# Patient Record
Sex: Male | Born: 1950 | Race: White | Hispanic: No | Marital: Single | Smoking: Former smoker
Health system: Southern US, Community
[De-identification: ages and names within clinical notes are randomized; demographics above are authoritative.]

## PROBLEM LIST (undated history)

## (undated) DIAGNOSIS — K219 Gastro-esophageal reflux disease without esophagitis: Secondary | ICD-10-CM

## (undated) DIAGNOSIS — K3189 Other diseases of stomach and duodenum: Secondary | ICD-10-CM

## (undated) DIAGNOSIS — Z720 Tobacco use: Secondary | ICD-10-CM

## (undated) DIAGNOSIS — K635 Polyp of colon: Secondary | ICD-10-CM

## (undated) DIAGNOSIS — D696 Thrombocytopenia, unspecified: Secondary | ICD-10-CM

## (undated) DIAGNOSIS — D649 Anemia, unspecified: Secondary | ICD-10-CM

## (undated) DIAGNOSIS — R519 Headache, unspecified: Secondary | ICD-10-CM

## (undated) DIAGNOSIS — K7460 Unspecified cirrhosis of liver: Secondary | ICD-10-CM

## (undated) DIAGNOSIS — I471 Supraventricular tachycardia, unspecified: Secondary | ICD-10-CM

## (undated) DIAGNOSIS — F1010 Alcohol abuse, uncomplicated: Secondary | ICD-10-CM

## (undated) DIAGNOSIS — E876 Hypokalemia: Secondary | ICD-10-CM

## (undated) DIAGNOSIS — Z9289 Personal history of other medical treatment: Secondary | ICD-10-CM

## (undated) DIAGNOSIS — R51 Headache: Secondary | ICD-10-CM

## (undated) DIAGNOSIS — K5520 Angiodysplasia of colon without hemorrhage: Secondary | ICD-10-CM

## (undated) DIAGNOSIS — K922 Gastrointestinal hemorrhage, unspecified: Secondary | ICD-10-CM

## (undated) DIAGNOSIS — I10 Essential (primary) hypertension: Secondary | ICD-10-CM

## (undated) DIAGNOSIS — M1990 Unspecified osteoarthritis, unspecified site: Secondary | ICD-10-CM

## (undated) DIAGNOSIS — Z8719 Personal history of other diseases of the digestive system: Secondary | ICD-10-CM

## (undated) DIAGNOSIS — J449 Chronic obstructive pulmonary disease, unspecified: Secondary | ICD-10-CM

## (undated) DIAGNOSIS — K5730 Diverticulosis of large intestine without perforation or abscess without bleeding: Secondary | ICD-10-CM

## (undated) DIAGNOSIS — I5030 Unspecified diastolic (congestive) heart failure: Secondary | ICD-10-CM

## (undated) DIAGNOSIS — K648 Other hemorrhoids: Secondary | ICD-10-CM

## (undated) DIAGNOSIS — K766 Portal hypertension: Secondary | ICD-10-CM

## (undated) HISTORY — PX: HERNIA REPAIR: SHX51

## (undated) HISTORY — PX: CATARACT EXTRACTION: SUR2

---

## 2008-12-09 ENCOUNTER — Encounter: Admission: RE | Admit: 2008-12-09 | Discharge: 2008-12-09 | Payer: Self-pay | Admitting: Family Medicine

## 2008-12-17 ENCOUNTER — Encounter: Admission: RE | Admit: 2008-12-17 | Discharge: 2008-12-17 | Payer: Self-pay | Admitting: Orthopedic Surgery

## 2010-08-29 ENCOUNTER — Ambulatory Visit (HOSPITAL_COMMUNITY): Admission: RE | Admit: 2010-08-29 | Discharge: 2010-08-29 | Payer: Self-pay | Admitting: General Surgery

## 2010-12-24 ENCOUNTER — Encounter: Payer: Self-pay | Admitting: Family Medicine

## 2011-02-15 LAB — CBC
HCT: 49.5 % (ref 39.0–52.0)
Hemoglobin: 17.2 g/dL — ABNORMAL HIGH (ref 13.0–17.0)
MCH: 32.8 pg (ref 26.0–34.0)
MCHC: 34.7 g/dL (ref 30.0–36.0)
RDW: 12.8 % (ref 11.5–15.5)

## 2011-02-15 LAB — BASIC METABOLIC PANEL
BUN: 12 mg/dL (ref 6–23)
CO2: 28 mEq/L (ref 19–32)
Calcium: 9.6 mg/dL (ref 8.4–10.5)
Glucose, Bld: 97 mg/dL (ref 70–99)
Potassium: 4.3 mEq/L (ref 3.5–5.1)
Sodium: 138 mEq/L (ref 135–145)

## 2011-02-15 LAB — SURGICAL PCR SCREEN: Staphylococcus aureus: NEGATIVE

## 2015-07-11 ENCOUNTER — Encounter (HOSPITAL_COMMUNITY): Payer: Self-pay | Admitting: Emergency Medicine

## 2015-07-11 ENCOUNTER — Other Ambulatory Visit: Payer: Self-pay | Admitting: Cardiology

## 2015-07-11 ENCOUNTER — Emergency Department (HOSPITAL_COMMUNITY): Payer: Medicare Other

## 2015-07-11 ENCOUNTER — Inpatient Hospital Stay (HOSPITAL_COMMUNITY)
Admission: EM | Admit: 2015-07-11 | Discharge: 2015-07-17 | DRG: 292 | Disposition: A | Discharge status: Home / Self Care | Payer: Medicare Other | Attending: Cardiology | Admitting: Cardiology

## 2015-07-11 DIAGNOSIS — I471 Supraventricular tachycardia, unspecified: Secondary | ICD-10-CM | POA: Diagnosis present

## 2015-07-11 DIAGNOSIS — IMO0002 Reserved for concepts with insufficient information to code with codable children: Secondary | ICD-10-CM | POA: Diagnosis present

## 2015-07-11 DIAGNOSIS — K648 Other hemorrhoids: Secondary | ICD-10-CM | POA: Diagnosis present

## 2015-07-11 DIAGNOSIS — Z87891 Personal history of nicotine dependence: Secondary | ICD-10-CM | POA: Diagnosis not present

## 2015-07-11 DIAGNOSIS — D124 Benign neoplasm of descending colon: Secondary | ICD-10-CM | POA: Diagnosis present

## 2015-07-11 DIAGNOSIS — K5520 Angiodysplasia of colon without hemorrhage: Secondary | ICD-10-CM

## 2015-07-11 DIAGNOSIS — M79672 Pain in left foot: Secondary | ICD-10-CM

## 2015-07-11 DIAGNOSIS — R609 Edema, unspecified: Secondary | ICD-10-CM | POA: Diagnosis not present

## 2015-07-11 DIAGNOSIS — K7460 Unspecified cirrhosis of liver: Secondary | ICD-10-CM

## 2015-07-11 DIAGNOSIS — K766 Portal hypertension: Secondary | ICD-10-CM | POA: Diagnosis present

## 2015-07-11 DIAGNOSIS — K219 Gastro-esophageal reflux disease without esophagitis: Secondary | ICD-10-CM | POA: Diagnosis present

## 2015-07-11 DIAGNOSIS — K5730 Diverticulosis of large intestine without perforation or abscess without bleeding: Secondary | ICD-10-CM | POA: Diagnosis present

## 2015-07-11 DIAGNOSIS — I509 Heart failure, unspecified: Secondary | ICD-10-CM

## 2015-07-11 DIAGNOSIS — D500 Iron deficiency anemia secondary to blood loss (chronic): Secondary | ICD-10-CM | POA: Diagnosis present

## 2015-07-11 DIAGNOSIS — E876 Hypokalemia: Secondary | ICD-10-CM | POA: Diagnosis present

## 2015-07-11 DIAGNOSIS — R0600 Dyspnea, unspecified: Secondary | ICD-10-CM | POA: Diagnosis not present

## 2015-07-11 DIAGNOSIS — D125 Benign neoplasm of sigmoid colon: Secondary | ICD-10-CM | POA: Diagnosis present

## 2015-07-11 DIAGNOSIS — I10 Essential (primary) hypertension: Secondary | ICD-10-CM | POA: Diagnosis present

## 2015-07-11 DIAGNOSIS — K7031 Alcoholic cirrhosis of liver with ascites: Secondary | ICD-10-CM | POA: Diagnosis present

## 2015-07-11 DIAGNOSIS — K3189 Other diseases of stomach and duodenum: Secondary | ICD-10-CM | POA: Diagnosis present

## 2015-07-11 DIAGNOSIS — I5033 Acute on chronic diastolic (congestive) heart failure: Principal | ICD-10-CM | POA: Diagnosis present

## 2015-07-11 DIAGNOSIS — F17200 Nicotine dependence, unspecified, uncomplicated: Secondary | ICD-10-CM | POA: Diagnosis present

## 2015-07-11 DIAGNOSIS — J449 Chronic obstructive pulmonary disease, unspecified: Secondary | ICD-10-CM | POA: Diagnosis present

## 2015-07-11 DIAGNOSIS — D696 Thrombocytopenia, unspecified: Secondary | ICD-10-CM | POA: Diagnosis present

## 2015-07-11 DIAGNOSIS — M7989 Other specified soft tissue disorders: Secondary | ICD-10-CM | POA: Diagnosis not present

## 2015-07-11 DIAGNOSIS — Q2733 Arteriovenous malformation of digestive system vessel: Secondary | ICD-10-CM

## 2015-07-11 DIAGNOSIS — R0602 Shortness of breath: Secondary | ICD-10-CM | POA: Diagnosis present

## 2015-07-11 DIAGNOSIS — F1090 Alcohol use, unspecified, uncomplicated: Secondary | ICD-10-CM | POA: Diagnosis present

## 2015-07-11 HISTORY — DX: Angiodysplasia of colon without hemorrhage: K55.20

## 2015-07-11 HISTORY — DX: Supraventricular tachycardia: I47.1

## 2015-07-11 HISTORY — DX: Other hemorrhoids: K64.8

## 2015-07-11 HISTORY — DX: Diverticulosis of large intestine without perforation or abscess without bleeding: K57.30

## 2015-07-11 HISTORY — DX: Polyp of colon: K63.5

## 2015-07-11 HISTORY — DX: Tobacco use: Z72.0

## 2015-07-11 HISTORY — DX: Essential (primary) hypertension: I10

## 2015-07-11 HISTORY — DX: Portal hypertension: K76.6

## 2015-07-11 HISTORY — DX: Chronic obstructive pulmonary disease, unspecified: J44.9

## 2015-07-11 HISTORY — DX: Alcohol abuse, uncomplicated: F10.10

## 2015-07-11 HISTORY — DX: Anemia, unspecified: D64.9

## 2015-07-11 HISTORY — DX: Unspecified diastolic (congestive) heart failure: I50.30

## 2015-07-11 HISTORY — DX: Other diseases of stomach and duodenum: K31.89

## 2015-07-11 HISTORY — DX: Supraventricular tachycardia, unspecified: I47.10

## 2015-07-11 HISTORY — DX: Hypokalemia: E87.6

## 2015-07-11 HISTORY — DX: Unspecified cirrhosis of liver: K74.60

## 2015-07-11 HISTORY — DX: Gastro-esophageal reflux disease without esophagitis: K21.9

## 2015-07-11 HISTORY — DX: Thrombocytopenia, unspecified: D69.6

## 2015-07-11 LAB — CBC WITH DIFFERENTIAL/PLATELET
BASOS ABS: 0.1 10*3/uL (ref 0.0–0.1)
BASOS PCT: 1 % (ref 0–1)
BASOS PCT: 1 % (ref 0–1)
Basophils Absolute: 0.1 10*3/uL (ref 0.0–0.1)
EOS ABS: 0.2 10*3/uL (ref 0.0–0.7)
Eosinophils Absolute: 0.2 10*3/uL (ref 0.0–0.7)
Eosinophils Relative: 2 % (ref 0–5)
Eosinophils Relative: 2 % (ref 0–5)
HCT: 26.7 % — ABNORMAL LOW (ref 39.0–52.0)
HCT: 24.4 % — ABNORMAL LOW (ref 39.0–52.0)
HEMOGLOBIN: 7.3 g/dL — AB (ref 13.0–17.0)
Hemoglobin: 7.9 g/dL — ABNORMAL LOW (ref 13.0–17.0)
LYMPHS ABS: 1 10*3/uL (ref 0.7–4.0)
LYMPHS PCT: 12 % (ref 12–46)
Lymphocytes Relative: 16 % (ref 12–46)
Lymphs Abs: 1.5 10*3/uL (ref 0.7–4.0)
MCH: 20.9 pg — ABNORMAL LOW (ref 26.0–34.0)
MCH: 20.7 pg — AB (ref 26.0–34.0)
MCHC: 29.6 g/dL — AB (ref 30.0–36.0)
MCHC: 29.9 g/dL — ABNORMAL LOW (ref 30.0–36.0)
MCV: 70.6 fL — ABNORMAL LOW (ref 78.0–100.0)
MCV: 69.3 fL — ABNORMAL LOW (ref 78.0–100.0)
MONO ABS: 1.4 10*3/uL — AB (ref 0.1–1.0)
Monocytes Absolute: 1.1 10*3/uL — ABNORMAL HIGH (ref 0.1–1.0)
Monocytes Relative: 15 % — ABNORMAL HIGH (ref 3–12)
Monocytes Relative: 13 % — ABNORMAL HIGH (ref 3–12)
NEUTROS ABS: 6.1 10*3/uL (ref 1.7–7.7)
NEUTROS PCT: 72 % (ref 43–77)
Neutro Abs: 5.8 10*3/uL (ref 1.7–7.7)
Neutrophils Relative %: 66 % (ref 43–77)
PLATELETS: 153 10*3/uL (ref 150–400)
PLATELETS: 142 10*3/uL — AB (ref 150–400)
RBC: 3.78 MIL/uL — AB (ref 4.22–5.81)
RBC: 3.52 MIL/uL — ABNORMAL LOW (ref 4.22–5.81)
RDW: 18.2 % — ABNORMAL HIGH (ref 11.5–15.5)
RDW: 18.2 % — AB (ref 11.5–15.5)
WBC: 9.3 10*3/uL (ref 4.0–10.5)
WBC: 8.2 10*3/uL (ref 4.0–10.5)

## 2015-07-11 LAB — I-STAT TROPONIN, ED: TROPONIN I, POC: 0.2 ng/mL — AB (ref 0.00–0.08)

## 2015-07-11 LAB — RETICULOCYTES
RBC.: 3.77 MIL/uL — ABNORMAL LOW (ref 4.22–5.81)
RETIC COUNT ABSOLUTE: 79.2 10*3/uL (ref 19.0–186.0)
Retic Ct Pct: 2.1 % (ref 0.4–3.1)

## 2015-07-11 LAB — IRON AND TIBC
Iron: 13 ug/dL — ABNORMAL LOW (ref 45–182)
SATURATION RATIOS: 3 % — AB (ref 17.9–39.5)
TIBC: 386 ug/dL (ref 250–450)
UIBC: 373 ug/dL

## 2015-07-11 LAB — COMPREHENSIVE METABOLIC PANEL
ALT: 27 U/L (ref 17–63)
ANION GAP: 11 (ref 5–15)
AST: 54 U/L — ABNORMAL HIGH (ref 15–41)
Albumin: 2.7 g/dL — ABNORMAL LOW (ref 3.5–5.0)
Alkaline Phosphatase: 104 U/L (ref 38–126)
BILIRUBIN TOTAL: 2.1 mg/dL — AB (ref 0.3–1.2)
BUN: 7 mg/dL (ref 6–20)
CHLORIDE: 95 mmol/L — AB (ref 101–111)
CO2: 28 mmol/L (ref 22–32)
CREATININE: 1.19 mg/dL (ref 0.61–1.24)
Calcium: 8.2 mg/dL — ABNORMAL LOW (ref 8.9–10.3)
GFR calc Af Amer: >60 mL/min (ref >60)
GFR calc non Af Amer: >60 mL/min (ref >60)
Glucose, Bld: 210 mg/dL — ABNORMAL HIGH (ref 65–99)
POTASSIUM: 2.5 mmol/L — AB (ref 3.5–5.1)
SODIUM: 134 mmol/L — AB (ref 135–145)
Total Protein: 5.6 g/dL — ABNORMAL LOW (ref 6.5–8.1)

## 2015-07-11 LAB — CREATININE, SERUM
Creatinine, Ser: 1.04 mg/dL (ref 0.61–1.24)
GFR calc non Af Amer: >60 mL/min (ref >60)

## 2015-07-11 LAB — FERRITIN: Ferritin: 20 ng/mL — ABNORMAL LOW (ref 24–336)

## 2015-07-11 LAB — BASIC METABOLIC PANEL
Anion gap: 15 (ref 5–15)
BUN: 8 mg/dL (ref 6–20)
CHLORIDE: 96 mmol/L — AB (ref 101–111)
CO2: 25 mmol/L (ref 22–32)
CREATININE: 1.05 mg/dL (ref 0.61–1.24)
Calcium: 8.5 mg/dL — ABNORMAL LOW (ref 8.9–10.3)
GFR calc Af Amer: >60 mL/min (ref >60)
GFR calc non Af Amer: >60 mL/min (ref >60)
Glucose, Bld: 122 mg/dL — ABNORMAL HIGH (ref 65–99)
Potassium: 3.3 mmol/L — ABNORMAL LOW (ref 3.5–5.1)
Sodium: 136 mmol/L (ref 135–145)

## 2015-07-11 LAB — BRAIN NATRIURETIC PEPTIDE
B Natriuretic Peptide: 327 pg/mL — ABNORMAL HIGH (ref 0.0–100.0)
B Natriuretic Peptide: 331.3 pg/mL — ABNORMAL HIGH (ref 0.0–100.0)

## 2015-07-11 LAB — FOLATE: FOLATE: 11.1 ng/mL (ref >5.9)

## 2015-07-11 LAB — VITAMIN B12: Vitamin B-12: 672 pg/mL (ref 180–914)

## 2015-07-11 LAB — TSH: TSH: 1.984 u[IU]/mL (ref 0.350–4.500)

## 2015-07-11 MED ORDER — SODIUM CHLORIDE 0.9 % IJ SOLN: 3.0000 mL | INTRAMUSCULAR | Status: DC | PRN

## 2015-07-11 MED ORDER — POTASSIUM CHLORIDE ER 10 MEQ PO TBCR
40.0000 meq | EXTENDED_RELEASE_TABLET | ORAL | Status: AC
  Administered 2015-07-11 – 2015-07-12 (×2): 40 meq via ORAL
  Filled 2015-07-11 (×2): qty 4

## 2015-07-11 MED ORDER — PNEUMOCOCCAL VAC POLYVALENT 25 MCG/0.5ML IJ INJ
0.5000 mL | INJECTION | INTRAMUSCULAR | Status: AC
Start: 2015-07-12 — End: 2015-07-12
  Administered 2015-07-12: 0.5 mL via INTRAMUSCULAR
  Filled 2015-07-11: qty 0.5

## 2015-07-11 MED ORDER — ALUM & MAG HYDROXIDE-SIMETH 200-200-20 MG/5ML PO SUSP: 15.0000 mL | Freq: Four times a day (QID) | ORAL | Status: DC | PRN

## 2015-07-11 MED ORDER — SODIUM CHLORIDE 0.9 % IJ SOLN
3.0000 mL | Freq: Two times a day (BID) | INTRAMUSCULAR | Status: DC
  Administered 2015-07-11 – 2015-07-16 (×11): 3 mL via INTRAVENOUS

## 2015-07-11 MED ORDER — ENOXAPARIN SODIUM 40 MG/0.4ML ~~LOC~~ SOLN
40.0000 mg | SUBCUTANEOUS | Status: DC
  Administered 2015-07-11 – 2015-07-13 (×3): 40 mg via SUBCUTANEOUS
  Filled 2015-07-11 (×4): qty 0.4

## 2015-07-11 MED ORDER — FUROSEMIDE 10 MG/ML IJ SOLN
40.0000 mg | Freq: Two times a day (BID) | INTRAMUSCULAR | Status: DC
  Administered 2015-07-11 – 2015-07-13 (×5): 40 mg via INTRAVENOUS
  Filled 2015-07-11 (×6): qty 4

## 2015-07-11 MED ORDER — POTASSIUM CHLORIDE 10 MEQ/100ML IV SOLN
10.0000 meq | INTRAVENOUS | Status: AC
  Administered 2015-07-11 – 2015-07-12 (×2): 10 meq via INTRAVENOUS
  Filled 2015-07-11 (×2): qty 100

## 2015-07-11 MED ORDER — ACETAMINOPHEN 325 MG PO TABS: 650.0000 mg | ORAL_TABLET | ORAL | Status: DC | PRN

## 2015-07-11 MED ORDER — SODIUM CHLORIDE 0.9 % IV SOLN: 250.0000 mL | INTRAVENOUS | Status: DC | PRN

## 2015-07-11 MED ORDER — ONDANSETRON HCL 4 MG/2ML IJ SOLN: 4.0000 mg | Freq: Four times a day (QID) | INTRAMUSCULAR | Status: DC | PRN

## 2015-07-11 NOTE — Progress Notes (Signed)
CRITICAL VALUE ALERT  Critical value received:  Potassium=2.5  Date of notification:  07/11/15  Time of notification:  22:25  Critical value read back: yes  Nurse who received alert: G. Cecil Vandyke  MD notified (1st page):  Dr. Alejandro Mulling  Time of first page: 22:35  MD notified (2nd page):  Time of second page:  Responding MD:  Dr. Claiborne Billings  Time MD responded:  22:50

## 2015-07-11 NOTE — Progress Notes (Signed)
X-Cover  Called with potassium of 2.5 in setting of quick diuresis (1+ L output from ER). With creatinine 1.1. Will give 40 meq PO x2 (80 total meq) and 20 meq IV over 2 hours for 100 meq total. Then repeat BMP. Anticipate will need further potassium. He's having some runs of SVT/tachycardia.   Jules Husbands, MD

## 2015-07-11 NOTE — ED Notes (Signed)
Dr Wynonia Lawman called and sts saw pt in office and is aware of troponin; EDP aware

## 2015-07-11 NOTE — H&P (Signed)
John Warren  Date of visit:  07/11/2015 DOB:  08-28-1951    Age:  64 yrs. Medical record number:  [id number]     Account number:  [id number] Primary Care Provider: Claris Gower ____________________________ CURRENT DIAGNOSES  1. Dyspnea  2. Chest pain  3. Dyspnea, unspecified ____________________________ ALLERGIES  Penicillins, Intolerance-unknown ____________________________ MEDICATIONS  1. furosemide 80 mg tablet, 1 p.o. daily  2. Nitrostat 0.4 mg sublingual tablet, PRN  3. Klor-Con M20 mEq tablet,extended release, 1 p.o. daily  4. omeprazole 40 mg capsule,delayed release, 1 p.o. daily ____________________________ CHIEF COMPLAINTS  Dyspnea  ankle edema ____________________________ HISTORY OF PRESENT ILLNESS  This 64 year old male is seen for evaluation of dyspnea and ankle edema. The patient has a several week history of progressive dyspnea with exertion. He previously was able to cut his grass but he has been unable to work or exercise and is dyspneic with less than a level of activity. He began to have edema as well as PND and saw his primary doctor last week. He was found to have a hemoglobin of 7.2 and was placed on furosemide. The patient has also noted some melena and some intermittent blood in his stools. He has lost about 30 pounds of weight according to him. He has also had some chest discomfort sometimes with exertion sometimes not. He was seen in the office today and was noted to be severely dyspneic and tachycardic with significant edema despite being on outpatient diuretics and is brought into the hospital for further evaluation of the above complaints. ____________________________ PAST HISTORY  Past Medical Illnesses:  hypertension, GERD;  Cardiovascular Illnesses:  no previous history of cardiac disease.;  Surgical Procedures:  cataract extraction OU, inguinal herniorrhaphy-rt;  Cardiology Procedures-Invasive:  no history of prior cardiac procedures;  Cardiology  Procedures-Noninvasive:  treadmill;  LVEF not documented,   ____________________________ CARDIO-PULMONARY TEST DATES EKG Date:  07/11/2015;   ____________________________ FAMILY HISTORY Father -- Father dead, Pulmonary embolism Mother -- Mother dead, Malignant neoplasm of liver Sister -- Sister alive with problem ____________________________ SOCIAL HISTORY Alcohol Use:  1pt whiskey qd 3 beer q week;  Smoking:  used to smoke but quit 2009;  Diet:  regular diet;  Lifestyle:  divorced;  Exercise:  no regular exercise;  Occupation:  retired Games developer;  Residence:  lives with girlfriend;   ____________________________ REVIEW OF SYSTEMS General:  obesity, weight loss of approximately 30 lbs  Integumentary:easy bruisability Eyes: cataract extraction bilaterally, denies diplopia, glaucoma or visual field defects. Ears, Nose, Throat, Mouth:  denies any hearing loss, epistaxis, hoarseness or difficulty speaking. Respiratory: dyspnea with exertion Cardiovascular:  please review HPI Abdominal: melena Genitourinary-Male: nocturia  Musculoskeletal:  denies arthritis, venous insufficiency, or muscle weakness Neurological:  numnbess and tingling of hand Psychiatric:  denies depression or anxiety  ____________________________ PHYSICAL EXAMINATION VITAL SIGNS  Blood Pressure:  104/70 Sitting, Right arm, regular cuff  , 114/70 Standing, Right arm and regular cuff   Pulse:  114/min. Weight:  213.00 lbs. Height:  74"BMI: 27  Constitutional:  pleasant white male in no acute distress, moderately obese Skin:  warm and dry to touch, no apparent skin lesions, or masses noted. Head:  normocephalic, balding male hair pattern Eyes:  EOMS Intact, PERRLA, C and S clear, Funduscopic exam not done. ENT:  ears, nose and throat reveal no gross abnormalities.  Dentition good. Neck:  supple, without massess. No JVD, thyromegaly or carotid bruits. Carotid upstroke normal. Chest:  normal symmetry, clear to  auscultation. Cardiac:  regular  rhythm, frequent premature beats, normal S1 and S2, no S3 or S4, grade 2/6 systolic murmur Abdomen:  abdomen soft,non-tender, no masses, no hepatospenomegaly, or aneurysm noted, moderately obese Peripheral Pulses:  pedal pulses difficult to feel  due to edema Extremities & Back:  3+ edema, bilateral venous insufficiency changes present Neurological:  no gross motor or sensory deficits noted, affect appropriate, oriented x3. ____________________________ IMPRESSIONS/PLAN 1. Dyspnea and edema with clinical congestive heart failure on examination and volume overload 2. Severe anemia possible iron deficiency 3. Hypertension 4. Chest pain possible angina 5. History of reflux  Recommendations:  The patient is brought into the hospital for evaluation and treatment.  Suspect significant CHF unclear of contribution of anemia.  He will have intravenous Lasix, a chest x-ray, lab work, and we'll start with an echocardiogram. Likely will need GI consultation because of the anemia. ____________________________ TODAYS ORDERS  1. 12 Lead EKG: Today  2. Admit to hospital  see orders.                       ____________________________ Cardiology Physician:  Kerry Hough MD [facility]

## 2015-07-11 NOTE — ED Notes (Signed)
This RN advised pt of high falls risk and asked pt to not get up to urinate without staff member at bedside.  This RN came in room to fins pt OOB using urinal.  This RN asked again that pt call for atand by assist when OOB.  Pt acknowledged.

## 2015-07-11 NOTE — ED Notes (Signed)
NOTIFIED J.BRANCH ,RN @ TRIAGE OF PATIENTS LAB RESULTS OF I-STAT TROPONIN @17 :26 PM.

## 2015-07-11 NOTE — Progress Notes (Signed)
Admitted pt to rm 3E23 from ED, pt alert and oriented, denied pain at this time, oriented to room, call bell placed within reach, admission orders carried out. Will continue to monitor.

## 2015-07-11 NOTE — Progress Notes (Signed)
Pt's potassium is 2.5, pt having runs of SVT, pt resting in bed asymptomatic. Dr. Claiborne Billings notified, orders for PO and IV potassium ordered.

## 2015-07-11 NOTE — ED Provider Notes (Addendum)
Medical screening exam:  Patient sent to the emergency department while awaiting a bed for direct admission. Patient sent by cardiology for admission for evaluation of elevated troponin and swelling. Patient has admission orders written, awaiting bed availability upstairs.  Patient was evaluated. He is comfortable without complaints. Examination is unremarkable other than significant swelling, does not appear significantly dyspneic or hypoxic.  Face to face Exam: HEENT - PERRLA Lungs - CTAB Heart - RRR, no M/R/G Abd - S/NT/ND Neuro - alert, oriented x3 Musculoskeletal - significant lower extremity edema bilaterally   Plan: Patient admitted by cardiology   Orpah Greek, MD 07/11/15 [place], MD 08/29/15 [phone]

## 2015-07-11 NOTE — ED Notes (Addendum)
Pt sent here by PCP for admission but no beds available; pt with fluid retention started on lasix last week with some results; pt sts swelling in feet; pt sts SOB; pt sts anemic and having some dark stools intermittently

## 2015-07-12 ENCOUNTER — Ambulatory Visit (HOSPITAL_COMMUNITY): Payer: Medicare Other

## 2015-07-12 ENCOUNTER — Inpatient Hospital Stay (HOSPITAL_COMMUNITY): Payer: Medicare Other

## 2015-07-12 ENCOUNTER — Encounter (HOSPITAL_COMMUNITY): Payer: Self-pay | Admitting: Cardiology

## 2015-07-12 DIAGNOSIS — I5033 Acute on chronic diastolic (congestive) heart failure: Secondary | ICD-10-CM | POA: Diagnosis not present

## 2015-07-12 DIAGNOSIS — R0600 Dyspnea, unspecified: Secondary | ICD-10-CM

## 2015-07-12 LAB — BASIC METABOLIC PANEL
ANION GAP: 8 (ref 5–15)
BUN: 10 mg/dL (ref 6–20)
CHLORIDE: 99 mmol/L — AB (ref 101–111)
CO2: 28 mmol/L (ref 22–32)
CREATININE: 1.18 mg/dL (ref 0.61–1.24)
Calcium: 8.4 mg/dL — ABNORMAL LOW (ref 8.9–10.3)
GFR calc Af Amer: >60 mL/min (ref >60)
Glucose, Bld: 137 mg/dL — ABNORMAL HIGH (ref 65–99)
POTASSIUM: 3.2 mmol/L — AB (ref 3.5–5.1)
SODIUM: 135 mmol/L (ref 135–145)

## 2015-07-12 LAB — URINALYSIS, ROUTINE W REFLEX MICROSCOPIC
Bilirubin Urine: NEGATIVE
GLUCOSE, UA: NEGATIVE mg/dL
Hgb urine dipstick: NEGATIVE
KETONES UR: NEGATIVE mg/dL
Leukocytes, UA: NEGATIVE
NITRITE: NEGATIVE
PH: 7 (ref 5.0–8.0)
Protein, ur: NEGATIVE mg/dL
Specific Gravity, Urine: 1.012 (ref 1.005–1.030)
Urobilinogen, UA: 1 mg/dL (ref 0.0–1.0)

## 2015-07-12 LAB — PREPARE RBC (CROSSMATCH)

## 2015-07-12 LAB — ABO/RH: ABO/RH(D): O POS

## 2015-07-12 MED ORDER — SODIUM CHLORIDE 0.9 % IV SOLN
Freq: Once | INTRAVENOUS | Status: AC
  Administered 2015-07-12: 09:00:00 via INTRAVENOUS

## 2015-07-12 MED ORDER — POTASSIUM CHLORIDE ER 10 MEQ PO TBCR
40.0000 meq | EXTENDED_RELEASE_TABLET | ORAL | Status: AC
  Administered 2015-07-12 (×2): 40 meq via ORAL
  Filled 2015-07-12 (×2): qty 4

## 2015-07-12 MED ORDER — FERROUS SULFATE 325 (65 FE) MG PO TABS
325.0000 mg | ORAL_TABLET | Freq: Three times a day (TID) | ORAL | Status: DC
  Administered 2015-07-12 – 2015-07-13 (×5): 325 mg via ORAL
  Filled 2015-07-12 (×6): qty 1

## 2015-07-12 MED ORDER — SPIRONOLACTONE 25 MG PO TABS
25.0000 mg | ORAL_TABLET | Freq: Every day | ORAL | Status: DC
  Administered 2015-07-12 – 2015-07-17 (×6): 25 mg via ORAL
  Filled 2015-07-12 (×6): qty 1

## 2015-07-12 NOTE — Progress Notes (Addendum)
   07/12/15 0232  Vitals  Temp 98.6 F (37 C)  Temp Source Oral  BP 115/60 mmHg  MAP (mmHg) 73  BP Location Right Arm  BP Method Automatic  Patient Position (if appropriate) Sitting  Pulse Rate (!) 121  Pulse Rate Source Dinamap  Oxygen Therapy  SpO2 97 %  O2 Device Room Air  Pt's HR in the 120's-140's non sustained, pt resting in bed. EKG done showing sinus tachycardia with premature supraventricular complexes. Dr. Claiborne Billings notified, awaiting orders at this time. Will continue to monitor.  Esdras Delair, Blanch Media, RN

## 2015-07-12 NOTE — Progress Notes (Signed)
  Echocardiogram 2D Echocardiogram has been performed.  Darlina Sicilian M 07/12/2015, 9:34 AM

## 2015-07-12 NOTE — Progress Notes (Signed)
Subjective:  He is still dyspneic with most any level of exertion.  No chest pain.  He had diarrhea a good bit of the night that he relates to potassium.  Urinating a lot.  Reported 7 pound weight loss overnight and has had good urine output thus far.  Further history reveals that he was drinking quite heavily about a pint a day an d also smoked quite heavily in the past  Objective:  Vital Signs in the last 24 hours: BP 103/54 mmHg  Pulse 119  Temp(Src) 98.2 F (36.8 C) (Oral)  Resp 22  Ht 6\' 3"  (1.905 m)  Wt 93.668 kg (206 lb 8 oz)  BMI 25.81 kg/m2  SpO2 94%  Physical Exam: Pleasant male in no acute distress Lungs:  Increased AP diameter, reduced breath sounds Cardiac:  Rapid regular  rhythm, normal S1 and S2, no S3, 2/6 murmur Abdomen:  Distended and Soft, nontender, no masses Extremities:  3+ edema present  Intake/Output from previous day: 08/08 0701 - 08/09 0700 In: 680 [P.O.:480; IV Piggyback:200] Out: 1330 [Urine:1330] Weight Filed Weights   07/11/15 1642 07/11/15 2103  Weight: 96.616 kg (213 lb) 93.668 kg (206 lb 8 oz)    Lab Results: Basic Metabolic Panel:  Recent Labs  07/11/15 2146 07/12/15 0305  NA 134* 135  K 2.5* 3.2*  CL 95* 99*  CO2 28 28  GLUCOSE 210* 137*  BUN 7 10  CREATININE 1.19 1.18   CBC:  Recent Labs  07/11/15 1700 07/11/15 2146  WBC 9.3 8.2  NEUTROABS 6.1 5.8  HGB 7.9* 7.3*  HCT 26.7* 24.4*  MCV 70.6* 69.3*  PLT 153 142*    BNP    Component Value Date/Time   BNP 331.3* 07/11/2015 2146   Telemetry:   Assessment/Plan:  1.  Severe symptomatic anemia 2.  Congestive heart failure unclear whether systolic or diastolic-heart size is normal on chest x-ray and echo is pending 3.  Significant alcohol intake wonder about cirrhosis as an underlying cause 4.  Hypokalemia continue replacement 5.  Iron deficiency anemia 6.  COPD  Recommendations:  Echo is pending at this time.  His BNP level was really not that high but is  elevated some.  We'll obtain abdominal ultrasound.  Have asked for GI consultation as well as repeat abdominal ultrasound.  Continue diuresis.  Add spironolactone to help with hypokalemia and replace potassium.  Transfuse one unit of packed red blood cells because he is symptomatic.      Kerry Hough  MD [facility]  07/12/2015, 8:24 AM

## 2015-07-12 NOTE — Progress Notes (Signed)
Pt's potassium level is 3.2 after KCL replacement, Dr. Claiborne Billings notified and additional order for potassium replacement obtained.

## 2015-07-12 NOTE — Consult Note (Signed)
Referring John Warren: Dr. Wynonia Lawman Primary Care Physician:  No primary care John Warren on file. Primary Gastroenterologist:  John Warren  Reason for Consultation:  Anemia  HPI: John Warren is a 64 y.o. male being seen for a consult due to anemia (Hgb 7.9) who was admitted for shortness of breath and LE edema. Being diuresed for CHF. Has been having melena recently and denies hematochezia. Denies hematemesis, nausea, vomiting, or abdominal pain. Reports occasional reflux until it worsened during the past several weeks. Denies dysphagia. Has been taking Pepcid and Alka-Seltzer daily for the worsened reflux. Was taking Ibuprofen 600 mg QD for months for left foot pain until stopping 2 weeks ago. Reports a normal colonoscopy 8-10 years ago and says an EGD was done as well but does not know the results. Reportedly has lost 30 pounds of weight. Denies iron pills or Pepto-Bismol.    Past Medical History  Diagnosis Date  . GERD (gastroesophageal reflux disease)   . Hypertension   . Anemia   . COPD (chronic obstructive pulmonary disease)     Past Surgical History  Procedure Laterality Date  . Cataract extraction    . Hernia repair      Prior to Admission medications   Medication Sig Start Date End Date Taking? Authorizing John Warren  furosemide (LASIX) 80 MG tablet Take 80 mg by mouth daily.   Yes Historical John Wayson, MD  nitroGLYCERIN (NITROSTAT) 0.4 MG SL tablet Place 0.4 mg under the tongue every 5 (five) minutes as needed for chest pain.   Yes Historical John Bord, MD  omeprazole (PRILOSEC) 40 MG capsule Take 40 mg by mouth every evening.   Yes Historical John Seckel, MD  potassium chloride SA (K-DUR,KLOR-CON) 20 MEQ tablet Take 20 mEq by mouth daily.   Yes Historical John Whittle, MD    Scheduled Meds: . sodium chloride   Intravenous Once  . enoxaparin (LOVENOX) injection  40 mg Subcutaneous Q24H  . ferrous sulfate  325 mg Oral TID WC  . furosemide  40 mg Intravenous Q12H  . pneumococcal 23  valent vaccine  0.5 mL Intramuscular Tomorrow-1000  . sodium chloride  3 mL Intravenous Q12H  . spironolactone  25 mg Oral Daily   Continuous Infusions:  PRN Meds:.sodium chloride, acetaminophen, alum & mag hydroxide-simeth, ondansetron (ZOFRAN) IV, sodium chloride  Allergies as of 07/11/2015 - Review Complete 07/11/2015  Allergen Reaction Noted  . Penicillins  07/11/2015    History reviewed. No pertinent family history.  History   Social History  . Marital Status: Single    Spouse Name: N/A  . Number of Children: N/A  . Years of Education: N/A   Occupational History  . Not on file.   Social History Main Topics  . Smoking status: Former Smoker -- 2.00 packs/day for 40 years    Types: Cigarettes  . Smokeless tobacco: Never Used  . Alcohol Use: 0.0 oz/week    0 Standard drinks or equivalent per week     Comment: 1 pint whiskey daily several beer weekly   . Drug Use: No  . Sexual Activity: Not on file   Other Topics Concern  . Not on file   Social History Narrative    Review of Systems: All negative except as stated above in HPI.  Physical Exam: Vital signs: Filed Vitals:   07/12/15 0608  BP: 103/54  Pulse: 119  Temp: 98.2 F (36.8 C)  Resp: 22   Last BM Date: 07/11/15 General:   Alert,  Well-developed, well-nourished, pleasant and cooperative in NAD Head:  atraumatic Eyes: anicteric ENT: oropharynx clear Neck: supple, nontender Lungs:  Coarse breath sounds Heart:  Regular rate and rhythm; no murmurs, clicks, rubs,  or gallops. Abdomen: soft, nontender, nondistended, +BS  Rectal:  Deferred Ext: 2+ pitting edema, left foot tenderness  GI:  Lab Results:  Recent Labs  07/11/15 1700 07/11/15 2146  WBC 9.3 8.2  HGB 7.9* 7.3*  HCT 26.7* 24.4*  PLT 153 142*   BMET  Recent Labs  07/11/15 1700 07/11/15 1822 07/11/15 2146 07/12/15 0305  NA 136  --  134* 135  K 3.3*  --  2.5* 3.2*  CL 96*  --  95* 99*  CO2 25  --  28 28  GLUCOSE 122*  --  210*  137*  BUN 8  --  7 10  CREATININE 1.05 1.04 1.19 1.18  CALCIUM 8.5*  --  8.2* 8.4*   LFT  Recent Labs  07/11/15 2146  PROT 5.6*  ALBUMIN 2.7*  AST 54*  ALT 27  ALKPHOS 104  BILITOT 2.1*   PT/INR No results for input(s): LABPROT, INR in the last 72 hours.   Studies/Results: Dg Chest 2 View  07/12/2015   CLINICAL DATA:  CHF, mild shortness of breath and productive cough, long history of tobacco use.  EXAM: CHEST  2 VIEW  COMPARISON:  Chest x-ray of July 11, 2015  FINDINGS: The lungs remain hyperinflated. The interstitial markings remain increased bilaterally. Small bilateral pleural effusions greater on the right than on the left persists. The heart is top-normal in size. The pulmonary vascularity is mildly prominent centrally. The bony thorax exhibits no acute abnormality.  IMPRESSION: COPD with superimposed CHF and interstitial edema and bilateral pleural effusions. There has not been significant interval change since yesterday's study.   Electronically Signed   By: David  Martinique M.D.   On: 07/12/2015 07:30   Dg Chest 2 View  07/11/2015   CLINICAL DATA:  Increased shortness of breath for 1 month  EXAM: CHEST  2 VIEW  COMPARISON:  08/25/2010  FINDINGS: There are bilateral small pleural effusions. There is diffuse bilateral mild interstitial thickening. There is no pneumothorax. There is no focal consolidation. There is right basilar atelectasis. The heart size is normal. There is thoracic aortic atherosclerosis. There is no acute osseous abnormality.  IMPRESSION: Bilateral interstitial thickening and bilateral small pleural effusions concerning for mild pulmonary edema.   Electronically Signed   By: Kathreen Devoid   On: 07/11/2015 17:25    Impression/Plan: 64 yo with volume overload from CHF being seen for a consult due to iron deficiency anemia and report of melena. Will need a GI evaluation when his CHF has stabilized. EGD/colonoscopy prior to discharge when volume overload has resolved.  Will be on stand-by to decide on timing of his GI procedures pending improvement in his volume overload. Please let us know when he is close to discharge so these procedures and prep can be arranged. Thank you for this consultation.   LOS: 1 day   [place] C.  07/12/2015, 9:01 AM  Pager [phone]  If no answer or after 5 PM call [phone]

## 2015-07-13 ENCOUNTER — Ambulatory Visit (HOSPITAL_COMMUNITY): Payer: Medicare Other

## 2015-07-13 ENCOUNTER — Inpatient Hospital Stay (HOSPITAL_COMMUNITY): Payer: Medicare Other

## 2015-07-13 DIAGNOSIS — J449 Chronic obstructive pulmonary disease, unspecified: Secondary | ICD-10-CM | POA: Diagnosis present

## 2015-07-13 LAB — C DIFFICILE QUICK SCREEN W PCR REFLEX
C Diff antigen: NEGATIVE
C Diff interpretation: NEGATIVE
C Diff toxin: NEGATIVE

## 2015-07-13 LAB — CBC
HCT: 26.9 % — ABNORMAL LOW (ref 39.0–52.0)
HEMOGLOBIN: 8.2 g/dL — AB (ref 13.0–17.0)
MCH: 21.4 pg — ABNORMAL LOW (ref 26.0–34.0)
MCHC: 30.5 g/dL (ref 30.0–36.0)
MCV: 70.1 fL — AB (ref 78.0–100.0)
PLATELETS: 131 10*3/uL — AB (ref 150–400)
RBC: 3.84 MIL/uL — AB (ref 4.22–5.81)
RDW: 18 % — AB (ref 11.5–15.5)
WBC: 8.2 10*3/uL (ref 4.0–10.5)

## 2015-07-13 LAB — BASIC METABOLIC PANEL
Anion gap: 10 (ref 5–15)
BUN: 10 mg/dL (ref 6–20)
CALCIUM: 8.3 mg/dL — AB (ref 8.9–10.3)
CO2: 28 mmol/L (ref 22–32)
CREATININE: 1.01 mg/dL (ref 0.61–1.24)
Chloride: 98 mmol/L — ABNORMAL LOW (ref 101–111)
GFR calc Af Amer: >60 mL/min (ref >60)
GLUCOSE: 106 mg/dL — AB (ref 65–99)
Potassium: 3.4 mmol/L — ABNORMAL LOW (ref 3.5–5.1)
SODIUM: 136 mmol/L (ref 135–145)

## 2015-07-13 LAB — TYPE AND SCREEN
ABO/RH(D): O POS
Antibody Screen: NEGATIVE
UNIT DIVISION: 0

## 2015-07-13 MED ORDER — FUROSEMIDE 10 MG/ML IJ SOLN
40.0000 mg | Freq: Two times a day (BID) | INTRAMUSCULAR | Status: DC
  Administered 2015-07-14 – 2015-07-17 (×7): 40 mg via INTRAVENOUS
  Filled 2015-07-13 (×10): qty 4

## 2015-07-13 MED ORDER — SODIUM CHLORIDE 0.9 % IV SOLN: INTRAVENOUS | Status: DC

## 2015-07-13 MED ORDER — POTASSIUM CHLORIDE 20 MEQ/15ML (10%) PO SOLN
40.0000 meq | Freq: Once | ORAL | Status: AC
  Administered 2015-07-13: 40 meq via ORAL
  Filled 2015-07-13: qty 30

## 2015-07-13 MED ORDER — FERROUS SULFATE 325 (65 FE) MG PO TABS
325.0000 mg | ORAL_TABLET | Freq: Three times a day (TID) | ORAL | Status: DC
  Administered 2015-07-14 – 2015-07-17 (×8): 325 mg via ORAL
  Filled 2015-07-13 (×13): qty 1

## 2015-07-13 MED ORDER — PEG 3350-KCL-NA BICARB-NACL 420 G PO SOLR
4000.0000 mL | Freq: Once | ORAL | Status: AC
  Administered 2015-07-14: 4000 mL via ORAL
  Filled 2015-07-13: qty 4000

## 2015-07-13 NOTE — Progress Notes (Signed)
Subjective:  Tolerated transfusion well yesterday.  Breathing is better today.  He continues to have some intermittent diarrhea.  Notes intermittent black stools.  Echocardiogram showed preserved EF of 55-60% so this is not systolic heart failure.  The edema may not even be due to his heart.  It may be due to hypoalbuminemia or anemia with high output failure.  He is currently diuresing.  No chest pain.  Objective:  Vital Signs in the last 24 hours: BP 109/58 mmHg  Pulse 102  Temp(Src) 98 F (36.7 C) (Oral)  Resp 18  Ht 6\' 3"  (1.905 m)  Wt 91.128 kg (200 lb 14.4 oz)  BMI 25.11 kg/m2  SpO2 94%  Physical Exam: Pleasant male in no acute distress Lungs:  Increased AP diameter, reduced breath sounds Cardiac:  Rapid regular  rhythm, normal S1 and S2, no S3, 2/6 murmur Abdomen:  Distended and Soft, nontender, no masses Extremities:  3+ edema present of the left leg, 2+ of the right leg  Intake/Output from previous day: 08/09 0701 - 08/10 0700 In: 1417.5 [P.O.:1050; I.V.:30; Blood:337.5] Out: 1682 [Urine:1675; Stool:7] Weight Filed Weights   07/11/15 1642 07/11/15 2103 07/13/15 0515  Weight: 96.616 kg (213 lb) 93.668 kg (206 lb 8 oz) 91.128 kg (200 lb 14.4 oz)    Lab Results: Basic Metabolic Panel:  Recent Labs  07/12/15 0305 07/13/15 0331  NA 135 136  K 3.2* 3.4*  CL 99* 98*  CO2 28 28  GLUCOSE 137* 106*  BUN 10 10  CREATININE 1.18 1.01   CBC:  Recent Labs  07/11/15 1700 07/11/15 2146  WBC 9.3 8.2  NEUTROABS 6.1 5.8  HGB 7.9* 7.3*  HCT 26.7* 24.4*  MCV 70.6* 69.3*  PLT 153 142*    BNP    Component Value Date/Time   BNP 331.3* 07/11/2015 2146   Telemetry:   Assessment/Plan:  1.  Severe symptomatic anemia 2.  Congestive heart failure could be high output due to anemia or diastolic.  It is typically not systolic.   3.  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  Hypokalemia continue replacement 5.  Iron deficiency anemia 6.   COPD  Recommendations:  He has diuresed 13 pounds since admission.  I'm not sure this soft heart failure but he does need diuresis.  Spironolactone was added yesterday.  I think from a cardiac viewpoint he can proceed with a GI evaluation.  Would appreciate the GI comment on the diarrhea.  Check repeat CBC following transfusion as well as lower extremity Dopplers to look for DVT.  His echocardiogram showed an EF of 55-60%.  Normal right ventricular function.      Kerry Hough  MD [facility]  07/13/2015, 8:52 AM

## 2015-07-13 NOTE — Progress Notes (Signed)
Patient ID: John Warren, male   DOB: 09/18/1951, 64 y.o.   MRN: [id number] [facility] Progress Note  John Warren 64 y.o. 1951/07/02   Subjective: Feels a lot better in terms of his shortness of breath. Having loose nonbloody stools that he was not having prior to admit.  Objective: Vital signs in last 24 hours: Filed Vitals:   07/13/15 0515  BP: 109/58  Pulse: 102  Temp: 98 F (36.7 C)  Resp: 18    Physical Exam: Gen: alert, no acute distress CV: RRR Chest: CTA B Abd: soft, nontender, nondistended, +BS Ext: 2+ LE edema  Lab Results:  Recent Labs  07/12/15 0305 07/13/15 0331  NA 135 136  K 3.2* 3.4*  CL 99* 98*  CO2 28 28  GLUCOSE 137* 106*  BUN 10 10  CREATININE 1.18 1.01  CALCIUM 8.4* 8.3*    Recent Labs  07/11/15 2146  AST 54*  ALT 27  ALKPHOS 104  BILITOT 2.1*  PROT 5.6*  ALBUMIN 2.7*    Recent Labs  07/11/15 1700 07/11/15 2146 07/13/15 0928  WBC 9.3 8.2 8.2  NEUTROABS 6.1 5.8  --   HGB 7.9* 7.3* 8.2*  HCT 26.7* 24.4* 26.9*  MCV 70.6* 69.3* 70.1*  PLT 153 142* 131*   No results for input(s): LABPROT, INR in the last 72 hours.    Assessment/Plan: Anemia with melena prior to admit and U/S showing cirrhosis in need of GI evaluation. Diarrhea since admit so will check C. Diff. Doubt ischemic colitis. EGD/colonoscopy Friday.   [place] C. 07/13/2015, 10:44 AM  Pager [phone]  If no answer or after 5 PM call [phone]

## 2015-07-14 ENCOUNTER — Ambulatory Visit (HOSPITAL_COMMUNITY): Payer: Medicare Other

## 2015-07-14 DIAGNOSIS — M7989 Other specified soft tissue disorders: Secondary | ICD-10-CM

## 2015-07-14 LAB — BASIC METABOLIC PANEL
ANION GAP: 8 (ref 5–15)
BUN: 9 mg/dL (ref 6–20)
CALCIUM: 8.2 mg/dL — AB (ref 8.9–10.3)
CO2: 30 mmol/L (ref 22–32)
Chloride: 96 mmol/L — ABNORMAL LOW (ref 101–111)
Creatinine, Ser: 0.91 mg/dL (ref 0.61–1.24)
GLUCOSE: 103 mg/dL — AB (ref 65–99)
Potassium: 2.8 mmol/L — ABNORMAL LOW (ref 3.5–5.1)
SODIUM: 134 mmol/L — AB (ref 135–145)

## 2015-07-14 LAB — LIPID PANEL
CHOLESTEROL: 134 mg/dL (ref 0–200)
HDL: 16 mg/dL — AB (ref >40)
LDL Cholesterol: 99 mg/dL (ref 0–99)
TRIGLYCERIDES: 93 mg/dL (ref <150)
Total CHOL/HDL Ratio: 8.4 RATIO
VLDL: 19 mg/dL (ref 0–40)

## 2015-07-14 LAB — CBC
HEMATOCRIT: 25.6 % — AB (ref 39.0–52.0)
Hemoglobin: 7.8 g/dL — ABNORMAL LOW (ref 13.0–17.0)
MCH: 21.5 pg — AB (ref 26.0–34.0)
MCHC: 30.5 g/dL (ref 30.0–36.0)
MCV: 70.5 fL — ABNORMAL LOW (ref 78.0–100.0)
Platelets: 121 10*3/uL — ABNORMAL LOW (ref 150–400)
RBC: 3.63 MIL/uL — ABNORMAL LOW (ref 4.22–5.81)
RDW: 18.4 % — AB (ref 11.5–15.5)
WBC: 6.8 10*3/uL (ref 4.0–10.5)

## 2015-07-14 MED ORDER — POTASSIUM CHLORIDE 20 MEQ/15ML (10%) PO SOLN
40.0000 meq | Freq: Four times a day (QID) | ORAL | Status: AC
  Administered 2015-07-14 (×3): 40 meq via ORAL
  Filled 2015-07-14 (×3): qty 30

## 2015-07-14 NOTE — Progress Notes (Signed)
Subjective:  Diarrhea is better. Dyspnea improved.  Some tachycardia when up in room.  No chest discomfort.  Plans are for evaluation of anemia tomorrow.  Weight loss of 14 pounds since admission with diuresis.  Objective:  Vital Signs in the last 24 hours: BP 101/53 mmHg  Pulse 99  Temp(Src) 98 F (36.7 C) (Oral)  Resp 18  Ht 6\' 3"  (1.905 m)  Wt 90.493 kg (199 lb 8 oz)  BMI 24.94 kg/m2  SpO2 97%  Physical Exam: Pleasant male in no acute distress Lungs:  Increased AP diameter, reduced breath sounds Cardiac:  Regular  rhythm, normal S1 and S2, no S3, 2/6 murmur Abdomen:  Distended and Soft, nontender, no masses Extremities:  3+ edema present of the left leg, 2+ of the right leg  Intake/Output from previous day: 08/10 0701 - 08/11 0700 In: 480 [P.O.:480] Out: 1550 [Urine:1550] Weight Filed Weights   07/11/15 2103 07/13/15 0515 07/14/15 0520  Weight: 93.668 kg (206 lb 8 oz) 91.128 kg (200 lb 14.4 oz) 90.493 kg (199 lb 8 oz)    Lab Results: Basic Metabolic Panel:  Recent Labs  07/13/15 0331 07/14/15 0516  NA 136 134*  K 3.4* 2.8*  CL 98* 96*  CO2 28 30  GLUCOSE 106* 103*  BUN 10 9  CREATININE 1.01 0.91   CBC:  Recent Labs  07/11/15 1700 07/11/15 2146 07/13/15 0928 07/14/15 0516  WBC 9.3 8.2 8.2 6.8  NEUTROABS 6.1 5.8  --   --   HGB 7.9* 7.3* 8.2* 7.8*  HCT 26.7* 24.4* 26.9* 25.6*  MCV 70.6* 69.3* 70.1* 70.5*  PLT 153 142* 131* 121*    BNP    Component Value Date/Time   BNP 331.3* 07/11/2015 2146   Telemetry:   Assessment/Plan:  1.  Severe symptomatic anemia 2.  Congestive heart failure could be high output due to anemia or diastolic.  It is not systolic  3.  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  Hypokalemia worse today continue replacement 5.  Iron deficiency anemia 6.  COPD  Recommendations:  Continue diuresis.  Prep for colonoscopy and upper endoscopy.  Still significantly anemic despite transfusion of one packed  cells but is improved clinically.  Replace potassium.  Check lipid panel.  Await lower extremity venous Dopplers.      Kerry Hough  MD [facility]  07/14/2015, 8:42 AM

## 2015-07-14 NOTE — Care Management Important Message (Signed)
Important Message  Patient Details  Name: John Warren MRN: [id number] Date of Birth: [birth year]   Medicare Important Message Given:  Yes-second notification given    Pricilla Handler 07/14/2015, 2:00 PM

## 2015-07-14 NOTE — Progress Notes (Signed)
VASCULAR LAB PRELIMINARY  PRELIMINARY  PRELIMINARY  PRELIMINARY  Bilateral lower extremity venous duplex completed.    Preliminary report:  Bilateral:  No evidence of DVT, superficial thrombosis, or Baker's Cyst.   Daymen Hassebrock, RVS 07/14/2015, 2:30 PM

## 2015-07-15 ENCOUNTER — Encounter (HOSPITAL_COMMUNITY): Payer: Self-pay | Admitting: *Deleted

## 2015-07-15 ENCOUNTER — Inpatient Hospital Stay (HOSPITAL_COMMUNITY): Payer: Medicare Other | Admitting: Certified Registered Nurse Anesthetist

## 2015-07-15 ENCOUNTER — Inpatient Hospital Stay (HOSPITAL_COMMUNITY): Payer: Medicare Other

## 2015-07-15 ENCOUNTER — Encounter (HOSPITAL_COMMUNITY)
Admission: EM | Disposition: A | Discharge status: Home / Self Care | Payer: Self-pay | Source: Home / Self Care | Attending: Cardiology

## 2015-07-15 HISTORY — PX: COLONOSCOPY: SHX5424

## 2015-07-15 HISTORY — PX: ESOPHAGOGASTRODUODENOSCOPY: SHX5428

## 2015-07-15 LAB — CBC
HCT: 27.4 % — ABNORMAL LOW (ref 39.0–52.0)
Hemoglobin: 8.1 g/dL — ABNORMAL LOW (ref 13.0–17.0)
MCH: 20.8 pg — ABNORMAL LOW (ref 26.0–34.0)
MCHC: 29.6 g/dL — AB (ref 30.0–36.0)
MCV: 70.4 fL — ABNORMAL LOW (ref 78.0–100.0)
PLATELETS: 136 10*3/uL — AB (ref 150–400)
RBC: 3.89 MIL/uL — ABNORMAL LOW (ref 4.22–5.81)
RDW: 18.6 % — AB (ref 11.5–15.5)
WBC: 5.5 10*3/uL (ref 4.0–10.5)

## 2015-07-15 LAB — BASIC METABOLIC PANEL
Anion gap: 9 (ref 5–15)
BUN: 7 mg/dL (ref 6–20)
CALCIUM: 8.3 mg/dL — AB (ref 8.9–10.3)
CO2: 27 mmol/L (ref 22–32)
CREATININE: 0.91 mg/dL (ref 0.61–1.24)
Chloride: 97 mmol/L — ABNORMAL LOW (ref 101–111)
GFR calc Af Amer: >60 mL/min (ref >60)
GFR calc non Af Amer: >60 mL/min (ref >60)
GLUCOSE: 90 mg/dL (ref 65–99)
POTASSIUM: 3.3 mmol/L — AB (ref 3.5–5.1)
Sodium: 133 mmol/L — ABNORMAL LOW (ref 135–145)

## 2015-07-15 SURGERY — EGD (ESOPHAGOGASTRODUODENOSCOPY)
Anesthesia: Monitor Anesthesia Care

## 2015-07-15 MED ORDER — PHENYLEPHRINE HCL 10 MG/ML IJ SOLN
INTRAMUSCULAR | Status: DC | PRN
  Administered 2015-07-15: 80 ug via INTRAVENOUS
  Administered 2015-07-15: 40 ug via INTRAVENOUS
  Administered 2015-07-15: 80 ug via INTRAVENOUS

## 2015-07-15 MED ORDER — PROPOFOL 10 MG/ML IV BOLUS
INTRAVENOUS | Status: DC | PRN
  Administered 2015-07-15: 20 mg via INTRAVENOUS

## 2015-07-15 MED ORDER — POTASSIUM CHLORIDE 20 MEQ/15ML (10%) PO SOLN
40.0000 meq | ORAL | Status: AC
  Administered 2015-07-15: 40 meq via ORAL
  Filled 2015-07-15 (×2): qty 30

## 2015-07-15 MED ORDER — PROPOFOL INFUSION 10 MG/ML OPTIME
INTRAVENOUS | Status: DC | PRN
  Administered 2015-07-15: 150 ug/kg/min via INTRAVENOUS

## 2015-07-15 MED ORDER — MIDAZOLAM HCL 5 MG/5ML IJ SOLN
INTRAMUSCULAR | Status: DC | PRN
  Administered 2015-07-15: 2 mg via INTRAVENOUS

## 2015-07-15 MED ORDER — POTASSIUM CHLORIDE 20 MEQ/15ML (10%) PO SOLN
40.0000 meq | Freq: Once | ORAL | Status: AC
  Administered 2015-07-15: 40 meq via ORAL
  Filled 2015-07-15: qty 30

## 2015-07-15 MED ORDER — LACTATED RINGERS IV SOLN
INTRAVENOUS | Status: DC
  Administered 2015-07-15: 12:00:00 via INTRAVENOUS
  Administered 2015-07-15: 1000 mL via INTRAVENOUS

## 2015-07-15 MED ORDER — MEPERIDINE HCL 25 MG/ML IJ SOLN: 6.2500 mg | INTRAMUSCULAR | Status: DC | PRN

## 2015-07-15 MED ORDER — PROMETHAZINE HCL 25 MG/ML IJ SOLN: 6.2500 mg | INTRAMUSCULAR | Status: DC | PRN

## 2015-07-15 MED ORDER — LACTATED RINGERS IV SOLN
INTRAVENOUS | Status: DC
  Administered 2015-07-15: 14:00:00 via INTRAVENOUS

## 2015-07-15 NOTE — Anesthesia Preprocedure Evaluation (Addendum)
Anesthesia Evaluation  Patient identified by MRN, date of birth, ID band Patient awake    Reviewed: Allergy & Precautions, NPO status , Patient's Chart, lab work & pertinent test results, reviewed documented beta blocker date and time   Airway Mallampati: III  TM Distance: >3 FB Neck ROM: Full    Dental  (+) Teeth Intact   Pulmonary COPDformer smoker,    + decreased breath sounds      Cardiovascular Exercise Tolerance: Poor hypertension, Pt. on medications +CHF Rhythm:Regular Rate:Normal     Neuro/Psych negative neurological ROS  negative psych ROS   GI/Hepatic Neg liver ROS, GERD-  Medicated,  Endo/Other  negative endocrine ROS  Renal/GU negative Renal ROS  negative genitourinary   Musculoskeletal negative musculoskeletal ROS (+)   Abdominal   Peds negative pediatric ROS (+)  Hematology negative hematology ROS (+)   Anesthesia Other Findings   Reproductive/Obstetrics negative OB ROS                            Lab Results  Component Value Date   WBC 5.5 07/15/2015   HGB 8.1* 07/15/2015   HCT 27.4* 07/15/2015   MCV 70.4* 07/15/2015   PLT 136* 07/15/2015   Lab Results  Component Value Date   CREATININE 0.91 07/15/2015   BUN 7 07/15/2015   NA 133* 07/15/2015   K 3.3* 07/15/2015   CL 97* 07/15/2015   CO2 27 07/15/2015   No results found for: INR, PROTIME  Anesthesia Physical Anesthesia Plan  ASA: II  Anesthesia Plan: MAC   Post-op Pain Management:    Induction: Intravenous  Airway Management Planned: Natural Airway  Additional Equipment:   Intra-op Plan:   Post-operative Plan:   Informed Consent: I have reviewed the patients History and Physical, chart, labs and discussed the procedure including the risks, benefits and alternatives for the proposed anesthesia with the patient or authorized representative who has indicated his/her understanding and acceptance.    Dental advisory given  Plan Discussed with: CRNA  Anesthesia Plan Comments:         Anesthesia Quick Evaluation

## 2015-07-15 NOTE — Op Note (Signed)
[facility] [place], [address]   COLONOSCOPY PROCEDURE REPORT     EXAM DATE: Jul 16, 2015  PATIENT NAME:      John, Warren           MR #:      [id number]  BIRTHDATE:       08/13/1951      VISIT #:     [phone]  ATTENDING:     Wilford Corner, MD     STATUS:     inpatient REFERRING MD:      hospital team ASA CLASS:        Class III  INDICATIONS:  The patient is a 64 yr old male here for a colonoscopy due to anemia, non-specific. PROCEDURE PERFORMED:     Colonoscopy with snare polypectomy MEDICATIONS:     Monitored anesthesia care and Per Anesthesia  ESTIMATED BLOOD LOSS:     None  CONSENT: The patient understands the risks and benefits of the procedure and understands that these risks include, but are not limited to: sedation, allergic reaction, infection, perforation and/or bleeding. Alternative means of evaluation and treatment include, among others: physical exam, x-rays, and/or surgical intervention. The patient elects to proceed with this endoscopic procedure.  DESCRIPTION OF PROCEDURE: During intra-op preparation period all mechanical & medical equipment was checked for proper function. Hand hygiene and appropriate measures for infection prevention was taken. After the risks, benefits and alternatives of the procedure were thoroughly explained, Informed consent was verified, confirmed and timeout was successfully executed by the treatment team. A digital exam revealed no abnormalities of the rectum.      The Pentax Ped Colon [id number] endoscope was introduced through the anus and advanced to the cecum, which was identified by both the appendix and ileocecal valve. The prep was fair.. The instrument was then slowly withdrawn as the colon was fully examined. Estimated blood loss is zero unless otherwise noted in this procedure report. A 7 mm sessile polyp removed with snare cautery from the descending colon. A1 cm  sessile polyp removed with snare cautery from the sigmoid colon. A 2.5 cm semi-pedunculated polyp removed from the distal sigmoid colon with snare cautery. Diffuse diverticulosis noted in the sigmoid colon. Several medium-sized erythematous spots noted in the ascending colon that appeared to be arteriovenous malformations (nonbleeding).     Retroflexed views revealed small internal hemorrhoids.  The scope was then completely withdrawn from the patient and the procedure terminated.     ADVERSE EVENTS:      There were no immediate complications.   IMPRESSIONS:     Colon polyps X 3 - s/p snare cautery (see above) Sigmoid Diverticulosis Internal hemorrhoids Proximal colon AVMs Suspect anemia multifactorial  RECOMMENDATIONS:     F/U on path; Low sodium diet; Avoid NSAIDs    Wilford Corner, MD eSigned:  Wilford Corner, MD July 16, 2015 1:10 PM   cc:  CPT CODES: ICD CODES:  The ICD and CPT codes recommended by this software are interpretations from the data that the clinical staff has captured with the software.  The verification of the translation of this report to the ICD and CPT codes and modifiers is the sole responsibility of the health care institution and practicing physician where this report was generated.  [facility]. will not be held responsible for the validity of the ICD and CPT codes included on this report.  AMA assumes no liability for data contained or not contained herein. CPT is a  registered trademark of the [employer].  PATIENT NAME:  John, Warren MR#: [id number]

## 2015-07-15 NOTE — Transfer of Care (Signed)
Immediate Anesthesia Transfer of Care Note  Patient: John Warren  Procedure(s) Performed: Procedure(s): ESOPHAGOGASTRODUODENOSCOPY (EGD) (N/A) COLONOSCOPY (N/A)  Patient Location: Endoscopy Unit  Anesthesia Type:MAC  Level of Consciousness: awake, alert  and oriented  Airway & Oxygen Therapy: Patient Spontanous Breathing  Post-op Assessment: Report given to RN and Post -op Vital signs reviewed and stable  Post vital signs: Reviewed and stable  Last Vitals:  Filed Vitals:   07/15/15 1258  BP: 88/34  Pulse: 91  Temp: 36.6 C  Resp: 20    Complications: No apparent anesthesia complications

## 2015-07-15 NOTE — Brief Op Note (Signed)
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 No varices seen. Three colon polyps removed with snare cautery. No active bleeding noted. See endopro note for complete details/recs.

## 2015-07-15 NOTE — Op Note (Signed)
[facility] [place], [address]   ENDOSCOPY PROCEDURE REPORT  PATIENT: John Warren, John Warren  MR#: [id number] BIRTHDATE: [birth year] , 46  yrs. old GENDER: male ENDOSCOPIST: Wilford Corner, MD REFERRED BY: PROCEDURE DATE:  22-Jul-2015 PROCEDURE:  EGD, diagnostic ASA CLASS:     Class III INDICATIONS:  anemia. MEDICATIONS: Monitored anesthesia care and Per Anesthesia TOPICAL ANESTHETIC: none  DESCRIPTION OF PROCEDURE: After the risks benefits and alternatives of the procedure were thoroughly explained, informed consent was obtained.  The Pentax Gastroscope [id number] endoscope was introduced through the mouth and advanced to the second portion of the duodenum , Without limitations.  The instrument was slowly withdrawn as the mucosa was fully examined. Estimated blood loss is zero unless otherwise noted in this procedure report.    Esophagus normal and no varices noted. GEJ 42 cm from the incisors. Mosaic pattern (nonbleeding) in stomach consistent with mild portal hypertensive gastropathy. Stomach otherwise normal in appearance. Duodenal bulb and 2nd portion of the duodenum normal in appearance.      Retroflexed views revealed no abnormalities. The scope was then withdrawn from the patient and the procedure completed.  COMPLICATIONS: There were no immediate complications.  ENDOSCOPIC IMPRESSION:     Mild Portal Hypertensive Gastropathy otherwise normal EGD  RECOMMENDATIONS:     Proceed with colonoscopy   eSigned:  Wilford Corner, MD July 22, 2015 12:59 PM    CC:  CPT CODES: ICD CODES:  The ICD and CPT codes recommended by this software are interpretations from the data that the clinical staff has captured with the software.  The verification of the translation of this report to the ICD and CPT codes and modifiers is the sole responsibility of the health care institution and practicing physician where this report was generated.   [facility]. will not be held responsible for the validity of the ICD and CPT codes included on this report.  AMA assumes no liability for data contained or not contained herein. CPT is a Designer, television/film set of the [employer].  PATIENT NAME:  Lemar, Bakos MR#: [id number]

## 2015-07-15 NOTE — Interval H&P Note (Signed)
History and Physical Interval Note:  07/15/2015 11:59 AM  John Warren  has presented today for surgery, with the diagnosis of anemia  The various methods of treatment have been discussed with the patient and family. After consideration of risks, benefits and other options for treatment, the patient has consented to  Procedure(s): ESOPHAGOGASTRODUODENOSCOPY (EGD) (N/A) COLONOSCOPY (N/A) as a surgical intervention .  The patient's history has been reviewed, patient examined, no change in status, stable for surgery.  I have reviewed the patient's chart and labs.  Questions were answered to the patient's satisfaction.     [place] C.

## 2015-07-15 NOTE — Progress Notes (Signed)
Subjective:  Had colonoscopy prep.  Noted some slight redness in his stool and some bowel movements.  Shortness of breath is better.  No chest pain.  Objective:  Vital Signs in the last 24 hours: BP 100/50 mmHg  Pulse 86  Temp(Src) 98 F (36.7 C) (Oral)  Resp 18  Ht 6\' 3"  (1.905 m)  Wt 89.858 kg (198 lb 1.6 oz)  BMI 24.76 kg/m2  SpO2 100%  Physical Exam: Pleasant male in no acute distress Lungs:  Increased AP diameter, reduced breath sounds Cardiac:  Regular  rhythm, normal S1 and S2, no S3, 2/6 murmur Abdomen:  Distended and Soft, nontender, no masses Extremities:  3+ edema present of the left leg, 2+ of the right leg  Intake/Output from previous day: 08/11 0701 - 08/12 0700 In: 960 [P.O.:960] Out: 475 [Urine:475] Weight Filed Weights   07/13/15 0515 07/14/15 0520 07/15/15 0649  Weight: 91.128 kg (200 lb 14.4 oz) 90.493 kg (199 lb 8 oz) 89.858 kg (198 lb 1.6 oz)    Lab Results: Basic Metabolic Panel:  Recent Labs  07/14/15 0516 07/15/15 0300  NA 134* 133*  K 2.8* 3.3*  CL 96* 97*  CO2 30 27  GLUCOSE 103* 90  BUN 9 7  CREATININE 0.91 0.91   CBC:  Recent Labs  07/14/15 0516 07/15/15 0300  WBC 6.8 5.5  HGB 7.8* 8.1*  HCT 25.6* 27.4*  MCV 70.5* 70.4*  PLT 121* 136*    BNP    Component Value Date/Time   BNP 331.3* 07/11/2015 2146   Telemetry:   Assessment/Plan:  1.  Severe symptomatic anemia 2.  Congestive heart failure could be high output due to anemia or diastolic.  It is not systolic  3.  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  Hypokalemia continue replacement 5.  Iron deficiency anemia 6.  COPD  Recommendations:  Lower extremity Dopplers did not show any DVT.  Weight is down from admission.  Unclear whether his weight was 213 or 207 from looking at the chart on admission.  He is to have colonoscopy and endoscopy today.  Hemoglobin has improved.  Continue diuresis.  Check x-ray of the leg.  Continue to replete  potassium.  Depending on results of colonoscopy and endoscopy may be able to be discharged over the weekend.      Kerry Hough  MD [facility] Cardiology  07/15/2015, 8:45 AM

## 2015-07-15 NOTE — H&P (View-Only) (Signed)
Patient ID: John Warren, male   DOB: Feb 10, 1951, 64 y.o.   MRN: [id number] [facility] Gastroenterology Progress Note  John Warren 64 y.o. [birth year]   Subjective: Feels a lot better in terms of his shortness of breath. Having loose nonbloody stools that he was not having prior to admit.  Objective: Vital signs in last 24 hours: Filed Vitals:   07/13/15 0515  BP: 109/58  Pulse: 102  Temp: 98 F (36.7 C)  Resp: 18    Physical Exam: Gen: alert, no acute distress CV: RRR Chest: CTA B Abd: soft, nontender, nondistended, +BS Ext: 2+ LE edema  Lab Results:  Recent Labs  07/12/15 0305 07/13/15 0331  NA 135 136  K 3.2* 3.4*  CL 99* 98*  CO2 28 28  GLUCOSE 137* 106*  BUN 10 10  CREATININE 1.18 1.01  CALCIUM 8.4* 8.3*    Recent Labs  07/11/15 2146  AST 54*  ALT 27  ALKPHOS 104  BILITOT 2.1*  PROT 5.6*  ALBUMIN 2.7*    Recent Labs  07/11/15 1700 07/11/15 2146 07/13/15 0928  WBC 9.3 8.2 8.2  NEUTROABS 6.1 5.8  --   HGB 7.9* 7.3* 8.2*  HCT 26.7* 24.4* 26.9*  MCV 70.6* 69.3* 70.1*  PLT 153 142* 131*   No results for input(s): LABPROT, INR in the last 72 hours.    Assessment/Plan: Anemia with melena prior to admit and U/S showing cirrhosis in need of GI evaluation. Diarrhea since admit so will check C. Diff. Doubt ischemic colitis. EGD/colonoscopy Friday.   [name] 07/13/2015, 10:44 AM  Pager [phone]  If no answer or after 5 PM call [phone]

## 2015-07-15 NOTE — Anesthesia Postprocedure Evaluation (Signed)
  Anesthesia Post-op Note  Patient: John Warren  Procedure(s) Performed: Procedure(s): ESOPHAGOGASTRODUODENOSCOPY (EGD) (N/A) COLONOSCOPY (N/A)  Patient Location: Endoscopy Unit  Anesthesia Type:MAC  Level of Consciousness: awake and alert   Airway and Oxygen Therapy: Patient Spontanous Breathing  Post-op Pain: none  Post-op Assessment: Post-op Vital signs reviewed and Patient's Cardiovascular Status Stable              Post-op Vital Signs: Reviewed and stable  Last Vitals:  Filed Vitals:   07/15/15 1320  BP: 104/50  Pulse: 90  Temp:   Resp: 17    Complications: No apparent anesthesia complications

## 2015-07-16 DIAGNOSIS — R609 Edema, unspecified: Secondary | ICD-10-CM

## 2015-07-16 DIAGNOSIS — K7031 Alcoholic cirrhosis of liver with ascites: Secondary | ICD-10-CM

## 2015-07-16 DIAGNOSIS — R0600 Dyspnea, unspecified: Secondary | ICD-10-CM

## 2015-07-16 DIAGNOSIS — D500 Iron deficiency anemia secondary to blood loss (chronic): Secondary | ICD-10-CM

## 2015-07-16 LAB — BASIC METABOLIC PANEL
ANION GAP: 6 (ref 5–15)
BUN: 8 mg/dL (ref 6–20)
CO2: 30 mmol/L (ref 22–32)
Calcium: 8.4 mg/dL — ABNORMAL LOW (ref 8.9–10.3)
Chloride: 98 mmol/L — ABNORMAL LOW (ref 101–111)
Creatinine, Ser: 0.99 mg/dL (ref 0.61–1.24)
GFR calc non Af Amer: >60 mL/min (ref >60)
Glucose, Bld: 111 mg/dL — ABNORMAL HIGH (ref 65–99)
Potassium: 3.7 mmol/L (ref 3.5–5.1)
Sodium: 134 mmol/L — ABNORMAL LOW (ref 135–145)

## 2015-07-16 LAB — CBC
HEMATOCRIT: 24.3 % — AB (ref 39.0–52.0)
Hemoglobin: 7.2 g/dL — ABNORMAL LOW (ref 13.0–17.0)
MCH: 21.2 pg — AB (ref 26.0–34.0)
MCHC: 29.6 g/dL — ABNORMAL LOW (ref 30.0–36.0)
MCV: 71.7 fL — AB (ref 78.0–100.0)
PLATELETS: 108 10*3/uL — AB (ref 150–400)
RBC: 3.39 MIL/uL — AB (ref 4.22–5.81)
RDW: 19.2 % — ABNORMAL HIGH (ref 11.5–15.5)
WBC: 6.2 10*3/uL (ref 4.0–10.5)

## 2015-07-16 LAB — PREPARE RBC (CROSSMATCH)

## 2015-07-16 MED ORDER — ACETAMINOPHEN 325 MG PO TABS
650.0000 mg | ORAL_TABLET | Freq: Once | ORAL | Status: AC
  Administered 2015-07-16: 650 mg via ORAL
  Filled 2015-07-16: qty 2

## 2015-07-16 MED ORDER — DIPHENHYDRAMINE HCL 25 MG PO CAPS
25.0000 mg | ORAL_CAPSULE | Freq: Once | ORAL | Status: AC
  Administered 2015-07-16: 25 mg via ORAL
  Filled 2015-07-16: qty 1

## 2015-07-16 MED ORDER — PROMETHAZINE HCL 25 MG/ML IJ SOLN: 6.2500 mg | INTRAMUSCULAR | Status: DC | PRN

## 2015-07-16 MED ORDER — SODIUM CHLORIDE 0.9 % IV SOLN: Freq: Once | INTRAVENOUS | Status: DC

## 2015-07-16 NOTE — Progress Notes (Signed)
    Subjective:  Mild SOB, feels ok  Objective:  Vital Signs in the last 24 hours: Temp:  [97.6 F (36.4 C)-99.3 F (37.4 C)] 97.6 F (36.4 C) (08/13 1352) Pulse Rate:  [94-113] 95 (08/13 1352) Resp:  [17-20] 20 (08/13 1352) BP: (93-109)/(48-59) 103/49 mmHg (08/13 1352) SpO2:  [97 %-100 %] 100 % (08/13 1352) Weight:  [199 lb 9.6 oz (90.538 kg)] 199 lb 9.6 oz (90.538 kg) (08/13 0520)  Intake/Output from previous day: 08/12 0701 - 08/13 0700 In: 1860 [P.O.:960; I.V.:900] Out: 3200 [Urine:3200]   Physical Exam: General: Well developed, well nourished, in no acute distress. Head:  Normocephalic and atraumatic. Lungs: Clear to auscultation and percussion. Heart: Normal S1 and S2.  No murmur, rubs or gallops.  Abdomen: soft, non-tender, positive bowel sounds. protuberant Extremities: No clubbing or cyanosis. chronic edema. Neurologic: Alert and oriented x 3.    Lab Results:  Recent Labs  07/15/15 0300 07/16/15 0307  WBC 5.5 6.2  HGB 8.1* 7.2*  PLT 136* 108*    Recent Labs  07/15/15 0300 07/16/15 0307  NA 133* 134*  K 3.3* 3.7  CL 97* 98*  CO2 27 30  GLUCOSE 90 111*  BUN 7 8  CREATININE 0.91 0.99   No results for input(s): TROPONINI in the last 72 hours.  Invalid input(s): CK, MB Hepatic Function Panel No results for input(s): PROT, ALBUMIN, AST, ALT, ALKPHOS, BILITOT, BILIDIR, IBILI in the last 72 hours.  Recent Labs  07/14/15 0516  CHOL 134   No results for input(s): PROTIME in the last 72 hours.  Imaging: Dg Foot 2 Views Left  07/15/2015   CLINICAL DATA:  Swelling all over lt foot no inj per pt  EXAM: LEFT FOOT - 2 VIEW  COMPARISON:  None.  FINDINGS: No fracture. Joints are normally aligned. There is midfoot dorsal degenerative spurring. No other significant arthropathic change.  There are plantar and dorsal calcaneal spurs.  Bones are demineralized. There is diffuse dorsal soft tissue swelling. No soft tissue air.  IMPRESSION: 1. No fracture or  dislocation. 2. Midfoot arthropathic change. 3. Heel spurs. 4. Significant dorsal soft tissue swelling mostly across the forefoot.   Electronically Signed   By: Lajean Manes M.D.   On: 07/15/2015 09:29   Personally viewed.   Telemetry: No adverse rhythms Personally viewed.   Assessment/Plan:  Principal Problem:   Dyspnea Active Problems:   Edema   Iron deficiency anemia due to chronic blood loss   Acute on chronic diastolic congestive heart failure   COPD (chronic obstructive pulmonary disease)   Cirrhosis, alcoholic  -Hemoglobin currently 7.2  - I will transfuse 1 unit of blood. He has had one previous transfusion he states.  - Pre-medications ordered.  Overall he feels fairly well.  Likely discharge tomorrow.   John Warren, [place] 07/16/2015, 2:31 PM

## 2015-07-16 NOTE — Progress Notes (Addendum)
Patient ID: John Warren, male   DOB: Aug 16, 1951, 64 y.o.   MRN: [id number] [facility] Gastroenterology Progress Note  John Warren 64 y.o. 01/07/1951   Subjective: No complaints. Tolerating solid food. Reports that he has been walking halls.  Objective: Vital signs in last 24 hours: Filed Vitals:   07/16/15 0520  BP: 93/48  Pulse: 100  Temp: 99.1 F (37.3 C)  Resp: 17    Physical Exam: Gen: alert, no acute distress CV: RRR Chest: CTA B Abd: soft, nontender, nondistended, +BS Ext: +LE edema  Lab Results:  Recent Labs  07/15/15 0300 07/16/15 0307  NA 133* 134*  K 3.3* 3.7  CL 97* 98*  CO2 27 30  GLUCOSE 90 111*  BUN 7 8  CREATININE 0.91 0.99  CALCIUM 8.3* 8.4*   No results for input(s): AST, ALT, ALKPHOS, BILITOT, PROT, ALBUMIN in the last 72 hours.  Recent Labs  07/15/15 0300 07/16/15 0307  WBC 5.5 6.2  HGB 8.1* 7.2*  HCT 27.4* 24.3*  MCV 70.4* 71.7*  PLT 136* 108*   No results for input(s): LABPROT, INR in the last 72 hours.    Assessment/Plan: S/P endo/colon yesterday for anemia and suspect anemia multifactorial from his cirrhosis and AVMs. F/U on path from polyps next week as outpt. Ok to go home today from GI standpoint and needs CBC next week. Will sign off. Call if questions.   [place] C. 07/16/2015, 9:24 AM  Pager [phone]  If no answer or after 5 PM call [phone]

## 2015-07-17 ENCOUNTER — Encounter (HOSPITAL_COMMUNITY): Payer: Self-pay | Admitting: Physician Assistant

## 2015-07-17 DIAGNOSIS — K5520 Angiodysplasia of colon without hemorrhage: Secondary | ICD-10-CM

## 2015-07-17 DIAGNOSIS — I471 Supraventricular tachycardia, unspecified: Secondary | ICD-10-CM | POA: Diagnosis present

## 2015-07-17 DIAGNOSIS — IMO0002 Reserved for concepts with insufficient information to code with codable children: Secondary | ICD-10-CM | POA: Diagnosis present

## 2015-07-17 DIAGNOSIS — K648 Other hemorrhoids: Secondary | ICD-10-CM | POA: Diagnosis present

## 2015-07-17 DIAGNOSIS — K766 Portal hypertension: Secondary | ICD-10-CM

## 2015-07-17 DIAGNOSIS — E876 Hypokalemia: Secondary | ICD-10-CM | POA: Diagnosis present

## 2015-07-17 DIAGNOSIS — K5730 Diverticulosis of large intestine without perforation or abscess without bleeding: Secondary | ICD-10-CM | POA: Diagnosis present

## 2015-07-17 DIAGNOSIS — F17200 Nicotine dependence, unspecified, uncomplicated: Secondary | ICD-10-CM | POA: Diagnosis present

## 2015-07-17 DIAGNOSIS — D696 Thrombocytopenia, unspecified: Secondary | ICD-10-CM | POA: Diagnosis present

## 2015-07-17 DIAGNOSIS — K3189 Other diseases of stomach and duodenum: Secondary | ICD-10-CM | POA: Diagnosis present

## 2015-07-17 LAB — TYPE AND SCREEN
ABO/RH(D): O POS
Antibody Screen: NEGATIVE
Unit division: 0

## 2015-07-17 LAB — CBC
HCT: 28.9 % — ABNORMAL LOW (ref 39.0–52.0)
Hemoglobin: 8.8 g/dL — ABNORMAL LOW (ref 13.0–17.0)
MCH: 22.1 pg — AB (ref 26.0–34.0)
MCHC: 30.4 g/dL (ref 30.0–36.0)
MCV: 72.6 fL — ABNORMAL LOW (ref 78.0–100.0)
Platelets: 101 10*3/uL — ABNORMAL LOW (ref 150–400)
RBC: 3.98 MIL/uL — ABNORMAL LOW (ref 4.22–5.81)
RDW: 19.4 % — ABNORMAL HIGH (ref 11.5–15.5)
WBC: 5.9 10*3/uL (ref 4.0–10.5)

## 2015-07-17 LAB — BASIC METABOLIC PANEL
Anion gap: 10 (ref 5–15)
BUN: 8 mg/dL (ref 6–20)
CO2: 27 mmol/L (ref 22–32)
Calcium: 8.4 mg/dL — ABNORMAL LOW (ref 8.9–10.3)
Chloride: 98 mmol/L — ABNORMAL LOW (ref 101–111)
Creatinine, Ser: 1.02 mg/dL (ref 0.61–1.24)
GLUCOSE: 99 mg/dL (ref 65–99)
POTASSIUM: 3.1 mmol/L — AB (ref 3.5–5.1)
Sodium: 135 mmol/L (ref 135–145)

## 2015-07-17 MED ORDER — POTASSIUM CHLORIDE CRYS ER 20 MEQ PO TBCR
40.0000 meq | EXTENDED_RELEASE_TABLET | Freq: Once | ORAL | Status: DC
  Filled 2015-07-17: qty 2

## 2015-07-17 MED ORDER — POTASSIUM CHLORIDE CRYS ER 20 MEQ PO TBCR: 40.0000 meq | EXTENDED_RELEASE_TABLET | Freq: Every day | ORAL | Status: DC

## 2015-07-17 MED ORDER — SPIRONOLACTONE 25 MG PO TABS: 25.0000 mg | ORAL_TABLET | Freq: Every day | ORAL | Status: DC

## 2015-07-17 MED ORDER — FERROUS SULFATE 325 (65 FE) MG PO TABS
325.0000 mg | ORAL_TABLET | Freq: Three times a day (TID) | ORAL | Status: DC
Start: 2015-07-17 — End: 2018-12-27

## 2015-07-17 MED ORDER — FUROSEMIDE 80 MG PO TABS: 80.0000 mg | ORAL_TABLET | Freq: Every day | ORAL | Status: DC

## 2015-07-17 NOTE — Discharge Summary (Signed)
Discharge Summary   Patient ID: John Warren MRN: [id number], DOB/AGE: 07/26/1951 64 y.o. Admit date: 07/11/2015 D/C date:     66/14/2016  Primary Care Provider: No primary care provider on file. Primary Cardiologist: Dr. Wynonia Lawman Primary Gastroenterologist: Dr. Michail Sermon  Primary Discharge Diagnoses:  1. Severe symptomatic anemia  felt multifactorial from cirrhosis and AVMs 2. Dyspnea/edema, question due to acute diastolic congestive heart failure versus anemia with high output failure vs hypoalbuminemia 3. Cirrhosis of the liver with hyperechoic lesion within the left lobe of the liver that will require follow-up per GI 4. Thrombocytopenia 5. H/o significant alcohol/tobacco abuse 6. Hypokalemia  7. Mild portal hypertensive gastropathy by EGD 8. Findings on colonoscopy: Colon polyps X 3 - s/p snare cautery, Sigmoid Diverticulosis, Internal hemorrhoids, Proximal colon AVMs 9. SVT in setting of severe hypokalemia 10. HTN  Secondary Discharge Diagnoses:  1. H/o COPD 2. H/o GERD   Hospital Course: John Warren is a 64 y/o M with history of HTN, GERD, alcohol abuse/tobacco abuse who was recently referred to Dr. Wynonia Lawman 07/11/15 for several week history of progressive dyspnea with exertion. He previously was able to cut his grass but he has been unable to work or exercise and is dyspneic with less than a level of activity. He began to have edema as well as PND and saw his primary doctor the week prior. He was found to have a hemoglobin of 7.2 and was placed on furosemide. The patient has also noted some melena and some intermittent blood in his stools. He reported losing about 30 pounds of weight according to him. He also reported some chest discomfort sometimes with exertion sometimes not. He was seen in the office today and was noted to be severely dyspneic and tachycardic with significant edema despite being on outpatient diuretics. He was brought into the hospital for further evaluation of the above  complaints, felt to CHF unclear of contribution of anemia. He was placed on IV Lasix. Admitting Hgb was 7.9 (microcytic), BNP 327.  The patient was noted to have runs of SVT in the setting of K 2.5 requiring repletion. He diuresed briskly with IV lasix. Further history revealed that he was drinking quite heavily about a pint a day and also smoked quite heavily in the past. He also reported taking ibuprofen 600mg  daily for months of left foot pain until stopping 2 weeks ago. 2D echo 07/12/15: EF 55-60%, mod focal basal hypertrophy, grade 2 DD, high ventricular filling pressure, aortic scerlosis with mild AI. Hgb further downtrended to 7.3 and he was given blood transfusion. Etiology of his edema/dyspnea was felt possibly due to acute diastolic congestive heart failure but may have been more attributed to anemia with high output failure vs hypoalbuminemia. Albumin 2.7. Abd US showed changes c/w cirrhosis with associated ascites, bilateral pleural effusions, gallbladder wall thickening without cholelithiasis, and hyperechoic lesion within the left lobe of the liver as described (radiology report: "Statistically this still likely represents a hemangioma although further evaluation is recommended. MRI would be most suited for this when the patient's condition improves.") LE duplex negative for DVT. He went for EGD on 07/15/15 which showed mild portal hypertensive gastropathy, otherwise normal EGD. He was started on spironolactone. He underwent colonscopy as well showing colon polyps X 3 - s/p snare cautery (see above), sigmoid diverticulosis, internal hemorrhoids, proximal colon AVMs. No active bleeding was noted. GI felt that anemia was multifactorial and planned to f/u on pathology. GI advised the patient to avoid NSAIDS and follow low  sodium diet. He was placed on iron repletion. The patient received another blood transfusion yesterday for decreased Hgb to 7.2. As of today, the patient has diuresed to a weight of 195lb.  (Admitting weight not clear - had both 206 and 213 recorded). -5L UOP. Discharge labs reveal Cr 1.02, Hgb 8.8, platelet count 101. The patient feels better today. Dr. Marlou Porch has recommended to continue spironolactone and resume home Lasix dose. He required additional KCl prior to discharge due to K 3.1. Dr. Marlou Porch has seen and examined the patient today and feels he is stable for discharge.  As it is a weekend, we have instructed the patient to please call Dr. Kathline Magic office to arrange GI follow-up. He will need followup of his chronic anemia/blood loss, cirrhosis, colon polyp pathology, as well as hyperechoic lesion within the left lobe of the liver as described above. The patient was made aware of this finding. With regard to follow-up with Dr. Wynonia Lawman, I have sent a message to him via Epic requesting a follow-up appointment and CBC next week as recommended. He would also likely benefit from a BMET given hypokalemia this admission. He was started on spironolactone but still consistently had a low potassium level requiring repletion and eventual increase of his maintenance dose.  Discharge Vitals: Blood pressure 110/59, pulse 91, temperature 97.4 F (36.3 C), temperature source Oral, resp. rate 18, height 6\' 3"  (1.905 m), weight 195 lb 9.6 oz (88.724 kg), SpO2 98 %.  Labs: Lab Results  Component Value Date   WBC 5.9 07/17/2015   HGB 8.8* 07/17/2015   HCT 28.9* 07/17/2015   MCV 72.6* 07/17/2015   PLT 101* 07/17/2015    Recent Labs Lab 07/11/15 2146  07/17/15 0334  NA 134*  < > 135  K 2.5*  < > 3.1*  CL 95*  < > 98*  CO2 28  < > 27  BUN 7  < > 8  CREATININE 1.19  < > 1.02  CALCIUM 8.2*  < > 8.4*  PROT 5.6*  --   --   BILITOT 2.1*  --   --   ALKPHOS 104  --   --   ALT 27  --   --   AST 54*  --   --   GLUCOSE 210*  < > 99  < > = values in this interval not displayed.  Lab Results  Component Value Date   CHOL 134 07/14/2015   HDL 16* 07/14/2015   LDLCALC 99 07/14/2015   TRIG 93  07/14/2015     Diagnostic Studies/Procedures   Dg Chest 2 View  07/12/2015   CLINICAL DATA:  CHF, mild shortness of breath and productive cough, long history of tobacco use.  EXAM: CHEST  2 VIEW  COMPARISON:  Chest x-ray of July 11, 2015  FINDINGS: The lungs remain hyperinflated. The interstitial markings remain increased bilaterally. Small bilateral pleural effusions greater on the right than on the left persists. The heart is top-normal in size. The pulmonary vascularity is mildly prominent centrally. The bony thorax exhibits no acute abnormality.  IMPRESSION: COPD with superimposed CHF and interstitial edema and bilateral pleural effusions. There has not been significant interval change since yesterday's study.   Electronically Signed   By: David  Martinique M.D.   On: 07/12/2015 07:30   Dg Chest 2 View  07/11/2015   CLINICAL DATA:  Increased shortness of breath for 1 month  EXAM: CHEST  2 VIEW  COMPARISON:  08/25/2010  FINDINGS: There are bilateral  small pleural effusions. There is diffuse bilateral mild interstitial thickening. There is no pneumothorax. There is no focal consolidation. There is right basilar atelectasis. The heart size is normal. There is thoracic aortic atherosclerosis. There is no acute osseous abnormality.  IMPRESSION: Bilateral interstitial thickening and bilateral small pleural effusions concerning for mild pulmonary edema.   Electronically Signed   By: Kathreen Devoid   On: 07/11/2015 17:25   US Abdomen Complete  07/13/2015   ADDENDUM REPORT: 07/13/2015 08:47  ADDENDUM: The amount of ascites is minimal with no sizable pocket to allow for paracentesis.   Electronically Signed   By: Inez Catalina M.D.   On: 07/13/2015 08:47   07/13/2015   CLINICAL DATA:  Cirrhosis, hypertension  EXAM: ULTRASOUND ABDOMEN COMPLETE  COMPARISON:  None.  FINDINGS: Gallbladder: Wall thickening is identified 8 years. No cholelithiasis is identified. A negative sonographic Percell Miller sign is noted. The wall  thickening is likely related to the underlying ascites.  Common bile duct: Diameter: 5.9 mm.  Liver: Diffuse heterogeneity with mild nodularity consistent with underlying cirrhosis. A focal echogenic areas noted within the left lobe of the liver which measures 2.7 x 2.7 cm. This likely represents a hemangioma although further evaluation is recommended given the patient's underlying cirrhotic change.  IVC: No abnormality visualized.  Pancreas: Visualized portion unremarkable.  Spleen: Mildly prominent although maintains its spleniform shape.  Right Kidney: Length: 12.7 cm. Echogenicity within normal limits. No mass or hydronephrosis visualized.  Left Kidney: Length: 11.8 cm. Echogenicity within normal limits. No mass or hydronephrosis visualized.  Abdominal aorta: No aneurysm visualized.  Other findings: Bilateral pleural effusions are noted.  IMPRESSION: Changes consistent with cirrhosis of the liver with associated ascites.  Bilateral pleural effusions.  Gallbladder wall thickening without cholelithiasis. This is likely related to the underlying ascites.  Hyperechoic lesion within the left lobe of the liver as described. Statistically this still likely represents a hemangioma although further evaluation is recommended. MRI would be most suited for this when the patient's condition improves.  Electronically Signed: By: Inez Catalina M.D. On: 07/13/2015 07:59   Dg Foot 2 Views Left  07/15/2015   CLINICAL DATA:  Swelling all over lt foot no inj per pt  EXAM: LEFT FOOT - 2 VIEW  COMPARISON:  None.  FINDINGS: No fracture. Joints are normally aligned. There is midfoot dorsal degenerative spurring. No other significant arthropathic change.  There are plantar and dorsal calcaneal spurs.  Bones are demineralized. There is diffuse dorsal soft tissue swelling. No soft tissue air.  IMPRESSION: 1. No fracture or dislocation. 2. Midfoot arthropathic change. 3. Heel spurs. 4. Significant dorsal soft tissue swelling mostly  across the forefoot.   Electronically Signed   By: Lajean Manes M.D.   On: 07/15/2015 09:29   EGD/Colonoscopy - see above, see reports  Discharge Medications   Current Discharge Medication List    START taking these medications   Details  ferrous sulfate 325 (65 FE) MG tablet Take 1 tablet (325 mg total) by mouth 3 (three) times daily with meals. Qty: 90 tablet, Refills: 1    spironolactone (ALDACTONE) 25 MG tablet Take 1 tablet (25 mg total) by mouth daily. Qty: 30 tablet, Refills: 1      CONTINUE these medications which have CHANGED   Details  potassium chloride SA (K-DUR,KLOR-CON) 20 MEQ tablet Take 2 tablets (40 mEq total) by mouth daily. Qty: 60 tablet, Refills: 1      CONTINUE these medications which have NOT CHANGED  Details  furosemide (LASIX) 80 MG tablet Take 80 mg by mouth daily.    nitroGLYCERIN (NITROSTAT) 0.4 MG SL tablet Place 0.4 mg under the tongue every 5 (five) minutes as needed for chest pain.    omeprazole (PRILOSEC) 40 MG capsule Take 40 mg by mouth every evening.        Disposition   The patient will be discharged in stable condition to home. Discharge Instructions    Diet - low sodium heart healthy    Complete by:  As directed      Increase activity slowly    Complete by:  As directed   Patients with cirrhosis and anemia should generally stay away from medicines like ibuprofen, Advil, Motrin, naproxen, and Aleve due to risk of stomach bleeding. Talk to primary doctor or gastroenterologist about alternatives.  NEW medicines: ferrous sulfate, spironolactone Medicines that have CHANGED: potassium          Follow-up Information    Follow up with W Tollie Eth, MD.   Specialty:  Cardiology   Why:  Office will call you for your followup appointment and repeat bloodwork. Call office if you have not heard back in 3 days.   Contact information:   [address] [place] [address] [phone]       Follow up with  Lear Ng., MD.   Specialty:  Gastroenterology   Why:  Please call the office for follow-up appointment. You will need to be followed for your cirrhosis as well as possible abnormality seen on your liver ultrasound. They will also be following up on the pathology seen on your colon biopsy.   Contact information:   [address] [place] [address] [phone]         Duration of Discharge Encounter: Greater than 30 minutes including physician and PA time.  Rudean Hitt, [place] PA-C 07/17/2015, 12:10 PM

## 2015-07-17 NOTE — Progress Notes (Signed)
    Subjective:  Received blood, no overt signs of bleeding.  Objective:  Vital Signs in the last 24 hours: Temp:  [97.4 F (36.3 C)-98.7 F (37.1 C)] 97.4 F (36.3 C) (08/14 0428) Pulse Rate:  [91-101] 91 (08/14 0428) Resp:  [16-20] 18 (08/14 0428) BP: (103-123)/(49-59) 110/59 mmHg (08/14 0428) SpO2:  [96 %-100 %] 98 % (08/14 0428) Weight:  [195 lb 9.6 oz (88.724 kg)] 195 lb 9.6 oz (88.724 kg) (08/14 0428)  Intake/Output from previous day: 08/13 0701 - 08/14 0700 In: 2742.1 [P.O.:2160; I.V.:250; Blood:332.1] Out: 4575 [Urine:4575]   Physical Exam: General: Well developed, well nourished, in no acute distress. Head:  Normocephalic and atraumatic. Lungs: Clear to auscultation and percussion. Heart: Normal S1 and S2.  No murmur, rubs or gallops.  Abdomen: soft, non-tender, positive bowel sounds. protuberant Extremities: No clubbing or cyanosis. chronic mild edema. Neurologic: Alert and oriented x 3.    Lab Results:  Recent Labs  07/16/15 0307 07/17/15 0334  WBC 6.2 5.9  HGB 7.2* 8.8*  PLT 108* 101*    Recent Labs  07/16/15 0307 07/17/15 0334  NA 134* 135  K 3.7 3.1*  CL 98* 98*  CO2 30 27  GLUCOSE 111* 99  BUN 8 8  CREATININE 0.99 1.02   Telemetry: No adverse rhythms Personally viewed.   Assessment/Plan:  Principal Problem:   Dyspnea Active Problems:   Edema   Iron deficiency anemia due to chronic blood loss   Acute on chronic diastolic congestive heart failure   COPD (chronic obstructive pulmonary disease)   Cirrhosis, alcoholic  -Hemoglobin currently 8.8 from 7.2  -Post transfusion 1 unit yesterday 07/16/15  Overall he feels fairly well.  GI note reviewed - S/P endo/colon yesterday for anemia and suspect anemia multifactorial from his cirrhosis and AVMs. F/U on path from polyps next week as outpt. Ok to go home today from GI standpoint and needs CBC next week. Will sign off. Call if questions.  Discharge home  Follow-up with Dr.  Glynn Octave, [place] 07/17/2015, 11:05 AM

## 2015-07-18 ENCOUNTER — Encounter (HOSPITAL_COMMUNITY): Payer: Self-pay | Admitting: Gastroenterology

## 2015-09-13 ENCOUNTER — Other Ambulatory Visit: Payer: Self-pay | Admitting: Physician Assistant

## 2015-09-15 ENCOUNTER — Other Ambulatory Visit: Payer: Self-pay | Admitting: Physician Assistant

## 2015-09-26 ENCOUNTER — Other Ambulatory Visit: Payer: Self-pay | Admitting: Physician Assistant

## 2015-09-28 ENCOUNTER — Other Ambulatory Visit: Payer: Self-pay | Admitting: Physician Assistant

## 2015-09-29 NOTE — Telephone Encounter (Signed)
Correct. We only saw the patient in the hospital while Dr. Wynonia Lawman was on vacation. All medication refills should come from Dr. Wynonia Lawman, not [place]. Please decline and let patient/pharmacy know. Thx, Dayna

## 2015-09-29 NOTE — Telephone Encounter (Signed)
Pharmacy has been sending rx requests to this office under your name. Isn't the patient going to follow up with Dr Wynonia Lawman? Please advise. Thanks, MI

## 2015-12-29 ENCOUNTER — Inpatient Hospital Stay (HOSPITAL_COMMUNITY)
Admission: EM | Admit: 2015-12-29 | Discharge: 2016-01-02 | DRG: 378 | Disposition: A | Discharge status: Home / Self Care | Payer: Medicare Other | Attending: Internal Medicine | Admitting: Internal Medicine

## 2015-12-29 ENCOUNTER — Encounter (HOSPITAL_COMMUNITY): Payer: Self-pay | Admitting: *Deleted

## 2015-12-29 ENCOUNTER — Emergency Department (HOSPITAL_COMMUNITY): Payer: Medicare Other

## 2015-12-29 DIAGNOSIS — I8500 Esophageal varices without bleeding: Secondary | ICD-10-CM | POA: Diagnosis present

## 2015-12-29 DIAGNOSIS — Z880 Allergy status to penicillin: Secondary | ICD-10-CM

## 2015-12-29 DIAGNOSIS — R609 Edema, unspecified: Secondary | ICD-10-CM | POA: Diagnosis not present

## 2015-12-29 DIAGNOSIS — E876 Hypokalemia: Secondary | ICD-10-CM | POA: Diagnosis not present

## 2015-12-29 DIAGNOSIS — K5520 Angiodysplasia of colon without hemorrhage: Secondary | ICD-10-CM

## 2015-12-29 DIAGNOSIS — I864 Gastric varices: Secondary | ICD-10-CM | POA: Diagnosis present

## 2015-12-29 DIAGNOSIS — R739 Hyperglycemia, unspecified: Secondary | ICD-10-CM | POA: Diagnosis present

## 2015-12-29 DIAGNOSIS — K5791 Diverticulosis of intestine, part unspecified, without perforation or abscess with bleeding: Principal | ICD-10-CM | POA: Diagnosis present

## 2015-12-29 DIAGNOSIS — Z87891 Personal history of nicotine dependence: Secondary | ICD-10-CM

## 2015-12-29 DIAGNOSIS — K7030 Alcoholic cirrhosis of liver without ascites: Secondary | ICD-10-CM | POA: Diagnosis present

## 2015-12-29 DIAGNOSIS — K922 Gastrointestinal hemorrhage, unspecified: Secondary | ICD-10-CM | POA: Diagnosis present

## 2015-12-29 DIAGNOSIS — I5032 Chronic diastolic (congestive) heart failure: Secondary | ICD-10-CM | POA: Diagnosis present

## 2015-12-29 DIAGNOSIS — Z86010 Personal history of colonic polyps: Secondary | ICD-10-CM

## 2015-12-29 DIAGNOSIS — D649 Anemia, unspecified: Secondary | ICD-10-CM | POA: Insufficient documentation

## 2015-12-29 DIAGNOSIS — K766 Portal hypertension: Secondary | ICD-10-CM | POA: Diagnosis present

## 2015-12-29 DIAGNOSIS — F1010 Alcohol abuse, uncomplicated: Secondary | ICD-10-CM

## 2015-12-29 DIAGNOSIS — Q2733 Arteriovenous malformation of digestive system vessel: Secondary | ICD-10-CM

## 2015-12-29 DIAGNOSIS — R0602 Shortness of breath: Secondary | ICD-10-CM

## 2015-12-29 DIAGNOSIS — W19XXXA Unspecified fall, initial encounter: Secondary | ICD-10-CM | POA: Diagnosis present

## 2015-12-29 DIAGNOSIS — K219 Gastro-esophageal reflux disease without esophagitis: Secondary | ICD-10-CM | POA: Diagnosis present

## 2015-12-29 DIAGNOSIS — I110 Hypertensive heart disease with heart failure: Secondary | ICD-10-CM | POA: Diagnosis present

## 2015-12-29 DIAGNOSIS — K3189 Other diseases of stomach and duodenum: Secondary | ICD-10-CM | POA: Diagnosis present

## 2015-12-29 DIAGNOSIS — F1020 Alcohol dependence, uncomplicated: Secondary | ICD-10-CM | POA: Diagnosis present

## 2015-12-29 DIAGNOSIS — J449 Chronic obstructive pulmonary disease, unspecified: Secondary | ICD-10-CM | POA: Diagnosis present

## 2015-12-29 DIAGNOSIS — I509 Heart failure, unspecified: Secondary | ICD-10-CM | POA: Diagnosis not present

## 2015-12-29 DIAGNOSIS — I8510 Secondary esophageal varices without bleeding: Secondary | ICD-10-CM | POA: Diagnosis present

## 2015-12-29 DIAGNOSIS — D62 Acute posthemorrhagic anemia: Secondary | ICD-10-CM | POA: Diagnosis not present

## 2015-12-29 DIAGNOSIS — IMO0002 Reserved for concepts with insufficient information to code with codable children: Secondary | ICD-10-CM | POA: Diagnosis present

## 2015-12-29 DIAGNOSIS — I248 Other forms of acute ischemic heart disease: Secondary | ICD-10-CM | POA: Diagnosis present

## 2015-12-29 LAB — BASIC METABOLIC PANEL
Anion gap: 14 (ref 5–15)
BUN: 40 mg/dL — AB (ref 6–20)
CALCIUM: 9.1 mg/dL (ref 8.9–10.3)
CO2: 16 mmol/L — AB (ref 22–32)
Chloride: 105 mmol/L (ref 101–111)
Creatinine, Ser: 1.27 mg/dL — ABNORMAL HIGH (ref 0.61–1.24)
GFR calc Af Amer: >60 mL/min (ref >60)
GFR, EST NON AFRICAN AMERICAN: 57 mL/min — AB (ref >60)
GLUCOSE: 222 mg/dL — AB (ref 65–99)
Potassium: 4.7 mmol/L (ref 3.5–5.1)
Sodium: 135 mmol/L (ref 135–145)

## 2015-12-29 LAB — CBC
HEMATOCRIT: 19.5 % — AB (ref 39.0–52.0)
Hemoglobin: 6.5 g/dL — CL (ref 13.0–17.0)
MCH: 31.9 pg (ref 26.0–34.0)
MCHC: 33.3 g/dL (ref 30.0–36.0)
MCV: 95.6 fL (ref 78.0–100.0)
PLATELETS: 226 10*3/uL (ref 150–400)
RBC: 2.04 MIL/uL — ABNORMAL LOW (ref 4.22–5.81)
RDW: 16.2 % — AB (ref 11.5–15.5)
WBC: 24.9 10*3/uL — ABNORMAL HIGH (ref 4.0–10.5)

## 2015-12-29 LAB — PROTIME-INR
INR: 1.49 (ref 0.00–1.49)
Prothrombin Time: 18.1 seconds — ABNORMAL HIGH (ref 11.6–15.2)

## 2015-12-29 LAB — I-STAT TROPONIN, ED: Troponin i, poc: 0.05 ng/mL (ref 0.00–0.08)

## 2015-12-29 LAB — APTT: APTT: 29 s (ref 24–37)

## 2015-12-29 LAB — POC OCCULT BLOOD, ED: Fecal Occult Bld: POSITIVE — AB

## 2015-12-29 LAB — PREPARE RBC (CROSSMATCH)

## 2015-12-29 MED ORDER — LORAZEPAM 1 MG PO TABS: 1.0000 mg | ORAL_TABLET | Freq: Four times a day (QID) | ORAL | Status: AC | PRN

## 2015-12-29 MED ORDER — LORAZEPAM 2 MG/ML IJ SOLN: 0.0000 mg | Freq: Two times a day (BID) | INTRAMUSCULAR | Status: DC

## 2015-12-29 MED ORDER — LORAZEPAM 2 MG/ML IJ SOLN
0.0000 mg | Freq: Four times a day (QID) | INTRAMUSCULAR | Status: AC
Start: 2015-12-30 — End: 2015-12-31

## 2015-12-29 MED ORDER — FOLIC ACID 1 MG PO TABS
1.0000 mg | ORAL_TABLET | Freq: Every day | ORAL | Status: DC
  Administered 2015-12-30 – 2016-01-02 (×4): 1 mg via ORAL
  Filled 2015-12-29 (×4): qty 1

## 2015-12-29 MED ORDER — OCTREOTIDE LOAD VIA INFUSION
50.0000 ug | Freq: Once | INTRAVENOUS | Status: AC
  Administered 2015-12-29: 50 ug via INTRAVENOUS
  Filled 2015-12-29: qty 25

## 2015-12-29 MED ORDER — ONDANSETRON HCL 4 MG PO TABS: 4.0000 mg | ORAL_TABLET | Freq: Four times a day (QID) | ORAL | Status: DC | PRN

## 2015-12-29 MED ORDER — ADULT MULTIVITAMIN W/MINERALS CH
1.0000 | ORAL_TABLET | Freq: Every day | ORAL | Status: DC
  Administered 2015-12-30 – 2016-01-02 (×4): 1 via ORAL
  Filled 2015-12-29 (×4): qty 1

## 2015-12-29 MED ORDER — VITAMIN B-1 100 MG PO TABS
100.0000 mg | ORAL_TABLET | Freq: Every day | ORAL | Status: DC
  Administered 2015-12-30: 100 mg via ORAL
  Filled 2015-12-29: qty 1

## 2015-12-29 MED ORDER — SODIUM CHLORIDE 0.9 % IV SOLN
80.0000 mg | Freq: Once | INTRAVENOUS | Status: AC
  Administered 2015-12-30: 80 mg via INTRAVENOUS
  Filled 2015-12-29: qty 80

## 2015-12-29 MED ORDER — PANTOPRAZOLE SODIUM 40 MG IV SOLR
40.0000 mg | Freq: Once | INTRAVENOUS | Status: AC
  Administered 2015-12-29: 40 mg via INTRAVENOUS
  Filled 2015-12-29: qty 40

## 2015-12-29 MED ORDER — ONDANSETRON HCL 4 MG/2ML IJ SOLN: 4.0000 mg | Freq: Three times a day (TID) | INTRAMUSCULAR | Status: DC | PRN

## 2015-12-29 MED ORDER — LEVOFLOXACIN IN D5W 750 MG/150ML IV SOLN
750.0000 mg | INTRAVENOUS | Status: DC
  Administered 2015-12-30: 750 mg via INTRAVENOUS
  Filled 2015-12-29: qty 150

## 2015-12-29 MED ORDER — ONDANSETRON HCL 4 MG/2ML IJ SOLN: 4.0000 mg | Freq: Four times a day (QID) | INTRAMUSCULAR | Status: DC | PRN

## 2015-12-29 MED ORDER — PANTOPRAZOLE SODIUM 40 MG IV SOLR: 40.0000 mg | Freq: Two times a day (BID) | INTRAVENOUS | Status: DC

## 2015-12-29 MED ORDER — SODIUM CHLORIDE 0.9% FLUSH
3.0000 mL | Freq: Two times a day (BID) | INTRAVENOUS | Status: DC
  Administered 2015-12-30 – 2015-12-31 (×2): 3 mL via INTRAVENOUS
  Administered 2015-12-31: 6 mL via INTRAVENOUS
  Administered 2016-01-01 – 2016-01-02 (×2): 3 mL via INTRAVENOUS

## 2015-12-29 MED ORDER — LORAZEPAM 2 MG/ML IJ SOLN: 1.0000 mg | Freq: Four times a day (QID) | INTRAMUSCULAR | Status: AC | PRN

## 2015-12-29 MED ORDER — SODIUM CHLORIDE 0.9 % IV SOLN
8.0000 mg/h | INTRAVENOUS | Status: DC
  Administered 2015-12-30: 8 mg/h via INTRAVENOUS
  Filled 2015-12-29 (×4): qty 80

## 2015-12-29 MED ORDER — OCTREOTIDE ACETATE 500 MCG/ML IJ SOLN
50.0000 ug/h | INTRAMUSCULAR | Status: DC
  Administered 2015-12-30 (×2): 50 ug/h via INTRAVENOUS
  Filled 2015-12-29 (×5): qty 1

## 2015-12-29 MED ORDER — SODIUM CHLORIDE 0.9 % IV SOLN
Freq: Once | INTRAVENOUS | Status: AC
  Administered 2015-12-29: 20:00:00 via INTRAVENOUS

## 2015-12-29 MED ORDER — THIAMINE HCL 100 MG/ML IJ SOLN
100.0000 mg | Freq: Every day | INTRAMUSCULAR | Status: DC
Start: 2015-12-30 — End: 2016-01-02
  Administered 2015-12-31 – 2016-01-01 (×2): 100 mg via INTRAVENOUS
  Filled 2015-12-29 (×2): qty 2

## 2015-12-29 NOTE — Progress Notes (Signed)
ANTIBIOTIC CONSULT NOTE - INITIAL  Pharmacy Consult for Levaquin Indication:  SBP coverage  Allergies  Allergen Reactions  . Penicillins     Rash     Patient Measurements: Height: 6\' 2"  (188 cm) Weight: 193 lb 4.8 oz (87.68 kg) IBW/kg (Calculated) : 82.2  Vital Signs: Temp: 98.3 F (36.8 C) (01/26 2231) Temp Source: Oral (01/26 2231) BP: 116/47 mmHg (01/26 2231) Pulse Rate: 122 (01/26 2231)  Labs:  Recent Labs  12/29/15 1831  WBC 24.9*  HGB 6.5*  PLT 226  CREATININE 1.27*   Estimated Creatinine Clearance: 66.5 mL/min (by C-G formula based on Cr of 1.27). No results for input(s): VANCOTROUGH, VANCOPEAK, VANCORANDOM, GENTTROUGH, GENTPEAK, GENTRANDOM, TOBRATROUGH, TOBRAPEAK, TOBRARND, AMIKACINPEAK, AMIKACINTROU, AMIKACIN in the last 72 hours.   Microbiology: No results found for this or any previous visit (from the past 720 hour(s)).  Medical History: Past Medical History  Diagnosis Date  . GERD (gastroesophageal reflux disease)   . Hypertension   . Anemia   . COPD (chronic obstructive pulmonary disease) ([place])   . Symptomatic anemia     a. 07/2015 - felt multifactorial from cirrhosis and AVMs.  . Diastolic CHF ([place])   . Cirrhosis of liver ([place])   . Thrombocytopenia ([place])   . Tobacco use   . Alcohol abuse   . Hypokalemia   . Portal hypertensive gastropathy     a. By EGD 07/2015.  . Colon polyps   . Sigmoid diverticulosis   . Internal hemorrhoids   . AVM (arteriovenous malformation) of colon     a. By colonoscopy 07/2015.  Marland Kitchen SVT (supraventricular tachycardia) ([place])     a. 07/2015 in setting of severe hypokalemia.    Assessment: 65 year old male to begin Levaquin for SBP coverage Renal function stable  Plan:  Levaquin 750 mg iv Q 24 hours Pharmacy to sign off and follow peripherally for changes in renal function  Thank you Anette Guarneri, PharmD [phone]  12/29/2015,11:06 PM

## 2015-12-29 NOTE — ED Notes (Addendum)
Pt reports SOB starting for a week with worsening yesterday. Pt states that he has had cough and congestion. Pt reports generalized chest pain as well. Pt also reports dark stools with intermitent bright red bleeding

## 2015-12-29 NOTE — ED Notes (Signed)
Pt. signed consent form  for blood transfusion .  

## 2015-12-29 NOTE — ED Provider Notes (Signed)
CSN: [id number]     Arrival date & time 12/29/15  1812 History   First MD Initiated Contact with Patient 12/29/15 1923     Chief Complaint  Patient presents with  . Shortness of Breath     (Consider location/radiation/quality/duration/timing/severity/associated sxs/prior Treatment) HPI   John Warren is a 65 y.o. male, with a history of anemia, previous blood transfusion, cirrhosis of the liver, CHF, COPD, presenting to the ED with shortness of breath that began about a week ago. Pt also complains of both melena and hematochezia for the last week. Patient states that he received a blood transfusion for symptomatic anemia last August. Patient denies chest pain, recent illness, hemoptysis or hematemesis, dizziness, LOC, trauma, or any other complaints.    Past Medical History  Diagnosis Date  . GERD (gastroesophageal reflux disease)   . Hypertension   . Anemia   . COPD (chronic obstructive pulmonary disease) ([place])   . Symptomatic anemia     a. 07/2015 - felt multifactorial from cirrhosis and AVMs.  . Diastolic CHF ([place])   . Cirrhosis of liver ([place])   . Thrombocytopenia ([place])   . Tobacco use   . Alcohol abuse   . Hypokalemia   . Portal hypertensive gastropathy     a. By EGD 07/2015.  . Colon polyps   . Sigmoid diverticulosis   . Internal hemorrhoids   . AVM (arteriovenous malformation) of colon     a. By colonoscopy 07/2015.  Marland Kitchen SVT (supraventricular tachycardia) ([place])     a. 07/2015 in setting of severe hypokalemia.   Past Surgical History  Procedure Laterality Date  . Cataract extraction    . Hernia repair    . Esophagogastroduodenoscopy N/A 07/15/2015    Procedure: ESOPHAGOGASTRODUODENOSCOPY (EGD);  Surgeon: Wilford Corner, MD;  Location: [facility] ENDOSCOPY;  Service: Endoscopy;  Laterality: N/A;  . Colonoscopy N/A 07/15/2015    Procedure: COLONOSCOPY;  Surgeon: Wilford Corner, MD;  Location: [facility] ENDOSCOPY;  Service: Endoscopy;  Laterality: N/A;   No family history on  file. Social History  Substance Use Topics  . Smoking status: Former Smoker -- 2.00 packs/day for 40 years    Types: Cigarettes    Quit date: 08/14/2008  . Smokeless tobacco: Never Used  . Alcohol Use: 0.0 oz/week    0 Standard drinks or equivalent per week     Comment: 1 pint whiskey daily several beer weekly     Review of Systems  Constitutional: Negative for fever and chills.  Respiratory: Positive for shortness of breath.   Cardiovascular: Negative for chest pain.  Gastrointestinal: Positive for blood in stool. Negative for nausea, vomiting and abdominal pain.  Neurological: Negative for dizziness, syncope and light-headedness.  All other systems reviewed and are negative.     Allergies  Penicillins  Home Medications   Prior to Admission medications   Medication Sig Start Date End Date Taking? Authorizing Provider  acetaminophen (TYLENOL) 500 MG tablet Take 1,000 mg by mouth every 6 (six) hours as needed for mild pain.   Yes Historical Provider, MD  Aspirin Effervescent (ALKA-SELTZER PO) Take 2 tablets by mouth daily as needed (heartburn).   Yes Historical Provider, MD  Famotidine (PEPCID PO) Take 1 tablet by mouth daily as needed (heartburn).   Yes Historical Provider, MD  ferrous sulfate 325 (65 FE) MG tablet Take 1 tablet (325 mg total) by mouth 3 (three) times daily with meals. 07/17/15  Yes Dayna N Dunn, PA-C  furosemide (LASIX) 80 MG tablet Take 80 mg  by mouth daily.   Yes Historical Provider, MD  nitroGLYCERIN (NITROSTAT) 0.4 MG SL tablet Place 0.4 mg under the tongue every 5 (five) minutes as needed for chest pain.   Yes Historical Provider, MD  omeprazole (PRILOSEC) 40 MG capsule Take 40 mg by mouth every evening.   Yes Historical Provider, MD  potassium chloride SA (K-DUR,KLOR-CON) 20 MEQ tablet Take 2 tablets (40 mEq total) by mouth daily. 07/17/15  Yes Dayna N Dunn, PA-C  spironolactone (ALDACTONE) 25 MG tablet Take 1 tablet (25 mg total) by mouth daily. 07/17/15   Yes Dayna N Dunn, PA-C   BP 135/55 mmHg  Pulse 120  Temp(Src) 98.5 F (36.9 C) (Oral)  Resp 14  SpO2 100% Physical Exam  Constitutional: He is oriented to person, place, and time. He appears well-developed and well-nourished. No distress.  HENT:  Head: Normocephalic and atraumatic.  Eyes: Conjunctivae are normal. Pupils are equal, round, and reactive to light.  Neck: Normal range of motion. Neck supple.  Cardiovascular: Regular rhythm, normal heart sounds and intact distal pulses.  Tachycardia present.   Pulmonary/Chest: Breath sounds normal. Tachypnea noted. No respiratory distress.  Increased work of breathing noted, but patient speaks in full sentences.  Abdominal: Soft. Bowel sounds are normal. There is no tenderness.  Genitourinary: Guaiac positive stool.  Rectal exam performed with Mortimer Fries, RN as chaperone. Scant dark stool found but no gross blood. Hemoccult was positive.  Musculoskeletal: He exhibits no edema or tenderness.  Lymphadenopathy:    He has no cervical adenopathy.  Neurological: He is alert and oriented to person, place, and time.  Skin: Skin is warm and dry. He is not diaphoretic.  Nursing note and vitals reviewed.   ED Course  Procedures (including critical care time) Labs Review Labs Reviewed  BASIC METABOLIC PANEL - Abnormal; Notable for the following:    CO2 16 (*)    Glucose, Bld 222 (*)    BUN 40 (*)    Creatinine, Ser 1.27 (*)    GFR calc non Af Amer 57 (*)    All other components within normal limits  CBC - Abnormal; Notable for the following:    WBC 24.9 (*)    RBC 2.04 (*)    Hemoglobin 6.5 (*)    HCT 19.5 (*)    RDW 16.2 (*)    All other components within normal limits  PROTIME-INR - Abnormal; Notable for the following:    Prothrombin Time 18.1 (*)    All other components within normal limits  POC OCCULT BLOOD, ED - Abnormal; Notable for the following:    Fecal Occult Bld POSITIVE (*)    All other components within normal limits  APTT   I-STAT TROPOININ, ED  TYPE AND SCREEN  PREPARE RBC (CROSSMATCH)   HEMOGLOBIN  Date Value Ref Range Status  12/29/2015 6.5* 13.0 - 17.0 g/dL Final    Comment:    REPEATED TO VERIFY CRITICAL RESULT CALLED TO, READ BACK BY AND VERIFIED WITHDelane Ginger SIMMIONS RN [phone] 1857 GREEN R   07/17/2015 8.8* 13.0 - 17.0 g/dL Final  07/16/2015 7.2* 13.0 - 17.0 g/dL Final  07/15/2015 8.1* 13.0 - 17.0 g/dL Final     Imaging Review Dg Chest 2 View  12/29/2015  CLINICAL DATA:  Chest pain, shortness of breath for 3 days EXAM: CHEST  2 VIEW COMPARISON:  07/12/2015 FINDINGS: Cardiomediastinal silhouette is stable. Again noted hyperinflation and chronic mild interstitial prominence. Streaky right infrahilar bronchitic changes or early infiltrate. No pulmonary edema. Stable  degenerative changes thoracic spine. IMPRESSION: Again noted hyperinflation and chronic mild interstitial prominence. No pulmonary edema. Streaky right infrahilar bronchitic changes or early infiltrate. Electronically Signed   By: Lahoma Crocker M.D.   On: 12/29/2015 19:04   I have personally reviewed and evaluated these images and lab results as part of my medical decision-making.   EKG Interpretation None      MDM   Final diagnoses:  Symptomatic anemia  Shortness of breath    Osie Bond presents with shortness of breath and rectal bleeding for the past week.  Findings and plan of care discussed with Leo Grosser, MD.  Patient's hemoglobin level was noted. Patient endorses evidence of a GI bleed. GI was consulted. Patient received information about the advantage of allowing a blood transfusion as well as the possible risks. Patient stated understanding of this information and all questions were answered. Patient agreed to the blood transfusion. Blood blank was also called for notification. Blood transfusion was ordered. EKG shows sinus tachycardia. 8:21 PM Spoke with Dr. Hal Hope, who agreed to admit the patient, requested  Protonix IV and admission to stepdown.   Filed Vitals:   12/29/15 1930 12/29/15 1945 12/29/15 2000 12/29/15 2015  BP:  136/59 116/63 135/55  Pulse: 116 120 115 120  Temp:      TempSrc:      Resp: 18 14 13 14   SpO2: 100% 97% 100% 100%     Lorayne Bender, PA-C 12/29/15 2054  Leo Grosser, MD 12/30/15 [phone]

## 2015-12-30 ENCOUNTER — Encounter (HOSPITAL_COMMUNITY)
Admission: EM | Disposition: A | Discharge status: Home / Self Care | Payer: Self-pay | Source: Home / Self Care | Attending: Internal Medicine

## 2015-12-30 ENCOUNTER — Ambulatory Visit (HOSPITAL_COMMUNITY): Payer: Medicare Other

## 2015-12-30 ENCOUNTER — Encounter (HOSPITAL_COMMUNITY): Payer: Self-pay

## 2015-12-30 DIAGNOSIS — K922 Gastrointestinal hemorrhage, unspecified: Secondary | ICD-10-CM

## 2015-12-30 DIAGNOSIS — R609 Edema, unspecified: Secondary | ICD-10-CM

## 2015-12-30 HISTORY — PX: ESOPHAGOGASTRODUODENOSCOPY: SHX5428

## 2015-12-30 LAB — TROPONIN I
TROPONIN I: 0.09 ng/mL — AB (ref <0.031)
TROPONIN I: 0.09 ng/mL — AB (ref <0.031)
TROPONIN I: 0.11 ng/mL — AB (ref <0.031)
TROPONIN I: 0.09 ng/mL — AB (ref <0.031)

## 2015-12-30 LAB — CBC WITH DIFFERENTIAL/PLATELET
BASOS ABS: 0 10*3/uL (ref 0.0–0.1)
Basophils Relative: 0 %
EOS PCT: 1 %
Eosinophils Absolute: 0.1 10*3/uL (ref 0.0–0.7)
HEMATOCRIT: 22.8 % — AB (ref 39.0–52.0)
Hemoglobin: 7.6 g/dL — ABNORMAL LOW (ref 13.0–17.0)
LYMPHS PCT: 19 %
Lymphs Abs: 2 10*3/uL (ref 0.7–4.0)
MCH: 30.6 pg (ref 26.0–34.0)
MCHC: 33.3 g/dL (ref 30.0–36.0)
MCV: 91.9 fL (ref 78.0–100.0)
MONO ABS: 1.3 10*3/uL — AB (ref 0.1–1.0)
MONOS PCT: 13 %
NEUTROS ABS: 6.9 10*3/uL (ref 1.7–7.7)
Neutrophils Relative %: 67 %
PLATELETS: 103 10*3/uL — AB (ref 150–400)
RBC: 2.48 MIL/uL — ABNORMAL LOW (ref 4.22–5.81)
RDW: 16.9 % — AB (ref 11.5–15.5)
WBC: 10.1 10*3/uL (ref 4.0–10.5)

## 2015-12-30 LAB — COMPREHENSIVE METABOLIC PANEL
ALBUMIN: 2.3 g/dL — AB (ref 3.5–5.0)
ALK PHOS: 68 U/L (ref 38–126)
ALT: 23 U/L (ref 17–63)
AST: 28 U/L (ref 15–41)
Anion gap: 5 (ref 5–15)
BILIRUBIN TOTAL: 1.2 mg/dL (ref 0.3–1.2)
BUN: 30 mg/dL — AB (ref 6–20)
CALCIUM: 7.9 mg/dL — AB (ref 8.9–10.3)
CO2: 19 mmol/L — AB (ref 22–32)
Chloride: 115 mmol/L — ABNORMAL HIGH (ref 101–111)
Creatinine, Ser: 1.01 mg/dL (ref 0.61–1.24)
GFR calc Af Amer: >60 mL/min (ref >60)
GFR calc non Af Amer: >60 mL/min (ref >60)
GLUCOSE: 126 mg/dL — AB (ref 65–99)
POTASSIUM: 4 mmol/L (ref 3.5–5.1)
SODIUM: 139 mmol/L (ref 135–145)
TOTAL PROTEIN: 4.6 g/dL — AB (ref 6.5–8.1)

## 2015-12-30 LAB — HEMOGLOBIN AND HEMATOCRIT, BLOOD
HCT: 22.9 % — ABNORMAL LOW (ref 39.0–52.0)
HCT: 23.3 % — ABNORMAL LOW (ref 39.0–52.0)
Hemoglobin: 7.4 g/dL — ABNORMAL LOW (ref 13.0–17.0)
Hemoglobin: 7.6 g/dL — ABNORMAL LOW (ref 13.0–17.0)

## 2015-12-30 LAB — GLUCOSE, CAPILLARY
GLUCOSE-CAPILLARY: 110 mg/dL — AB (ref 65–99)
GLUCOSE-CAPILLARY: 124 mg/dL — AB (ref 65–99)
GLUCOSE-CAPILLARY: 99 mg/dL (ref 65–99)
Glucose-Capillary: 136 mg/dL — ABNORMAL HIGH (ref 65–99)
Glucose-Capillary: 128 mg/dL — ABNORMAL HIGH (ref 65–99)

## 2015-12-30 LAB — MRSA PCR SCREENING: MRSA BY PCR: NEGATIVE

## 2015-12-30 SURGERY — EGD (ESOPHAGOGASTRODUODENOSCOPY)
Anesthesia: Moderate Sedation

## 2015-12-30 MED ORDER — DIPHENHYDRAMINE HCL 50 MG/ML IJ SOLN
INTRAMUSCULAR | Status: AC
  Filled 2015-12-30: qty 1

## 2015-12-30 MED ORDER — MIDAZOLAM HCL 5 MG/ML IJ SOLN
INTRAMUSCULAR | Status: AC
  Filled 2015-12-30: qty 2

## 2015-12-30 MED ORDER — MIDAZOLAM HCL 10 MG/2ML IJ SOLN
INTRAMUSCULAR | Status: DC | PRN
  Administered 2015-12-30: 2 mg via INTRAVENOUS

## 2015-12-30 MED ORDER — CEFTRIAXONE SODIUM 1 G IJ SOLR
1.0000 g | INTRAMUSCULAR | Status: DC
  Administered 2015-12-30 – 2016-01-02 (×4): 1 g via INTRAVENOUS
  Filled 2015-12-30 (×4): qty 10

## 2015-12-30 MED ORDER — DIPHENHYDRAMINE HCL 50 MG/ML IJ SOLN
INTRAMUSCULAR | Status: DC | PRN
  Administered 2015-12-30: 25 mg via INTRAVENOUS

## 2015-12-30 MED ORDER — PANTOPRAZOLE SODIUM 40 MG PO TBEC
40.0000 mg | DELAYED_RELEASE_TABLET | Freq: Every day | ORAL | Status: DC
Start: 2015-12-30 — End: 2016-01-02
  Administered 2015-12-30 – 2016-01-02 (×4): 40 mg via ORAL
  Filled 2015-12-30 (×4): qty 1

## 2015-12-30 MED ORDER — FENTANYL CITRATE (PF) 100 MCG/2ML IJ SOLN
INTRAMUSCULAR | Status: DC | PRN
  Administered 2015-12-30: 25 ug via INTRAVENOUS

## 2015-12-30 MED ORDER — BUTAMBEN-TETRACAINE-BENZOCAINE 2-2-14 % EX AERO
INHALATION_SPRAY | CUTANEOUS | Status: DC | PRN
  Administered 2015-12-30: 2 via TOPICAL

## 2015-12-30 MED ORDER — FENTANYL CITRATE (PF) 100 MCG/2ML IJ SOLN
INTRAMUSCULAR | Status: AC
  Filled 2015-12-30: qty 2

## 2015-12-30 NOTE — Op Note (Signed)
[facility] [place], [id number]   ENDOSCOPY PROCEDURE REPORT  PATIENT: John, Warren  MR#: [id number] BIRTHDATE: [birth year] , 49  yrs. old GENDER: male ENDOSCOPIST:Tonja Jezewski Pony, MD REFERRED BY: Dr Claris Gower PROCEDURE DATE:  01-20-2016 PROCEDURE:   upper endoscopy ASA CLASS:    III INDICATIONS: hematochezia in a patient with known extensive diverticulosis, but also, a history of early cirrhosis and portal gastropathy with ongoing alcohol use and recent aspirin (Alka-Seltzer) exposure MEDICATION: Benadryl 25 mg, fentanyl 25 g, and Versed 5 mg IV  TOPICAL ANESTHETIC:   Cetacaine spray  DESCRIPTION OF PROCEDURE:   The patient was brought from his hospital room to the [facility] cone endoscopy unit. After the risks and benefits of the procedure were explained, informed consent was obtained.  The [id number]  endoscope was introduced through the mouth  and advanced to the second portion of the duodenum . The instrument was slowly withdrawn as the mucosa was fully examined. Estimated blood loss is zero unless otherwise noted in this procedure report.    The larynx looked grossly normal.  The esophagus was entered under direct vision without difficulty. The distal esophagus had the question of a solitary small varix, which would essentially completely flatten out with insufflation.the esophagus was otherwise normal, without evidence of Mallory-Weiss tear, reflux esophagitis, hiatal hernia, inflammation, Barrett's changes, or neoplasia.  The stomach contained no blood or coffee-ground material. There was punctate erythema along the greater curve and in the fundus, suggestive of portal congestive gastropathy. No gastritis, erosions, ulcers, polyps, or masses were observed.  Retroflexion raised  the question of some ropey folds in the cardia, possibly raising the question of early gastric varices.  The pylorus, duodenal bulb,  and second duodenum looked normal.  The scope was then withdrawn from the patient and the procedure completed. No biopsies were obtained, and no interventions were performed.  COMPLICATIONS: There were no immediate complications.  ENDOSCOPIC IMPRESSION: 1. No active bleeding or blood in the stomach at the time this procedure. 2. Portal gastropathy as previously observed 3. 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 Based on the bright red reported appearance of the patient's stool, and the absence of elevation of his BUN, I feel that his bleeding was most likely a lower tract origin, even though he does have findings in the upper tract which could potentially lead to bleeding.  RECOMMENDATIONS: 1. Continue observation for now 2. Okay to stop octreotide and start diet 3. If the patient develops recurrent significant rebleeding, consider bleeding scan with possible arteriography if positive. He might be a candidate for embolization if a diverticular source of bleeding was identified.   _______________________________ eSignedRonald Lobo, MD Jan 20, 2016 1:53 PM     cc:  CPT CODES: ICD CODES:  The ICD and CPT codes recommended by this software are interpretations from the data that the clinical staff has captured with the software.  The verification of the translation of this report to the ICD and CPT codes and modifiers is the sole responsibility of the health care institution and practicing physician where this report was generated.  [employer]. will not be held responsible for the validity of the ICD and CPT codes included on this report.  AMA assumes no liability for data contained or not contained herein. CPT is a Designer, television/film set of the [employer].  PATIENT NAME:  John, Warren MR#: [id number]

## 2015-12-30 NOTE — Consult Note (Signed)
Referring Provider:  Dr. Lala Lund Primary Care Physician:  Dr. Claris Gower Primary Gastroenterologist:  Dr. Cannon Kettle  Reason for Consultation:  GI bleeding  HPI: John Warren is a 65 y.o. male admitted through the emergency room yesterday following near-syncope, shortness of breath, and chest pain with mild exertion which occurred following multiple episodes of dark stools which then became frankly bloody 2 days ago. The patient has a history of alcohol related liver disease with evidence of portal hypertensive gastropathy on endoscopy last August (no evidence of varices at that time). Ultrasound shows some nodularity of the liver and mild splenomegaly consistent with early cirrhosis. Platelet count has been normal, as has been INR. On presentation to the hospital yesterday, the patient's hemoglobin was 6.5, as compared to 8.8 at time of hospital discharge about 6 months ago. Interim hemoglobin levels are not available.  The patient has not had any bleeding or passage of stool, by his report, for approximately 36 hours.  The patient has received 2 units of blood with a nearly appropriate rise in hemoglobin.  He has not had frank abdominal pain. There is no history of exposure to aspirin or nonsteroidal anti-inflammatory drugs.  The patient has moderated his alcohol consumption to some degree, but still has a pint of liquor roughly every 3 days.  The patient had colonoscopy at the same time as his endoscopy last August, and it showed several potential sources of hematochezia, including possible vascular malformations in the proximal colon, as well as pancolonic extensive diverticulosis. Coincidentally, he had a large adenomatous polyp removed at the time of that exam.   Past Medical History  Diagnosis Date  . GERD (gastroesophageal reflux disease)   . Hypertension   . Anemia   . COPD (chronic obstructive pulmonary disease) ([place])   . Symptomatic anemia     a. 07/2015 - felt  multifactorial from cirrhosis and AVMs.  . Diastolic CHF ([place])   . Cirrhosis of liver ([place])   . Thrombocytopenia ([place])   . Tobacco use   . Alcohol abuse   . Hypokalemia   . Portal hypertensive gastropathy     a. By EGD 07/2015.  . Colon polyps   . Sigmoid diverticulosis   . Internal hemorrhoids   . AVM (arteriovenous malformation) of colon     a. By colonoscopy 07/2015.  Marland Kitchen SVT (supraventricular tachycardia) ([place])     a. 07/2015 in setting of severe hypokalemia.    Past Surgical History  Procedure Laterality Date  . Cataract extraction    . Hernia repair    . Esophagogastroduodenoscopy N/A 07/15/2015    Procedure: ESOPHAGOGASTRODUODENOSCOPY (EGD);  Surgeon: Wilford Corner, MD;  Location: [facility] ENDOSCOPY;  Service: Endoscopy;  Laterality: N/A;  . Colonoscopy N/A 07/15/2015    Procedure: COLONOSCOPY;  Surgeon: Wilford Corner, MD;  Location: [facility] ENDOSCOPY;  Service: Endoscopy;  Laterality: N/A;    Prior to Admission medications   Medication Sig Start Date End Date Taking? Authorizing Provider  acetaminophen (TYLENOL) 500 MG tablet Take 1,000 mg by mouth every 6 (six) hours as needed for mild pain.   Yes Historical Provider, MD  Aspirin Effervescent (ALKA-SELTZER PO) Take 2 tablets by mouth daily as needed (heartburn).   Yes Historical Provider, MD  Famotidine (PEPCID PO) Take 1 tablet by mouth daily as needed (heartburn).   Yes Historical Provider, MD  ferrous sulfate 325 (65 FE) MG tablet Take 1 tablet (325 mg total) by mouth 3 (three) times daily with meals. 07/17/15  Yes Dayna  N Dunn, PA-C  furosemide (LASIX) 80 MG tablet Take 80 mg by mouth daily.   Yes Historical Provider, MD  nitroGLYCERIN (NITROSTAT) 0.4 MG SL tablet Place 0.4 mg under the tongue every 5 (five) minutes as needed for chest pain.   Yes Historical Provider, MD  omeprazole (PRILOSEC) 40 MG capsule Take 40 mg by mouth every evening.   Yes Historical Provider, MD  potassium chloride SA (K-DUR,KLOR-CON) 20 MEQ tablet Take  2 tablets (40 mEq total) by mouth daily. 07/17/15  Yes Dayna N Dunn, PA-C  spironolactone (ALDACTONE) 25 MG tablet Take 1 tablet (25 mg total) by mouth daily. 07/17/15  Yes Dayna N Dunn, PA-C    Current Facility-Administered Medications  Medication Dose Route Frequency Provider Last Rate Last Dose  . folic acid (FOLVITE) tablet 1 mg  1 mg Oral Daily Rise Patience, MD      . levofloxacin (LEVAQUIN) IVPB 750 mg  750 mg Intravenous Q24H Rise Patience, MD   750 mg at 12/30/15 0019  . LORazepam (ATIVAN) injection 0-4 mg  0-4 mg Intravenous Q6H Rise Patience, MD   0 mg at 12/30/15 0000   Followed by  . [START ON 01/01/2016] LORazepam (ATIVAN) injection 0-4 mg  0-4 mg Intravenous Q12H Rise Patience, MD      . LORazepam (ATIVAN) tablet 1 mg  1 mg Oral Q6H PRN Rise Patience, MD       Or  . LORazepam (ATIVAN) injection 1 mg  1 mg Intravenous Q6H PRN Rise Patience, MD      . multivitamin with minerals tablet 1 tablet  1 tablet Oral Daily Rise Patience, MD      . octreotide (SANDOSTATIN) 500 mcg in sodium chloride 0.9 % 250 mL (2 mcg/mL) infusion  50 mcg/hr Intravenous Continuous Rise Patience, MD 25 mL/hr at 12/30/15 1002 50 mcg/hr at 12/30/15 1002  . ondansetron (ZOFRAN) tablet 4 mg  4 mg Oral Q6H PRN Rise Patience, MD       Or  . ondansetron [facility]) injection 4 mg  4 mg Intravenous Q6H PRN Rise Patience, MD      . pantoprazole (PROTONIX) 80 mg in sodium chloride 0.9 % 250 mL (0.32 mg/mL) infusion  8 mg/hr Intravenous Continuous Rise Patience, MD 25 mL/hr at 12/30/15 0020 8 mg/hr at 12/30/15 0020  . [START ON 01/02/2016] pantoprazole (PROTONIX) injection 40 mg  40 mg Intravenous Q12H Rise Patience, MD      . sodium chloride flush (NS) 0.9 % injection 3 mL  3 mL Intravenous Q12H Rise Patience, MD   3 mL at 12/30/15 0012  . thiamine (VITAMIN B-1) tablet 100 mg  100 mg Oral Daily Rise Patience, MD       Or  . thiamine (B-1)  injection 100 mg  100 mg Intravenous Daily Rise Patience, MD        Allergies as of 12/29/2015 - Review Complete 12/29/2015  Allergen Reaction Noted  . Penicillins  07/11/2015    Family History  Problem Relation Age of Onset  . Liver cancer Mother   . Diabetes Mellitus II Sister     Social History   Social History  . Marital Status: Single    Spouse Name: N/A  . Number of Children: N/A  . Years of Education: N/A   Occupational History  . Not on file.   Social History Main Topics  . Smoking status: Former Smoker -- 2.00  packs/day for 40 years    Types: Cigarettes    Quit date: 08/14/2008  . Smokeless tobacco: Never Used  . Alcohol Use: 0.0 oz/week    0 Standard drinks or equivalent per week     Comment: 1 pint whiskey daily several beer weekly   . Drug Use: No  . Sexual Activity: Not on file   Other Topics Concern  . Not on file   Social History Narrative    Review of Systems: Chest pain, shortness of breath (now resolved), some reflux symptomatology Physical Exam: Vital signs in last 24 hours: Temp:  [97.6 F (36.4 C)-99.2 F (37.3 C)] 97.9 F (36.6 C) (01/27 0845) Pulse Rate:  [90-125] 90 (01/27 0845) Resp:  [0-22] 16 (01/27 0845) BP: (85-136)/(35-64) 87/41 mmHg (01/27 0845) SpO2:  [97 %-100 %] 100 % (01/27 0845) Weight:  [87.68 kg (193 lb 4.8 oz)-87.9 kg (193 lb 12.6 oz)] 87.9 kg (193 lb 12.6 oz) (01/27 0433) Last BM Date: 12/29/15 General:   Alert,  Well-developed, well-nourished, pleasant and cooperative in NAD Head:  Normocephalic and atraumatic. Eyes:  Sclera clear, no icterus.   Conjunctiva pale. Mouth:   No ulcerations or lesions.  Oropharynx pink & moist. Neck:   No masses or thyromegaly. Lungs:  Clear throughout to auscultation.   No wheezes, crackles, or rhonchi. No evident respiratory distress. Heart:   Regular rate and rhythm; 2/6 apical SEM Abdomen:  Soft, nontender, nontympanitic, and nondistended. No masses, hepatosplenomegaly or  ventral hernias noted. No evident ascites. Rectal:  Not performed   Msk:   Symmetrical without gross deformities. Some tenderness to palpation of the left ankle region. Pulses:  Normal radial pulse is noted. Extremities:   Without clubbing, cyanosis, or edema. Neurologic:  Alert and coherent;  grossly normal neurologically. Skin:  Intact without significant lesions or rashes. Warm, well perfused. Cervical Nodes:  No significant cervical adenopathy. Psych:   Alert and cooperative. Normal mood and affect.  Intake/Output from previous day: 01/26 0701 - 01/27 0700 In: 1496.7 [I.V.:341.7; Blood:1005; IV Piggyback:150] Out: 700 [Urine:700] Intake/Output this shift: Total I/O In: 700 [I.V.:700] Out: -   Lab Results:  Recent Labs  12/29/15 1831 12/30/15 0742  WBC 24.9* 10.1  HGB 6.5* 7.6*  HCT 19.5* 22.8*  PLT 226 103*   BMET  Recent Labs  12/29/15 1831 12/30/15 0742  NA 135 139  K 4.7 4.0  CL 105 115*  CO2 16* 19*  GLUCOSE 222* 126*  BUN 40* 30*  CREATININE 1.27* 1.01  CALCIUM 9.1 7.9*   LFT  Recent Labs  12/30/15 0742  PROT 4.6*  ALBUMIN 2.3*  AST 28  ALT 23  ALKPHOS 68  BILITOT 1.2   PT/INR  Recent Labs  12/29/15 2026  LABPROT 18.1*  INR 1.49    Studies/Results: Dg Chest 2 View  12/29/2015  CLINICAL DATA:  Chest pain, shortness of breath for 3 days EXAM: CHEST  2 VIEW COMPARISON:  07/12/2015 FINDINGS: Cardiomediastinal silhouette is stable. Again noted hyperinflation and chronic mild interstitial prominence. Streaky right infrahilar bronchitic changes or early infiltrate. No pulmonary edema. Stable degenerative changes thoracic spine. IMPRESSION: Again noted hyperinflation and chronic mild interstitial prominence. No pulmonary edema. Streaky right infrahilar bronchitic changes or early infiltrate. Electronically Signed   By: Lahoma Crocker M.D.   On: 12/29/2015 19:04    Impression: Large volume hematochezia of what sounds like fresh blood. This sounds  clinically like a diverticular bleed which has now stopped.  Plan: Endoscopic evaluation today to  confirm the absence of any stigmata of hemorrhage in the upper tract. Risks reviewed, patient agreeable. Depending on those findings, it may be possible to stop the patient's octreotide and manage him expectantly, with observation and transfusion support as needed.   However, if he shows evidence of brisk rebleeding and the upper endoscopy is unrevealing for source of major hemorrhage, I would consider a bleeding scan, to be followed by arteriography and embolization, if there is evidence of an active diverticular bleeding site.   At this time, I am not inclined to repeat the patient's colonoscopy, since it has been only 6 months and he does not have symptoms to suggest ischemic colitis.   LOS: 1 day   Melayah Skorupski V  12/30/2015, 10:06 AM   Pager [phone] If no answer or after 5 PM call [phone]

## 2015-12-30 NOTE — Progress Notes (Signed)
Utilization Review Completed.John Warren T1/27/2017  

## 2015-12-30 NOTE — Progress Notes (Signed)
VASCULAR LAB PRELIMINARY  PRELIMINARY  PRELIMINARY  PRELIMINARY    Bilateral lower extremity venous Doppler has been completed.    Bilateral:  No evidence of DVT, superficial thrombosis, or Baker's Cyst.    Janifer Adie, RVT, RDMS 12/30/2015, 3:25 PM

## 2015-12-30 NOTE — H&P (Addendum)
[facility] History and Physical  John Warren [id number] DOB: [birth year] DOA: 12/29/2015  Referring physician: Mr.Joy. PCP: No primary care provider on file.  Specialists: Dr.Schooler. GI.  Chief Complaint: Shortness of breath and rectal bleeding.  HPI: John Warren is a 65 y.o. male with history of cirrhosis of liver, alcohol abuse, diastolic dysfunction presents to the ER because of shortness of breath and chest pain. Patient states over the last 1 week has been noticing black stools and yesterday had at least 4 episodes of frank rectal bleeding. Today patient was getting increasingly short of breath on walking with chest pain. Patient also felt dizzy and had fallen but denies hitting his head or losing consciousness. In the ER patient's hemoglobin was found to be on 6 with stool occult positive and patient tachycardic. Patient has been admitted for a GI bleed with history of cirrhosis of liver. Patient was admitted for GI bleed in August 2016 when at that time patient had EGD which showed portal gastropathy and colonoscopy showed polyps with sigmoid diverticulosis and AVMs. Patient denies taking any aspirin or blood thinners. Denies taking any NSAIDs. On exam patient also has lower extremity edema which patient states is chronic.   Review of Systems: As presented in the history of presenting illness, rest negative.  Past Medical History  Diagnosis Date  . GERD (gastroesophageal reflux disease)   . Hypertension   . Anemia   . COPD (chronic obstructive pulmonary disease) ([place])   . Symptomatic anemia     a. 07/2015 - felt multifactorial from cirrhosis and AVMs.  . Diastolic CHF ([place])   . Cirrhosis of liver ([place])   . Thrombocytopenia ([place])   . Tobacco use   . Alcohol abuse   . Hypokalemia   . Portal hypertensive gastropathy     a. By EGD 07/2015.  . Colon polyps   . Sigmoid diverticulosis   . Internal hemorrhoids   . AVM (arteriovenous malformation) of colon     a. By  colonoscopy 07/2015.  Marland Kitchen SVT (supraventricular tachycardia) ([place])     a. 07/2015 in setting of severe hypokalemia.   Past Surgical History  Procedure Laterality Date  . Cataract extraction    . Hernia repair    . Esophagogastroduodenoscopy N/A 07/15/2015    Procedure: ESOPHAGOGASTRODUODENOSCOPY (EGD);  Surgeon: Wilford Corner, MD;  Location: [facility] ENDOSCOPY;  Service: Endoscopy;  Laterality: N/A;  . Colonoscopy N/A 07/15/2015    Procedure: COLONOSCOPY;  Surgeon: Wilford Corner, MD;  Location: [facility] ENDOSCOPY;  Service: Endoscopy;  Laterality: N/A;   Social History:  reports that he quit smoking about 7 years ago. His smoking use included Cigarettes. He has a 80 pack-year smoking history. He has never used smokeless tobacco. He reports that he drinks alcohol. He reports that he does not use illicit drugs. Where does patient live home. Can patient participate in ADLs? Yes.  Allergies  Allergen Reactions  . Penicillins     Rash     Family History:  Family History  Problem Relation Age of Onset  . Liver cancer Mother   . Diabetes Mellitus II Sister       Prior to Admission medications   Medication Sig Start Date End Date Taking? Authorizing Provider  acetaminophen (TYLENOL) 500 MG tablet Take 1,000 mg by mouth every 6 (six) hours as needed for mild pain.   Yes Historical Provider, MD  Aspirin Effervescent (ALKA-SELTZER PO) Take 2 tablets by mouth daily as needed (heartburn).   Yes Historical Provider, MD  Famotidine (PEPCID PO) Take 1 tablet by mouth daily as needed (heartburn).   Yes Historical Provider, MD  ferrous sulfate 325 (65 FE) MG tablet Take 1 tablet (325 mg total) by mouth 3 (three) times daily with meals. 07/17/15  Yes Dayna N Dunn, PA-C  furosemide (LASIX) 80 MG tablet Take 80 mg by mouth daily.   Yes Historical Provider, MD  nitroGLYCERIN (NITROSTAT) 0.4 MG SL tablet Place 0.4 mg under the tongue every 5 (five) minutes as needed for chest pain.   Yes Historical Provider, MD   omeprazole (PRILOSEC) 40 MG capsule Take 40 mg by mouth every evening.   Yes Historical Provider, MD  potassium chloride SA (K-DUR,KLOR-CON) 20 MEQ tablet Take 2 tablets (40 mEq total) by mouth daily. 07/17/15  Yes Dayna N Dunn, PA-C  spironolactone (ALDACTONE) 25 MG tablet Take 1 tablet (25 mg total) by mouth daily. 07/17/15  Yes Charlie Pitter, PA-C    Physical Exam: Filed Vitals:   12/30/15 0200 12/30/15 0300 12/30/15 0432 12/30/15 0433  BP: 92/38 94/36 105/56   Pulse: 103 103 101   Temp:   99.2 F (37.3 C)   TempSrc:   Oral   Resp: 18 20 15    Height:      Weight:    87.9 kg (193 lb 12.6 oz)  SpO2: 100% 100% 100%      General:  Moderately built and nourished.  Eyes: Anicteric mild pallor.  ENT: No discharge from the ears eyes nose and mouth.  Neck: No mass felt.  Cardiovascular: S1-S2 tachycardia.  Respiratory: No rhonchi or crepitations.  Abdomen: Soft nontender bowel sounds present.  Skin: Chronic skin changes.  Musculoskeletal: Bilateral lower extremity edema more on the left side.  Psychiatric: Appears normal.  Neurologic: Alert awake oriented to time place and person. Moves all extremities.  Labs on Admission:  Basic Metabolic Panel:  Recent Labs Lab 12/29/15 1831  NA 135  K 4.7  CL 105  CO2 16*  GLUCOSE 222*  BUN 40*  CREATININE 1.27*  CALCIUM 9.1   Liver Function Tests: No results for input(s): AST, ALT, ALKPHOS, BILITOT, PROT, ALBUMIN in the last 168 hours. No results for input(s): LIPASE, AMYLASE in the last 168 hours. No results for input(s): AMMONIA in the last 168 hours. CBC:  Recent Labs Lab 12/29/15 1831  WBC 24.9*  HGB 6.5*  HCT 19.5*  MCV 95.6  PLT 226   Cardiac Enzymes: No results for input(s): CKTOTAL, CKMB, CKMBINDEX, TROPONINI in the last 168 hours.  BNP (last 3 results)  Recent Labs  07/11/15 1700 07/11/15 2146  BNP 327.0* 331.3*    ProBNP (last 3 results) No results for input(s): PROBNP in the last 8760  hours.  CBG:  Recent Labs Lab 12/30/15 0035  GLUCAP 136*    Radiological Exams on Admission: Dg Chest 2 View  12/29/2015  CLINICAL DATA:  Chest pain, shortness of breath for 3 days EXAM: CHEST  2 VIEW COMPARISON:  07/12/2015 FINDINGS: Cardiomediastinal silhouette is stable. Again noted hyperinflation and chronic mild interstitial prominence. Streaky right infrahilar bronchitic changes or early infiltrate. No pulmonary edema. Stable degenerative changes thoracic spine. IMPRESSION: Again noted hyperinflation and chronic mild interstitial prominence. No pulmonary edema. Streaky right infrahilar bronchitic changes or early infiltrate. Electronically Signed   By: Lahoma Crocker M.D.   On: 12/29/2015 19:04    EKG: Independently reviewed. Sinus tachycardia.  Assessment/Plan Principal Problem:   Acute GI bleeding Active Problems:   AVM (arteriovenous malformation) of colon  Portal hypertensive gastropathy   Alcohol abuse   Acute blood loss anemia   Liver cirrhosis ([place])   1. Acute GI bleed - patient has had both melena and frank rectal bleeding. Source not clear. I have place patient nothing by mouth and on Protonix and octreotide infusion. Patient is receiving 2 units of packed red blood cells transfusion. Patient will be kept nothing by mouth in anticipation of possible EGD. Patient has an EGD in August 2016 which showed portal gastropathy and colonoscopy showed sigmoid diverticulosis and AVMs.. Follow CBC after transfusion. Since patient has cirrhosis of the liver patient will be on empiric antibiotics. 2. Acute blood loss anemia - follow CBC after transfusion. 3. Cirrhosis of the liver secondary to alcoholism - patient states he takes spironolactone and Lasix. Since patient's blood pressures in the low normal holding off diuretics for now. 4. Leukocytosis with possible pneumonia -  chest x-ray shows possible infiltrate. Closely follow up CBC and respiratory status. Patient is on Levaquin.   5. History of diastolic CHF last EF measured was 55-60% with grade 2 diastolic dysfunction - patient at this time is receiving PRBC. Closely monitor respiratory status. Diuretics on hold secondary to blood pressure being in the low normals. 6. Hyperglycemia - check hemoglobin A1c. 7. Alcohol abuse - patient has been placed on CIWA protocol.  Lower extremity Doppler ordered since patient has significant lower extremity edema and more on the left lower extremity.   DVT Prophylaxis SCDs.  Code Status: Full code.  Family Communication: Discussed with patient.  Disposition Plan: Admit to inpatient.    KAKRAKANDY,ARSHAD N. [facility] Pager [phone].  If 7PM-7AM, please contact night-coverage [email or web address] Password [email or web address] 12/30/2015, 5:14 AM

## 2015-12-30 NOTE — Progress Notes (Signed)
Patient Demographics:    John Warren, is a 65 y.o. male, DOB - 14-Dec-1950, [id number]  Admit date - 12/29/2015   Admitting Physician Rise Patience, MD  Outpatient Primary MD for the patient is No primary care provider on file.  LOS - 1   Chief Complaint  Patient presents with  . Shortness of Breath        Subjective:    [name] today has, No headache, No chest pain, No abdominal pain - No Nausea, No new weakness tingling or numbness, No Cough - SOB.     Assessment  & Plan :     1. Acute most likely upper GI bleed. History of portal hypertension with esophageal varices & colonic AVM - currently in stepdown, status post 2 units of packed RBC transfusion on 12/29/2015 with stable H&H, continue IV octreotide and Protonix, continue Rocephin for SBP prophylaxis. GI physician Dr. Cristina Gong consulted likely to undergo EGD later on 12/30/2015. We'll monitor H&H and transfuse as needed if hemoglobin drops below 7.   2. Acute blood loss related anemia. As above.   3. History of alcoholic cirrhosis with ongoing alcohol abuse. Counseled to quit alcohol, on CIWA protocol, SBP prophylaxis with Rocephin, GI on board. Blood pressure too low for beta blocker.   4. History of chronic diastolic CHF EF [id number] on recent echogram. Does have trace ascites and edema but blood pressure too low. Once per pressure improves we'll add diuretics.   5. Mild elevation of troponin. Likely demand ischemia from anemia, trend troponin, chest pain-free, EKG nonacute, recent echo noted. No aspirin due to ongoing bleeding, no beta blocker due to low blood pressure. Supportive care at this time.    Code Status : Full  Family Communication  : None  Disposition Plan  : Stepdown  Consults  :  GI- Buccini  Procedures  :     TTE August 2016  Left ventricle: The cavity size was normal. There was moderatefocal basal hypertrophy. Systolic function was normal. Theestimated ejection fraction was in the range of 55% to 60%. Wallmotion was normal; there were no regional wall motionabnormalities. Features are consistent with a pseudonormal leftventricular filling pattern, with concomitant abnormal relaxationand increased filling pressure (grade 2 diastolic dysfunction). Doppler parameters are consistent with high ventricular fillingpressure. - Aortic valve: Moderate thickening and calcification, consistentwith sclerosis. There was mild regurgitation. Valve area (Vmax): 1.71 cm^2. - Mitral valve: Calcified annulus. Mild thickening andcalcification. There was trivial regurgitation.   EGD   DVT Prophylaxis  :   SCDs    Lab Results  Component Value Date   PLT 103* 12/30/2015    Inpatient Medications  Scheduled Meds: . cefTRIAXone (ROCEPHIN)  IV  1 g Intravenous Q24H  . folic acid  1 mg Oral Daily  . LORazepam  0-4 mg Intravenous Q6H   Followed by  . [START ON 01/01/2016] LORazepam  0-4 mg Intravenous Q12H  . multivitamin with minerals  1 tablet Oral Daily  . [START ON 01/02/2016] pantoprazole (PROTONIX) IV  40 mg Intravenous Q12H  . sodium chloride flush  3 mL Intravenous Q12H  . thiamine  100 mg Intravenous Daily   Continuous Infusions: . octreotide  (SANDOSTATIN)    IV infusion 50 mcg/hr (12/30/15 1002)  .  pantoprozole (PROTONIX) infusion 8 mg/hr (12/30/15 0020)   PRN Meds:.LORazepam **OR** LORazepam, [DISCONTINUED] ondansetron **OR** ondansetron (ZOFRAN) IV  Antibiotics  :     Anti-infectives    Start     Dose/Rate Route Frequency Ordered Stop   12/30/15 1030  cefTRIAXone (ROCEPHIN) 1 g in dextrose 5 % 50 mL IVPB     1 g 100 mL/hr over 30 Minutes Intravenous Every 24 hours 12/30/15 1021     12/30/15 0000  levofloxacin (LEVAQUIN) IVPB 750 mg  Status:  Discontinued     750 mg 100 mL/hr  over 90 Minutes Intravenous Every 24 hours 12/29/15 2306 12/30/15 1021        Objective:   Filed Vitals:   12/30/15 0432 12/30/15 0433 12/30/15 0500 12/30/15 0845  BP: 105/56  89/35 87/41  Pulse: 101  98 90  Temp: 99.2 F (37.3 C)   97.9 F (36.6 C)  TempSrc: Oral   Oral  Resp: 15  16 16   Height:      Weight:  87.9 kg (193 lb 12.6 oz)    SpO2: 100%  100% 100%    Wt Readings from Last 3 Encounters:  12/30/15 87.9 kg (193 lb 12.6 oz)  07/17/15 88.724 kg (195 lb 9.6 oz)     Intake/Output Summary (Last 24 hours) at 12/30/15 1023 Last data filed at 12/30/15 0900  Gross per 24 hour  Intake 2196.67 ml  Output    700 ml  Net 1496.67 ml     Physical Exam  Awake Alert, Oriented X 3, No new F.N deficits, Normal affect [place].AT,PERRAL Supple Neck,No JVD, No cervical lymphadenopathy appriciated.  Symmetrical Chest wall movement, Good air movement bilaterally, CTAB RRR,No Gallops,Rubs or new Murmurs, No Parasternal Heave +ve B.Sounds, Abd Soft, No tenderness, No organomegaly appriciated, No rebound - guarding or rigidity. No Cyanosis, Clubbing , 1+ edema, No new Rash or bruise       Data Review:   Micro Results Recent Results (from the past 240 hour(s))  MRSA PCR Screening     Status: None   Collection Time: 12/29/15  9:54 PM  Result Value Ref Range Status   MRSA by PCR NEGATIVE NEGATIVE Final    Comment:        The GeneXpert MRSA Assay (FDA approved for NASAL specimens only), is one component of a comprehensive MRSA colonization surveillance program. It is not intended to diagnose MRSA infection nor to guide or monitor treatment for MRSA infections.     Radiology Reports Dg Chest 2 View  12/29/2015  CLINICAL DATA:  Chest pain, shortness of breath for 3 days EXAM: CHEST  2 VIEW COMPARISON:  07/12/2015 FINDINGS: Cardiomediastinal silhouette is stable. Again noted hyperinflation and chronic mild interstitial prominence. Streaky right infrahilar bronchitic changes or  early infiltrate. No pulmonary edema. Stable degenerative changes thoracic spine. IMPRESSION: Again noted hyperinflation and chronic mild interstitial prominence. No pulmonary edema. Streaky right infrahilar bronchitic changes or early infiltrate. Electronically Signed   By: Lahoma Crocker M.D.   On: 12/29/2015 19:04     CBC  Recent Labs Lab 12/29/15 1831 12/30/15 0742  WBC 24.9* 10.1  HGB 6.5* 7.6*  HCT 19.5* 22.8*  PLT 226 103*  MCV 95.6 91.9  MCH 31.9 30.6  MCHC 33.3 33.3  RDW 16.2* 16.9*  LYMPHSABS  --  2.0  MONOABS  --  1.3*  EOSABS  --  0.1  BASOSABS  --  0.0    Chemistries   Recent Labs Lab 12/29/15 1831 12/30/15  0742  NA 135 139  K 4.7 4.0  CL 105 115*  CO2 16* 19*  GLUCOSE 222* 126*  BUN 40* 30*  CREATININE 1.27* 1.01  CALCIUM 9.1 7.9*  AST  --  28  ALT  --  23  ALKPHOS  --  68  BILITOT  --  1.2   ------------------------------------------------------------------------------------------------------------------ No results for input(s): CHOL, HDL, LDLCALC, TRIG, CHOLHDL, LDLDIRECT in the last 72 hours.  No results found for: HGBA1C ------------------------------------------------------------------------------------------------------------------ No results for input(s): TSH, T4TOTAL, T3FREE, THYROIDAB in the last 72 hours.  Invalid input(s): FREET3 ------------------------------------------------------------------------------------------------------------------ No results for input(s): VITAMINB12, FOLATE, FERRITIN, TIBC, IRON, RETICCTPCT in the last 72 hours.  Coagulation profile  Recent Labs Lab 12/29/15 2026  INR 1.49    No results for input(s): DDIMER in the last 72 hours.  Cardiac Enzymes  Recent Labs Lab 12/30/15 0742  TROPONINI 0.09*   ------------------------------------------------------------------------------------------------------------------    Component Value Date/Time   BNP 331.3* 07/11/2015 2146    Time Spent in minutes   35   Pricsilla Lindvall K M.D on 12/30/2015 at 10:23 AM  Between 7am to 7pm - Pager - [phone]  After 7pm go to [email or web address] - password [facility]  [facility] -  Office  [phone]

## 2015-12-30 NOTE — Progress Notes (Signed)
Patient's endoscopy was well tolerated and showed changes consistent with those seen 6 months ago in terms of portal gastropathy and possible small esophageal varix (questionable gastric varix).  Based on this finding, and the absence of any blood in the upper GI tract, and the normal BUN at time of presentation, and the character of the patient's blood (bright red) when he bled 2 days ago, I am fairly confident that this was a diverticular bleed.  I have stopped his octreotide, switched him to oral Protonix, and started a full liquid diet.  In the event of recurrent active bleeding, I think our next step would be a tagged red cell scan, followed by arteriography and possible embolization, if positive.  Cleotis Nipper, M.D. Pager [phone] If no answer or after 5 PM call [phone]

## 2015-12-31 LAB — BASIC METABOLIC PANEL
ANION GAP: 6 (ref 5–15)
BUN: 21 mg/dL — ABNORMAL HIGH (ref 6–20)
CHLORIDE: 111 mmol/L (ref 101–111)
CO2: 20 mmol/L — AB (ref 22–32)
Calcium: 7.8 mg/dL — ABNORMAL LOW (ref 8.9–10.3)
Creatinine, Ser: 0.92 mg/dL (ref 0.61–1.24)
GFR calc non Af Amer: >60 mL/min (ref >60)
GLUCOSE: 109 mg/dL — AB (ref 65–99)
Potassium: 4 mmol/L (ref 3.5–5.1)
Sodium: 137 mmol/L (ref 135–145)

## 2015-12-31 LAB — GLUCOSE, CAPILLARY
GLUCOSE-CAPILLARY: 107 mg/dL — AB (ref 65–99)
GLUCOSE-CAPILLARY: 119 mg/dL — AB (ref 65–99)

## 2015-12-31 LAB — CBC
HEMATOCRIT: 22.9 % — AB (ref 39.0–52.0)
HEMOGLOBIN: 7.5 g/dL — AB (ref 13.0–17.0)
MCH: 30.4 pg (ref 26.0–34.0)
MCHC: 32.8 g/dL (ref 30.0–36.0)
MCV: 92.7 fL (ref 78.0–100.0)
Platelets: 96 10*3/uL — ABNORMAL LOW (ref 150–400)
RBC: 2.47 MIL/uL — ABNORMAL LOW (ref 4.22–5.81)
RDW: 17.6 % — AB (ref 11.5–15.5)
WBC: 5.5 10*3/uL (ref 4.0–10.5)

## 2015-12-31 LAB — HEMOGLOBIN AND HEMATOCRIT, BLOOD
HEMATOCRIT: 29.1 % — AB (ref 39.0–52.0)
HEMOGLOBIN: 9.9 g/dL — AB (ref 13.0–17.0)

## 2015-12-31 LAB — HEMOGLOBIN A1C
Hgb A1c MFr Bld: 5.6 % (ref 4.8–5.6)
MEAN PLASMA GLUCOSE: 114 mg/dL

## 2015-12-31 LAB — PREPARE RBC (CROSSMATCH)

## 2015-12-31 MED ORDER — FUROSEMIDE 40 MG PO TABS
40.0000 mg | ORAL_TABLET | Freq: Two times a day (BID) | ORAL | Status: DC
  Administered 2015-12-31 – 2016-01-02 (×5): 40 mg via ORAL
  Filled 2015-12-31 (×5): qty 1

## 2015-12-31 MED ORDER — SODIUM CHLORIDE 0.9 % IV SOLN
Freq: Once | INTRAVENOUS | Status: AC
  Administered 2015-12-31: 09:00:00 via INTRAVENOUS

## 2015-12-31 NOTE — Progress Notes (Signed)
Report given to [facility] on [place] is to be transferred to 5W05 once pt's blood transfusion finishes up. Hoover Brunette, RN

## 2015-12-31 NOTE — Progress Notes (Signed)
Patient Demographics:    John Warren, is a 65 y.o. male, DOB - 12-01-1951, [id number]  Admit date - 12/29/2015   Admitting Physician Rise Patience, MD  Outpatient Primary MD for the patient is No primary care provider on file.  LOS - 2   Chief Complaint  Patient presents with  . Shortness of Breath        Subjective:    John Warren today has, No headache, No chest pain, No abdominal pain - No Nausea, No new weakness tingling or numbness, No Cough - SOB.     Assessment  & Plan :     1. Acute most likely upper GI bleed. History of portal hypertension with esophageal varices & colonic AVM - currently in stepdown, status post 2 units of packed RBC transfusion on 12/29/2015, although hemoglobin is 7.5 is still having some shortness of breath and chest pressure on exertion intermittently will transfuse another unit on 12/31/2015 and monitor H&H. He underwent EGD yesterday which did not show any active bleed but did confirm evidence of esophageal and possible gastric Varices, GI on board, now on by mouth PPI, monitor closely clear liquid diet.   2. Acute blood loss related anemia. As above.   3. History of alcoholic cirrhosis with ongoing alcohol abuse. Counseled to quit alcohol, on CIWA protocol, SBP prophylaxis with Rocephin, GI on board. Blood pressure too low for beta blocker. Baseline blood pressure is low for the patient in low 100s.   4. History of chronic diastolic CHF EF [id number] on recent echogram. Does have trace ascites and edema but blood pressure too low. Once per pressure improves we'll add diuretics. Resume low-dose Lasix.   5. Mild elevation of troponin. Likely demand ischemia from anemia, troponin rise in non-ACS pattern, chest pain-free at rest, EKG nonacute, recent echo noted. No  aspirin due to ongoing bleeding, no beta blocker due to low blood pressure. Supportive care at this time. Follow with his primary cardiologist Dr. Wynonia Lawman post discharge.    Code Status : Full  Family Communication  : None  Disposition Plan  : Stepdown  Consults  :  GI- Buccini  Procedures  :   TTE August 2016  Left ventricle: The cavity size was normal. There was moderatefocal basal hypertrophy. Systolic function was normal. Theestimated ejection fraction was in the range of 55% to 60%. Wallmotion was normal; there were no regional wall motionabnormalities. Features are consistent with a pseudonormal leftventricular filling pattern, with concomitant abnormal relaxationand increased filling pressure (grade 2 diastolic dysfunction). Doppler parameters are consistent with high ventricular fillingpressure. - Aortic valve: Moderate thickening and calcification, consistentwith sclerosis. There was mild regurgitation. Valve area (Vmax): 1.71 cm^2. - Mitral valve: Calcified annulus. Mild thickening andcalcification. There was trivial regurgitation.   EGD  ENDOSCOPIC IMPRESSION: 1. No active bleeding or blood in the stomach at the time thisprocedure. 2. Portal gastropathy as previously observed 3. 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 Based on the bright red reported appearance of the patient's stool, and the absence of elevation of his BUN, I feel that his bleeding was most likely a lower tract origin, even though he does have findings in the upper tract which could potentially lead to bleeding.  RECOMMENDATIONS:  1. Continue observation for now 2. Okay to stop octreotide and start diet 3. If the patient develops recurrent significant rebleeding, consider bleeding scan with possible arteriography if positive. He might be a candidate for embolization if a diverticular source of bleeding was identified.   DVT Prophylaxis  :   SCDs    Lab  Results  Component Value Date   PLT 96* 12/31/2015    Inpatient Medications  Scheduled Meds: . sodium chloride   Intravenous Once  . cefTRIAXone (ROCEPHIN)  IV  1 g Intravenous Q24H  . folic acid  1 mg Oral Daily  . furosemide  40 mg Oral BID  . LORazepam  0-4 mg Intravenous Q6H   Followed by  . [START ON 01/01/2016] LORazepam  0-4 mg Intravenous Q12H  . multivitamin with minerals  1 tablet Oral Daily  . pantoprazole  40 mg Oral Q0600  . sodium chloride flush  3 mL Intravenous Q12H  . thiamine  100 mg Intravenous Daily   Continuous Infusions:   PRN Meds:.LORazepam **OR** LORazepam, [DISCONTINUED] ondansetron **OR** ondansetron (ZOFRAN) IV  Antibiotics  :     Anti-infectives    Start     Dose/Rate Route Frequency Ordered Stop   12/30/15 1030  cefTRIAXone (ROCEPHIN) 1 g in dextrose 5 % 50 mL IVPB     1 g 100 mL/hr over 30 Minutes Intravenous Every 24 hours 12/30/15 1021     12/30/15 0000  levofloxacin (LEVAQUIN) IVPB 750 mg  Status:  Discontinued     750 mg 100 mL/hr over 90 Minutes Intravenous Every 24 hours 12/29/15 2306 12/30/15 1021        Objective:   Filed Vitals:   12/31/15 0000 12/31/15 0349 12/31/15 0500 12/31/15 0800  BP: 94/53 115/62  120/62  Pulse: 86 82  87  Temp: 97.9 F (36.6 C) 97.9 F (36.6 C)  97.8 F (36.6 C)  TempSrc: Oral Oral  Oral  Resp: 14 14  18   Height:      Weight:   88.905 kg (196 lb)   SpO2: 95% 97%  98%    Wt Readings from Last 3 Encounters:  12/31/15 88.905 kg (196 lb)  07/17/15 88.724 kg (195 lb 9.6 oz)     Intake/Output Summary (Last 24 hours) at 12/31/15 0840 Last data filed at 12/31/15 0500  Gross per 24 hour  Intake   3690 ml  Output   1305 ml  Net   2385 ml     Physical Exam  Awake Alert, Oriented X 3, No new F.N deficits, Normal affect [place].AT,PERRAL Supple Neck,No JVD, No cervical lymphadenopathy appriciated.  Symmetrical Chest wall movement, Good air movement bilaterally, CTAB RRR,No Gallops,Rubs or new  Murmurs, No Parasternal Heave +ve B.Sounds, Abd Soft, No tenderness, No organomegaly appriciated, No rebound - guarding or rigidity. No Cyanosis, Clubbing , 2+ edema, No new Rash or bruise       Data Review:   Micro Results Recent Results (from the past 240 hour(s))  MRSA PCR Screening     Status: None   Collection Time: 12/29/15  9:54 PM  Result Value Ref Range Status   MRSA by PCR NEGATIVE NEGATIVE Final    Comment:        The GeneXpert MRSA Assay (FDA approved for NASAL specimens only), is one component of a comprehensive MRSA colonization surveillance program. It is not intended to diagnose MRSA infection nor to guide or monitor treatment for MRSA infections.     Radiology Reports Dg Chest  2 View  12/29/2015  CLINICAL DATA:  Chest pain, shortness of breath for 3 days EXAM: CHEST  2 VIEW COMPARISON:  07/12/2015 FINDINGS: Cardiomediastinal silhouette is stable. Again noted hyperinflation and chronic mild interstitial prominence. Streaky right infrahilar bronchitic changes or early infiltrate. No pulmonary edema. Stable degenerative changes thoracic spine. IMPRESSION: Again noted hyperinflation and chronic mild interstitial prominence. No pulmonary edema. Streaky right infrahilar bronchitic changes or early infiltrate. Electronically Signed   By: Lahoma Crocker M.D.   On: 12/29/2015 19:04     CBC  Recent Labs Lab 12/29/15 1831 12/30/15 0742 12/30/15 1510 12/30/15 2017 12/31/15 0258  WBC 24.9* 10.1  --   --  5.5  HGB 6.5* 7.6* 7.4* 7.6* 7.5*  HCT 19.5* 22.8* 22.9* 23.3* 22.9*  PLT 226 103*  --   --  96*  MCV 95.6 91.9  --   --  92.7  MCH 31.9 30.6  --   --  30.4  MCHC 33.3 33.3  --   --  32.8  RDW 16.2* 16.9*  --   --  17.6*  LYMPHSABS  --  2.0  --   --   --   MONOABS  --  1.3*  --   --   --   EOSABS  --  0.1  --   --   --   BASOSABS  --  0.0  --   --   --     Chemistries   Recent Labs Lab 12/29/15 1831 12/30/15 0742 12/31/15 0258  NA 135 139 137  K 4.7 4.0  4.0  CL 105 115* 111  CO2 16* 19* 20*  GLUCOSE 222* 126* 109*  BUN 40* 30* 21*  CREATININE 1.27* 1.01 0.92  CALCIUM 9.1 7.9* 7.8*  AST  --  28  --   ALT  --  23  --   ALKPHOS  --  68  --   BILITOT  --  1.2  --    ------------------------------------------------------------------------------------------------------------------ No results for input(s): CHOL, HDL, LDLCALC, TRIG, CHOLHDL, LDLDIRECT in the last 72 hours.  No results found for: HGBA1C ------------------------------------------------------------------------------------------------------------------ No results for input(s): TSH, T4TOTAL, T3FREE, THYROIDAB in the last 72 hours.  Invalid input(s): FREET3 ------------------------------------------------------------------------------------------------------------------ No results for input(s): VITAMINB12, FOLATE, FERRITIN, TIBC, IRON, RETICCTPCT in the last 72 hours.  Coagulation profile  Recent Labs Lab 12/29/15 2026  INR 1.49    No results for input(s): DDIMER in the last 72 hours.  Cardiac Enzymes  Recent Labs Lab 12/30/15 1200 12/30/15 1510 12/30/15 2205  TROPONINI 0.09* 0.11* 0.09*   ------------------------------------------------------------------------------------------------------------------    Component Value Date/Time   BNP 331.3* 07/11/2015 2146    Time Spent in minutes  35   SINGH,PRASHANT K M.D on 12/31/2015 at 8:40 AM  Between 7am to 7pm - Pager - [phone]  After 7pm go to [email or web address] - password [facility]  [facility] -  Office  [phone]

## 2015-12-31 NOTE — Progress Notes (Signed)
GASTROENTEROLOGY PROGRESS NOTE  Problem:   GI bleeding, presumed diverticular (egd showed no blood or stigmata of hemorrhage, although chronic changes associated with portal hypertension were noted).  Subjective: No bowel movements. Some chest pain when moving around. Consuming full liquid diet.  Objective: Hemoglobin stable for the past 24 hours at 7.5  Assessment: As noted yesterday, I think this is probably a diverticular bleed, and that the "black" component to his stools when he had bleeding was due to his iron therapy.  Plan: 1. Will advance to regular diet. 2. If this was a diverticular bleed, there is a moderate chance, probably about 30%, of recurrent bleeding in the next couple of days. I would recommend observing the patient in-house for at least another 1-2 days. 3. If acute rebleeding occurs, would probably get a bleeding scan to clarify origin from upper versus lower GI tract, with consideration for IR embolization if it is from the lower tract. 4. Consider cardiology consultation in view of chest pain with anemia.  Cleotis Nipper, M.D. 12/31/2015 9:33 AM  Pager [phone] If no answer or after 5 PM call [phone]

## 2016-01-01 LAB — TYPE AND SCREEN
ABO/RH(D): O POS
ANTIBODY SCREEN: NEGATIVE
UNIT DIVISION: 0
UNIT DIVISION: 0
Unit division: 0

## 2016-01-01 LAB — GLUCOSE, CAPILLARY
GLUCOSE-CAPILLARY: 148 mg/dL — AB (ref 65–99)
Glucose-Capillary: 131 mg/dL — ABNORMAL HIGH (ref 65–99)
Glucose-Capillary: 115 mg/dL — ABNORMAL HIGH (ref 65–99)
Glucose-Capillary: 103 mg/dL — ABNORMAL HIGH (ref 65–99)

## 2016-01-01 LAB — BASIC METABOLIC PANEL
ANION GAP: 8 (ref 5–15)
BUN: 14 mg/dL (ref 6–20)
CHLORIDE: 103 mmol/L (ref 101–111)
CO2: 25 mmol/L (ref 22–32)
Calcium: 7.8 mg/dL — ABNORMAL LOW (ref 8.9–10.3)
Creatinine, Ser: 0.91 mg/dL (ref 0.61–1.24)
GFR calc Af Amer: >60 mL/min (ref >60)
Glucose, Bld: 127 mg/dL — ABNORMAL HIGH (ref 65–99)
POTASSIUM: 2.9 mmol/L — AB (ref 3.5–5.1)
SODIUM: 136 mmol/L (ref 135–145)

## 2016-01-01 LAB — CBC
HCT: 30.8 % — ABNORMAL LOW (ref 39.0–52.0)
HEMOGLOBIN: 9.9 g/dL — AB (ref 13.0–17.0)
MCH: 29.7 pg (ref 26.0–34.0)
MCHC: 32.1 g/dL (ref 30.0–36.0)
MCV: 92.5 fL (ref 78.0–100.0)
PLATELETS: 93 10*3/uL — AB (ref 150–400)
RBC: 3.33 MIL/uL — AB (ref 4.22–5.81)
RDW: 16 % — ABNORMAL HIGH (ref 11.5–15.5)
WBC: 5.7 10*3/uL (ref 4.0–10.5)

## 2016-01-01 LAB — MAGNESIUM: MAGNESIUM: 2.3 mg/dL (ref 1.7–2.4)

## 2016-01-01 MED ORDER — POTASSIUM CHLORIDE 10 MEQ/100ML IV SOLN
10.0000 meq | INTRAVENOUS | Status: AC
  Administered 2016-01-01 (×2): 10 meq via INTRAVENOUS
  Filled 2016-01-01 (×2): qty 100

## 2016-01-01 MED ORDER — POTASSIUM CHLORIDE CRYS ER 20 MEQ PO TBCR
40.0000 meq | EXTENDED_RELEASE_TABLET | ORAL | Status: AC
  Administered 2016-01-01 (×2): 40 meq via ORAL
  Filled 2016-01-01 (×3): qty 2

## 2016-01-01 NOTE — Progress Notes (Signed)
Patient Demographics:    John Warren, is a 65 y.o. male, DOB - [birth year], [id number]  Admit date - 12/29/2015   Admitting Physician Rise Patience, MD  Outpatient Primary MD for the patient is No primary care provider on file.  LOS - 3   Chief Complaint  Patient presents with  . Shortness of Breath        Subjective:    John Warren today has, No headache, No chest pain, No abdominal pain - No Nausea, No new weakness tingling or numbness, No Cough - SOB.     Assessment  & Plan :     1. Acute most likely upper GI bleed. History of portal hypertension with esophageal varices & colonic AVM - currently in stepdown, status post 2 units of packed RBC transfusion on 12/29/2015, although hemoglobin is 7.5 is still having some shortness of breath and chest pressure on exertion intermittently he received another unit on 12/31/2015, now completely symptom free with stable H&H.   He underwent EGD yesterday which did not show any active bleed but did confirm evidence of esophageal and possible gastric Varices, GI on board, now on by mouth PPI, now on regular diet. Discussed with GI Dr. Cristina Gong on 01/01/2016. If stable discharge in the morning.   2. Acute blood loss related anemia. As above.   3. History of alcoholic cirrhosis with ongoing alcohol abuse. Counseled to quit alcohol, on CIWA protocol, SBP prophylaxis with Rocephin, GI on board. Blood pressure too low for beta blocker. Baseline blood pressure is low for the patient in low 100s.   4. History of chronic diastolic CHF EF [id number] on recent echogram. Does have trace ascites and edema but blood pressure too low. Once per pressure improves we'll add diuretics. Resume low-dose Lasix.   5. Mild elevation of troponin. Likely demand ischemia from  anemia, troponin rise in non-ACS pattern, chest pain-free at rest, EKG nonacute, recent echo noted. No aspirin due to ongoing bleeding, no beta blocker due to low blood pressure. Supportive care at this time. Follow with his primary cardiologist Dr. Wynonia Lawman post discharge. Repeat echo to evaluate wall motion and EF is pending.  6. Hypokalemia. Replace and recheck.    Code Status : Full  Family Communication  : None  Disposition Plan  : Discharge in the morning  Consults  :  GI- Buccini  Procedures  :   TTE August 2016  Left ventricle: The cavity size was normal. There was moderatefocal basal hypertrophy. Systolic function was normal. Theestimated ejection fraction was in the range of 55% to 60%. Wallmotion was normal; there were no regional wall motionabnormalities. Features are consistent with a pseudonormal leftventricular filling pattern, with concomitant abnormal relaxationand increased filling pressure (grade 2 diastolic dysfunction). Doppler parameters are consistent with high ventricular fillingpressure. - Aortic valve: Moderate thickening and calcification, consistentwith sclerosis. There was mild regurgitation. Valve area (Vmax): 1.71 cm^2. - Mitral valve: Calcified annulus. Mild thickening andcalcification. There was trivial regurgitation.   EGD  ENDOSCOPIC IMPRESSION: 1. No active bleeding or blood in the stomach at the time thisprocedure. 2. Portal gastropathy as previously observed 3. 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 Based on the bright red reported appearance of the patient's stool, and  the absence of elevation of his BUN, I feel that his bleeding was most likely a lower tract origin, even though he does have findings in the upper tract which could potentially lead to bleeding.  RECOMMENDATIONS: 1. Continue observation for now 2. Okay to stop octreotide and start diet 3. If the patient develops recurrent  significant rebleeding, consider bleeding scan with possible arteriography if positive. He might be a candidate for embolization if a diverticular source of bleeding was identified.   DVT Prophylaxis  :   SCDs    Lab Results  Component Value Date   PLT 93* 01/01/2016    Inpatient Medications  Scheduled Meds: . cefTRIAXone (ROCEPHIN)  IV  1 g Intravenous Q24H  . folic acid  1 mg Oral Daily  . furosemide  40 mg Oral BID  . LORazepam  0-4 mg Intravenous Q12H  . multivitamin with minerals  1 tablet Oral Daily  . pantoprazole  40 mg Oral Q0600  . potassium chloride  10 mEq Intravenous Q1 Hr x 2  . potassium chloride  40 mEq Oral Q4H  . sodium chloride flush  3 mL Intravenous Q12H  . thiamine  100 mg Intravenous Daily   Continuous Infusions:   PRN Meds:.LORazepam **OR** LORazepam, [DISCONTINUED] ondansetron **OR** ondansetron (ZOFRAN) IV  Antibiotics  :     Anti-infectives    Start     Dose/Rate Route Frequency Ordered Stop   12/30/15 1030  cefTRIAXone (ROCEPHIN) 1 g in dextrose 5 % 50 mL IVPB     1 g 100 mL/hr over 30 Minutes Intravenous Every 24 hours 12/30/15 1021     12/30/15 0000  levofloxacin (LEVAQUIN) IVPB 750 mg  Status:  Discontinued     750 mg 100 mL/hr over 90 Minutes Intravenous Every 24 hours 12/29/15 2306 12/30/15 1021        Objective:   Filed Vitals:   12/31/15 1455 01/01/16 0016 01/01/16 0501 01/01/16 0603  BP: 123/57 108/52  96/46  Pulse: 94 95  82  Temp: 97.9 F (36.6 C) 98.3 F (36.8 C)  98 F (36.7 C)  TempSrc: Oral Oral  Oral  Resp: 27 18  16   Height:      Weight:   88.451 kg (195 lb)   SpO2: 99% 100%  98%    Wt Readings from Last 3 Encounters:  01/01/16 88.451 kg (195 lb)  07/17/15 88.724 kg (195 lb 9.6 oz)     Intake/Output Summary (Last 24 hours) at 01/01/16 0954 Last data filed at 01/01/16 0926  Gross per 24 hour  Intake   1390 ml  Output   3625 ml  Net  -2235 ml     Physical Exam  Awake Alert, Oriented X 3, No new F.N  deficits, Normal affect [place].AT,PERRAL Supple Neck,No JVD, No cervical lymphadenopathy appriciated.  Symmetrical Chest wall movement, Good air movement bilaterally, CTAB RRR,No Gallops,Rubs or new Murmurs, No Parasternal Heave +ve B.Sounds, Abd Soft, No tenderness, No organomegaly appriciated, No rebound - guarding or rigidity. No Cyanosis, Clubbing , 2+ edema, No new Rash or bruise       Data Review:   Micro Results Recent Results (from the past 240 hour(s))  MRSA PCR Screening     Status: None   Collection Time: 12/29/15  9:54 PM  Result Value Ref Range Status   MRSA by PCR NEGATIVE NEGATIVE Final    Comment:        The GeneXpert MRSA Assay (FDA approved for NASAL specimens only), is  one component of a comprehensive MRSA colonization surveillance program. It is not intended to diagnose MRSA infection nor to guide or monitor treatment for MRSA infections.     Radiology Reports Dg Chest 2 View  12/29/2015  CLINICAL DATA:  Chest pain, shortness of breath for 3 days EXAM: CHEST  2 VIEW COMPARISON:  07/12/2015 FINDINGS: Cardiomediastinal silhouette is stable. Again noted hyperinflation and chronic mild interstitial prominence. Streaky right infrahilar bronchitic changes or early infiltrate. No pulmonary edema. Stable degenerative changes thoracic spine. IMPRESSION: Again noted hyperinflation and chronic mild interstitial prominence. No pulmonary edema. Streaky right infrahilar bronchitic changes or early infiltrate. Electronically Signed   By: Lahoma Crocker M.D.   On: 12/29/2015 19:04     CBC  Recent Labs Lab 12/29/15 1831 12/30/15 0742 12/30/15 1510 12/30/15 2017 12/31/15 0258 12/31/15 1816 01/01/16 0624  WBC 24.9* 10.1  --   --  5.5  --  5.7  HGB 6.5* 7.6* 7.4* 7.6* 7.5* 9.9* 9.9*  HCT 19.5* 22.8* 22.9* 23.3* 22.9* 29.1* 30.8*  PLT 226 103*  --   --  96*  --  93*  MCV 95.6 91.9  --   --  92.7  --  92.5  MCH 31.9 30.6  --   --  30.4  --  29.7  MCHC 33.3 33.3  --   --   32.8  --  32.1  RDW 16.2* 16.9*  --   --  17.6*  --  16.0*  LYMPHSABS  --  2.0  --   --   --   --   --   MONOABS  --  1.3*  --   --   --   --   --   EOSABS  --  0.1  --   --   --   --   --   BASOSABS  --  0.0  --   --   --   --   --     Chemistries   Recent Labs Lab 12/29/15 1831 12/30/15 0742 12/31/15 0258 01/01/16 0624  NA 135 139 137 136  K 4.7 4.0 4.0 2.9*  CL 105 115* 111 103  CO2 16* 19* 20* 25  GLUCOSE 222* 126* 109* 127*  BUN 40* 30* 21* 14  CREATININE 1.27* 1.01 0.92 0.91  CALCIUM 9.1 7.9* 7.8* 7.8*  MG  --   --   --  2.3  AST  --  28  --   --   ALT  --  23  --   --   ALKPHOS  --  68  --   --   BILITOT  --  1.2  --   --    ------------------------------------------------------------------------------------------------------------------ No results for input(s): CHOL, HDL, LDLCALC, TRIG, CHOLHDL, LDLDIRECT in the last 72 hours.  Lab Results  Component Value Date   HGBA1C 5.6 12/30/2015   ------------------------------------------------------------------------------------------------------------------ No results for input(s): TSH, T4TOTAL, T3FREE, THYROIDAB in the last 72 hours.  Invalid input(s): FREET3 ------------------------------------------------------------------------------------------------------------------ No results for input(s): VITAMINB12, FOLATE, FERRITIN, TIBC, IRON, RETICCTPCT in the last 72 hours.  Coagulation profile  Recent Labs Lab 12/29/15 2026  INR 1.49    No results for input(s): DDIMER in the last 72 hours.  Cardiac Enzymes  Recent Labs Lab 12/30/15 1200 12/30/15 1510 12/30/15 2205  TROPONINI 0.09* 0.11* 0.09*   ------------------------------------------------------------------------------------------------------------------    Component Value Date/Time   BNP 331.3* 07/11/2015 2146    Time Spent in minutes  Hemet.D  on 01/01/2016 at 9:54 AM  Between 7am to 7pm - Pager - [phone]  After 7pm  go to [email or web address] - password [facility]  [facility] -  Office  [phone]

## 2016-01-01 NOTE — Evaluation (Signed)
Physical Therapy Evaluation Patient Details Name: John Warren MRN: [id number] DOB: [birth year] Today's Date: 01/01/2016   History of Present Illness  Pt adm with GI bleed. Received 3 units of blood. PMH - ETOH abuse, alcoholic cirrhosis, CHF  Clinical Impression  Pt doing well with mobility and no further PT needed.  Ready for dc from PT standpoint.      Follow Up Recommendations No PT follow up    Equipment Recommendations  None recommended by PT    Recommendations for Other Services       Precautions / Restrictions Precautions Precautions: None      Mobility  Bed Mobility Overal bed mobility: Independent                Transfers Overall transfer level: Independent Equipment used: None                Ambulation/Gait Ambulation/Gait assistance: Independent Ambulation Distance (Feet): 800 Feet Assistive device: None Gait Pattern/deviations: WFL(Within Functional Limits)   Gait velocity interpretation: at or above normal speed for age/gender General Gait Details: Steady gait  Stairs Stairs: Yes Stairs assistance: Modified independent (Device/Increase time) Stair Management: One rail Right;Alternating pattern;Forwards Number of Stairs: 4    Wheelchair Mobility    Modified Rankin (Stroke Patients Only)       Balance Overall balance assessment: No apparent balance deficits (not formally assessed)                                           Pertinent Vitals/Pain Pain Assessment: No/denies pain    Home Living Family/patient expects to be discharged to:: Private residence Living Arrangements: Spouse/significant other Available Help at Discharge: Family;Available PRN/intermittently Type of Home: Mobile home Home Access: Stairs to enter Entrance Stairs-Rails: Right Entrance Stairs-Number of Steps: 3-4 Home Layout: One level Home Equipment: None      Prior Function Level of Independence: Independent                Hand Dominance        Extremity/Trunk Assessment   Upper Extremity Assessment: Overall WFL for tasks assessed           Lower Extremity Assessment: Overall WFL for tasks assessed         Communication   Communication: No difficulties  Cognition Arousal/Alertness: Awake/alert Behavior During Therapy: WFL for tasks assessed/performed Overall Cognitive Status: Within Functional Limits for tasks assessed                      General Comments      Exercises        Assessment/Plan    PT Assessment Patent does not need any further PT services  PT Diagnosis Difficulty walking   PT Problem List    PT Treatment Interventions     PT Goals (Current goals can be found in the Care Plan section) Acute Rehab PT Goals PT Goal Formulation: All assessment and education complete, DC therapy    Frequency     Barriers to discharge        Co-evaluation               End of Session   Activity Tolerance: Patient tolerated treatment well Patient left: in bed;with call bell/phone within reach Nurse Communication: Mobility status         Time: 1353-1403 PT Time Calculation (min) (ACUTE ONLY):  10 min   Charges:   PT Evaluation $PT Eval Low Complexity: 1 Procedure     PT G Codes:        Chelsee Hosie 26-Jan-2016, 3:09 PM [facility] PT [phone]

## 2016-01-01 NOTE — Progress Notes (Signed)
Hemoglobin remains stable.  No further BM's   Importance of complete alcohol avoidance reviewed with the patient.  Will sign off; call us prn.  I will contact Dr. Michail Sermon and, if he wants to arrange office follow-up for the patient, he will have our office contact the patient.  Cleotis Nipper, M.D. Pager [phone] If no answer or after 5 PM call [phone]

## 2016-01-02 ENCOUNTER — Ambulatory Visit (HOSPITAL_COMMUNITY): Payer: Medicare Other

## 2016-01-02 ENCOUNTER — Encounter (HOSPITAL_COMMUNITY): Payer: Self-pay | Admitting: Gastroenterology

## 2016-01-02 DIAGNOSIS — I509 Heart failure, unspecified: Secondary | ICD-10-CM

## 2016-01-02 LAB — GLUCOSE, CAPILLARY
GLUCOSE-CAPILLARY: 113 mg/dL — AB (ref 65–99)
GLUCOSE-CAPILLARY: 96 mg/dL (ref 65–99)
Glucose-Capillary: 111 mg/dL — ABNORMAL HIGH (ref 65–99)

## 2016-01-02 LAB — CBC
HCT: 30.2 % — ABNORMAL LOW (ref 39.0–52.0)
Hemoglobin: 10 g/dL — ABNORMAL LOW (ref 13.0–17.0)
MCH: 30.6 pg (ref 26.0–34.0)
MCHC: 33.1 g/dL (ref 30.0–36.0)
MCV: 92.4 fL (ref 78.0–100.0)
PLATELETS: 81 10*3/uL — AB (ref 150–400)
RBC: 3.27 MIL/uL — ABNORMAL LOW (ref 4.22–5.81)
RDW: 15.1 % (ref 11.5–15.5)
WBC: 6.2 10*3/uL (ref 4.0–10.5)

## 2016-01-02 LAB — MAGNESIUM: MAGNESIUM: 2.1 mg/dL (ref 1.7–2.4)

## 2016-01-02 LAB — POTASSIUM: POTASSIUM: 4 mmol/L (ref 3.5–5.1)

## 2016-01-02 MED ORDER — FOLIC ACID 1 MG PO TABS: 1.0000 mg | ORAL_TABLET | Freq: Every day | ORAL | Status: AC

## 2016-01-02 MED ORDER — VITAMIN B-1 100 MG PO TABS
100.0000 mg | ORAL_TABLET | Freq: Every day | ORAL | Status: DC
  Administered 2016-01-02: 100 mg via ORAL
  Filled 2016-01-02: qty 1

## 2016-01-02 MED ORDER — OMEPRAZOLE 40 MG PO CPDR: 40.0000 mg | DELAYED_RELEASE_CAPSULE | Freq: Every evening | ORAL | Status: DC

## 2016-01-02 MED ORDER — SPIRONOLACTONE 50 MG PO TABS: 50.0000 mg | ORAL_TABLET | Freq: Every day | ORAL | Status: DC

## 2016-01-02 MED ORDER — THIAMINE HCL 100 MG PO TABS: 100.0000 mg | ORAL_TABLET | Freq: Every day | ORAL | Status: DC

## 2016-01-02 NOTE — Care Management Important Message (Signed)
Important Message  Patient Details  Name: John Warren MRN: [id number] Date of Birth: Oct 31, 1951   Medicare Important Message Given:  Yes    [name] May 01/02/2016, 2:04 PM

## 2016-01-02 NOTE — Discharge Summary (Signed)
John Warren, is a 65 y.o. male  DOB 09/26/1951  MRN [id number].  Admission date:  12/29/2015  Admitting Physician  Rise Patience, MD  Discharge Date:  01/02/2016   Primary MD  No primary care provider on file.  Recommendations for primary care physician for things to follow:   CBC, BMP in 3-4 days.  Monitor alcohol use/abuse  Outpatient GI & cardiology follow-up   Admission Diagnosis  Shortness of breath [R06.02] Symptomatic anemia [D64.9]   Discharge Diagnosis  Shortness of breath [R06.02] Symptomatic anemia [D64.9]    Principal Problem:   Acute GI bleeding Active Problems:   AVM (arteriovenous malformation) of colon   Portal hypertensive gastropathy   Alcohol abuse   Acute blood loss anemia   Liver cirrhosis ([place])      Past Medical History  Diagnosis Date  . GERD (gastroesophageal reflux disease)   . Hypertension   . Anemia   . COPD (chronic obstructive pulmonary disease) ([place])   . Symptomatic anemia     a. 07/2015 - felt multifactorial from cirrhosis and AVMs.  . Diastolic CHF ([place])   . Cirrhosis of liver ([place])   . Thrombocytopenia ([place])   . Tobacco use   . Alcohol abuse   . Hypokalemia   . Portal hypertensive gastropathy     a. By EGD 07/2015.  . Colon polyps   . Sigmoid diverticulosis   . Internal hemorrhoids   . AVM (arteriovenous malformation) of colon     a. By colonoscopy 07/2015.  Marland Kitchen SVT (supraventricular tachycardia) ([place])     a. 07/2015 in setting of severe hypokalemia.    Past Surgical History  Procedure Laterality Date  . Cataract extraction    . Hernia repair    . Esophagogastroduodenoscopy N/A 07/15/2015    Procedure: ESOPHAGOGASTRODUODENOSCOPY (EGD);  Surgeon: Wilford Corner, MD;  Location: [facility] ENDOSCOPY;  Service: Endoscopy;  Laterality: N/A;  . Colonoscopy N/A  07/15/2015    Procedure: COLONOSCOPY;  Surgeon: Wilford Corner, MD;  Location: [facility] ENDOSCOPY;  Service: Endoscopy;  Laterality: N/A;  . Esophagogastroduodenoscopy N/A 12/30/2015    Procedure: ESOPHAGOGASTRODUODENOSCOPY (EGD);  Surgeon: Ronald Lobo, MD;  Location: [facility] ENDOSCOPY;  Service: Endoscopy;  Laterality: N/A;       HPI  from the history and physical done on the day of admission:    John Warren is a 65 y.o. male with history of cirrhosis of liver, alcohol abuse, diastolic dysfunction presents to the ER because of shortness of breath and chest pain. Patient states over the last 1 week has been noticing black stools and yesterday had at least 4 episodes of frank rectal bleeding. Today patient was getting increasingly short of breath on walking with chest pain. Patient also felt dizzy and had fallen but denies hitting his head or losing consciousness. In the ER patient's hemoglobin was found to be on 6 with stool occult positive and patient tachycardic. Patient has been admitted for a GI bleed with history of cirrhosis of liver. Patient was admitted for GI bleed  in August 2016 when at that time patient had EGD which showed portal gastropathy and colonoscopy showed polyps with sigmoid diverticulosis and AVMs. Patient denies taking any aspirin or blood thinners. Denies taking any NSAIDs. On exam patient also has lower extremity edema which patient states is chronic.      Hospital Course:     1. Acute most likely upper GI bleed. History of portal hypertension with esophageal varices & colonic AVM - currently in stepdown, status post 2 units of packed RBC transfusion on 12/29/2015, although hemoglobin is 7.5 is still having some shortness of breath and chest pressure on exertion intermittently he received another unit on 12/31/2015, now completely symptom free with stable H&H.   He underwent EGD yesterday which did not show any active bleed but did confirm evidence of esophageal and possible  gastric Varices, GI on board, now on by mouth PPI, now on regular diet. Discussed with GI Dr. Cristina Gong on 01/01/2016. Now had a brown bowel movement and tolerating diet, cleared for discharge by GI, will follow with GI outpatient. Request PCP to check CBC and BMP in 3-4 days.   2. Acute blood loss related anemia. As above.   3. History of alcoholic cirrhosis with ongoing alcohol abuse. Counseled to quit alcohol, on CIWA protocol, SBP prophylaxis with Rocephin, GI on board. Blood pressure too low for beta blocker. Baseline blood pressure is low for the patient in low 100s.   4. History of chronic diastolic CHF EF [id number] on recent echogram. Did have edema but no shortness of breath, replaced on Lasix home dose and have increased his Aldactone dose upon discharge, blood pressure too low for beta blocker, follow with primary cardiologist Dr. Wynonia Lawman post discharge.   5. Mild elevation of troponin. Likely demand ischemia from anemia, troponin rise in non-ACS pattern, chest pain-free at rest, EKG nonacute, recent echo noted. No aspirin due to ongoing bleeding, no beta blocker due to low blood pressure. Supportive care at this time. Follow with his primary cardiologist Dr. Wynonia Lawman post discharge.    6. Hypokalemia. Replaced and stable.       Discharge Condition: Stable  Follow UP  Follow-up Information    Follow up with SCHOOLER,VINCENT C., MD. Schedule an appointment as soon as possible for a visit in 1 week.   Specialty:  Gastroenterology   Contact information:   [id number] [place] [address] [phone]       Follow up with PCP. Schedule an appointment as soon as possible for a visit in 3 days.   Why:  cbc, bmp check      Follow up with Ezzard Standing, MD. Schedule an appointment as soon as possible for a visit in 1 week.   Specialty:  Cardiology   Contact information:   [address] [place] [address] [phone]        Consults  obtained - GI  Diet and Activity recommendation: See Discharge Instructions below  Discharge Instructions           Discharge Instructions    Diet - low sodium heart healthy    Complete by:  As directed      Discharge instructions    Complete by:  As directed   Follow with Primary MD  in 7 days   Get CBC, CMP, 2 view Chest X ray checked  by Primary MD next visit.    Activity: As tolerated with Full fall precautions use walker/cane & assistance as  needed   Disposition Home     Diet:   Heart Healthy with feeding assistance and aspiration precautions.  For Heart failure patients - Check your Weight same time everyday, if you gain over 2 pounds, or you develop in leg swelling, experience more shortness of breath or chest pain, call your Primary MD immediately. Follow Cardiac Low Salt Diet and 1.5 lit/day fluid restriction.   On your next visit with your primary care physician please Get Medicines reviewed and adjusted.   Please request your Prim.MD to go over all Hospital Tests and Procedure/Radiological results at the follow up, please get all Hospital records sent to your Prim MD by signing hospital release before you go home.   If you experience worsening of your admission symptoms, develop shortness of breath, life threatening emergency, suicidal or homicidal thoughts you must seek medical attention immediately by calling 911 or calling your MD immediately  if symptoms less severe.  You Must read complete instructions/literature along with all the possible adverse reactions/side effects for all the Medicines you take and that have been prescribed to you. Take any new Medicines after you have completely understood and accpet all the possible adverse reactions/side effects.   Do not drive, operating heavy machinery, perform activities at heights, swimming or participation in water activities or provide baby sitting services if your were admitted for syncope or siezures until you  have seen by Primary MD or a Neurologist and advised to do so again.  Do not drive when taking Pain medications.    Do not take more than prescribed Pain, Sleep and Anxiety Medications  Special Instructions: If you have smoked or chewed Tobacco  in the last 2 yrs please stop smoking, stop any regular Alcohol  and or any Recreational drug use.  Wear Seat belts while driving.   Please note  You were cared for by a hospitalist during your hospital stay. If you have any questions about your discharge medications or the care you received while you were in the hospital after you are discharged, you can call the unit and asked to speak with the hospitalist on call if the hospitalist that took care of you is not available. Once you are discharged, your primary care physician will handle any further medical issues. Please note that NO REFILLS for any discharge medications will be authorized once you are discharged, as it is imperative that you return to your primary care physician (or establish a relationship with a primary care physician if you do not have one) for your aftercare needs so that they can reassess your need for medications and monitor your lab values.     Increase activity slowly    Complete by:  As directed              Discharge Medications       Medication List    STOP taking these medications        ALKA-SELTZER PO     PEPCID PO     potassium chloride SA 20 MEQ tablet  Commonly known as:  K-DUR,KLOR-CON      TAKE these medications        acetaminophen 500 MG tablet  Commonly known as:  TYLENOL  Take 1,000 mg by mouth every 6 (six) hours as needed for mild pain.     ferrous sulfate 325 (65 FE) MG tablet  Take 1 tablet (325 mg total) by mouth 3 (three) times daily with meals.     folic acid 1  MG tablet  Commonly known as:  FOLVITE  Take 1 tablet (1 mg total) by mouth daily.     furosemide 80 MG tablet  Commonly known as:  LASIX  Take 80 mg by mouth daily.      nitroGLYCERIN 0.4 MG SL tablet  Commonly known as:  NITROSTAT  Place 0.4 mg under the tongue every 5 (five) minutes as needed for chest pain.     omeprazole 40 MG capsule  Commonly known as:  PRILOSEC  Take 1 capsule (40 mg total) by mouth every evening.     spironolactone 50 MG tablet  Commonly known as:  ALDACTONE  Take 1 tablet (50 mg total) by mouth daily.     thiamine 100 MG tablet  Take 1 tablet (100 mg total) by mouth daily.        Major procedures and Radiology Reports - PLEASE review detailed and final reports for all details, in brief -   TTE August 2016  Left ventricle: The cavity size was normal. There was moderatefocal basal hypertrophy. Systolic function was normal. Theestimated ejection fraction was in the range of 55% to 60%. Wallmotion was normal; there were no regional wall motionabnormalities. Features are consistent with a pseudonormal leftventricular filling pattern, with concomitant abnormal relaxationand increased filling pressure (grade 2 diastolic dysfunction). Doppler parameters are consistent with high ventricular fillingpressure. - Aortic valve: Moderate thickening and calcification, consistentwith sclerosis. There was mild regurgitation. Valve area (Vmax): 1.71 cm^2. - Mitral valve: Calcified annulus. Mild thickening andcalcification. There was trivial regurgitation.   EGD  ENDOSCOPIC IMPRESSION: 1. No active bleeding or blood in the stomach at the time thisprocedure. 2. Portal gastropathy as previously observed 3. 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 Based on the bright red reported appearance of the patient's stool, and the absence of elevation of his BUN, I feel that his bleeding was most likely a lower tract origin, even though he does have findings in the upper tract which could potentially lead to bleeding.  RECOMMENDATIONS: 1. Continue observation for now 2. Okay to stop octreotide  and start diet 3. If the patient develops recurrent significant rebleeding, consider bleeding scan with possible arteriography if positive. He might be a candidate for embolization if a diverticular source of bleeding was identified.    Bilateral lower extremity venous Doppler has been completed. No evidence of DVT, superficial thrombosis, or Baker's Cyst.   Dg Chest 2 View  12/29/2015  CLINICAL DATA:  Chest pain, shortness of breath for 3 days EXAM: CHEST  2 VIEW COMPARISON:  07/12/2015 FINDINGS: Cardiomediastinal silhouette is stable. Again noted hyperinflation and chronic mild interstitial prominence. Streaky right infrahilar bronchitic changes or early infiltrate. No pulmonary edema. Stable degenerative changes thoracic spine. IMPRESSION: Again noted hyperinflation and chronic mild interstitial prominence. No pulmonary edema. Streaky right infrahilar bronchitic changes or early infiltrate. Electronically Signed   By: Lahoma Crocker M.D.   On: 12/29/2015 19:04    Micro Results      Recent Results (from the past 240 hour(s))  MRSA PCR Screening     Status: None   Collection Time: 12/29/15  9:54 PM  Result Value Ref Range Status   MRSA by PCR NEGATIVE NEGATIVE Final    Comment:        The GeneXpert MRSA Assay (FDA approved for NASAL specimens only), is one component of a comprehensive MRSA colonization surveillance program. It is not intended to diagnose MRSA infection nor to guide or monitor treatment for MRSA  infections.     Today   Subjective    [name] today has no headache,no chest abdominal pain,no new weakness tingling or numbness, feels much better wants to go home today.     Objective   Blood pressure 103/55, pulse 90, temperature 98.4 F (36.9 C), temperature source Oral, resp. rate 16, height 6\' 2"  (1.88 m), weight 88.905 kg (196 lb), SpO2 100 %.   Intake/Output Summary (Last 24 hours) at 01/02/16 1025 Last data filed at 01/02/16 0500  Gross per 24 hour    Intake    240 ml  Output   3150 ml  Net  -2910 ml    Exam Awake Alert, Oriented x 3, No new F.N deficits, Normal affect [place].AT,PERRAL Supple Neck,No JVD, No cervical lymphadenopathy appriciated.  Symmetrical Chest wall movement, Good air movement bilaterally, CTAB RRR,No Gallops,Rubs or new Murmurs, No Parasternal Heave +ve B.Sounds, Abd Soft, Non tender, No organomegaly appriciated, No rebound -guarding or rigidity. No Cyanosis, Clubbing or edema, No new Rash or bruise   Data Review   CBC w Diff:  Lab Results  Component Value Date   WBC 6.2 01/02/2016   HGB 10.0* 01/02/2016   HCT 30.2* 01/02/2016   PLT 81* 01/02/2016   LYMPHOPCT 19 12/30/2015   MONOPCT 13 12/30/2015   EOSPCT 1 12/30/2015   BASOPCT 0 12/30/2015    CMP:  Lab Results  Component Value Date   NA 136 01/01/2016   K 4.0 01/02/2016   CL 103 01/01/2016   CO2 25 01/01/2016   BUN 14 01/01/2016   CREATININE 0.91 01/01/2016   PROT 4.6* 12/30/2015   ALBUMIN 2.3* 12/30/2015   BILITOT 1.2 12/30/2015   ALKPHOS 68 12/30/2015   AST 28 12/30/2015   ALT 23 12/30/2015  .   Total Time in preparing paper work, data evaluation and todays exam - 35 minutes  Thurnell Lose M.D on 01/02/2016 at 10:25 AM  [facility]   Office  [phone]

## 2016-01-02 NOTE — Progress Notes (Signed)
John Warren to be D/C'd to home per MD order.  Discussed with the patient and all questions fully answered.  VSS, Skin clean, dry and intact without evidence of skin break down, no evidence of skin tears noted. IV catheter discontinued intact. Site without signs and symptoms of complications. Dressing and pressure applied.  An After Visit Summary was printed and given to the patient. Patient received prescriptions.  D/c education completed with patient/family including follow up instructions, medication list, d/c activities limitations if indicated, with other d/c instructions as indicated by MD - patient able to verbalize understanding, all questions fully answered.   Patient instructed to return to ED, call 911, or call MD for any changes in condition.   Patient escorted via [place], and D/C home via private auto.  Morley Kos Price 01/02/2016 1:57 PM

## 2016-01-02 NOTE — Progress Notes (Signed)
  Echocardiogram 2D Echocardiogram has been performed.  Jennette Dubin 01/02/2016, 10:53 AM

## 2016-01-02 NOTE — Discharge Instructions (Signed)
Follow with Primary MD  in 7 days   Get CBC, CMP, 2 view Chest X ray checked  by Primary MD next visit.    Activity: As tolerated with Full fall precautions use walker/cane & assistance as needed   Disposition Home     Diet:   Heart Healthy with feeding assistance and aspiration precautions.  For Heart failure patients - Check your Weight same time everyday, if you gain over 2 pounds, or you develop in leg swelling, experience more shortness of breath or chest pain, call your Primary MD immediately. Follow Cardiac Low Salt Diet and 1.5 lit/day fluid restriction.   On your next visit with your primary care physician please Get Medicines reviewed and adjusted.   Please request your Prim.MD to go over all Hospital Tests and Procedure/Radiological results at the follow up, please get all Hospital records sent to your Prim MD by signing hospital release before you go home.   If you experience worsening of your admission symptoms, develop shortness of breath, life threatening emergency, suicidal or homicidal thoughts you must seek medical attention immediately by calling 911 or calling your MD immediately  if symptoms less severe.  You Must read complete instructions/literature along with all the possible adverse reactions/side effects for all the Medicines you take and that have been prescribed to you. Take any new Medicines after you have completely understood and accpet all the possible adverse reactions/side effects.   Do not drive, operating heavy machinery, perform activities at heights, swimming or participation in water activities or provide baby sitting services if your were admitted for syncope or siezures until you have seen by Primary MD or a Neurologist and advised to do so again.  Do not drive when taking Pain medications.    Do not take more than prescribed Pain, Sleep and Anxiety Medications  Special Instructions: If you have smoked or chewed Tobacco  in the last 2 yrs  please stop smoking, stop any regular Alcohol  and or any Recreational drug use.  Wear Seat belts while driving.   Please note  You were cared for by a hospitalist during your hospital stay. If you have any questions about your discharge medications or the care you received while you were in the hospital after you are discharged, you can call the unit and asked to speak with the hospitalist on call if the hospitalist that took care of you is not available. Once you are discharged, your primary care physician will handle any further medical issues. Please note that NO REFILLS for any discharge medications will be authorized once you are discharged, as it is imperative that you return to your primary care physician (or establish a relationship with a primary care physician if you do not have one) for your aftercare needs so that they can reassess your need for medications and monitor your lab values.

## 2016-01-02 NOTE — Care Management Note (Signed)
Case Management Note  Patient Details  Name: John Warren MRN: [id number] Date of Birth: Nov 18, 1951  Subjective/Objective:                 Independent patient from home with wife admitted with GI Bleed. Transferred from [facility] on 1/28. Anticipate DC to home today. Patient sees Dr Arelia Sneddon at [facility]. Patient covered with [facility] Medicare, no follow up PT recommended from eval.   Action/Plan:  No CM needs identified at this time.  Expected Discharge Date:                  Expected Discharge Plan:  Home/Self Care  In-House Referral:     Discharge planning Services  CM Consult  Post Acute Care Choice:    Choice offered to:     DME Arranged:    DME Agency:     HH Arranged:    [place] Agency:     Status of Service:  Completed, signed off  Medicare Important Message Given:    Date Medicare IM Given:    Medicare IM give by:    Date Additional Medicare IM Given:    Additional Medicare Important Message give by:     If discussed at [place] of Stay Meetings, dates discussed:    Additional Comments:  Carles Collet, RN 01/02/2016, 11:12 AM

## 2016-01-04 DIAGNOSIS — K922 Gastrointestinal hemorrhage, unspecified: Secondary | ICD-10-CM

## 2016-01-04 HISTORY — DX: Gastrointestinal hemorrhage, unspecified: K92.2

## 2016-01-20 ENCOUNTER — Inpatient Hospital Stay (HOSPITAL_COMMUNITY)
Admission: EM | Admit: 2016-01-20 | Discharge: 2016-01-26 | DRG: 300 | Disposition: A | Discharge status: Home / Self Care | Payer: Medicare Other | Attending: Internal Medicine | Admitting: Internal Medicine

## 2016-01-20 ENCOUNTER — Encounter (HOSPITAL_COMMUNITY): Payer: Self-pay | Admitting: Family Medicine

## 2016-01-20 ENCOUNTER — Encounter (HOSPITAL_COMMUNITY)
Admission: EM | Disposition: A | Discharge status: Home / Self Care | Payer: Self-pay | Source: Home / Self Care | Attending: Internal Medicine

## 2016-01-20 DIAGNOSIS — D649 Anemia, unspecified: Secondary | ICD-10-CM | POA: Diagnosis not present

## 2016-01-20 DIAGNOSIS — K3189 Other diseases of stomach and duodenum: Secondary | ICD-10-CM | POA: Diagnosis present

## 2016-01-20 DIAGNOSIS — Z23 Encounter for immunization: Secondary | ICD-10-CM

## 2016-01-20 DIAGNOSIS — I5032 Chronic diastolic (congestive) heart failure: Secondary | ICD-10-CM | POA: Diagnosis present

## 2016-01-20 DIAGNOSIS — R000 Tachycardia, unspecified: Secondary | ICD-10-CM | POA: Diagnosis not present

## 2016-01-20 DIAGNOSIS — D500 Iron deficiency anemia secondary to blood loss (chronic): Secondary | ICD-10-CM | POA: Diagnosis not present

## 2016-01-20 DIAGNOSIS — Z880 Allergy status to penicillin: Secondary | ICD-10-CM

## 2016-01-20 DIAGNOSIS — D62 Acute posthemorrhagic anemia: Secondary | ICD-10-CM | POA: Diagnosis present

## 2016-01-20 DIAGNOSIS — J449 Chronic obstructive pulmonary disease, unspecified: Secondary | ICD-10-CM | POA: Diagnosis not present

## 2016-01-20 DIAGNOSIS — Z87891 Personal history of nicotine dependence: Secondary | ICD-10-CM

## 2016-01-20 DIAGNOSIS — Q2733 Arteriovenous malformation of digestive system vessel: Secondary | ICD-10-CM | POA: Diagnosis not present

## 2016-01-20 DIAGNOSIS — Z79899 Other long term (current) drug therapy: Secondary | ICD-10-CM

## 2016-01-20 DIAGNOSIS — K7031 Alcoholic cirrhosis of liver with ascites: Secondary | ICD-10-CM | POA: Diagnosis not present

## 2016-01-20 DIAGNOSIS — K219 Gastro-esophageal reflux disease without esophagitis: Secondary | ICD-10-CM | POA: Diagnosis present

## 2016-01-20 DIAGNOSIS — K766 Portal hypertension: Secondary | ICD-10-CM | POA: Diagnosis present

## 2016-01-20 DIAGNOSIS — K7030 Alcoholic cirrhosis of liver without ascites: Secondary | ICD-10-CM | POA: Diagnosis not present

## 2016-01-20 DIAGNOSIS — I10 Essential (primary) hypertension: Secondary | ICD-10-CM | POA: Diagnosis present

## 2016-01-20 DIAGNOSIS — K922 Gastrointestinal hemorrhage, unspecified: Secondary | ICD-10-CM | POA: Diagnosis present

## 2016-01-20 DIAGNOSIS — R739 Hyperglycemia, unspecified: Secondary | ICD-10-CM | POA: Diagnosis present

## 2016-01-20 DIAGNOSIS — F1020 Alcohol dependence, uncomplicated: Secondary | ICD-10-CM | POA: Diagnosis not present

## 2016-01-20 DIAGNOSIS — K5520 Angiodysplasia of colon without hemorrhage: Secondary | ICD-10-CM

## 2016-01-20 HISTORY — DX: Gastrointestinal hemorrhage, unspecified: K92.2

## 2016-01-20 LAB — CBG MONITORING, ED: Glucose-Capillary: 128 mg/dL — ABNORMAL HIGH (ref 65–99)

## 2016-01-20 LAB — COMPREHENSIVE METABOLIC PANEL
ALBUMIN: 2.8 g/dL — AB (ref 3.5–5.0)
ALT: 22 U/L (ref 17–63)
AST: 31 U/L (ref 15–41)
Alkaline Phosphatase: 81 U/L (ref 38–126)
Anion gap: 12 (ref 5–15)
BUN: 31 mg/dL — AB (ref 6–20)
CHLORIDE: 113 mmol/L — AB (ref 101–111)
CO2: 17 mmol/L — AB (ref 22–32)
CREATININE: 1.16 mg/dL (ref 0.61–1.24)
Calcium: 8.8 mg/dL — ABNORMAL LOW (ref 8.9–10.3)
GFR calc Af Amer: >60 mL/min (ref >60)
GFR calc non Af Amer: >60 mL/min (ref >60)
GLUCOSE: 189 mg/dL — AB (ref 65–99)
Potassium: 4.5 mmol/L (ref 3.5–5.1)
SODIUM: 142 mmol/L (ref 135–145)
Total Bilirubin: 0.8 mg/dL (ref 0.3–1.2)
Total Protein: 5.3 g/dL — ABNORMAL LOW (ref 6.5–8.1)

## 2016-01-20 LAB — PROTIME-INR
INR: 1.45 (ref 0.00–1.49)
PROTHROMBIN TIME: 17.7 s — AB (ref 11.6–15.2)

## 2016-01-20 LAB — CBC
HCT: 22.2 % — ABNORMAL LOW (ref 39.0–52.0)
HEMATOCRIT: 24.6 % — AB (ref 39.0–52.0)
HEMOGLOBIN: 7.9 g/dL — AB (ref 13.0–17.0)
Hemoglobin: 6.9 g/dL — CL (ref 13.0–17.0)
MCH: 29 pg (ref 26.0–34.0)
MCH: 29 pg (ref 26.0–34.0)
MCHC: 31.1 g/dL (ref 30.0–36.0)
MCHC: 32.1 g/dL (ref 30.0–36.0)
MCV: 93.3 fL (ref 78.0–100.0)
MCV: 90.4 fL (ref 78.0–100.0)
PLATELETS: 185 10*3/uL (ref 150–400)
Platelets: 125 10*3/uL — ABNORMAL LOW (ref 150–400)
RBC: 2.38 MIL/uL — ABNORMAL LOW (ref 4.22–5.81)
RBC: 2.72 MIL/uL — ABNORMAL LOW (ref 4.22–5.81)
RDW: 16.3 % — AB (ref 11.5–15.5)
RDW: 16.8 % — AB (ref 11.5–15.5)
WBC: 13.3 10*3/uL — ABNORMAL HIGH (ref 4.0–10.5)
WBC: 9.6 10*3/uL (ref 4.0–10.5)

## 2016-01-20 LAB — AMMONIA: Ammonia: 54 umol/L — ABNORMAL HIGH (ref 9–35)

## 2016-01-20 LAB — PREPARE RBC (CROSSMATCH)

## 2016-01-20 LAB — GLUCOSE, CAPILLARY: Glucose-Capillary: 167 mg/dL — ABNORMAL HIGH (ref 65–99)

## 2016-01-20 LAB — POC OCCULT BLOOD, ED: Fecal Occult Bld: POSITIVE — AB

## 2016-01-20 LAB — POC URINE PREG, ED: Preg Test, Ur: POSITIVE — AB

## 2016-01-20 SURGERY — EGD (ESOPHAGOGASTRODUODENOSCOPY)
Anesthesia: Moderate Sedation

## 2016-01-20 MED ORDER — INSULIN ASPART 100 UNIT/ML ~~LOC~~ SOLN
0.0000 [IU] | Freq: Three times a day (TID) | SUBCUTANEOUS | Status: DC
  Administered 2016-01-20 – 2016-01-21 (×2): 2 [IU] via SUBCUTANEOUS
  Administered 2016-01-22 (×2): 3 [IU] via SUBCUTANEOUS
  Administered 2016-01-22 – 2016-01-24 (×4): 2 [IU] via SUBCUTANEOUS
  Filled 2016-01-20: qty 1

## 2016-01-20 MED ORDER — SODIUM CHLORIDE 0.9 % IV SOLN
80.0000 mg | Freq: Once | INTRAVENOUS | Status: DC
  Filled 2016-01-20: qty 80

## 2016-01-20 MED ORDER — MORPHINE SULFATE (PF) 2 MG/ML IV SOLN: 1.0000 mg | INTRAVENOUS | Status: DC | PRN

## 2016-01-20 MED ORDER — SODIUM CHLORIDE 0.9 % IV SOLN
8.0000 mg/h | INTRAVENOUS | Status: AC
  Administered 2016-01-20 – 2016-01-22 (×4): 8 mg/h via INTRAVENOUS
  Filled 2016-01-20 (×12): qty 80

## 2016-01-20 MED ORDER — ONDANSETRON HCL 4 MG/2ML IJ SOLN: 4.0000 mg | Freq: Four times a day (QID) | INTRAMUSCULAR | Status: DC | PRN

## 2016-01-20 MED ORDER — SODIUM CHLORIDE 0.9% FLUSH
3.0000 mL | Freq: Two times a day (BID) | INTRAVENOUS | Status: DC
  Administered 2016-01-20 – 2016-01-26 (×7): 3 mL via INTRAVENOUS

## 2016-01-20 MED ORDER — ONDANSETRON HCL 4 MG PO TABS: 4.0000 mg | ORAL_TABLET | Freq: Four times a day (QID) | ORAL | Status: DC | PRN

## 2016-01-20 MED ORDER — ALPRAZOLAM 0.5 MG PO TABS
0.5000 mg | ORAL_TABLET | Freq: Once | ORAL | Status: AC
  Administered 2016-01-20: 0.5 mg via ORAL
  Filled 2016-01-20: qty 1

## 2016-01-20 MED ORDER — SODIUM CHLORIDE 0.9 % IV SOLN
10.0000 mL/h | Freq: Once | INTRAVENOUS | Status: AC
  Administered 2016-01-20: 10 mL/h via INTRAVENOUS

## 2016-01-20 MED ORDER — INSULIN ASPART 100 UNIT/ML ~~LOC~~ SOLN
0.0000 [IU] | Freq: Every day | SUBCUTANEOUS | Status: DC
  Administered 2016-01-23: 2 [IU] via SUBCUTANEOUS

## 2016-01-20 NOTE — Progress Notes (Signed)
Patient very restless and anxious. Patient states "i keep getting up,cant sit still" MD notified and orders received.  RN will continue to monitor patient.

## 2016-01-20 NOTE — Progress Notes (Signed)
Pt received from ED. Pt oriented to room and equipment. Pt denies pain at this time. Call light within reach. Will continue to monitor.  Fritz Pickerel, RN

## 2016-01-20 NOTE — H&P (Signed)
[facility] History and Physical  John Warren [id number] DOB: [birth year] DOA: 01/20/2016  Referring physician: Marland Kitchen PA in ED PCP: No primary care provider on file.   Chief Complaint: melena/BRBPR  HPI: John Warren is a 65 y.o. male with a past medical history that includes cirrhosis of the liver, EtOH abuse, diastolic dysfunction grade 2, AVM of the colon, portal hypertension gastropathy, thrombocytopenia, sigmoid diverticulosis presents to the emergency department with the chief complaint rectal bleeding. Initial evaluation in the emergency department reveals acute blood loss anemia, tachycardia.  Information is obtained from the patient who reports he was discharged from the hospital approximately 21 days ago after being hospitalized for GI bleed. He reports doing well until 4 days ago when he noticed his stool turning dark. Over the next 3 days he describes more frequent stools that are loose and getting "darker and darker". This morning he awakened expelled bright red blood per rectum. Associated symptoms include some mild shortness of breath and mild lower extremity edema. He also reports 2 episodes of nausea without vomiting. He denies any chest pain palpitation headache dizziness syncope or near-syncope. He denies any fever chills cough. He denies any EtOH consumption since his discharge. Denies NSAID use but does take 2 Tylenol per day for pain. Nizoral any other anticoagulation  In the emergency department he is afebrile hemodynamically stable with a heart rate around 117 he is not hypoxic. He was provided with IV fluids and is typed for 2 units of packed red blood cells.   Review of Systems:  10 point review of systems complete and all systems are negative except as indicated in the history of present illness  Past Medical History  Diagnosis Date  . GERD (gastroesophageal reflux disease)   . Hypertension   . Anemia   . COPD (chronic obstructive pulmonary disease)  ([place])   . Symptomatic anemia     a. 07/2015 - felt multifactorial from cirrhosis and AVMs.  . Diastolic CHF ([place])   . Cirrhosis of liver ([place])   . Thrombocytopenia ([place])   . Tobacco use   . Alcohol abuse   . Hypokalemia   . Portal hypertensive gastropathy     a. By EGD 07/2015.  . Colon polyps   . Sigmoid diverticulosis   . Internal hemorrhoids   . AVM (arteriovenous malformation) of colon     a. By colonoscopy 07/2015.  Marland Kitchen SVT (supraventricular tachycardia) ([place])     a. 07/2015 in setting of severe hypokalemia.   Past Surgical History  Procedure Laterality Date  . Cataract extraction    . Hernia repair    . Esophagogastroduodenoscopy N/A 07/15/2015    Procedure: ESOPHAGOGASTRODUODENOSCOPY (EGD);  Surgeon: Wilford Corner, MD;  Location: [facility] ENDOSCOPY;  Service: Endoscopy;  Laterality: N/A;  . Colonoscopy N/A 07/15/2015    Procedure: COLONOSCOPY;  Surgeon: Wilford Corner, MD;  Location[facility] ENDOSCOPY;  Service: Endoscopy;  Laterality: N/A;  . Esophagogastroduodenoscopy N/A 12/30/2015    Procedure: ESOPHAGOGASTRODUODENOSCOPY (EGD);  Surgeon: Ronald Lobo, MD;  Location: [facility] ENDOSCOPY;  Service: Endoscopy;  Laterality: N/A;   Social History:  reports that he quit smoking about 7 years ago. His smoking use included Cigarettes. He has a 80 pack-year smoking history. He has never used smokeless tobacco. He reports that he drinks alcohol. He reports that he does not use illicit drugs. He lives at home with his girlfriend of 30 years. He stopped smoking in 2011. He stopped drinking 3 weeks ago. He does not use a cane  or walker most recent fall about a month ago he's fairly independent with ADLs. He is a retired Dealer Allergies  Allergen Reactions  . Penicillins     Rash     Family History  Problem Relation Age of Onset  . Liver cancer Mother   . Diabetes Mellitus II Sister      Prior to Admission medications   Medication Sig Start Date End Date Taking? Authorizing Provider    acetaminophen (TYLENOL) 500 MG tablet Take 1,000 mg by mouth every 6 (six) hours as needed for mild pain.   Yes Historical Provider, MD  ferrous sulfate 325 (65 FE) MG tablet Take 1 tablet (325 mg total) by mouth 3 (three) times daily with meals. 07/17/15  Yes Dayna N Dunn, PA-C  folic acid (FOLVITE) 1 MG tablet Take 1 tablet (1 mg total) by mouth daily. 01/02/16  Yes Thurnell Lose, MD  furosemide (LASIX) 80 MG tablet Take 80 mg by mouth daily.   Yes Historical Provider, MD  nitroGLYCERIN (NITROSTAT) 0.4 MG SL tablet Place 0.4 mg under the tongue every 5 (five) minutes as needed for chest pain.   Yes Historical Provider, MD  omeprazole (PRILOSEC) 40 MG capsule Take 1 capsule (40 mg total) by mouth every evening. 01/02/16  Yes Thurnell Lose, MD  potassium chloride SA (K-DUR,KLOR-CON) 20 MEQ tablet Take 20 mEq by mouth 2 (two) times daily.   Yes Historical Provider, MD  spironolactone (ALDACTONE) 50 MG tablet Take 1 tablet (50 mg total) by mouth daily. 01/02/16  Yes Thurnell Lose, MD  thiamine 100 MG tablet Take 1 tablet (100 mg total) by mouth daily. 01/02/16  Yes Thurnell Lose, MD   Physical Exam: Filed Vitals:   01/20/16 1021  BP: 113/55  Pulse: 117  Temp: 97.7 F (36.5 C)  TempSrc: Oral  Resp: 20  SpO2: 100%    Wt Readings from Last 3 Encounters:  01/02/16 88.905 kg (196 lb)  07/17/15 88.724 kg (195 lb 9.6 oz)    General:  Appears calm and comfortable jaundiced Eyes: PERRL, normal lids, irises & conjunctiva +scleral icterus ENT: grossly normal hearing, lips & tongue his membranes of his mouth are slightly pale but moist . Poor dentition Neck: no LAD, masses or thyromegaly Cardiovascular: Tachycardic no m/r/g. 1+ lower extremity edema on left trace lower extremity edema on right  Respiratory: CTA bilaterally, no w/r/r. Normal respiratory effort breath sounds somewhat distant Abdomen: soft, ntnd positive bowel sounds Skin: Somewhat orange, no rash or  lesions Musculoskeletal: grossly normal tone BUE/BLE Psychiatric: grossly normal mood and affect, speech fluent and appropriate Neurologic: grossly non-focal. Speech slow and deliberate but clear follows commands responds appropriately           Labs on Admission:  Basic Metabolic Panel:  Recent Labs Lab 01/20/16 1030  NA 142  K 4.5  CL 113*  CO2 17*  GLUCOSE 189*  BUN 31*  CREATININE 1.16  CALCIUM 8.8*   Liver Function Tests:  Recent Labs Lab 01/20/16 1030  AST 31  ALT 22  ALKPHOS 81  BILITOT 0.8  PROT 5.3*  ALBUMIN 2.8*   No results for input(s): LIPASE, AMYLASE in the last 168 hours. No results for input(s): AMMONIA in the last 168 hours. CBC:  Recent Labs Lab 01/20/16 1030  WBC 13.3*  HGB 6.9*  HCT 22.2*  MCV 93.3  PLT 185   Cardiac Enzymes: No results for input(s): CKTOTAL, CKMB, CKMBINDEX, TROPONINI in the last 168 hours.  BNP (  last 3 results)  Recent Labs  07/11/15 1700 07/11/15 2146  BNP 327.0* 331.3*    ProBNP (last 3 results) No results for input(s): PROBNP in the last 8760 hours.  CBG: No results for input(s): GLUCAP in the last 168 hours.  Radiological Exams on Admission: No results found.  EKG:  Assessment/Plan Principal Problem:   Acute blood loss anemia Active Problems:   Iron deficiency anemia due to chronic blood loss   COPD (chronic obstructive pulmonary disease) (HCC)   Cirrhosis, alcoholic (HCC)   AVM (arteriovenous malformation) of colon   Portal hypertensive gastropathy   Symptomatic anemia   Acute GI bleeding   GI bleed  #1. Acute blood loss anemia. History of same in the setting of portal hypertension with esophageal varices and colonic AVM. Hemoglobin 6.9 on admission. Chart review indicates hemoglobin was 10.02 weeks ago. Right red blood with dark stool around rectum on exam. He underwent EGD last hospitalization which did not show any active bleed but did confirm evidence of esophageal and possible gastric  varices -Admit to telemetry -Transfuse 2 units of packed red blood cells -Serial CBCs -GI consult  #2. GI most likely upper. Patient does have a history of portal hypertension and esophageal varices and colonic AVM as noted above. Recent EGD as noted above. Note indicates GI opined diverticula bleed with moderate chance of recurrent bleeding. -Nothing by mouth until evaluated by GI -Protonix bolus followed by protonic strip -Defer resumption of diet to GI  #3. Tachycardia. Likely related to above. Denies chest pain palpitations. -Moderate IV fluids -Monitor on telemetry  #4. 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 Home medications include spironolactone and Lasix. Blood pressure on the soft side on admission -Hold home medications for now -GI consult  #5. History of diastolic heart failure. Last EF 55% with a grade 2 diastolic dysfunction. Appears compensated on admission. 2 units of packed red blood cells ordered. Holding home meds for now -Monitor daily weight -Measure intake and output -Lasix IV as indicated  #6. Hyperglycemia. Serum glucose 189 on admission. Denies history of diabetes -Obtain hemoglobin A1c -Use sliding scale insulin for control  #7. EtOH abuse. Denies alcohol for 20 days. -Social work   Code Status: full DVT Prophylaxis: Family Communication: wife at bedside Disposition Plan: home when ready  Time spent: 49 minutes  [facility]

## 2016-01-20 NOTE — ED Notes (Signed)
Pt here with dark stools since Monday and nausea. sts slight abd pain.

## 2016-01-20 NOTE — ED Provider Notes (Signed)
  Face-to-face evaluation   History: Patient complains of dark color stool for several days. He then noticed 2 episodes of bright red bleeding, today.  Physical exam: Alert and cooperative. No respiratory distress. Moves all extremities equally.  Medical screening examination/treatment/procedure(s) were conducted as a shared visit with non-physician practitioner(s) and myself.  I personally evaluated the patient during the encounter  Daleen Bo, MD 01/21/16 [phone]

## 2016-01-20 NOTE — ED Provider Notes (Signed)
CSN: [id number]     Arrival date & time 01/20/16  [id number] History   First MD Initiated Contact with Patient 01/20/16 1100     Chief Complaint  Patient presents with  . Rectal Bleeding     (Consider location/radiation/quality/duration/timing/severity/associated sxs/prior Treatment) HPI John Warren is a 65 y.o. male history of GI bleed, diastolic heart failure, cirrhosis, colonic AVM and recent admission on 1/30 4 GI bleed comes in for evaluation of rectal bleeding. Patient reports since Monday he has had progressively darkening stools, black today. He reports associated worsening shortness of breath since Monday also. Denies any alcohol consumption since his last medical admission. Takes 2 Tylenol per day for pain. Denies any aspirin, NSAID or other anticoagulation. Reports intermittent chills, but no fevers, overt abdominal pain, urinary symptoms, back pain, chest pain or other numbness or weakness. No other modifying factors.  Past Medical History  Diagnosis Date  . GERD (gastroesophageal reflux disease)   . Hypertension   . Anemia   . COPD (chronic obstructive pulmonary disease) ([place])   . Symptomatic anemia     a. 07/2015 - felt multifactorial from cirrhosis and AVMs.  . Diastolic CHF ([place])   . Cirrhosis of liver ([place])   . Thrombocytopenia ([place])   . Tobacco use   . Alcohol abuse   . Hypokalemia   . Portal hypertensive gastropathy     a. By EGD 07/2015.  . Colon polyps   . Sigmoid diverticulosis   . Internal hemorrhoids   . AVM (arteriovenous malformation) of colon     a. By colonoscopy 07/2015.  Marland Kitchen SVT (supraventricular tachycardia) ([place])     a. 07/2015 in setting of severe hypokalemia.   Past Surgical History  Procedure Laterality Date  . Cataract extraction    . Hernia repair    . Esophagogastroduodenoscopy N/A 07/15/2015    Procedure: ESOPHAGOGASTRODUODENOSCOPY (EGD);  Surgeon: Wilford Corner, MD;  Location: [facility] ENDOSCOPY;  Service: Endoscopy;  Laterality: N/A;  . Colonoscopy  N/A 07/15/2015    Procedure: COLONOSCOPY;  Surgeon: Wilford Corner, MD;  Location[facility] ENDOSCOPY;  Service: Endoscopy;  Laterality: N/A;  . Esophagogastroduodenoscopy N/A 12/30/2015    Procedure: ESOPHAGOGASTRODUODENOSCOPY (EGD);  Surgeon: Ronald Lobo, MD;  Location: [facility] ENDOSCOPY;  Service: Endoscopy;  Laterality: N/A;   Family History  Problem Relation Age of Onset  . Liver cancer Mother   . Diabetes Mellitus II Sister    Social History  Substance Use Topics  . Smoking status: Former Smoker -- 2.00 packs/day for 40 years    Types: Cigarettes    Quit date: 08/14/2008  . Smokeless tobacco: Never Used  . Alcohol Use: 0.0 oz/week    0 Standard drinks or equivalent per week     Comment: 1 pint whiskey daily several beer weekly     Review of Systems A 10 point review of systems was completed and was negative except for pertinent positives and negatives as mentioned in the history of present illness     Allergies  Penicillins  Home Medications   Prior to Admission medications   Medication Sig Start Date End Date Taking? Authorizing Provider  acetaminophen (TYLENOL) 500 MG tablet Take 1,000 mg by mouth every 6 (six) hours as needed for mild pain.   Yes Historical Provider, MD  ferrous sulfate 325 (65 FE) MG tablet Take 1 tablet (325 mg total) by mouth 3 (three) times daily with meals. 07/17/15  Yes Dayna N Dunn, PA-C  folic acid (FOLVITE) 1 MG tablet Take 1 tablet (  1 mg total) by mouth daily. 01/02/16  Yes Thurnell Lose, MD  furosemide (LASIX) 80 MG tablet Take 80 mg by mouth daily.   Yes Historical Provider, MD  nitroGLYCERIN (NITROSTAT) 0.4 MG SL tablet Place 0.4 mg under the tongue every 5 (five) minutes as needed for chest pain.   Yes Historical Provider, MD  omeprazole (PRILOSEC) 40 MG capsule Take 1 capsule (40 mg total) by mouth every evening. 01/02/16  Yes Thurnell Lose, MD  potassium chloride SA (K-DUR,KLOR-CON) 20 MEQ tablet Take 20 mEq by mouth 2 (two) times daily.    Yes Historical Provider, MD  spironolactone (ALDACTONE) 50 MG tablet Take 1 tablet (50 mg total) by mouth daily. 01/02/16  Yes Thurnell Lose, MD  thiamine 100 MG tablet Take 1 tablet (100 mg total) by mouth daily. 01/02/16  Yes Thurnell Lose, MD   BP 113/55 mmHg  Pulse 117  Temp(Src) 97.7 F (36.5 C) (Oral)  Resp 20  SpO2 100% Physical Exam  Constitutional: He is oriented to person, place, and time. He appears well-developed and well-nourished.  HENT:  Head: Normocephalic and atraumatic.  Mouth/Throat: Oropharynx is clear and moist.  Eyes: Conjunctivae are normal. Pupils are equal, round, and reactive to light. Right eye exhibits no discharge. Left eye exhibits no discharge. No scleral icterus.  Neck: Neck supple.  Cardiovascular: Regular rhythm and normal heart sounds.   Tachycardic to 110s  Pulmonary/Chest: Effort normal and breath sounds normal. No respiratory distress. He has no wheezes. He has no rales.  Abdominal: Soft. There is no tenderness.  Genitourinary:  Very dark stool with some dark dried blood around rectum.  Musculoskeletal: He exhibits no tenderness.  Neurological: He is alert and oriented to person, place, and time.  Cranial Nerves II-XII grossly intact  Skin: Skin is warm and dry. No rash noted.  Psychiatric: He has a normal mood and affect.  Nursing note and vitals reviewed.   ED Course  Procedures (including critical care time) Labs Review Labs Reviewed  COMPREHENSIVE METABOLIC PANEL - Abnormal; Notable for the following:    Chloride 113 (*)    CO2 17 (*)    Glucose, Bld 189 (*)    BUN 31 (*)    Calcium 8.8 (*)    Total Protein 5.3 (*)    Albumin 2.8 (*)    All other components within normal limits  CBC - Abnormal; Notable for the following:    WBC 13.3 (*)    RBC 2.38 (*)    Hemoglobin 6.9 (*)    HCT 22.2 (*)    RDW 16.3 (*)    All other components within normal limits  POC URINE PREG, ED - Abnormal; Notable for the following:    Preg  Test, Ur POSITIVE (*)    All other components within normal limits  PROTIME-INR  POC OCCULT BLOOD, ED  TYPE AND SCREEN  PREPARE RBC (CROSSMATCH)    Imaging Review No results found. I have personally reviewed and evaluated these images and lab results as part of my medical decision-making.   EKG Interpretation None     Meds given in ED:  Medications  0.9 %  sodium chloride infusion (not administered)    New Prescriptions   No medications on file   Filed Vitals:   01/20/16 1021  BP: 113/55  Pulse: 117  Temp: 97.7 F (36.5 C)  TempSrc: Oral  Resp: 20  SpO2: 100%    CRITICAL CARE Performed by: Verl Dicker  Total critical care time: 30 minutes  Critical care time was exclusive of separately billable procedures and treating other patients.  Critical care was necessary to treat or prevent imminent or life-threatening deterioration.  Critical care was time spent personally by me on the following activities: development of treatment plan with patient and/or surrogate as well as nursing, discussions with consultants, evaluation of patient's response to treatment, examination of patient, obtaining history from patient or surrogate, ordering and performing treatments and interventions, ordering and review of laboratory studies, ordering and review of radiographic studies, pulse oximetry and re-evaluation of patient's condition.  MDM  FAARIS COLLETT is a 65 y.o. male with a history of GI bleed, alcohol abuse, cirrhosis, heart failure and recent admission on 1/30 for GI bleed comes in for evaluation of rectal bleeding. Patient has been short of breath that has been worsening since Monday when he noticed his dark stools. He does have frank blood on rectal exam. He is tachycardic to 117 on arrival. Hemoglobin 6.9 Order 2 units of PRBC in the ED. Plan for medical admission for GI bleed and symptomatic anemia. Discussed with hospitalist, Dyanne Carrel NP. Patient admitted to  telemetry. Discussed with Dr. Amedeo Plenty, gastroenterology, will evaluate in the ED. Final diagnoses:  Gastrointestinal hemorrhage, unspecified gastritis, unspecified gastrointestinal hemorrhage type        Comer Locket, PA-C 01/20/16 1600  Daleen Bo, MD 01/21/16 [phone]

## 2016-01-20 NOTE — Consult Note (Signed)
[facility] Consult Note  Referring Provider: No ref. provider found Primary Care Physician:  No primary care provider on file. Primary Gastroenterologist:  Dr.  Laurel Dimmer Complaint: Black to bloody stools HPI: John Warren is an 65 y.o. white male  presents with dark stools for for 5 days becoming more black to maroon with weakness hemoglobin is found to be 6.9 down from 10 2 weeks ago. He was admitted for GI bleeding of obscure origin a month ago and had an EGD which showed minimal portal gastropathy no other bleeding source. In August 2016 he had an EGD which showed basically the same thing a colonoscopy which showed diverticulosis and 1 polyp was removed. The patient has a chronic history of drinking but states he has not had any alcohol since his last discharge and has not had any nonsteroidal anti-inflammatory drugs.  Past Medical History  Diagnosis Date  . GERD (gastroesophageal reflux disease)   . Hypertension   . Anemia   . COPD (chronic obstructive pulmonary disease) ([place])   . Symptomatic anemia     a. 07/2015 - felt multifactorial from cirrhosis and AVMs.  . Diastolic CHF ([place])   . Cirrhosis of liver ([place])   . Thrombocytopenia ([place])   . Tobacco use   . Alcohol abuse   . Hypokalemia   . Portal hypertensive gastropathy     a. By EGD 07/2015.  . Colon polyps   . Sigmoid diverticulosis   . Internal hemorrhoids   . AVM (arteriovenous malformation) of colon     a. By colonoscopy 07/2015.  Marland Kitchen SVT (supraventricular tachycardia) ([place])     a. 07/2015 in setting of severe hypokalemia.    Past Surgical History  Procedure Laterality Date  . Cataract extraction    . Hernia repair    . Esophagogastroduodenoscopy N/A 07/15/2015    Procedure: ESOPHAGOGASTRODUODENOSCOPY (EGD);  Surgeon: Wilford Corner, MD;  Location: [facility] ENDOSCOPY;  Service: Endoscopy;  Laterality: N/A;  . Colonoscopy N/A 07/15/2015    Procedure: COLONOSCOPY;  Surgeon: Wilford Corner, MD;  Location: [facility]  ENDOSCOPY;  Service: Endoscopy;  Laterality: N/A;  . Esophagogastroduodenoscopy N/A 12/30/2015    Procedure: ESOPHAGOGASTRODUODENOSCOPY (EGD);  Surgeon: Ronald Lobo, MD;  Location: [facility] ENDOSCOPY;  Service: Endoscopy;  Laterality: N/A;     (Not in a hospital admission)  Allergies:  Allergies  Allergen Reactions  . Penicillins     Rash     Family History  Problem Relation Age of Onset  . Liver cancer Mother   . Diabetes Mellitus II Sister     Social History:  reports that he quit smoking about 7 years ago. His smoking use included Cigarettes. He has a 80 pack-year smoking history. He has never used smokeless tobacco. He reports that he drinks alcohol. He reports that he does not use illicit drugs.  Review of Systems: negative except as above   Blood pressure 126/54, pulse 97, temperature 98 F (36.7 C), temperature source Oral, resp. rate 13, SpO2 100 %. Head: Normocephalic, without obvious abnormality, atraumatic Neck: no adenopathy, no carotid bruit, no JVD, supple, symmetrical, trachea midline and thyroid not enlarged, symmetric, no tenderness/mass/nodules Resp: clear to auscultation bilaterally Cardio: regular rate and rhythm, S1, S2 normal, no murmur, click, rub or gallop GI: Soft nondistended with normoactive bowel sounds. No hepatosplenomegaly mass or guarding Extremities: extremities normal, atraumatic, no cyanosis or edema  Results for orders placed or performed during the hospital encounter of 01/20/16 (from the past 48 hour(s))  Comprehensive metabolic panel  Status: Abnormal   Collection Time: 01/20/16 10:30 AM  Result Value Ref Range   Sodium 142 135 - 145 mmol/L   Potassium 4.5 3.5 - 5.1 mmol/L   Chloride 113 (H) 101 - 111 mmol/L   CO2 17 (L) 22 - 32 mmol/L   Glucose, Bld 189 (H) 65 - 99 mg/dL   BUN 31 (H) 6 - 20 mg/dL   Creatinine, Ser 1.16 0.61 - 1.24 mg/dL   Calcium 8.8 (L) 8.9 - 10.3 mg/dL   Total Protein 5.3 (L) 6.5 - 8.1 g/dL   Albumin 2.8 (L) 3.5  - 5.0 g/dL   AST 31 15 - 41 U/L   ALT 22 17 - 63 U/L   Alkaline Phosphatase 81 38 - 126 U/L   Total Bilirubin 0.8 0.3 - 1.2 mg/dL   GFR calc non Af Amer >60 >60 mL/min   GFR calc Af Amer >60 >60 mL/min    Comment: (NOTE) The eGFR has been calculated using the CKD EPI equation. This calculation has not been validated in all clinical situations. eGFR's persistently <60 mL/min signify possible Chronic Kidney Disease.    Anion gap 12 5 - 15  CBC     Status: Abnormal   Collection Time: 01/20/16 10:30 AM  Result Value Ref Range   WBC 13.3 (H) 4.0 - 10.5 K/uL   RBC 2.38 (L) 4.22 - 5.81 MIL/uL   Hemoglobin 6.9 (LL) 13.0 - 17.0 g/dL    Comment: REPEATED TO VERIFY CRITICAL RESULT CALLED TO, READ BACK BY AND VERIFIED WITH: C.ROWE,RN 1129 01/20/16 CLARK,S    HCT 22.2 (L) 39.0 - 52.0 %   MCV 93.3 78.0 - 100.0 fL   MCH 29.0 26.0 - 34.0 pg   MCHC 31.1 30.0 - 36.0 g/dL   RDW 16.3 (H) 11.5 - 15.5 %   Platelets 185 150 - 400 K/uL  Type and screen [place]     Status: None (Preliminary result)   Collection Time: 01/20/16 10:30 AM  Result Value Ref Range   ABO/RH(D) O POS    Antibody Screen NEG    Sample Expiration 01/23/2016    Unit Number [id number]    Blood Component Type RED CELLS,LR    Unit division 00    Status of Unit ISSUED    Transfusion Status OK TO TRANSFUSE    Crossmatch Result Compatible    Unit Number [id number]    Blood Component Type RBC LR PHER1    Unit division 00    Status of Unit ISSUED    Transfusion Status OK TO TRANSFUSE    Crossmatch Result Compatible   Protime-INR     Status: Abnormal   Collection Time: 01/20/16 10:30 AM  Result Value Ref Range   Prothrombin Time 17.7 (H) 11.6 - 15.2 seconds   INR 1.45 0.00 - 1.49  Prepare RBC     Status: None   Collection Time: 01/20/16 10:30 AM  Result Value Ref Range   Order Confirmation ORDER PROCESSED BY BLOOD BANK   POC urine preg, ED (not at [facility] [address])     Status: Abnormal   Collection Time:  01/20/16 11:45 AM  Result Value Ref Range   Preg Test, Ur POSITIVE (A) NEGATIVE    Comment:        THE SENSITIVITY OF THIS METHODOLOGY IS >24 mIU/mL   POC occult blood, ED     Status: Abnormal   Collection Time: 01/20/16 11:47 AM  Result Value Ref Range   Fecal Occult Bld  POSITIVE (A) NEGATIVE   No results found.  Assessment: GI bleed is description ranging from dark to bloody bowel movements with 3 g drop in hemoglobin in the last 2 or 3 weeks. Previous workup as outlined above Plan:  Does not appear to be bleeding actively at the moment so we'll hold off on pulled RBC scan although this would be reasonable if bleeding persists during hospitalization. Capsule endoscopy would also be reasonable. Will allow diet tonight and nothing by mouth after midnight. Dr. Penelope Coop to evaluate tomorrow. Arda Daggs C 01/20/2016, 3:40 PM  Pager [phone] If no answer or after 5 PM call [phone]

## 2016-01-21 LAB — GLUCOSE, CAPILLARY
GLUCOSE-CAPILLARY: 157 mg/dL — AB (ref 65–99)
GLUCOSE-CAPILLARY: 144 mg/dL — AB (ref 65–99)
Glucose-Capillary: 121 mg/dL — ABNORMAL HIGH (ref 65–99)
Glucose-Capillary: 118 mg/dL — ABNORMAL HIGH (ref 65–99)

## 2016-01-21 LAB — CBC
HCT: 22.5 % — ABNORMAL LOW (ref 39.0–52.0)
HEMATOCRIT: 23.4 % — AB (ref 39.0–52.0)
HEMOGLOBIN: 8.1 g/dL — AB (ref 13.0–17.0)
HEMOGLOBIN: 7.4 g/dL — AB (ref 13.0–17.0)
MCH: 31.2 pg (ref 26.0–34.0)
MCH: 29.4 pg (ref 26.0–34.0)
MCHC: 34.6 g/dL (ref 30.0–36.0)
MCHC: 32.9 g/dL (ref 30.0–36.0)
MCV: 89.3 fL (ref 78.0–100.0)
MCV: 90 fL (ref 78.0–100.0)
Platelets: 104 10*3/uL — ABNORMAL LOW (ref 150–400)
Platelets: 98 10*3/uL — ABNORMAL LOW (ref 150–400)
RBC: 2.52 MIL/uL — ABNORMAL LOW (ref 4.22–5.81)
RBC: 2.6 MIL/uL — AB (ref 4.22–5.81)
RDW: 17.2 % — ABNORMAL HIGH (ref 11.5–15.5)
RDW: 17.2 % — AB (ref 11.5–15.5)
WBC: 12 10*3/uL — AB (ref 4.0–10.5)
WBC: 10.3 10*3/uL (ref 4.0–10.5)

## 2016-01-21 LAB — BASIC METABOLIC PANEL
ANION GAP: 11 (ref 5–15)
BUN: 22 mg/dL — ABNORMAL HIGH (ref 6–20)
CALCIUM: 8.5 mg/dL — AB (ref 8.9–10.3)
CO2: 19 mmol/L — ABNORMAL LOW (ref 22–32)
Chloride: 112 mmol/L — ABNORMAL HIGH (ref 101–111)
Creatinine, Ser: 1.13 mg/dL (ref 0.61–1.24)
GLUCOSE: 146 mg/dL — AB (ref 65–99)
Potassium: 3.7 mmol/L (ref 3.5–5.1)
SODIUM: 142 mmol/L (ref 135–145)

## 2016-01-21 LAB — PREPARE RBC (CROSSMATCH)

## 2016-01-21 LAB — HEMOGLOBIN AND HEMATOCRIT, BLOOD
HEMATOCRIT: 26.9 % — AB (ref 39.0–52.0)
Hemoglobin: 8.5 g/dL — ABNORMAL LOW (ref 13.0–17.0)

## 2016-01-21 MED ORDER — ADULT MULTIVITAMIN W/MINERALS CH
1.0000 | ORAL_TABLET | Freq: Every day | ORAL | Status: DC
  Administered 2016-01-21 – 2016-01-26 (×6): 1 via ORAL
  Filled 2016-01-21 (×5): qty 1

## 2016-01-21 MED ORDER — SODIUM CHLORIDE 0.9 % IV SOLN
Freq: Once | INTRAVENOUS | Status: AC
  Administered 2016-01-21: 08:00:00 via INTRAVENOUS

## 2016-01-21 MED ORDER — THIAMINE HCL 100 MG/ML IJ SOLN: 100.0000 mg | Freq: Every day | INTRAMUSCULAR | Status: DC

## 2016-01-21 MED ORDER — LACTULOSE 10 GM/15ML PO SOLN
30.0000 g | Freq: Two times a day (BID) | ORAL | Status: DC
  Administered 2016-01-21 – 2016-01-22 (×2): 30 g via ORAL
  Filled 2016-01-21 (×4): qty 45

## 2016-01-21 MED ORDER — FOLIC ACID 1 MG PO TABS
1.0000 mg | ORAL_TABLET | Freq: Every day | ORAL | Status: DC
  Administered 2016-01-21 – 2016-01-26 (×6): 1 mg via ORAL
  Filled 2016-01-21 (×5): qty 1

## 2016-01-21 MED ORDER — VITAMIN B-1 100 MG PO TABS
100.0000 mg | ORAL_TABLET | Freq: Every day | ORAL | Status: DC
  Administered 2016-01-21 – 2016-01-26 (×6): 100 mg via ORAL
  Filled 2016-01-21 (×5): qty 1

## 2016-01-21 NOTE — Progress Notes (Signed)
[facility] PROGRESS NOTE  SHAROD ZIMMERLY [id number] DOB: 1951-01-06 DOA: 01/20/2016 PCP: No primary care provider on file.  Assessment/Plan: 1. Acute blood loss anemia., GI bleed.  - History of same in the setting of portal hypertension with esophageal varices and colonic AVM. Hemoglobin 6.9 on admission. Chart review indicates hemoglobin was 10.02 weeks ago. Right red blood with dark stool around rectum on exam. He underwent EGD last hospitalization which did not show any active bleed but did confirm evidence of esophageal and possible gastric varices. -Received  2 units of packed red blood cells 2-17.  -Hb still at 7, patient tachycardic. Will transfuse another unit today 2-18.  -Continue with  protonix  Gtt  -if patient develops active bleeding, plan to order Red Blood cell scan.  -repeat Hb tonight.  -INR normal at 1.4  3. Tachycardia. Likely related to above. Denies chest pain palpitations. -one more unit PRBC.   4. 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 Home medications include spironolactone and Lasix. Blood pressure on the soft side on admission -Hold home medications for now -ammonia at 54, will start lactulose.   5. History of diastolic heart failure. Last EF 55% with a grade 2 diastolic dysfunction. -Monitor daily weight -will resume lasix tomorrow.   6. Hyperglycemia. Serum glucose 189 on admission. Denies history of diabetes -hemoglobin A1c pending.  -Use sliding scale insulin for control  7. EtOH abuse. Denies alcohol for 20 days. 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   Code Status: Full Code.  Family Communication: care discussed with patient.  Disposition Plan: Remain inpatient to monitor Hb    Consultants:  GI  Procedures:  none  Antibiotics:  none  HPI/Subjective: Eating lunch, no complaints.  No further BM, no bleeding   Objective: Filed Vitals:   01/20/16 2006 01/21/16 0318  BP: 130/46 135/59  Pulse: 119 115  Temp: 98.2  F (36.8 C) 98.3 F (36.8 C)  Resp: 18 18    Intake/Output Summary (Last 24 hours) at 01/21/16 1136 Last data filed at 01/20/16 2006  Gross per 24 hour  Intake    857 ml  Output      0 ml  Net    857 ml   Filed Weights   01/20/16 1812  Weight: 86.637 kg (191 lb)    Exam:   General:  Alert sitting chair.   Cardiovascular: S 1, S 2 RRR  Respiratory: CTA  Abdomen: Bs present, soft, nt  Musculoskeletal: no edema  Data Reviewed: Basic Metabolic Panel:  Recent Labs Lab 01/20/16 1030 01/21/16 0450  NA 142 142  K 4.5 3.7  CL 113* 112*  CO2 17* 19*  GLUCOSE 189* 146*  BUN 31* 22*  CREATININE 1.16 1.13  CALCIUM 8.8* 8.5*   Liver Function Tests:  Recent Labs Lab 01/20/16 1030  AST 31  ALT 22  ALKPHOS 81  BILITOT 0.8  PROT 5.3*  ALBUMIN 2.8*   No results for input(s): LIPASE, AMYLASE in the last 168 hours.  Recent Labs Lab 01/20/16 1922  AMMONIA 54*   CBC:  Recent Labs Lab 01/20/16 1030 01/20/16 1922 01/21/16 0024 01/21/16 0450  WBC 13.3* 9.6 12.0* 10.3  HGB 6.9* 7.9* 8.1* 7.4*  HCT 22.2* 24.6* 23.4* 22.5*  MCV 93.3 90.4 90.0 89.3  PLT 185 125* 104* 98*   Cardiac Enzymes: No results for input(s): CKTOTAL, CKMB, CKMBINDEX, TROPONINI in the last 168 hours. BNP (last 3 results)  Recent Labs  07/11/15 1700 07/11/15 2146  BNP 327.0*  331.3*    ProBNP (last 3 results) No results for input(s): PROBNP in the last 8760 hours.  CBG:  Recent Labs Lab 01/20/16 1717 01/20/16 2124 01/21/16 0557 01/21/16 1116  GLUCAP 128* 167* 157* 144*    No results found for this or any previous visit (from the past 240 hour(s)).   Studies: No results found.  Scheduled Meds: . sodium chloride   Intravenous Once  . insulin aspart  0-15 Units Subcutaneous TID WC  . insulin aspart  0-5 Units Subcutaneous QHS  . pantoprazole (PROTONIX) IV  80 mg Intravenous Once  . sodium chloride flush  3 mL Intravenous Q12H   Continuous Infusions: .  pantoprozole (PROTONIX) infusion 8 mg/hr (01/20/16 2134)    Principal Problem:   Acute blood loss anemia Active Problems:   Iron deficiency anemia due to chronic blood loss   COPD (chronic obstructive pulmonary disease) (HCC)   Cirrhosis, alcoholic (HCC)   AVM (arteriovenous malformation) of colon   Portal hypertensive gastropathy   Symptomatic anemia   Acute GI bleeding   GI bleed    Time spent: 35 minutes.     Niel Hummer A  [facility] Pager [phone]. If 7PM-7AM, please contact night-coverage at [email or web address], password [facility] 01/21/2016, 11:36 AM  LOS: 1 day

## 2016-01-21 NOTE — Progress Notes (Signed)
[facility] Progress Note  Subjective: Denies any current problems with GI bleeding. He feels better.  Objective: Vital signs in last 24 hours: Temp:  [97.8 F (36.6 C)-98.3 F (36.8 C)] 98.3 F (36.8 C) (02/18 0318) Pulse Rate:  [94-119] 115 (02/18 0318) Resp:  [12-24] 18 (02/18 0318) BP: (105-135)/(43-62) 135/59 mmHg (02/18 0318) SpO2:  [100 %] 100 % (02/18 0318) Weight:  [86.637 kg (191 lb)] 86.637 kg (191 lb) (02/17 1812) Weight change:    PE:  No distress  Heart regular rhythm  Lungs clear  Abdomen is soft and nontender  Lab Results: Results for orders placed or performed during the hospital encounter of 01/20/16 (from the past 24 hour(s))  CBG monitoring, ED     Status: Abnormal   Collection Time: 01/20/16  5:17 PM  Result Value Ref Range   Glucose-Capillary 128 (H) 65 - 99 mg/dL  CBC     Status: Abnormal   Collection Time: 01/20/16  7:22 PM  Result Value Ref Range   WBC 9.6 4.0 - 10.5 K/uL   RBC 2.72 (L) 4.22 - 5.81 MIL/uL   Hemoglobin 7.9 (L) 13.0 - 17.0 g/dL   HCT 24.6 (L) 39.0 - 52.0 %   MCV 90.4 78.0 - 100.0 fL   MCH 29.0 26.0 - 34.0 pg   MCHC 32.1 30.0 - 36.0 g/dL   RDW 16.8 (H) 11.5 - 15.5 %   Platelets 125 (L) 150 - 400 K/uL  Ammonia     Status: Abnormal   Collection Time: 01/20/16  7:22 PM  Result Value Ref Range   Ammonia 54 (H) 9 - 35 umol/L  Glucose, capillary     Status: Abnormal   Collection Time: 01/20/16  9:24 PM  Result Value Ref Range   Glucose-Capillary 167 (H) 65 - 99 mg/dL  CBC     Status: Abnormal   Collection Time: 01/21/16 12:24 AM  Result Value Ref Range   WBC 12.0 (H) 4.0 - 10.5 K/uL   RBC 2.60 (L) 4.22 - 5.81 MIL/uL   Hemoglobin 8.1 (L) 13.0 - 17.0 g/dL   HCT 23.4 (L) 39.0 - 52.0 %   MCV 90.0 78.0 - 100.0 fL   MCH 31.2 26.0 - 34.0 pg   MCHC 34.6 30.0 - 36.0 g/dL   RDW 17.2 (H) 11.5 - 15.5 %   Platelets 104 (L) 150 - 400 K/uL  Basic metabolic panel     Status: Abnormal   Collection Time: 01/21/16  4:50 AM   Result Value Ref Range   Sodium 142 135 - 145 mmol/L   Potassium 3.7 3.5 - 5.1 mmol/L   Chloride 112 (H) 101 - 111 mmol/L   CO2 19 (L) 22 - 32 mmol/L   Glucose, Bld 146 (H) 65 - 99 mg/dL   BUN 22 (H) 6 - 20 mg/dL   Creatinine, Ser 1.13 0.61 - 1.24 mg/dL   Calcium 8.5 (L) 8.9 - 10.3 mg/dL   GFR calc non Af Amer >60 >60 mL/min   GFR calc Af Amer >60 >60 mL/min   Anion gap 11 5 - 15  CBC     Status: Abnormal   Collection Time: 01/21/16  4:50 AM  Result Value Ref Range   WBC 10.3 4.0 - 10.5 K/uL   RBC 2.52 (L) 4.22 - 5.81 MIL/uL   Hemoglobin 7.4 (L) 13.0 - 17.0 g/dL   HCT 22.5 (L) 39.0 - 52.0 %   MCV 89.3 78.0 - 100.0 fL   MCH 29.4 26.0 -  34.0 pg   MCHC 32.9 30.0 - 36.0 g/dL   RDW 17.2 (H) 11.5 - 15.5 %   Platelets 98 (L) 150 - 400 K/uL  Glucose, capillary     Status: Abnormal   Collection Time: 01/21/16  5:57 AM  Result Value Ref Range   Glucose-Capillary 157 (H) 65 - 99 mg/dL  Prepare RBC     Status: None   Collection Time: 01/21/16  7:37 AM  Result Value Ref Range   Order Confirmation ORDER PROCESSED BY BLOOD BANK   Glucose, capillary     Status: Abnormal   Collection Time: 01/21/16 11:16 AM  Result Value Ref Range   Glucose-Capillary 144 (H) 65 - 99 mg/dL    Studies/Results: No results found.    Assessment: Gastrointestinal bleeding most likely due to portal hypertensive gastropathy.  Plan:   Continue medical management. In view of recent endoscopy a couple of weeks ago I do not think we need to do another one at this time. Watch for signs of significant bleeding.    John Warren 01/21/2016, 12:34 PM  Pager: [phone] If no answer or after 5 PM call [phone]

## 2016-01-22 DIAGNOSIS — Q2733 Arteriovenous malformation of digestive system vessel: Principal | ICD-10-CM

## 2016-01-22 LAB — TYPE AND SCREEN
ABO/RH(D): O POS
ANTIBODY SCREEN: NEGATIVE
UNIT DIVISION: 0
Unit division: 0
Unit division: 0

## 2016-01-22 LAB — GLUCOSE, CAPILLARY
GLUCOSE-CAPILLARY: 153 mg/dL — AB (ref 65–99)
GLUCOSE-CAPILLARY: 100 mg/dL — AB (ref 65–99)
Glucose-Capillary: 168 mg/dL — ABNORMAL HIGH (ref 65–99)
Glucose-Capillary: 122 mg/dL — ABNORMAL HIGH (ref 65–99)

## 2016-01-22 LAB — CBC
HCT: 23.8 % — ABNORMAL LOW (ref 39.0–52.0)
HEMOGLOBIN: 7.6 g/dL — AB (ref 13.0–17.0)
MCH: 28.7 pg (ref 26.0–34.0)
MCHC: 31.9 g/dL (ref 30.0–36.0)
MCV: 89.8 fL (ref 78.0–100.0)
PLATELETS: 76 10*3/uL — AB (ref 150–400)
RBC: 2.65 MIL/uL — AB (ref 4.22–5.81)
RDW: 17.2 % — ABNORMAL HIGH (ref 11.5–15.5)
WBC: 4.7 10*3/uL (ref 4.0–10.5)

## 2016-01-22 LAB — BASIC METABOLIC PANEL
ANION GAP: 7 (ref 5–15)
BUN: 15 mg/dL (ref 6–20)
CALCIUM: 8.2 mg/dL — AB (ref 8.9–10.3)
CO2: 18 mmol/L — ABNORMAL LOW (ref 22–32)
Chloride: 115 mmol/L — ABNORMAL HIGH (ref 101–111)
Creatinine, Ser: 1 mg/dL (ref 0.61–1.24)
Glucose, Bld: 117 mg/dL — ABNORMAL HIGH (ref 65–99)
POTASSIUM: 3.6 mmol/L (ref 3.5–5.1)
Sodium: 140 mmol/L (ref 135–145)

## 2016-01-22 LAB — HEMOGLOBIN AND HEMATOCRIT, BLOOD
HCT: 30.2 % — ABNORMAL LOW (ref 39.0–52.0)
Hemoglobin: 9.5 g/dL — ABNORMAL LOW (ref 13.0–17.0)

## 2016-01-22 MED ORDER — FUROSEMIDE 80 MG PO TABS
80.0000 mg | ORAL_TABLET | Freq: Every day | ORAL | Status: DC
  Administered 2016-01-22 – 2016-01-25 (×4): 80 mg via ORAL
  Filled 2016-01-22 (×4): qty 1

## 2016-01-22 MED ORDER — SENNA 8.6 MG PO TABS
1.0000 | ORAL_TABLET | Freq: Every day | ORAL | Status: DC
  Administered 2016-01-22 – 2016-01-23 (×2): 8.6 mg via ORAL
  Filled 2016-01-22 (×2): qty 1

## 2016-01-22 MED ORDER — POTASSIUM CHLORIDE CRYS ER 20 MEQ PO TBCR
20.0000 meq | EXTENDED_RELEASE_TABLET | Freq: Every day | ORAL | Status: DC
  Administered 2016-01-22 – 2016-01-26 (×5): 20 meq via ORAL
  Filled 2016-01-22 (×6): qty 1

## 2016-01-22 NOTE — Progress Notes (Signed)
[facility] Progress Note  Subjective: The patient feels fine and has no complaints. He denies abdominal pain or vomiting. He is eating. There has been a slight drop in his hemoglobin.  Objective: Vital signs in last 24 hours: Temp:  [97.8 F (36.6 C)-98.6 F (37 C)] 97.9 F (36.6 C) (02/19 0505) Pulse Rate:  [65-99] 97 (02/19 0505) Resp:  [17-20] 20 (02/19 0505) BP: (107-115)/(40-49) 112/47 mmHg (02/19 0505) SpO2:  [98 %-100 %] 100 % (02/19 0505) Weight:  [88.5 kg (195 lb 1.7 oz)] 88.5 kg (195 lb 1.7 oz) (02/19 0505) Weight change: 1.863 kg (4 lb 1.7 oz)   PE:  No distress  Heart regular rhythm  Lungs clear  Abdomen soft and nontender  Lab Results: Results for orders placed or performed during the hospital encounter of 01/20/16 (from the past 24 hour(s))  Glucose, capillary     Status: Abnormal   Collection Time: 01/21/16 11:16 AM  Result Value Ref Range   Glucose-Capillary 144 (H) 65 - 99 mg/dL  Glucose, capillary     Status: Abnormal   Collection Time: 01/21/16  4:50 PM  Result Value Ref Range   Glucose-Capillary 121 (H) 65 - 99 mg/dL  Hemoglobin and hematocrit, blood     Status: Abnormal   Collection Time: 01/21/16  7:10 PM  Result Value Ref Range   Hemoglobin 8.5 (L) 13.0 - 17.0 g/dL   HCT 26.9 (L) 39.0 - 52.0 %  Glucose, capillary     Status: Abnormal   Collection Time: 01/21/16  9:53 PM  Result Value Ref Range   Glucose-Capillary 118 (H) 65 - 99 mg/dL   Comment 1 Notify RN    Comment 2 Document in Chart   CBC     Status: Abnormal   Collection Time: 01/22/16  2:58 AM  Result Value Ref Range   WBC 4.7 4.0 - 10.5 K/uL   RBC 2.65 (L) 4.22 - 5.81 MIL/uL   Hemoglobin 7.6 (L) 13.0 - 17.0 g/dL   HCT 23.8 (L) 39.0 - 52.0 %   MCV 89.8 78.0 - 100.0 fL   MCH 28.7 26.0 - 34.0 pg   MCHC 31.9 30.0 - 36.0 g/dL   RDW 17.2 (H) 11.5 - 15.5 %   Platelets 76 (L) 150 - 400 K/uL  Basic metabolic panel     Status: Abnormal   Collection Time: 01/22/16  2:58 AM   Result Value Ref Range   Sodium 140 135 - 145 mmol/L   Potassium 3.6 3.5 - 5.1 mmol/L   Chloride 115 (H) 101 - 111 mmol/L   CO2 18 (L) 22 - 32 mmol/L   Glucose, Bld 117 (H) 65 - 99 mg/dL   BUN 15 6 - 20 mg/dL   Creatinine, Ser 1.00 0.61 - 1.24 mg/dL   Calcium 8.2 (L) 8.9 - 10.3 mg/dL   GFR calc non Af Amer >60 >60 mL/min   GFR calc Af Amer >60 >60 mL/min   Anion gap 7 5 - 15  Glucose, capillary     Status: Abnormal   Collection Time: 01/22/16  5:54 AM  Result Value Ref Range   Glucose-Capillary 153 (H) 65 - 99 mg/dL   Comment 1 Notify RN    Comment 2 Document in Chart     Studies/Results: No results found.    Assessment: GI bleed  History of cirrhosis of the liver secondary to alcoholism.  Portal hypertensive gastropathy    Plan:   Continue observation. Follow clinically. Watch for significant bleeding.  In view of recent endoscopy a couple of weeks ago we are holding off on further endoscopic evaluation at this time.    Cassell Clement 01/22/2016, 9:52 AM  Pager: [phone] If no answer or after 5 PM call [phone] Lab Results  Component Value Date   HGB 7.6* 01/22/2016   HGB 8.5* 01/21/2016   HGB 7.4* 01/21/2016   HCT 23.8* 01/22/2016   HCT 26.9* 01/21/2016   HCT 22.5* 01/21/2016   ALKPHOS 81 01/20/2016   ALKPHOS 68 12/30/2015   ALKPHOS 104 07/11/2015   AST 31 01/20/2016   AST 28 12/30/2015   AST 54* 07/11/2015   ALT 22 01/20/2016   ALT 23 12/30/2015   ALT 27 07/11/2015

## 2016-01-22 NOTE — Progress Notes (Signed)
Utilization Review Completed.John Warren T2/19/2017  

## 2016-01-22 NOTE — Progress Notes (Signed)
[facility] PROGRESS NOTE  John Warren [id number] DOB: [birth year] DOA: 01/20/2016 PCP: No primary care provider on file.  Assessment/Plan: 1. Acute blood loss anemia., GI bleed.  - History of same in the setting of portal hypertension with esophageal varices and colonic AVM. Hemoglobin 6.9 on admission. Chart review indicates hemoglobin was 10.02 weeks ago. Right red blood with dark stool around rectum on exam. He underwent EGD last hospitalization which did not show any active bleed but did confirm evidence of esophageal and possible gastric varices. -Received  2 units of packed red blood cells 2-17.  -One PRBC 2-18.  -Continue with  protonix  Gtt  -INR normal at 1.4 -Hb still at 7.6, no BM, no bloody stool. Give laxative, repeat CBC this afternoon if drop will transfuse another unit PRBC.  3. Tachycardia. Likely related to above. Denies chest pain palpitations. -one more unit PRBC.  Resolved.   4. 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 Home medications include spironolactone and Lasix. Blood pressure on the soft side on admission -resume lasix today, resume spironolactone tomorrow.  -ammonia at 54,  started lactulose.   5. History of diastolic heart failure. Last EF 55% with a grade 2 diastolic dysfunction. -Monitor daily weight -will resume lasix  6. Hyperglycemia. Serum glucose 189 on admission. Denies history of diabetes -hemoglobin A1c pending.  -Use sliding scale insulin for control  7. EtOH abuse. Denies alcohol for 20 days. 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   Code Status: Full Code.  Family Communication: care discussed with patient.  Disposition Plan: Remain inpatient to monitor Hb    Consultants:  GI  Procedures:  none  Antibiotics:  none  HPI/Subjective: Eating lunch, no complaints.  No further BM, no bleeding   Objective: Filed Vitals:   01/21/16 2155 01/22/16 0505  BP: 109/45 112/47  Pulse: 99 97  Temp: 98.6 F (37 C)  97.9 F (36.6 C)  Resp: 18 20    Intake/Output Summary (Last 24 hours) at 01/22/16 0901 Last data filed at 01/22/16 0428  Gross per 24 hour  Intake    870 ml  Output   1075 ml  Net   -205 ml   Filed Weights   01/20/16 1812 01/22/16 0505  Weight: 86.637 kg (191 lb) 88.5 kg (195 lb 1.7 oz)    Exam:   General:  Alert sitting chair.   Cardiovascular: S 1, S 2 RRR  Respiratory: CTA  Abdomen: Bs present, soft, nt  Musculoskeletal: no edema  Data Reviewed: Basic Metabolic Panel:  Recent Labs Lab 01/20/16 1030 01/21/16 0450 01/22/16 0258  NA 142 142 140  K 4.5 3.7 3.6  CL 113* 112* 115*  CO2 17* 19* 18*  GLUCOSE 189* 146* 117*  BUN 31* 22* 15  CREATININE 1.16 1.13 1.00  CALCIUM 8.8* 8.5* 8.2*   Liver Function Tests:  Recent Labs Lab 01/20/16 1030  AST 31  ALT 22  ALKPHOS 81  BILITOT 0.8  PROT 5.3*  ALBUMIN 2.8*   No results for input(s): LIPASE, AMYLASE in the last 168 hours.  Recent Labs Lab 01/20/16 1922  AMMONIA 54*   CBC:  Recent Labs Lab 01/20/16 1030 01/20/16 1922 01/21/16 0024 01/21/16 0450 01/21/16 1910 01/22/16 0258  WBC 13.3* 9.6 12.0* 10.3  --  4.7  HGB 6.9* 7.9* 8.1* 7.4* 8.5* 7.6*  HCT 22.2* 24.6* 23.4* 22.5* 26.9* 23.8*  MCV 93.3 90.4 90.0 89.3  --  89.8  PLT 185 125* 104* 98*  --  76*  Cardiac Enzymes: No results for input(s): CKTOTAL, CKMB, CKMBINDEX, TROPONINI in the last 168 hours. BNP (last 3 results)  Recent Labs  07/11/15 1700 07/11/15 2146  BNP 327.0* 331.3*    ProBNP (last 3 results) No results for input(s): PROBNP in the last 8760 hours.  CBG:  Recent Labs Lab 01/21/16 0557 01/21/16 1116 01/21/16 1650 01/21/16 2153 01/22/16 0554  GLUCAP 157* 144* 121* 118* 153*    No results found for this or any previous visit (from the past 240 hour(s)).   Studies: No results found.  Scheduled Meds: . folic acid  1 mg Oral Daily  . furosemide  80 mg Oral Daily  . insulin aspart  0-15 Units  Subcutaneous TID WC  . insulin aspart  0-5 Units Subcutaneous QHS  . lactulose  30 g Oral BID  . multivitamin with minerals  1 tablet Oral Daily  . pantoprazole (PROTONIX) IV  80 mg Intravenous Once  . senna  1 tablet Oral Daily  . sodium chloride flush  3 mL Intravenous Q12H  . thiamine  100 mg Oral Daily   Or  . thiamine  100 mg Intravenous Daily   Continuous Infusions: . pantoprozole (PROTONIX) infusion 8 mg/hr (01/22/16 [id number])    Principal Problem:   Acute blood loss anemia Active Problems:   Iron deficiency anemia due to chronic blood loss   COPD (chronic obstructive pulmonary disease) (HCC)   Cirrhosis, alcoholic (HCC)   AVM (arteriovenous malformation) of colon   Portal hypertensive gastropathy   Symptomatic anemia   Acute GI bleeding   GI bleed    Time spent: 35 minutes.     Niel Hummer A  [facility] Pager [phone]. If 7PM-7AM, please contact night-coverage at [email or web address], password [facility] 01/22/2016, 9:01 AM  LOS: 2 days

## 2016-01-23 ENCOUNTER — Encounter (HOSPITAL_COMMUNITY): Payer: Self-pay | Admitting: General Practice

## 2016-01-23 LAB — GLUCOSE, CAPILLARY
GLUCOSE-CAPILLARY: 167 mg/dL — AB (ref 65–99)
Glucose-Capillary: 115 mg/dL — ABNORMAL HIGH (ref 65–99)
Glucose-Capillary: 126 mg/dL — ABNORMAL HIGH (ref 65–99)
Glucose-Capillary: 127 mg/dL — ABNORMAL HIGH (ref 65–99)

## 2016-01-23 LAB — CBC
HCT: 24.6 % — ABNORMAL LOW (ref 39.0–52.0)
Hemoglobin: 7.9 g/dL — ABNORMAL LOW (ref 13.0–17.0)
MCH: 28.7 pg (ref 26.0–34.0)
MCHC: 32.1 g/dL (ref 30.0–36.0)
MCV: 89.5 fL (ref 78.0–100.0)
Platelets: 92 10*3/uL — ABNORMAL LOW (ref 150–400)
RBC: 2.75 MIL/uL — ABNORMAL LOW (ref 4.22–5.81)
RDW: 16.5 % — AB (ref 11.5–15.5)
WBC: 5.9 10*3/uL (ref 4.0–10.5)

## 2016-01-23 LAB — HEMOGLOBIN AND HEMATOCRIT, BLOOD
HCT: 25.2 % — ABNORMAL LOW (ref 39.0–52.0)
Hemoglobin: 8 g/dL — ABNORMAL LOW (ref 13.0–17.0)

## 2016-01-23 LAB — HEMOGLOBIN A1C
HEMOGLOBIN A1C: 5.6 % (ref 4.8–5.6)
Mean Plasma Glucose: 114 mg/dL

## 2016-01-23 LAB — BASIC METABOLIC PANEL WITH GFR
Anion gap: 9 (ref 5–15)
BUN: 13 mg/dL (ref 6–20)
CO2: 20 mmol/L — ABNORMAL LOW (ref 22–32)
Calcium: 8.1 mg/dL — ABNORMAL LOW (ref 8.9–10.3)
Chloride: 108 mmol/L (ref 101–111)
Creatinine, Ser: 1.01 mg/dL (ref 0.61–1.24)
GFR calc Af Amer: >60 mL/min (ref >60)
GFR calc non Af Amer: >60 mL/min (ref >60)
Glucose, Bld: 114 mg/dL — ABNORMAL HIGH (ref 65–99)
Potassium: 3.2 mmol/L — ABNORMAL LOW (ref 3.5–5.1)
Sodium: 137 mmol/L (ref 135–145)

## 2016-01-23 LAB — POCT PREGNANCY, URINE: PREG TEST UR: POSITIVE — AB

## 2016-01-23 LAB — OCCULT BLOOD, POC DEVICE: FECAL OCCULT BLD: POSITIVE — AB

## 2016-01-23 MED ORDER — LACTULOSE 10 GM/15ML PO SOLN
20.0000 g | Freq: Every day | ORAL | Status: DC
  Administered 2016-01-24 – 2016-01-26 (×3): 20 g via ORAL
  Filled 2016-01-23 (×4): qty 30

## 2016-01-23 MED ORDER — INFLUENZA VAC SPLIT QUAD 0.5 ML IM SUSY
0.5000 mL | PREFILLED_SYRINGE | INTRAMUSCULAR | Status: AC | PRN
  Administered 2016-01-26: 0.5 mL via INTRAMUSCULAR
  Filled 2016-01-23: qty 0.5

## 2016-01-23 MED ORDER — SODIUM CHLORIDE 0.9 % IV SOLN
INTRAVENOUS | Status: DC
  Administered 2016-01-23: 20 mL/h via INTRAVENOUS

## 2016-01-23 MED ORDER — LACTULOSE 10 GM/15ML PO SOLN: 30.0000 g | Freq: Every day | ORAL | Status: DC

## 2016-01-23 MED ORDER — PANTOPRAZOLE SODIUM 40 MG IV SOLR
40.0000 mg | Freq: Two times a day (BID) | INTRAVENOUS | Status: DC
  Administered 2016-01-23 – 2016-01-26 (×6): 40 mg via INTRAVENOUS
  Filled 2016-01-23 (×6): qty 40

## 2016-01-23 MED ORDER — POTASSIUM CHLORIDE CRYS ER 20 MEQ PO TBCR
40.0000 meq | EXTENDED_RELEASE_TABLET | Freq: Once | ORAL | Status: AC
  Administered 2016-01-23: 40 meq via ORAL

## 2016-01-23 NOTE — Progress Notes (Signed)
[facility] PROGRESS NOTE  DERVON EBERSOLE [id number] DOB: 04/07/1951 DOA: 01/20/2016 PCP: Leonard Downing, MD  Assessment/Plan: 1. Acute blood loss anemia., GI bleed.  - History of same in the setting of portal hypertension with esophageal varices and colonic AVM. Hemoglobin 6.9 on admission. Chart review indicates hemoglobin was 10.02 weeks ago. Right red blood with dark stool around rectum on exam. He underwent EGD last hospitalization which did not show any active bleed but did confirm evidence of esophageal and possible gastric varices. -Received  2 units of packed red blood cells 2-17.  -One PRBC 2-18.  Has received 3 units total of PRBC -Continue with  protonix  Gtt ---change to protonix IV BID>  -INR normal at 1.4 Hb at 7.9 -- repeat tonight. He report black BM today  Plan for endoscopy 2-21  3. Tachycardia. Likely related to above. Denies chest pain palpitations.  4. 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 Home medications include spironolactone and Lasix. Blood pressure on the soft side on admission -resume lasix today, resume spironolactone tomorrow.  -ammonia at 54,  started lactulose.   5. History of diastolic heart failure. Last EF 55% with a grade 2 diastolic dysfunction. -Monitor daily weight -Continue  lasix  6. Hyperglycemia. Serum glucose 189 on admission. Denies history of diabetes -hemoglobin A1c 5.6.  -Use sliding scale insulin for control  7. EtOH abuse. Denies alcohol for 20 days. 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   Code Status: Full Code.  Family Communication: care discussed with patient.  Disposition Plan: Remain inpatient to monitor Hb    Consultants:  GI  Procedures:  none  Antibiotics:  none  HPI/Subjective: Feeling ok, had BM, black stool   Objective: Filed Vitals:   01/23/16 0322 01/23/16 1346  BP: 125/60 120/51  Pulse: 100 105  Temp: 98 F (36.7 C) 98.3 F (36.8 C)  Resp: 16 18    Intake/Output  Summary (Last 24 hours) at 01/23/16 1710 Last data filed at 01/23/16 1453  Gross per 24 hour  Intake 1232.08 ml  Output   3975 ml  Net -2742.92 ml   Filed Weights   01/20/16 1812 01/22/16 0505 01/23/16 0319  Weight: 86.637 kg (191 lb) 88.5 kg (195 lb 1.7 oz) 88.8 kg (195 lb 12.3 oz)    Exam:   General:  Alert sitting chair.   Cardiovascular: S 1, S 2 RRR  Respiratory: CTA  Abdomen: Bs present, soft, nt  Musculoskeletal: no edema  Data Reviewed: Basic Metabolic Panel:  Recent Labs Lab 01/20/16 1030 01/21/16 0450 01/22/16 0258 01/23/16 0400  NA 142 142 140 137  K 4.5 3.7 3.6 3.2*  CL 113* 112* 115* 108  CO2 17* 19* 18* 20*  GLUCOSE 189* 146* 117* 114*  BUN 31* 22* 15 13  CREATININE 1.16 1.13 1.00 1.01  CALCIUM 8.8* 8.5* 8.2* 8.1*   Liver Function Tests:  Recent Labs Lab 01/20/16 1030  AST 31  ALT 22  ALKPHOS 81  BILITOT 0.8  PROT 5.3*  ALBUMIN 2.8*   No results for input(s): LIPASE, AMYLASE in the last 168 hours.  Recent Labs Lab 01/20/16 1922  AMMONIA 54*   CBC:  Recent Labs Lab 01/20/16 1922 01/21/16 0024 01/21/16 0450 01/21/16 1910 01/22/16 0258 01/22/16 1318 01/23/16 0400  WBC 9.6 12.0* 10.3  --  4.7  --  5.9  HGB 7.9* 8.1* 7.4* 8.5* 7.6* 9.5* 7.9*  HCT 24.6* 23.4* 22.5* 26.9* 23.8* 30.2* 24.6*  MCV 90.4 90.0 89.3  --  89.8  --  89.5  PLT 125* 104* 98*  --  76*  --  92*   Cardiac Enzymes: No results for input(s): CKTOTAL, CKMB, CKMBINDEX, TROPONINI in the last 168 hours. BNP (last 3 results)  Recent Labs  07/11/15 1700 07/11/15 2146  BNP 327.0* 331.3*    ProBNP (last 3 results) No results for input(s): PROBNP in the last 8760 hours.  CBG:  Recent Labs Lab 01/22/16 1629 01/22/16 2124 01/23/16 0621 01/23/16 1118 01/23/16 1607  GLUCAP 122* 100* 115* 126* 167*    No results found for this or any previous visit (from the past 240 hour(s)).   Studies: No results found.  Scheduled Meds: . folic acid  1 mg Oral  Daily  . furosemide  80 mg Oral Daily  . insulin aspart  0-15 Units Subcutaneous TID WC  . insulin aspart  0-5 Units Subcutaneous QHS  . [START ON 01/24/2016] lactulose  20 g Oral Daily  . multivitamin with minerals  1 tablet Oral Daily  . pantoprazole (PROTONIX) IV  40 mg Intravenous Q12H  . potassium chloride  20 mEq Oral Daily  . potassium chloride  40 mEq Oral Once  . sodium chloride flush  3 mL Intravenous Q12H  . thiamine  100 mg Oral Daily   Or  . thiamine  100 mg Intravenous Daily   Continuous Infusions:    Principal Problem:   Acute blood loss anemia Active Problems:   Iron deficiency anemia due to chronic blood loss   COPD (chronic obstructive pulmonary disease) (HCC)   Cirrhosis, alcoholic (HCC)   AVM (arteriovenous malformation) of colon   Portal hypertensive gastropathy   Symptomatic anemia   Acute GI bleeding   GI bleed    Time spent: 35 minutes.     Niel Hummer A  [facility] Pager [phone]. If 7PM-7AM, please contact night-coverage at [email or web address], password [facility] 01/23/2016, 5:10 PM  LOS: 3 days

## 2016-01-23 NOTE — Progress Notes (Signed)
[facility] Progress Note  Subjective: Patient says he feels fine. Ate breakfast and no abdominal pain. Says he had a black bowel movement yesterday. H and H have dropped overnight.  Objective: Vital signs in last 24 hours: Temp:  [98 F (36.7 C)-98.1 F (36.7 C)] 98 F (36.7 C) (02/20 0322) Pulse Rate:  [100-107] 100 (02/20 0322) Resp:  [16-18] 16 (02/20 0322) BP: (113-125)/(46-60) 125/60 mmHg (02/20 0322) SpO2:  [100 %] 100 % (02/20 0322) Weight:  [88.8 kg (195 lb 12.3 oz)] 88.8 kg (195 lb 12.3 oz) (02/20 0319) Weight change: 0.3 kg (10.6 oz)   PE: No distress Heart RRR Lungs clear Abdomen non tender   Lab Results: Results for orders placed or performed during the hospital encounter of 01/20/16 (from the past 24 hour(s))  Glucose, capillary     Status: Abnormal   Collection Time: 01/22/16 10:58 AM  Result Value Ref Range   Glucose-Capillary 168 (H) 65 - 99 mg/dL   Comment 1 Notify RN    Comment 2 Document in Chart   Hemoglobin and hematocrit, blood     Status: Abnormal   Collection Time: 01/22/16  1:18 PM  Result Value Ref Range   Hemoglobin 9.5 (L) 13.0 - 17.0 g/dL   HCT 30.2 (L) 39.0 - 52.0 %  Glucose, capillary     Status: Abnormal   Collection Time: 01/22/16  4:29 PM  Result Value Ref Range   Glucose-Capillary 122 (H) 65 - 99 mg/dL  Glucose, capillary     Status: Abnormal   Collection Time: 01/22/16  9:24 PM  Result Value Ref Range   Glucose-Capillary 100 (H) 65 - 99 mg/dL  CBC     Status: Abnormal   Collection Time: 01/23/16  4:00 AM  Result Value Ref Range   WBC 5.9 4.0 - 10.5 K/uL   RBC 2.75 (L) 4.22 - 5.81 MIL/uL   Hemoglobin 7.9 (L) 13.0 - 17.0 g/dL   HCT 24.6 (L) 39.0 - 52.0 %   MCV 89.5 78.0 - 100.0 fL   MCH 28.7 26.0 - 34.0 pg   MCHC 32.1 30.0 - 36.0 g/dL   RDW 16.5 (H) 11.5 - 15.5 %   Platelets 92 (L) 150 - 400 K/uL  Basic metabolic panel     Status: Abnormal   Collection Time: 01/23/16  4:00 AM  Result Value Ref Range   Sodium  137 135 - 145 mmol/L   Potassium 3.2 (L) 3.5 - 5.1 mmol/L   Chloride 108 101 - 111 mmol/L   CO2 20 (L) 22 - 32 mmol/L   Glucose, Bld 114 (H) 65 - 99 mg/dL   BUN 13 6 - 20 mg/dL   Creatinine, Ser 1.01 0.61 - 1.24 mg/dL   Calcium 8.1 (L) 8.9 - 10.3 mg/dL   GFR calc non Af Amer >60 >60 mL/min   GFR calc Af Amer >60 >60 mL/min   Anion gap 9 5 - 15  Glucose, capillary     Status: Abnormal   Collection Time: 01/23/16  6:21 AM  Result Value Ref Range   Glucose-Capillary 115 (H) 65 - 99 mg/dL    Studies/Results: No results found.    Assessment: GI bleed. Melena yesterday with drop in H and H. Cirrhosis Portal gastropathy, per prior EGD  Plan:   Will plan EGD tomorrow. Continue PPI.    Cassell Clement 01/23/2016, 9:11 AM  Pager: [phone] If no answer or after 5 PM call [phone] Lab Results  Component Value Date  HGB 7.9* 01/23/2016   HGB 9.5* 01/22/2016   HGB 7.6* 01/22/2016   HCT 24.6* 01/23/2016   HCT 30.2* 01/22/2016   HCT 23.8* 01/22/2016   ALKPHOS 81 01/20/2016   ALKPHOS 68 12/30/2015   ALKPHOS 104 07/11/2015   AST 31 01/20/2016   AST 28 12/30/2015   AST 54* 07/11/2015   ALT 22 01/20/2016   ALT 23 12/30/2015   ALT 27 07/11/2015

## 2016-01-23 NOTE — Care Management Important Message (Signed)
Important Message  Patient Details  Name: John Warren MRN: [id number] Date of Birth: 1951/03/24   Medicare Important Message Given:  Yes    Loann Quill 01/23/2016, 11:09 AM

## 2016-01-24 ENCOUNTER — Inpatient Hospital Stay (HOSPITAL_COMMUNITY): Payer: Medicare Other | Admitting: Anesthesiology

## 2016-01-24 ENCOUNTER — Encounter (HOSPITAL_COMMUNITY)
Admission: EM | Disposition: A | Discharge status: Home / Self Care | Payer: Self-pay | Source: Home / Self Care | Attending: Internal Medicine

## 2016-01-24 ENCOUNTER — Encounter (HOSPITAL_COMMUNITY): Payer: Self-pay | Admitting: *Deleted

## 2016-01-24 HISTORY — PX: ESOPHAGOGASTRODUODENOSCOPY (EGD) WITH PROPOFOL: SHX5813

## 2016-01-24 HISTORY — PX: HOT HEMOSTASIS: SHX5433

## 2016-01-24 LAB — CBC
HEMATOCRIT: 23.4 % — AB (ref 39.0–52.0)
Hemoglobin: 7.4 g/dL — ABNORMAL LOW (ref 13.0–17.0)
MCH: 28.4 pg (ref 26.0–34.0)
MCHC: 31.6 g/dL (ref 30.0–36.0)
MCV: 89.7 fL (ref 78.0–100.0)
Platelets: 87 10*3/uL — ABNORMAL LOW (ref 150–400)
RBC: 2.61 MIL/uL — ABNORMAL LOW (ref 4.22–5.81)
RDW: 15.5 % (ref 11.5–15.5)
WBC: 4.3 10*3/uL (ref 4.0–10.5)

## 2016-01-24 LAB — BASIC METABOLIC PANEL
Anion gap: 8 (ref 5–15)
BUN: 12 mg/dL (ref 6–20)
CALCIUM: 8.1 mg/dL — AB (ref 8.9–10.3)
CO2: 21 mmol/L — AB (ref 22–32)
CREATININE: 0.94 mg/dL (ref 0.61–1.24)
Chloride: 109 mmol/L (ref 101–111)
GFR calc Af Amer: >60 mL/min (ref >60)
GFR calc non Af Amer: >60 mL/min (ref >60)
GLUCOSE: 108 mg/dL — AB (ref 65–99)
Potassium: 3.4 mmol/L — ABNORMAL LOW (ref 3.5–5.1)
Sodium: 138 mmol/L (ref 135–145)

## 2016-01-24 LAB — GLUCOSE, CAPILLARY
GLUCOSE-CAPILLARY: 140 mg/dL — AB (ref 65–99)
GLUCOSE-CAPILLARY: 100 mg/dL — AB (ref 65–99)
GLUCOSE-CAPILLARY: 93 mg/dL (ref 65–99)
Glucose-Capillary: 125 mg/dL — ABNORMAL HIGH (ref 65–99)

## 2016-01-24 LAB — PREPARE RBC (CROSSMATCH)

## 2016-01-24 SURGERY — ESOPHAGOGASTRODUODENOSCOPY (EGD) WITH PROPOFOL
Anesthesia: Monitor Anesthesia Care

## 2016-01-24 MED ORDER — SODIUM CHLORIDE 0.9 % IV SOLN: Freq: Once | INTRAVENOUS | Status: DC

## 2016-01-24 MED ORDER — ONDANSETRON HCL 4 MG/2ML IJ SOLN
INTRAMUSCULAR | Status: DC | PRN
  Administered 2016-01-24: 4 mg via INTRAVENOUS

## 2016-01-24 MED ORDER — GLYCOPYRROLATE 0.2 MG/ML IJ SOLN
INTRAMUSCULAR | Status: DC | PRN
  Administered 2016-01-24: 0.2 mg via INTRAVENOUS

## 2016-01-24 MED ORDER — BUTAMBEN-TETRACAINE-BENZOCAINE 2-2-14 % EX AERO
INHALATION_SPRAY | CUTANEOUS | Status: DC | PRN
  Administered 2016-01-24: 2 via TOPICAL

## 2016-01-24 MED ORDER — PROPOFOL 500 MG/50ML IV EMUL
INTRAVENOUS | Status: DC | PRN
  Administered 2016-01-24: 50 ug/kg/min via INTRAVENOUS

## 2016-01-24 MED ORDER — POTASSIUM CHLORIDE CRYS ER 20 MEQ PO TBCR
40.0000 meq | EXTENDED_RELEASE_TABLET | Freq: Once | ORAL | Status: AC
  Administered 2016-01-24: 40 meq via ORAL
  Filled 2016-01-24: qty 2

## 2016-01-24 MED ORDER — MIDAZOLAM HCL 5 MG/5ML IJ SOLN
INTRAMUSCULAR | Status: DC | PRN
  Administered 2016-01-24: 2 mg via INTRAVENOUS

## 2016-01-24 MED ORDER — FENTANYL CITRATE (PF) 100 MCG/2ML IJ SOLN
INTRAMUSCULAR | Status: DC | PRN
Start: 2016-01-24 — End: 2016-01-24
  Administered 2016-01-24: 50 ug via INTRAVENOUS

## 2016-01-24 MED ORDER — LIDOCAINE HCL (CARDIAC) 20 MG/ML IV SOLN
INTRAVENOUS | Status: DC | PRN
  Administered 2016-01-24: 100 mg via INTRAVENOUS

## 2016-01-24 MED ORDER — LACTATED RINGERS IV SOLN
INTRAVENOUS | Status: DC
  Administered 2016-01-24: 1000 mL via INTRAVENOUS
  Administered 2016-01-24: 08:00:00 via INTRAVENOUS

## 2016-01-24 NOTE — Progress Notes (Signed)
[facility] PROGRESS NOTE  John Warren [id number] DOB: 05-10-1951 DOA: 01/20/2016 PCP: Leonard Downing, MD  Assessment/Plan: 65 year old with PMH  male with a past medical history that includes cirrhosis of the liver,  diastolic dysfunction grade 2, AVM of the colon, portal hypertension gastropathy, thrombocytopenia, sigmoid diverticulosis presents to the emergency department with the chief complaint rectal bleeding. Initial evaluation in the emergency department reveals acute blood loss anemia, tachycardia.  Patient admitted with melena, has received total 4 units PRBC , counting PRBC from today. He was started on Protonix Gtt, subsequently transition to IV BID Protonix. He underwent endoscopy 2-21 found to have AVM S/P cauterized with argon plasma coagulator.   1. Acute blood loss anemia., GI bleed.  - History of same in the setting of portal hypertension with esophageal varices and colonic AVM. Hemoglobin 6.9 on admission. Chart review indicates hemoglobin was 10.02 weeks ago. Right red blood with dark stool around rectum on exam. He underwent EGD last hospitalization which did not show any active bleed but did confirm evidence of esophageal and possible gastric varices. -Received  2 units of packed red blood cells 2-17 , one unit  PRBC 2-18. One unit PRBC on 2-21.  -Has received 4 units total of PRBC -Continue with  protonix  Gtt ---change to protonix IV BID>  -S/P endoscopy: Duodenal AVM S/P Cauterized with argon Plasma Coagulator.  Repeat hb in am.    3. Tachycardia. Likely related to above. Denies chest pain palpitations.  4. 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 Home medications include spironolactone and Lasix. Blood pressure on the soft side on admission -resume lasix today, resume spironolactone tomorrow.  -ammonia at 54,  started lactulose.   5. History of diastolic heart failure. Last EF 55% with a grade 2 diastolic dysfunction. -Monitor daily  weight -Continue  Lasix, replaced Potasium.   6. Hyperglycemia. Serum glucose 189 on admission. Denies history of diabetes -hemoglobin A1c 5.6.  -Use sliding scale insulin for control  7. EtOH abuse. Denies alcohol for 20 days. 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  -no evidence of withdrawal.   Code Status: Full Code.  Family Communication: care discussed with patient.  Disposition Plan: Remain inpatient to monitor Hb    Consultants:  GI  Procedures:  none  Antibiotics:  none  HPI/Subjective: Getting PRBC, came from endoscopy  No abdominal pain.   Objective: Filed Vitals:   01/24/16 1300 01/24/16 1517  BP: 137/60 113/67  Pulse: 95 83  Temp: 97.6 F (36.4 C) 98.1 F (36.7 C)  Resp: 17 16    Intake/Output Summary (Last 24 hours) at 01/24/16 1531 Last data filed at 01/24/16 1517  Gross per 24 hour  Intake   1185 ml  Output   1927 ml  Net   -742 ml   Filed Weights   01/23/16 0319 01/24/16 0500 01/24/16 0805  Weight: 88.8 kg (195 lb 12.3 oz) 89.948 kg (198 lb 4.8 oz) 89.812 kg (198 lb)    Exam:   General:  Alert sitting chair.   Cardiovascular: S 1, S 2 RRR  Respiratory: CTA  Abdomen: Bs present, soft, nt  Musculoskeletal: no edema  Data Reviewed: Basic Metabolic Panel:  Recent Labs Lab 01/20/16 1030 01/21/16 0450 01/22/16 0258 01/23/16 0400 01/24/16 0318  NA 142 142 140 137 138  K 4.5 3.7 3.6 3.2* 3.4*  CL 113* 112* 115* 108 109  CO2 17* 19* 18* 20* 21*  GLUCOSE 189* 146* 117* 114* 108*  BUN 31*  22* 15 13 12   CREATININE 1.16 1.13 1.00 1.01 0.94  CALCIUM 8.8* 8.5* 8.2* 8.1* 8.1*   Liver Function Tests:  Recent Labs Lab 01/20/16 1030  AST 31  ALT 22  ALKPHOS 81  BILITOT 0.8  PROT 5.3*  ALBUMIN 2.8*   No results for input(s): LIPASE, AMYLASE in the last 168 hours.  Recent Labs Lab 01/20/16 1922  AMMONIA 54*   CBC:  Recent Labs Lab 01/21/16 0024 01/21/16 0450  01/22/16 0258 01/22/16 1318 01/23/16 0400  01/23/16 1914 01/24/16 0318  WBC 12.0* 10.3  --  4.7  --  5.9  --  4.3  HGB 8.1* 7.4*  < > 7.6* 9.5* 7.9* 8.0* 7.4*  HCT 23.4* 22.5*  < > 23.8* 30.2* 24.6* 25.2* 23.4*  MCV 90.0 89.3  --  89.8  --  89.5  --  89.7  PLT 104* 98*  --  76*  --  92*  --  87*  < > = values in this interval not displayed. Cardiac Enzymes: No results for input(s): CKTOTAL, CKMB, CKMBINDEX, TROPONINI in the last 168 hours. BNP (last 3 results)  Recent Labs  07/11/15 1700 07/11/15 2146  BNP 327.0* 331.3*    ProBNP (last 3 results) No results for input(s): PROBNP in the last 8760 hours.  CBG:  Recent Labs Lab 01/23/16 1118 01/23/16 1607 01/23/16 2040 01/24/16 0659 01/24/16 1138  GLUCAP 126* 167* 127* 125* 140*    No results found for this or any previous visit (from the past 240 hour(s)).   Studies: No results found.  Scheduled Meds: . sodium chloride   Intravenous Once  . folic acid  1 mg Oral Daily  . furosemide  80 mg Oral Daily  . insulin aspart  0-15 Units Subcutaneous TID WC  . insulin aspart  0-5 Units Subcutaneous QHS  . lactulose  20 g Oral Daily  . multivitamin with minerals  1 tablet Oral Daily  . pantoprazole (PROTONIX) IV  40 mg Intravenous Q12H  . potassium chloride  20 mEq Oral Daily  . potassium chloride  40 mEq Oral Once  . sodium chloride flush  3 mL Intravenous Q12H  . thiamine  100 mg Oral Daily   Or  . thiamine  100 mg Intravenous Daily   Continuous Infusions: . lactated ringers 1,000 mL (01/24/16 [id number])    Principal Problem:   Acute blood loss anemia Active Problems:   Iron deficiency anemia due to chronic blood loss   COPD (chronic obstructive pulmonary disease) (HCC)   Cirrhosis, alcoholic (HCC)   AVM (arteriovenous malformation) of colon   Portal hypertensive gastropathy   Symptomatic anemia   Acute GI bleeding   GI bleed    Time spent: 35 minutes.     Niel Hummer A  [facility] Pager [phone]. If 7PM-7AM, please contact  night-coverage at [email or web address], password [facility] 01/24/2016, 3:31 PM  LOS: 4 days

## 2016-01-24 NOTE — Anesthesia Postprocedure Evaluation (Signed)
Anesthesia Post Note  Patient: John Warren  Procedure(s) Performed: Procedure(s) (LRB): ESOPHAGOGASTRODUODENOSCOPY (EGD) WITH PROPOFOL (N/A) HOT HEMOSTASIS (ARGON PLASMA COAGULATION/BICAP) (N/A)  Patient location during evaluation: PACU Anesthesia Type: MAC Level of consciousness: awake and alert Pain management: pain level controlled Vital Signs Assessment: post-procedure vital signs reviewed and stable Respiratory status: spontaneous breathing Cardiovascular status: stable Anesthetic complications: no    Last Vitals:  Filed Vitals:   01/24/16 0930 01/24/16 0931  BP:    Pulse: 100 100  Temp:    Resp: 13 13    Last Pain:  Filed Vitals:   01/24/16 0931  PainSc: 0-No pain                 Nolon Nations

## 2016-01-24 NOTE — Transfer of Care (Signed)
Immediate Anesthesia Transfer of Care Note  Patient: John Warren  Procedure(s) Performed: Procedure(s): ESOPHAGOGASTRODUODENOSCOPY (EGD) WITH PROPOFOL (N/A) HOT HEMOSTASIS (ARGON PLASMA COAGULATION/BICAP) (N/A)  Patient Location: PACU and Endoscopy Unit  Anesthesia Type:MAC  Level of Consciousness: awake, alert , oriented and sedated  Airway & Oxygen Therapy: Patient Spontanous Breathing and Patient connected to nasal cannula oxygen  Post-op Assessment: Report given to RN, Post -op Vital signs reviewed and stable and Patient moving all extremities  Post vital signs: Reviewed and stable  Last Vitals:  Filed Vitals:   01/24/16 0912 01/24/16 0915  BP: 103/53 103/53  Pulse: 105 107  Temp:    Resp: 22 15    Complications: No apparent anesthesia complications

## 2016-01-24 NOTE — Op Note (Signed)
[facility] [place], [id number]   ENDOSCOPY PROCEDURE REPORT  PATIENT: John Warren, John Warren  MR#: [id number] BIRTHDATE: January 29, 1951 , 18  yrs. old GENDER: male ENDOSCOPIST: Acquanetta Sit, MD REFERRED BY: PROCEDURE DATE:  02/08/2016 PROCEDURE:  EGD with argon plasma coagulation of a duodenal AVM  ASA CLASS:     2 INDICATIONS:  melena, anemia MEDICATIONS: propofol per anesthesia TOPICAL ANESTHETIC:  DESCRIPTION OF PROCEDURE: After the risks benefits and alternatives of the procedure were thoroughly explained, informed consent was obtained.  The Pentax Gastroscope [id number] endoscope was introduced through the mouth and advanced to the second portion of the duodenum , Without limitations.  The instrument was slowly withdrawn as the mucosa was fully examined. Estimated blood loss is zero unless otherwise noted in this procedure report.  Findings:  esophagus: looked normal except in the distal portion there was one possible small varix which flattened with insufflation. There were no signs of any bleeding.  Stomach: Normal with no significant signs of portal gastropathy on this exam and no ulcers or gastric varices.  Duodenum: In the duodenal bulb there was a focal AVM is seen in image 7 and 8. This had small frond-like capillary projections coming out from the center. There was no evidence of active bleeding but this certainly could be a source of melena and GI bleeding. The argon plasma coagulator was used to cauterize this area see image 9.    The scope was then withdrawn from the patient and the procedure completed.  COMPLICATIONS: There were no immediate complications.  ENDOSCOPIC IMPRESSION:duodenal AVM cauterized with the argon plasma coagulator   RECOMMENDATIONS:PPI therapy. Watch for signs of further bleeding.   REPEAT EXAM:  eSignedAcquanetta Sit, MD 02-08-2016 9:14 AM    CC:  CPT CODES: ICD CODES:  The ICD and CPT  codes recommended by this software are interpretations from the data that the clinical staff has captured with the software.  The verification of the translation of this report to the ICD and CPT codes and modifiers is the sole responsibility of the health care institution and practicing physician where this report was generated.  [employer] will not be held responsible for the validity of the ICD and CPT codes included on this report.  AMA assumes no liability for data contained or not contained herein. CPT is a Designer, television/film set of the [employer].  PATIENT NAME:  John Warren, John Warren MR#: [id number]

## 2016-01-24 NOTE — Anesthesia Preprocedure Evaluation (Signed)
Anesthesia Evaluation  Patient identified by MRN, date of birth, ID band Patient awake    Reviewed: Allergy & Precautions, NPO status , Patient's Chart, lab work & pertinent test results, reviewed documented beta blocker date and time   Airway Mallampati: III  TM Distance: >3 FB Neck ROM: Full    Dental  (+) Teeth Intact   Pulmonary shortness of breath, COPD, former smoker,     + decreased breath sounds      Cardiovascular Exercise Tolerance: Poor hypertension, Pt. on medications +CHF   Rhythm:Regular Rate:Normal     Neuro/Psych negative neurological ROS  negative psych ROS   GI/Hepatic GERD  Medicated,(+) Cirrhosis       ,   Endo/Other  negative endocrine ROS  Renal/GU negative Renal ROS     Musculoskeletal negative musculoskeletal ROS (+)   Abdominal   Peds  Hematology negative hematology ROS (+) anemia ,   Anesthesia Other Findings   Reproductive/Obstetrics                             Lab Results  Component Value Date   WBC 4.3 01/24/2016   HGB 7.4* 01/24/2016   HCT 23.4* 01/24/2016   MCV 89.7 01/24/2016   PLT 87* 01/24/2016   Lab Results  Component Value Date   CREATININE 0.94 01/24/2016   BUN 12 01/24/2016   NA 138 01/24/2016   K 3.4* 01/24/2016   CL 109 01/24/2016   CO2 21* 01/24/2016   Lab Results  Component Value Date   INR 1.45 01/20/2016   INR 1.49 12/29/2015    Anesthesia Physical  Anesthesia Plan  ASA: II  Anesthesia Plan: MAC   Post-op Pain Management:    Induction: Intravenous  Airway Management Planned: Natural Airway  Additional Equipment:   Intra-op Plan:   Post-operative Plan:   Informed Consent: I have reviewed the patients History and Physical, chart, labs and discussed the procedure including the risks, benefits and alternatives for the proposed anesthesia with the patient or authorized representative who has indicated his/her  understanding and acceptance.   Dental advisory given  Plan Discussed with: CRNA  Anesthesia Plan Comments:         Anesthesia Quick Evaluation

## 2016-01-25 ENCOUNTER — Encounter (HOSPITAL_COMMUNITY): Payer: Self-pay | Admitting: Gastroenterology

## 2016-01-25 DIAGNOSIS — D500 Iron deficiency anemia secondary to blood loss (chronic): Secondary | ICD-10-CM

## 2016-01-25 LAB — CBC
HCT: 26.8 % — ABNORMAL LOW (ref 39.0–52.0)
Hemoglobin: 8.4 g/dL — ABNORMAL LOW (ref 13.0–17.0)
MCH: 28.4 pg (ref 26.0–34.0)
MCHC: 31.3 g/dL (ref 30.0–36.0)
MCV: 90.5 fL (ref 78.0–100.0)
PLATELETS: 95 10*3/uL — AB (ref 150–400)
RBC: 2.96 MIL/uL — AB (ref 4.22–5.81)
RDW: 15.1 % (ref 11.5–15.5)
WBC: 4.1 10*3/uL (ref 4.0–10.5)

## 2016-01-25 LAB — BASIC METABOLIC PANEL
ANION GAP: 5 (ref 5–15)
BUN: 7 mg/dL (ref 6–20)
CO2: 22 mmol/L (ref 22–32)
Calcium: 8.3 mg/dL — ABNORMAL LOW (ref 8.9–10.3)
Chloride: 110 mmol/L (ref 101–111)
Creatinine, Ser: 0.99 mg/dL (ref 0.61–1.24)
GFR calc Af Amer: >60 mL/min (ref >60)
Glucose, Bld: 102 mg/dL — ABNORMAL HIGH (ref 65–99)
POTASSIUM: 4.6 mmol/L (ref 3.5–5.1)
SODIUM: 137 mmol/L (ref 135–145)

## 2016-01-25 LAB — TYPE AND SCREEN
ABO/RH(D): O POS
ANTIBODY SCREEN: NEGATIVE
UNIT DIVISION: 0

## 2016-01-25 LAB — GLUCOSE, CAPILLARY
GLUCOSE-CAPILLARY: 97 mg/dL (ref 65–99)
GLUCOSE-CAPILLARY: 114 mg/dL — AB (ref 65–99)
Glucose-Capillary: 88 mg/dL (ref 65–99)
Glucose-Capillary: 170 mg/dL — ABNORMAL HIGH (ref 65–99)

## 2016-01-25 MED ORDER — SPIRONOLACTONE 25 MG PO TABS
50.0000 mg | ORAL_TABLET | Freq: Every day | ORAL | Status: DC
  Administered 2016-01-26: 50 mg via ORAL
  Filled 2016-01-25: qty 2

## 2016-01-25 NOTE — Progress Notes (Signed)
[facility] Progress Note  Subjective: No complaints. Wants regular food.  Objective: Vital signs in last 24 hours: Temp:  [97.6 F (36.4 C)-98.7 F (37.1 C)] 97.9 F (36.6 C) (02/22 0430) Pulse Rate:  [83-95] 84 (02/22 0430) Resp:  [16-18] 18 (02/22 0430) BP: (100-137)/(53-67) 100/53 mmHg (02/22 0430) SpO2:  [97 %-100 %] 98 % (02/22 0430) Weight:  [91.037 kg (200 lb 11.2 oz)] 91.037 kg (200 lb 11.2 oz) (02/22 0435) Weight change: -0.136 kg (-4.8 oz)   PE: No distress Heart RRR Lungs clear Abdomen non tender.   Lab Results: Results for orders placed or performed during the hospital encounter of 01/20/16 (from the past 24 hour(s))  Prepare RBC     Status: None   Collection Time: 01/24/16  9:58 AM  Result Value Ref Range   Order Confirmation ORDER PROCESSED BY BLOOD BANK   Type and screen [place]     Status: None (Preliminary result)   Collection Time: 01/24/16  9:58 AM  Result Value Ref Range   ABO/RH(D) O POS    Antibody Screen NEG    Sample Expiration 01/27/2016    Unit Number [id number]    Blood Component Type RBC LR PHER2    Unit division 00    Status of Unit ISSUED    Transfusion Status OK TO TRANSFUSE    Crossmatch Result Compatible   Glucose, capillary     Status: Abnormal   Collection Time: 01/24/16 11:38 AM  Result Value Ref Range   Glucose-Capillary 140 (H) 65 - 99 mg/dL   Comment 1 Notify RN    Comment 2 Document in Chart   Glucose, capillary     Status: Abnormal   Collection Time: 01/24/16  4:08 PM  Result Value Ref Range   Glucose-Capillary 100 (H) 65 - 99 mg/dL   Comment 1 Notify RN    Comment 2 Document in Chart   Glucose, capillary     Status: None   Collection Time: 01/24/16  8:43 PM  Result Value Ref Range   Glucose-Capillary 93 65 - 99 mg/dL   Comment 1 Notify RN    Comment 2 Document in Chart   CBC     Status: Abnormal   Collection Time: 01/25/16  4:00 AM  Result Value Ref Range   WBC 4.1 4.0 - 10.5  K/uL   RBC 2.96 (L) 4.22 - 5.81 MIL/uL   Hemoglobin 8.4 (L) 13.0 - 17.0 g/dL   HCT 26.8 (L) 39.0 - 52.0 %   MCV 90.5 78.0 - 100.0 fL   MCH 28.4 26.0 - 34.0 pg   MCHC 31.3 30.0 - 36.0 g/dL   RDW 15.1 11.5 - 15.5 %   Platelets 95 (L) 150 - 400 K/uL  Basic metabolic panel     Status: Abnormal   Collection Time: 01/25/16  4:00 AM  Result Value Ref Range   Sodium 137 135 - 145 mmol/L   Potassium 4.6 3.5 - 5.1 mmol/L   Chloride 110 101 - 111 mmol/L   CO2 22 22 - 32 mmol/L   Glucose, Bld 102 (H) 65 - 99 mg/dL   BUN 7 6 - 20 mg/dL   Creatinine, Ser 0.99 0.61 - 1.24 mg/dL   Calcium 8.3 (L) 8.9 - 10.3 mg/dL   GFR calc non Af Amer >60 >60 mL/min   GFR calc Af Amer >60 >60 mL/min   Anion gap 5 5 - 15  Glucose, capillary     Status: None  Collection Time: 01/25/16  6:18 AM  Result Value Ref Range   Glucose-Capillary 97 65 - 99 mg/dL    Studies/Results: No results found.    Assessment: Stable post cautery of duodenal AVM.  Plan:   Regular diet. Home soon hopefully.    Cassell Clement 01/25/2016, 9:31 AM  Pager: [phone] If no answer or after 5 PM call [phone] Lab Results  Component Value Date   HGB 8.4* 01/25/2016   HGB 7.4* 01/24/2016   HGB 8.0* 01/23/2016   HCT 26.8* 01/25/2016   HCT 23.4* 01/24/2016   HCT 25.2* 01/23/2016   ALKPHOS 81 01/20/2016   ALKPHOS 68 12/30/2015   ALKPHOS 104 07/11/2015   AST 31 01/20/2016   AST 28 12/30/2015   AST 54* 07/11/2015   ALT 22 01/20/2016   ALT 23 12/30/2015   ALT 27 07/11/2015

## 2016-01-25 NOTE — Progress Notes (Signed)
[facility] PROGRESS NOTE  John Warren [id number] DOB: 1951-10-25 DOA: 01/20/2016 PCP: Leonard Downing, MD  Assessment/Plan: 65 year old with PMH  male with a past medical history that includes cirrhosis of the liver,  diastolic dysfunction grade 2, AVM of the colon, portal hypertension gastropathy, thrombocytopenia, sigmoid diverticulosis presents to the emergency department with the chief complaint rectal bleeding. Initial evaluation in the emergency department reveals acute blood loss anemia, tachycardia.  Patient admitted with melena, has received total 4 units PRBC , counting PRBC from today. He was started on Protonix Gtt, subsequently transition to IV BID Protonix. He underwent endoscopy 2-21 found to have AVM S/P cauterized with argon plasma coagulator.   1. Acute blood loss anemia., GI bleed.  - History of same in the setting of portal hypertension with esophageal varices and colonic AVM. Hemoglobin 6.9 on admission. Chart review indicates hemoglobin was 10.02 weeks ago. Right red blood with dark stool around rectum on exam. He underwent EGD last hospitalization which did not show any active bleed but did confirm evidence of esophageal and possible gastric varices. -Received  2 units of packed red blood cells 2-17 , one unit  PRBC 2-18. One unit PRBC on 2-21.  -Has received 4 units total of PRBC -Continue with  protonix  Gtt ---change to protonix IV BID>  -S/P endoscopy: Duodenal AVM S/P Cauterized with argon Plasma Coagulator.  Repeat hb in am.    3. Tachycardia. Likely related to above. Denies chest pain palpitations. Resolved, sinus rhythm on tele now.  4. 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 Home medications include spironolactone and Lasix. Blood pressure on the soft side on admission -resume lasix on 2/21, resume spironolactone 2/23 -ammonia at 54,  started lactulose. No confusion today  5. History of diastolic heart failure. Last EF 55% with a grade  2 diastolic dysfunction. -Monitor daily weight -Continue  Lasix, replaced Potasium.   6. Hyperglycemia. Serum glucose 189 on admission. Denies history of diabetes -hemoglobin A1c 5.6.  -Use sliding scale insulin for control  7. EtOH abuse. Denies alcohol for 20 days. 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  -no evidence of withdrawal.   Code Status: Full Code.  Family Communication: care discussed with patient and wife in room Disposition Plan: likely home on 2/23 if no more bleed, hgb stable   Consultants:  [facility]  Procedures:  EGD on 2/21  Antibiotics:  none  HPI/Subjective:  No abdominal pain. Reports stool color brown. Eating,. Wife in room.  Objective: Filed Vitals:   01/25/16 0430 01/25/16 1354  BP: 100/53 127/55  Pulse: 84 91  Temp: 97.9 F (36.6 C) 98.1 F (36.7 C)  Resp: 18 18    Intake/Output Summary (Last 24 hours) at 01/25/16 1642 Last data filed at 01/25/16 1300  Gross per 24 hour  Intake    480 ml  Output    550 ml  Net    -70 ml   Filed Weights   01/24/16 0500 01/24/16 0805 01/25/16 0435  Weight: 89.948 kg (198 lb 4.8 oz) 89.812 kg (198 lb) 91.037 kg (200 lb 11.2 oz)    Exam:   General:  Alert sitting chair.   Cardiovascular: S 1, S 2 RRR  Respiratory: CTA  Abdomen: Bs present, soft, nt  Musculoskeletal: chronic left lower extremity pitting edema ( venous US on 1/27, no DVT, wearing compression stocking at home)  Data Reviewed: Basic Metabolic Panel:  Recent Labs Lab 01/21/16 0450 01/22/16 0258 01/23/16 0400 01/24/16 0318 01/25/16  0400  NA 142 140 137 138 137  K 3.7 3.6 3.2* 3.4* 4.6  CL 112* 115* 108 109 110  CO2 19* 18* 20* 21* 22  GLUCOSE 146* 117* 114* 108* 102*  BUN 22* 15 13 12 7   CREATININE 1.13 1.00 1.01 0.94 0.99  CALCIUM 8.5* 8.2* 8.1* 8.1* 8.3*   Liver Function Tests:  Recent Labs Lab 01/20/16 1030  AST 31  ALT 22  ALKPHOS 81  BILITOT 0.8  PROT 5.3*  ALBUMIN 2.8*   No results for input(s):  LIPASE, AMYLASE in the last 168 hours.  Recent Labs Lab 01/20/16 1922  AMMONIA 54*   CBC:  Recent Labs Lab 01/21/16 0450  01/22/16 0258 01/22/16 1318 01/23/16 0400 01/23/16 1914 01/24/16 0318 01/25/16 0400  WBC 10.3  --  4.7  --  5.9  --  4.3 4.1  HGB 7.4*  < > 7.6* 9.5* 7.9* 8.0* 7.4* 8.4*  HCT 22.5*  < > 23.8* 30.2* 24.6* 25.2* 23.4* 26.8*  MCV 89.3  --  89.8  --  89.5  --  89.7 90.5  PLT 98*  --  76*  --  92*  --  87* 95*  < > = values in this interval not displayed. Cardiac Enzymes: No results for input(s): CKTOTAL, CKMB, CKMBINDEX, TROPONINI in the last 168 hours. BNP (last 3 results)  Recent Labs  07/11/15 1700 07/11/15 2146  BNP 327.0* 331.3*    ProBNP (last 3 results) No results for input(s): PROBNP in the last 8760 hours.  CBG:  Recent Labs Lab 01/24/16 1608 01/24/16 2043 01/25/16 0618 01/25/16 1131 01/25/16 1626  GLUCAP 100* 93 97 88 114*    No results found for this or any previous visit (from the past 240 hour(s)).   Studies: No results found.  Scheduled Meds: . sodium chloride   Intravenous Once  . folic acid  1 mg Oral Daily  . furosemide  80 mg Oral Daily  . insulin aspart  0-15 Units Subcutaneous TID WC  . insulin aspart  0-5 Units Subcutaneous QHS  . lactulose  20 g Oral Daily  . multivitamin with minerals  1 tablet Oral Daily  . pantoprazole (PROTONIX) IV  40 mg Intravenous Q12H  . potassium chloride  20 mEq Oral Daily  . sodium chloride flush  3 mL Intravenous Q12H  . thiamine  100 mg Oral Daily   Or  . thiamine  100 mg Intravenous Daily   Continuous Infusions: . lactated ringers 1,000 mL (01/24/16 [id number])    Principal Problem:   Acute blood loss anemia Active Problems:   Iron deficiency anemia due to chronic blood loss   COPD (chronic obstructive pulmonary disease) (HCC)   Cirrhosis, alcoholic (HCC)   AVM (arteriovenous malformation) of colon   Portal hypertensive gastropathy   Symptomatic anemia   Acute GI  bleeding   GI bleed    Time spent: 25 minutes.     Riata Ikeda MD PhD  [facility] Pager [phone] If 7PM-7AM, please contact night-coverage at [email or web address], password [employer] [facility] [employer] 01/25/2016, 4:42 PM  LOS: 5 days

## 2016-01-26 DIAGNOSIS — Q2733 Arteriovenous malformation of digestive system vessel: Secondary | ICD-10-CM | POA: Diagnosis not present

## 2016-01-26 LAB — GLUCOSE, CAPILLARY
GLUCOSE-CAPILLARY: 94 mg/dL (ref 65–99)
Glucose-Capillary: 110 mg/dL — ABNORMAL HIGH (ref 65–99)

## 2016-01-26 LAB — COMPREHENSIVE METABOLIC PANEL
ALT: 27 U/L (ref 17–63)
AST: 35 U/L (ref 15–41)
Albumin: 2.4 g/dL — ABNORMAL LOW (ref 3.5–5.0)
Alkaline Phosphatase: 97 U/L (ref 38–126)
Anion gap: 8 (ref 5–15)
BUN: 7 mg/dL (ref 6–20)
CALCIUM: 8.4 mg/dL — AB (ref 8.9–10.3)
CHLORIDE: 108 mmol/L (ref 101–111)
CO2: 23 mmol/L (ref 22–32)
CREATININE: 0.99 mg/dL (ref 0.61–1.24)
Glucose, Bld: 115 mg/dL — ABNORMAL HIGH (ref 65–99)
Potassium: 4.1 mmol/L (ref 3.5–5.1)
Sodium: 139 mmol/L (ref 135–145)
Total Bilirubin: 0.7 mg/dL (ref 0.3–1.2)
Total Protein: 4.6 g/dL — ABNORMAL LOW (ref 6.5–8.1)

## 2016-01-26 LAB — CBC
HCT: 25.9 % — ABNORMAL LOW (ref 39.0–52.0)
HEMOGLOBIN: 8.1 g/dL — AB (ref 13.0–17.0)
MCH: 28.4 pg (ref 26.0–34.0)
MCHC: 31.3 g/dL (ref 30.0–36.0)
MCV: 90.9 fL (ref 78.0–100.0)
PLATELETS: 99 10*3/uL — AB (ref 150–400)
RBC: 2.85 MIL/uL — AB (ref 4.22–5.81)
RDW: 15.1 % (ref 11.5–15.5)
WBC: 4.1 10*3/uL (ref 4.0–10.5)

## 2016-01-26 LAB — PROTIME-INR
INR: 1.34 (ref 0.00–1.49)
PROTHROMBIN TIME: 16.7 s — AB (ref 11.6–15.2)

## 2016-01-26 LAB — AMMONIA: AMMONIA: 40 umol/L — AB (ref 9–35)

## 2016-01-26 MED ORDER — RIFAXIMIN 550 MG PO TABS
550.0000 mg | ORAL_TABLET | Freq: Two times a day (BID) | ORAL | Status: DC
Start: 2016-01-26 — End: 2018-12-01

## 2016-01-26 MED ORDER — LACTULOSE 10 GM/15ML PO SOLN: 20.0000 g | Freq: Every day | ORAL | Status: DC

## 2016-01-26 MED ORDER — FUROSEMIDE 10 MG/ML IJ SOLN
100.0000 mg | Freq: Once | INTRAVENOUS | Status: AC
  Administered 2016-01-26: 100 mg via INTRAVENOUS
  Filled 2016-01-26: qty 10

## 2016-01-26 MED ORDER — FUROSEMIDE 80 MG PO TABS: 80.0000 mg | ORAL_TABLET | Freq: Every day | ORAL | Status: DC

## 2016-01-26 NOTE — Progress Notes (Signed)
[facility] Progress Note  Subjective: Doing well today. No complaints. He is eating. He wants to go home.  Objective: Vital signs in last 24 hours: Temp:  [98.1 F (36.7 C)-98.5 F (36.9 C)] 98.5 F (36.9 C) (02/23 0449) Pulse Rate:  [91-103] 94 (02/23 0449) Resp:  [18] 18 (02/23 0449) BP: (105-127)/(46-55) 105/46 mmHg (02/23 0954) SpO2:  [99 %-100 %] 100 % (02/23 0449) Weight:  [91.4 kg (201 lb 8 oz)] 91.4 kg (201 lb 8 oz) (02/23 0447) Weight change: 1.588 kg (3 lb 8 oz)   PE:  No distress  Heart regular rhythm  Lungs clear  Abdomen nontender  Lab Results: Results for orders placed or performed during the hospital encounter of 01/20/16 (from the past 24 hour(s))  Glucose, capillary     Status: None   Collection Time: 01/25/16 11:31 AM  Result Value Ref Range   Glucose-Capillary 88 65 - 99 mg/dL   Comment 1 Notify RN   Glucose, capillary     Status: Abnormal   Collection Time: 01/25/16  4:26 PM  Result Value Ref Range   Glucose-Capillary 114 (H) 65 - 99 mg/dL  Glucose, capillary     Status: Abnormal   Collection Time: 01/25/16  8:10 PM  Result Value Ref Range   Glucose-Capillary 170 (H) 65 - 99 mg/dL  Ammonia     Status: Abnormal   Collection Time: 01/26/16  3:20 AM  Result Value Ref Range   Ammonia 40 (H) 9 - 35 umol/L  CBC     Status: Abnormal   Collection Time: 01/26/16  3:45 AM  Result Value Ref Range   WBC 4.1 4.0 - 10.5 K/uL   RBC 2.85 (L) 4.22 - 5.81 MIL/uL   Hemoglobin 8.1 (L) 13.0 - 17.0 g/dL   HCT 25.9 (L) 39.0 - 52.0 %   MCV 90.9 78.0 - 100.0 fL   MCH 28.4 26.0 - 34.0 pg   MCHC 31.3 30.0 - 36.0 g/dL   RDW 15.1 11.5 - 15.5 %   Platelets 99 (L) 150 - 400 K/uL  Protime-INR     Status: Abnormal   Collection Time: 01/26/16  3:45 AM  Result Value Ref Range   Prothrombin Time 16.7 (H) 11.6 - 15.2 seconds   INR 1.34 0.00 - 1.49  Comprehensive metabolic panel     Status: Abnormal   Collection Time: 01/26/16  3:45 AM  Result Value Ref  Range   Sodium 139 135 - 145 mmol/L   Potassium 4.1 3.5 - 5.1 mmol/L   Chloride 108 101 - 111 mmol/L   CO2 23 22 - 32 mmol/L   Glucose, Bld 115 (H) 65 - 99 mg/dL   BUN 7 6 - 20 mg/dL   Creatinine, Ser 0.99 0.61 - 1.24 mg/dL   Calcium 8.4 (L) 8.9 - 10.3 mg/dL   Total Protein 4.6 (L) 6.5 - 8.1 g/dL   Albumin 2.4 (L) 3.5 - 5.0 g/dL   AST 35 15 - 41 U/L   ALT 27 17 - 63 U/L   Alkaline Phosphatase 97 38 - 126 U/L   Total Bilirubin 0.7 0.3 - 1.2 mg/dL   GFR calc non Af Amer >60 >60 mL/min   GFR calc Af Amer >60 >60 mL/min   Anion gap 8 5 - 15  Glucose, capillary     Status: None   Collection Time: 01/26/16  6:45 AM  Result Value Ref Range   Glucose-Capillary 94 65 - 99 mg/dL    Studies/Results: No  results found.    Assessment: GI bleed. The patient had a duodenal AVM which was cauterized.  Cirrhosis of liver    Plan:   I think he is stable for discharge home. He has an appointment to see Dr. Michail Sermon in the next couple of weeks.    John Warren 01/26/2016, 10:04 AM  Pager: [phone] If no answer or after 5 PM call [phone] Lab Results  Component Value Date   HGB 8.1* 01/26/2016   HGB 8.4* 01/25/2016   HGB 7.4* 01/24/2016   HCT 25.9* 01/26/2016   HCT 26.8* 01/25/2016   HCT 23.4* 01/24/2016   ALKPHOS 97 01/26/2016   ALKPHOS 81 01/20/2016   ALKPHOS 68 12/30/2015   AST 35 01/26/2016   AST 31 01/20/2016   AST 28 12/30/2015   ALT 27 01/26/2016   ALT 22 01/20/2016   ALT 23 12/30/2015

## 2016-01-26 NOTE — Discharge Summary (Signed)
Discharge Summary  John Warren [id number] DOB: 1951/10/19  PCP: Leonard Downing, MD  Admit date: 01/20/2016 Discharge date: 01/26/2016  Time spent: <16mins  Recommendations for Outpatient Follow-up:  1. F/u with PMD within a week for hospital discharge follow up, repeat cbc/cmp at follow up 2. F/u with [facility] as scheduled  Discharge Diagnoses:  Active Hospital Problems   Diagnosis Date Noted  . Acute blood loss anemia 12/29/2015  . GI bleed 01/20/2016  . Symptomatic anemia 12/29/2015  . Acute GI bleeding 12/29/2015  . AVM (arteriovenous malformation) of colon   . Portal hypertensive gastropathy   . COPD (chronic obstructive pulmonary disease) ([place]) 07/13/2015  . Cirrhosis, alcoholic ([place]) [id number]  . Iron deficiency anemia due to chronic blood loss 07/11/2015    Resolved Hospital Problems   Diagnosis Date Noted Date Resolved  No resolved problems to display.    Discharge Condition: stable  Diet recommendation: heart healthy  Filed Weights   01/24/16 0805 01/25/16 0435 01/26/16 0447  Weight: 89.812 kg (198 lb) 91.037 kg (200 lb 11.2 oz) 91.4 kg (201 lb 8 oz)    History of present illness:  John Warren is a 65 y.o. male with a past medical history that includes cirrhosis of the liver, EtOH abuse, diastolic dysfunction grade 2, AVM of the colon, portal hypertension gastropathy, thrombocytopenia, sigmoid diverticulosis presents to the emergency department with the chief complaint rectal bleeding. Initial evaluation in the emergency department reveals acute blood loss anemia, tachycardia.  Information is obtained from the patient who reports he was discharged from the hospital approximately 21 days ago after being hospitalized for GI bleed. He reports doing well until 4 days ago when he noticed his stool turning dark. Over the next 3 days he describes more frequent stools that are loose and getting "darker and darker". This morning he awakened expelled bright  red blood per rectum. Associated symptoms include some mild shortness of breath and mild lower extremity edema. He also reports 2 episodes of nausea without vomiting. He denies any chest pain palpitation headache dizziness syncope or near-syncope. He denies any fever chills cough. He denies any EtOH consumption since his discharge. Denies NSAID use but does take 2 Tylenol per day for pain. Nizoral any other anticoagulation  In the emergency department he is afebrile hemodynamically stable with a heart rate around 117 he is not hypoxic. He was provided with IV fluids and is typed for 2 units of packed red blood cells.  Hospital Course:  Principal Problem:   Acute blood loss anemia Active Problems:   Iron deficiency anemia due to chronic blood loss   COPD (chronic obstructive pulmonary disease) (HCC)   Cirrhosis, alcoholic (HCC)   AVM (arteriovenous malformation) of colon   Portal hypertensive gastropathy   Symptomatic anemia   Acute GI bleeding   GI bleed  65 year old with PMH male with a past medical history that includes cirrhosis of the liver, diastolic dysfunction grade 2, AVM of the colon, portal hypertension gastropathy, thrombocytopenia, sigmoid diverticulosis presents to the emergency department with the chief complaint rectal bleeding. Initial evaluation in the emergency department reveals acute blood loss anemia, tachycardia.  Patient admitted with melena, has received total 4 units PRBC , counting PRBC from today. He was started on Protonix Gtt, subsequently transition to IV BID Protonix. He underwent endoscopy 2-21 found to have AVM S/P cauterized with argon plasma coagulator.   1. Acute blood loss anemia., GI bleed.  - History of same in the setting  of portal hypertension with esophageal varices and colonic AVM. Hemoglobin 6.9 on admission. Chart review indicates hemoglobin was 10.02 weeks ago. Right red blood with dark stool around rectum on exam. He underwent EGD last  hospitalization which did not show any active bleed but did confirm evidence of esophageal and possible gastric varices. -Received 2 units of packed red blood cells 2-17 , one unit PRBC 2-18. One unit PRBC on 2-21.  -Has received 4 units total of PRBC -Continue with protonix Gtt ---change to protonix IV BID> change to oral omeprazole -S/P endoscopy: Duodenal AVM S/P Cauterized with argon Plasma Coagulator.  Repeat hb in am hgb stable, tolerating regular diet  3. Tachycardia. Likely related to above. Denies chest pain palpitations. Resolved, sinus rhythm on tele now.  4. 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 Home medications include spironolactone and Lasix. Blood pressure on the soft side on admission -resume lasix on 2/21, resume spironolactone 2/23 -ammonia at 54, started low dose lactulose. Start rifximin, No confusion today  5. History of diastolic heart failure. Last EF 55% with a grade 2 diastolic dysfunction. -Monitor daily weight -Continue Lasix, spironolactone, replaced Potasium.   6. Hyperglycemia. Serum glucose 189 on admission. Denies history of diabetes -hemoglobin A1c 5.6.  -Use sliding scale insulin for control  7. EtOH abuse. Denies alcohol for 20 days. -Social work - on [employer].  -no evidence of withdrawal.   Code Status: Full Code.  Family Communication: care discussed with patient  Disposition Plan: home on 2/23  With gi clearance   Consultants:  [facility]  Procedures:  EGD on 2/21  Antibiotics:  none   Discharge Exam: BP 105/46 mmHg  Pulse 94  Temp(Src) 98.5 F (36.9 C) (Oral)  Resp 18  Ht 6\' 3"  (1.905 m)  Wt 91.4 kg (201 lb 8 oz)  BMI 25.19 kg/m2  SpO2 100%  General: aaox3 Cardiovascular: rrr Respiratory: ctabl Ab: + ascites, nontender Extremity: pitting edema, left >right   Discharge Instructions You were cared for by a hospitalist during your hospital stay. If you have any questions about your discharge medications  or the care you received while you were in the hospital after you are discharged, you can call the unit and asked to speak with the hospitalist on call if the hospitalist that took care of you is not available. Once you are discharged, your primary care physician will handle any further medical issues. Please note that NO REFILLS for any discharge medications will be authorized once you are discharged, as it is imperative that you return to your primary care physician (or establish a relationship with a primary care physician if you do not have one) for your aftercare needs so that they can reassess your need for medications and monitor your lab values.      Discharge Instructions    Diet - low sodium heart healthy    Complete by:  As directed      Increase activity slowly    Complete by:  As directed             Medication List    STOP taking these medications        acetaminophen 500 MG tablet  Commonly known as:  TYLENOL      TAKE these medications        ferrous sulfate 325 (65 FE) MG tablet  Take 1 tablet (325 mg total) by mouth 3 (three) times daily with meals.     folic acid 1 MG tablet  Commonly known as:  FOLVITE  Take 1 tablet (1 mg total) by mouth daily.     furosemide 80 MG tablet  Commonly known as:  LASIX  Take 80 mg by mouth daily.     lactulose 10 GM/15ML solution  Commonly known as:  CHRONULAC  Take 30 mLs (20 g total) by mouth daily.     nitroGLYCERIN 0.4 MG SL tablet  Commonly known as:  NITROSTAT  Place 0.4 mg under the tongue every 5 (five) minutes as needed for chest pain.     omeprazole 40 MG capsule  Commonly known as:  PRILOSEC  Take 1 capsule (40 mg total) by mouth every evening.     potassium chloride SA 20 MEQ tablet  Commonly known as:  K-DUR,KLOR-CON  Take 20 mEq by mouth 2 (two) times daily.     rifaximin 550 MG Tabs tablet  Commonly known as:  XIFAXAN  Take 1 tablet (550 mg total) by mouth 2 (two) times daily.     spironolactone 50  MG tablet  Commonly known as:  ALDACTONE  Take 1 tablet (50 mg total) by mouth daily.     thiamine 100 MG tablet  Take 1 tablet (100 mg total) by mouth daily.       Allergies  Allergen Reactions  . Penicillins     Rash    Follow-up Information    Follow up with Leonard Downing, MD On 02/02/2016.   Specialty:  Family Medicine   Why:  hospital discharge follow up, repeat cbc/bmp at follow up/ pt app is on 02/02/16 @9 :00   Contact information:   [place] [address] [phone]       Follow up with Lear Ng., MD On 02/03/2016.   Specialty:  Gastroenterology   Why:  pt app is on 02/03/16 @9 :30    Contact information:   [address] [place] [address] [phone]        The results of significant diagnostics from this hospitalization (including imaging, microbiology, ancillary and laboratory) are listed below for reference.    Significant Diagnostic Studies: Dg Chest 2 View  12/29/2015  CLINICAL DATA:  Chest pain, shortness of breath for 3 days EXAM: CHEST  2 VIEW COMPARISON:  07/12/2015 FINDINGS: Cardiomediastinal silhouette is stable. Again noted hyperinflation and chronic mild interstitial prominence. Streaky right infrahilar bronchitic changes or early infiltrate. No pulmonary edema. Stable degenerative changes thoracic spine. IMPRESSION: Again noted hyperinflation and chronic mild interstitial prominence. No pulmonary edema. Streaky right infrahilar bronchitic changes or early infiltrate. Electronically Signed   By: Lahoma Crocker M.D.   On: 12/29/2015 19:04    Microbiology: No results found for this or any previous visit (from the past 240 hour(s)).   Labs: Basic Metabolic Panel:  Recent Labs Lab 01/22/16 0258 01/23/16 0400 01/24/16 0318 01/25/16 0400 01/26/16 0345  NA 140 137 138 137 139  K 3.6 3.2* 3.4* 4.6 4.1  CL 115* 108 109 110 108  CO2 18* 20* 21* 22 23  GLUCOSE 117* 114* 108* 102* 115*  BUN 15 13 12 7 7    CREATININE 1.00 1.01 0.94 0.99 0.99  CALCIUM 8.2* 8.1* 8.1* 8.3* 8.4*   Liver Function Tests:  Recent Labs Lab 01/20/16 1030 01/26/16 0345  AST 31 35  ALT 22 27  ALKPHOS 81 97  BILITOT 0.8 0.7  PROT 5.3* 4.6*  ALBUMIN 2.8* 2.4*   No results for input(s): LIPASE, AMYLASE in the last 168 hours.  Recent  Labs Lab 01/20/16 1922 01/26/16 0320  AMMONIA 54* 40*   CBC:  Recent Labs Lab 01/22/16 0258  01/23/16 0400 01/23/16 1914 01/24/16 0318 01/25/16 0400 01/26/16 0345  WBC 4.7  --  5.9  --  4.3 4.1 4.1  HGB 7.6*  < > 7.9* 8.0* 7.4* 8.4* 8.1*  HCT 23.8*  < > 24.6* 25.2* 23.4* 26.8* 25.9*  MCV 89.8  --  89.5  --  89.7 90.5 90.9  PLT 76*  --  92*  --  87* 95* 99*  < > = values in this interval not displayed. Cardiac Enzymes: No results for input(s): CKTOTAL, CKMB, CKMBINDEX, TROPONINI in the last 168 hours. BNP: BNP (last 3 results)  Recent Labs  07/11/15 1700 07/11/15 2146  BNP 327.0* 331.3*    ProBNP (last 3 results) No results for input(s): PROBNP in the last 8760 hours.  CBG:  Recent Labs Lab 01/25/16 1131 01/25/16 1626 01/25/16 2010 01/26/16 0645 01/26/16 1112  GLUCAP 88 114* 170* 94 110*       Signed:  Wylene Weissman MD, PhD  [facility] 01/26/2016, 10:10 PM

## 2016-05-07 ENCOUNTER — Other Ambulatory Visit: Payer: Self-pay | Admitting: Gastroenterology

## 2016-05-07 DIAGNOSIS — K7460 Unspecified cirrhosis of liver: Secondary | ICD-10-CM

## 2016-05-18 ENCOUNTER — Ambulatory Visit
Admission: RE | Admit: 2016-05-18 | Discharge: 2016-05-18 | Disposition: A | Discharge status: Home / Self Care | Payer: Medicare Other | Source: Ambulatory Visit | Attending: Gastroenterology | Admitting: Gastroenterology

## 2016-05-18 DIAGNOSIS — K7460 Unspecified cirrhosis of liver: Secondary | ICD-10-CM

## 2016-10-31 ENCOUNTER — Other Ambulatory Visit: Payer: Self-pay | Admitting: Gastroenterology

## 2016-10-31 DIAGNOSIS — K7469 Other cirrhosis of liver: Secondary | ICD-10-CM

## 2016-11-13 ENCOUNTER — Ambulatory Visit
Admission: RE | Admit: 2016-11-13 | Discharge: 2016-11-13 | Disposition: A | Discharge status: Home / Self Care | Payer: Medicare Other | Source: Ambulatory Visit | Attending: Gastroenterology | Admitting: Gastroenterology

## 2016-11-13 DIAGNOSIS — K7469 Other cirrhosis of liver: Secondary | ICD-10-CM

## 2017-03-29 ENCOUNTER — Inpatient Hospital Stay (HOSPITAL_COMMUNITY)
Admission: EM | Admit: 2017-03-29 | Discharge: 2017-04-17 | DRG: 405 | Disposition: A | Discharge status: Home / Self Care | Payer: Medicare Other | Attending: Internal Medicine | Admitting: Internal Medicine

## 2017-03-29 ENCOUNTER — Encounter (HOSPITAL_COMMUNITY): Payer: Self-pay | Admitting: Emergency Medicine

## 2017-03-29 ENCOUNTER — Inpatient Hospital Stay (HOSPITAL_COMMUNITY): Payer: Medicare Other

## 2017-03-29 DIAGNOSIS — E8809 Other disorders of plasma-protein metabolism, not elsewhere classified: Secondary | ICD-10-CM | POA: Diagnosis present

## 2017-03-29 DIAGNOSIS — K3189 Other diseases of stomach and duodenum: Secondary | ICD-10-CM | POA: Diagnosis not present

## 2017-03-29 DIAGNOSIS — I8511 Secondary esophageal varices with bleeding: Secondary | ICD-10-CM | POA: Diagnosis present

## 2017-03-29 DIAGNOSIS — Z01818 Encounter for other preprocedural examination: Secondary | ICD-10-CM

## 2017-03-29 DIAGNOSIS — K766 Portal hypertension: Secondary | ICD-10-CM | POA: Diagnosis present

## 2017-03-29 DIAGNOSIS — Z79899 Other long term (current) drug therapy: Secondary | ICD-10-CM

## 2017-03-29 DIAGNOSIS — R188 Other ascites: Secondary | ICD-10-CM

## 2017-03-29 DIAGNOSIS — K5730 Diverticulosis of large intestine without perforation or abscess without bleeding: Secondary | ICD-10-CM | POA: Diagnosis present

## 2017-03-29 DIAGNOSIS — I214 Non-ST elevation (NSTEMI) myocardial infarction: Secondary | ICD-10-CM

## 2017-03-29 DIAGNOSIS — I5041 Acute combined systolic (congestive) and diastolic (congestive) heart failure: Secondary | ICD-10-CM | POA: Diagnosis not present

## 2017-03-29 DIAGNOSIS — R0902 Hypoxemia: Secondary | ICD-10-CM | POA: Diagnosis not present

## 2017-03-29 DIAGNOSIS — Y95 Nosocomial condition: Secondary | ICD-10-CM | POA: Diagnosis not present

## 2017-03-29 DIAGNOSIS — I25118 Atherosclerotic heart disease of native coronary artery with other forms of angina pectoris: Secondary | ICD-10-CM | POA: Diagnosis not present

## 2017-03-29 DIAGNOSIS — A419 Sepsis, unspecified organism: Secondary | ICD-10-CM | POA: Diagnosis not present

## 2017-03-29 DIAGNOSIS — J9601 Acute respiratory failure with hypoxia: Secondary | ICD-10-CM

## 2017-03-29 DIAGNOSIS — E8770 Fluid overload, unspecified: Secondary | ICD-10-CM | POA: Diagnosis not present

## 2017-03-29 DIAGNOSIS — Z880 Allergy status to penicillin: Secondary | ICD-10-CM

## 2017-03-29 DIAGNOSIS — I8500 Esophageal varices without bleeding: Secondary | ICD-10-CM | POA: Diagnosis present

## 2017-03-29 DIAGNOSIS — I5032 Chronic diastolic (congestive) heart failure: Secondary | ICD-10-CM | POA: Diagnosis not present

## 2017-03-29 DIAGNOSIS — D696 Thrombocytopenia, unspecified: Secondary | ICD-10-CM | POA: Diagnosis present

## 2017-03-29 DIAGNOSIS — R571 Hypovolemic shock: Secondary | ICD-10-CM | POA: Diagnosis present

## 2017-03-29 DIAGNOSIS — J9600 Acute respiratory failure, unspecified whether with hypoxia or hypercapnia: Secondary | ICD-10-CM

## 2017-03-29 DIAGNOSIS — I2510 Atherosclerotic heart disease of native coronary artery without angina pectoris: Secondary | ICD-10-CM | POA: Diagnosis present

## 2017-03-29 DIAGNOSIS — R579 Shock, unspecified: Secondary | ICD-10-CM | POA: Diagnosis not present

## 2017-03-29 DIAGNOSIS — J810 Acute pulmonary edema: Secondary | ICD-10-CM | POA: Diagnosis not present

## 2017-03-29 DIAGNOSIS — J9811 Atelectasis: Secondary | ICD-10-CM | POA: Diagnosis not present

## 2017-03-29 DIAGNOSIS — K5521 Angiodysplasia of colon with hemorrhage: Secondary | ICD-10-CM | POA: Diagnosis present

## 2017-03-29 DIAGNOSIS — D62 Acute posthemorrhagic anemia: Secondary | ICD-10-CM | POA: Diagnosis present

## 2017-03-29 DIAGNOSIS — R059 Cough, unspecified: Secondary | ICD-10-CM

## 2017-03-29 DIAGNOSIS — I959 Hypotension, unspecified: Secondary | ICD-10-CM | POA: Diagnosis not present

## 2017-03-29 DIAGNOSIS — R578 Other shock: Secondary | ICD-10-CM | POA: Diagnosis not present

## 2017-03-29 DIAGNOSIS — Z8739 Personal history of other diseases of the musculoskeletal system and connective tissue: Secondary | ICD-10-CM | POA: Diagnosis not present

## 2017-03-29 DIAGNOSIS — I110 Hypertensive heart disease with heart failure: Secondary | ICD-10-CM | POA: Diagnosis present

## 2017-03-29 DIAGNOSIS — I8510 Secondary esophageal varices without bleeding: Secondary | ICD-10-CM | POA: Diagnosis not present

## 2017-03-29 DIAGNOSIS — R6521 Severe sepsis with septic shock: Secondary | ICD-10-CM | POA: Diagnosis not present

## 2017-03-29 DIAGNOSIS — R0602 Shortness of breath: Secondary | ICD-10-CM

## 2017-03-29 DIAGNOSIS — E876 Hypokalemia: Secondary | ICD-10-CM | POA: Diagnosis not present

## 2017-03-29 DIAGNOSIS — Z833 Family history of diabetes mellitus: Secondary | ICD-10-CM | POA: Diagnosis not present

## 2017-03-29 DIAGNOSIS — I472 Ventricular tachycardia: Secondary | ICD-10-CM | POA: Diagnosis not present

## 2017-03-29 DIAGNOSIS — K7460 Unspecified cirrhosis of liver: Secondary | ICD-10-CM | POA: Diagnosis present

## 2017-03-29 DIAGNOSIS — I360 Nonrheumatic tricuspid (valve) stenosis: Secondary | ICD-10-CM | POA: Diagnosis not present

## 2017-03-29 DIAGNOSIS — Z9689 Presence of other specified functional implants: Secondary | ICD-10-CM | POA: Diagnosis not present

## 2017-03-29 DIAGNOSIS — J189 Pneumonia, unspecified organism: Secondary | ICD-10-CM | POA: Diagnosis not present

## 2017-03-29 DIAGNOSIS — F419 Anxiety disorder, unspecified: Secondary | ICD-10-CM | POA: Diagnosis present

## 2017-03-29 DIAGNOSIS — E875 Hyperkalemia: Secondary | ICD-10-CM | POA: Diagnosis present

## 2017-03-29 DIAGNOSIS — I248 Other forms of acute ischemic heart disease: Secondary | ICD-10-CM | POA: Diagnosis not present

## 2017-03-29 DIAGNOSIS — I864 Gastric varices: Secondary | ICD-10-CM

## 2017-03-29 DIAGNOSIS — T8119XA Other postprocedural shock, initial encounter: Secondary | ICD-10-CM | POA: Diagnosis not present

## 2017-03-29 DIAGNOSIS — J9584 Transfusion-related acute lung injury (TRALI): Secondary | ICD-10-CM | POA: Diagnosis not present

## 2017-03-29 DIAGNOSIS — Q2733 Arteriovenous malformation of digestive system vessel: Secondary | ICD-10-CM | POA: Diagnosis not present

## 2017-03-29 DIAGNOSIS — Z87891 Personal history of nicotine dependence: Secondary | ICD-10-CM

## 2017-03-29 DIAGNOSIS — J440 Chronic obstructive pulmonary disease with acute lower respiratory infection: Secondary | ICD-10-CM | POA: Diagnosis present

## 2017-03-29 DIAGNOSIS — Z9889 Other specified postprocedural states: Secondary | ICD-10-CM | POA: Diagnosis not present

## 2017-03-29 DIAGNOSIS — Y849 Medical procedure, unspecified as the cause of abnormal reaction of the patient, or of later complication, without mention of misadventure at the time of the procedure: Secondary | ICD-10-CM | POA: Diagnosis not present

## 2017-03-29 DIAGNOSIS — K5520 Angiodysplasia of colon without hemorrhage: Secondary | ICD-10-CM

## 2017-03-29 DIAGNOSIS — R601 Generalized edema: Secondary | ICD-10-CM | POA: Diagnosis not present

## 2017-03-29 DIAGNOSIS — I5043 Acute on chronic combined systolic (congestive) and diastolic (congestive) heart failure: Secondary | ICD-10-CM | POA: Diagnosis not present

## 2017-03-29 DIAGNOSIS — K921 Melena: Secondary | ICD-10-CM | POA: Diagnosis present

## 2017-03-29 DIAGNOSIS — I471 Supraventricular tachycardia: Secondary | ICD-10-CM | POA: Diagnosis not present

## 2017-03-29 DIAGNOSIS — K7290 Hepatic failure, unspecified without coma: Secondary | ICD-10-CM | POA: Diagnosis not present

## 2017-03-29 DIAGNOSIS — I4729 Other ventricular tachycardia: Secondary | ICD-10-CM

## 2017-03-29 DIAGNOSIS — I9581 Postprocedural hypotension: Secondary | ICD-10-CM | POA: Diagnosis not present

## 2017-03-29 DIAGNOSIS — R05 Cough: Secondary | ICD-10-CM

## 2017-03-29 DIAGNOSIS — I8501 Esophageal varices with bleeding: Secondary | ICD-10-CM | POA: Diagnosis not present

## 2017-03-29 DIAGNOSIS — J9690 Respiratory failure, unspecified, unspecified whether with hypoxia or hypercapnia: Secondary | ICD-10-CM

## 2017-03-29 DIAGNOSIS — K648 Other hemorrhoids: Secondary | ICD-10-CM | POA: Diagnosis present

## 2017-03-29 DIAGNOSIS — Z452 Encounter for adjustment and management of vascular access device: Secondary | ICD-10-CM

## 2017-03-29 DIAGNOSIS — J9602 Acute respiratory failure with hypercapnia: Secondary | ICD-10-CM | POA: Diagnosis not present

## 2017-03-29 DIAGNOSIS — G9340 Encephalopathy, unspecified: Secondary | ICD-10-CM | POA: Diagnosis not present

## 2017-03-29 DIAGNOSIS — K922 Gastrointestinal hemorrhage, unspecified: Secondary | ICD-10-CM

## 2017-03-29 DIAGNOSIS — I5021 Acute systolic (congestive) heart failure: Secondary | ICD-10-CM | POA: Diagnosis not present

## 2017-03-29 DIAGNOSIS — K7031 Alcoholic cirrhosis of liver with ascites: Principal | ICD-10-CM

## 2017-03-29 DIAGNOSIS — F1021 Alcohol dependence, in remission: Secondary | ICD-10-CM | POA: Diagnosis not present

## 2017-03-29 DIAGNOSIS — F1011 Alcohol abuse, in remission: Secondary | ICD-10-CM | POA: Diagnosis present

## 2017-03-29 DIAGNOSIS — R079 Chest pain, unspecified: Secondary | ICD-10-CM | POA: Diagnosis not present

## 2017-03-29 DIAGNOSIS — D72829 Elevated white blood cell count, unspecified: Secondary | ICD-10-CM | POA: Diagnosis not present

## 2017-03-29 DIAGNOSIS — K7030 Alcoholic cirrhosis of liver without ascites: Secondary | ICD-10-CM | POA: Diagnosis not present

## 2017-03-29 DIAGNOSIS — Z960 Presence of urogenital implants: Secondary | ICD-10-CM | POA: Diagnosis not present

## 2017-03-29 DIAGNOSIS — Z8719 Personal history of other diseases of the digestive system: Secondary | ICD-10-CM | POA: Diagnosis not present

## 2017-03-29 DIAGNOSIS — K219 Gastro-esophageal reflux disease without esophagitis: Secondary | ICD-10-CM | POA: Diagnosis present

## 2017-03-29 DIAGNOSIS — K625 Hemorrhage of anus and rectum: Secondary | ICD-10-CM

## 2017-03-29 DIAGNOSIS — I700 Atherosclerosis of aorta: Secondary | ICD-10-CM | POA: Diagnosis present

## 2017-03-29 LAB — CBC WITH DIFFERENTIAL/PLATELET
Basophils Absolute: 0 10*3/uL (ref 0.0–0.1)
Basophils Relative: 0 %
Eosinophils Absolute: 0.1 10*3/uL (ref 0.0–0.7)
Eosinophils Relative: 1 %
HCT: 29.5 % — ABNORMAL LOW (ref 39.0–52.0)
Hemoglobin: 9.9 g/dL — ABNORMAL LOW (ref 13.0–17.0)
Lymphocytes Relative: 16 %
Lymphs Abs: 2.3 10*3/uL (ref 0.7–4.0)
MCH: 30.7 pg (ref 26.0–34.0)
MCHC: 33.6 g/dL (ref 30.0–36.0)
MCV: 91.6 fL (ref 78.0–100.0)
Monocytes Absolute: 1.8 10*3/uL — ABNORMAL HIGH (ref 0.1–1.0)
Monocytes Relative: 13 %
Neutro Abs: 10.4 10*3/uL — ABNORMAL HIGH (ref 1.7–7.7)
Neutrophils Relative %: 71 %
Platelets: 112 10*3/uL — ABNORMAL LOW (ref 150–400)
RBC: 3.22 MIL/uL — ABNORMAL LOW (ref 4.22–5.81)
RDW: 14 % (ref 11.5–15.5)
WBC: 14.6 10*3/uL — ABNORMAL HIGH (ref 4.0–10.5)

## 2017-03-29 LAB — COMPREHENSIVE METABOLIC PANEL
ALT: 27 U/L (ref 17–63)
AST: 32 U/L (ref 15–41)
Albumin: 3.4 g/dL — ABNORMAL LOW (ref 3.5–5.0)
Alkaline Phosphatase: 86 U/L (ref 38–126)
Anion gap: 6 (ref 5–15)
BUN: 29 mg/dL — ABNORMAL HIGH (ref 6–20)
CO2: 22 mmol/L (ref 22–32)
Calcium: 9.2 mg/dL (ref 8.9–10.3)
Chloride: 107 mmol/L (ref 101–111)
Creatinine, Ser: 0.92 mg/dL (ref 0.61–1.24)
GFR calc Af Amer: >60 mL/min (ref >60)
GFR calc non Af Amer: >60 mL/min (ref >60)
Glucose, Bld: 154 mg/dL — ABNORMAL HIGH (ref 65–99)
Potassium: 6.1 mmol/L — ABNORMAL HIGH (ref 3.5–5.1)
Sodium: 135 mmol/L (ref 135–145)
Total Bilirubin: 1.3 mg/dL — ABNORMAL HIGH (ref 0.3–1.2)
Total Protein: 6 g/dL — ABNORMAL LOW (ref 6.5–8.1)

## 2017-03-29 LAB — BASIC METABOLIC PANEL
Anion gap: 9 (ref 5–15)
BUN: 34 mg/dL — ABNORMAL HIGH (ref 6–20)
CO2: 19 mmol/L — ABNORMAL LOW (ref 22–32)
Calcium: 8.4 mg/dL — ABNORMAL LOW (ref 8.9–10.3)
Chloride: 107 mmol/L (ref 101–111)
Creatinine, Ser: 0.91 mg/dL (ref 0.61–1.24)
GFR calc Af Amer: >60 mL/min (ref >60)
GFR calc non Af Amer: >60 mL/min (ref >60)
Glucose, Bld: 149 mg/dL — ABNORMAL HIGH (ref 65–99)
Potassium: 5 mmol/L (ref 3.5–5.1)
Sodium: 135 mmol/L (ref 135–145)

## 2017-03-29 LAB — CBC
HCT: 36.7 % — ABNORMAL LOW (ref 39.0–52.0)
Hemoglobin: 12.4 g/dL — ABNORMAL LOW (ref 13.0–17.0)
MCH: 31.1 pg (ref 26.0–34.0)
MCHC: 33.8 g/dL (ref 30.0–36.0)
MCV: 92 fL (ref 78.0–100.0)
Platelets: 130 10*3/uL — ABNORMAL LOW (ref 150–400)
RBC: 3.99 MIL/uL — ABNORMAL LOW (ref 4.22–5.81)
RDW: 13.9 % (ref 11.5–15.5)
WBC: 13.6 10*3/uL — ABNORMAL HIGH (ref 4.0–10.5)

## 2017-03-29 LAB — LIPASE, BLOOD: Lipase: 31 U/L (ref 11–51)

## 2017-03-29 LAB — PROTIME-INR
INR: 1.34
Prothrombin Time: 16.7 seconds — ABNORMAL HIGH (ref 11.4–15.2)

## 2017-03-29 LAB — I-STAT CG4 LACTIC ACID, ED: Lactic Acid, Venous: 1.91 mmol/L (ref 0.5–1.9)

## 2017-03-29 LAB — POC OCCULT BLOOD, ED: Fecal Occult Bld: POSITIVE — AB

## 2017-03-29 LAB — CBG MONITORING, ED: Glucose-Capillary: 134 mg/dL — ABNORMAL HIGH (ref 65–99)

## 2017-03-29 MED ORDER — SODIUM CHLORIDE 0.9 % IV BOLUS (SEPSIS)
1000.0000 mL | Freq: Once | INTRAVENOUS | Status: AC
  Administered 2017-03-29: 1000 mL via INTRAVENOUS

## 2017-03-29 MED ORDER — ACETAMINOPHEN 650 MG RE SUPP: 650.0000 mg | Freq: Four times a day (QID) | RECTAL | Status: DC | PRN

## 2017-03-29 MED ORDER — INSULIN ASPART 100 UNIT/ML ~~LOC~~ SOLN: 10.0000 [IU] | Freq: Once | SUBCUTANEOUS | Status: DC

## 2017-03-29 MED ORDER — INSULIN ASPART 100 UNIT/ML IV SOLN: 10.0000 [IU] | Freq: Once | INTRAVENOUS | Status: DC

## 2017-03-29 MED ORDER — ACETAMINOPHEN 325 MG PO TABS
650.0000 mg | ORAL_TABLET | Freq: Four times a day (QID) | ORAL | Status: DC | PRN
  Administered 2017-04-02 – 2017-04-03 (×3): 650 mg via ORAL
  Filled 2017-03-29 (×4): qty 2

## 2017-03-29 MED ORDER — PANTOPRAZOLE SODIUM 40 MG IV SOLR
40.0000 mg | Freq: Two times a day (BID) | INTRAVENOUS | Status: DC
  Administered 2017-03-30 – 2017-04-10 (×22): 40 mg via INTRAVENOUS
  Filled 2017-03-29 (×22): qty 40

## 2017-03-29 MED ORDER — DEXTROSE 10 % IV SOLN
Freq: Once | INTRAVENOUS | Status: AC
  Administered 2017-03-29: 22:00:00 via INTRAVENOUS

## 2017-03-29 MED ORDER — SODIUM CHLORIDE 0.9% FLUSH
3.0000 mL | Freq: Two times a day (BID) | INTRAVENOUS | Status: DC
  Administered 2017-03-29 – 2017-04-03 (×11): 3 mL via INTRAVENOUS

## 2017-03-29 MED ORDER — PANTOPRAZOLE SODIUM 40 MG IV SOLR
40.0000 mg | Freq: Once | INTRAVENOUS | Status: AC
  Administered 2017-03-29: 40 mg via INTRAVENOUS
  Filled 2017-03-29: qty 40

## 2017-03-29 MED ORDER — ONDANSETRON HCL 4 MG/2ML IJ SOLN: 4.0000 mg | Freq: Four times a day (QID) | INTRAMUSCULAR | Status: DC | PRN

## 2017-03-29 MED ORDER — SODIUM CHLORIDE 0.9 % IV SOLN
INTRAVENOUS | Status: DC
  Administered 2017-03-29: 22:00:00 via INTRAVENOUS

## 2017-03-29 MED ORDER — PANTOPRAZOLE SODIUM 40 MG IV SOLR: 40.0000 mg | Freq: Two times a day (BID) | INTRAVENOUS | Status: DC

## 2017-03-29 MED ORDER — ONDANSETRON HCL 4 MG PO TABS: 4.0000 mg | ORAL_TABLET | Freq: Four times a day (QID) | ORAL | Status: DC | PRN

## 2017-03-29 NOTE — ED Notes (Signed)
Patient returned from [facility].  No distress noted.  Patient and family taken to the floor at this time.  Belongings taken to the floor with family.

## 2017-03-29 NOTE — ED Triage Notes (Signed)
Pt reports hx of gi bleeds x2 in the past year, reports 1 episode of bright red rectal bleeding this morning. Pt a/ox4, resp e/u, skin appropriate for ethnicity, nad.

## 2017-03-29 NOTE — H&P (Signed)
Date: 03/29/2017               Patient Name:  John Warren MRN: [id number]  DOB: 12-01-1951 Age / Sex: 66 y.o., male   PCP: Leonard Downing, MD         Medical Service: Internal Medicine Teaching Service         Attending Physician: Dr. Lucious Groves, DO    First Contact: Dr. Velna Ochs Pager: [phone]  Second Contact: Dr. 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  The patient was in his usual state of health until this morning when he noticed that he was having severe heartburn and had a large dark tarry bowel movement. Following this bowel movement several hours later the patient had a second bowel movement this time which demonstrated bright red blood per rectum. Given that he has had issues with severe GI bleed in the past he came directly to the emergency department. He denies chest pain or shortness of breath. He denies dizziness or lightheadedness. He denies nausea or vomiting. He denies polyuria or dysuria. He denies constipation or diarrhea. He denies fevers or being around sick contacts. He does endorse some chills and increased cough over the previous week.  In the emergency department the patient was hypotensive but improved with IV fluids, tachycardic in the low 100s, afebrile and hemodynamically stable. Initial laboratory evaluation showed hyperkalemia with potassium of 6.1 and elevated BUN 29. CBC demonstrated a leukocytosis with a white blood cell count 13.6 and anemia with a hemoglobin of 12.4. Fecal occult blood test was  positive. The patient's case was discussed with gastroenterology who recommended starting the patient on IV Protonix, IV fluids and will officially see the patient in consultation tomorrow. He was then admitted to IMTS for further management and evaluation.    Meds:  Current Meds  Medication Sig  . acetaminophen (TYLENOL) 500 MG tablet Take 500-1,000 mg by mouth every 8 (eight) hours as needed (for pain).  . ferrous sulfate 325 (65 FE) MG tablet Take 1 tablet (325 mg total) by mouth 3 (three) times daily with meals. (Patient taking differently: Take 325 mg by mouth every evening. )  . folic acid (FOLVITE) 1 MG tablet Take 1 tablet (1 mg total) by mouth daily.  . furosemide (LASIX) 80 MG tablet Take 80 mg by mouth daily.  . nitroGLYCERIN (NITROSTAT) 0.4 MG SL tablet Place 0.4 mg under the tongue every 5 (five) minutes as needed for chest pain.  . potassium chloride SA (K-DUR,KLOR-CON) 20 MEQ tablet Take 20 mEq by mouth every morning.   Marland Kitchen spironolactone (ALDACTONE) 50 MG tablet Take 1 tablet (50 mg total) by mouth daily. (Patient taking differently: Take 50 mg by mouth 2 (two) times daily. )  . thiamine 100 MG tablet Take 1 tablet (100 mg total) by mouth daily.  . traMADol (ULTRAM) 50 MG tablet Take 50 mg by mouth every 6 (six) hours as needed (for pain).     Allergies: Allergies as of 03/29/2017 - Review  Complete 03/29/2017  Allergen Reaction Noted  . Penicillins Rash 07/11/2015   Past Medical History:  Diagnosis Date  . Alcohol abuse   . Anemia   . AVM (arteriovenous malformation) of colon    a. By colonoscopy 07/2015.  Marland Kitchen Cirrhosis of liver ([place])   . Colon polyps   . COPD (chronic obstructive pulmonary disease) ([place])   . Diastolic CHF ([place])   . GERD (gastroesophageal reflux disease)   . GI bleed 01/2016  . Hypertension   . Hypokalemia   . Internal hemorrhoids   . Portal hypertensive gastropathy ([place])    a. By EGD 07/2015.  . Sigmoid diverticulosis   . SVT (supraventricular  tachycardia) ([place])    a. 07/2015 in setting of severe hypokalemia.  . Symptomatic anemia    a. 07/2015 - felt multifactorial from cirrhosis and AVMs.  . Thrombocytopenia ([place])   . Tobacco use     Family History:  Family History  Problem Relation Age of Onset  . Liver cancer Mother   . Diabetes Mellitus II Sister      Social History:  Social History   Social History  . Marital status: Single    Spouse name: N/A  . Number of children: N/A  . Years of education: N/A   Occupational History  . Not on file.   Social History Main Topics  . Smoking status: Former Smoker    Packs/day: 2.00    Years: 40.00    Types: Cigarettes    Quit date: 08/14/2008  . Smokeless tobacco: Never Used  . Alcohol use 0.0 oz/week     Comment: 1 pint whiskey daily several beer weekly    01/2016  " no alcohol in 3 weeks "  . Drug use: No  . Sexual activity: Not on file   Other Topics Concern  . Not on file   Social History Narrative  . No narrative on file     Review of Systems: A complete ROS was negative except as per HPI.   Physical Exam: Blood pressure (!) 106/57, pulse (!) 105, temperature 98.2 F (36.8 C), temperature source Oral, resp. rate (!) 22, height 5' 11.5" (1.816 m), weight 92.5 kg (204 lb), SpO2 97 %. Physical Exam  Constitutional: He is oriented to person, place, and time. He appears well-developed and well-nourished.  HENT:  Head: Normocephalic and atraumatic.  Cardiovascular: Regular rhythm.   Murmur heard. Tachycardic, grade 3/6 holosystolic murmur appreciated in the left midclavicular line   GI: Soft. Bowel sounds are normal. He exhibits no distension.  Normal bowel sounds  Musculoskeletal: He exhibits edema and tenderness.  Neurological: He is alert and oriented to person, place, and time.     EKG: Sinus tachycardia  CXR: Not obtained  Assessment & Plan by Problem: Active Problems:   GI bleeding    # GI Bleed  Patient presents with one-day history of  GI bleed. The patient reports a history of both melena and bright red blood per rectum. Given the history of melena it would appear that this GI bleed is upper and proximal to the ligament of Treitz. Additional supporting evidence for this is an elevated BUN. Gastroenterology is aware of the patient and will see formally in consultation tomorrow. We will admit to the stepdown unit for close monitoring and follow-up labs overnight. We will also give aggressive IV hydration and monitor blood pressure. -- IV Protonix 40 mg twice a day -- Sodium chloride at 150 ML's per hour -- Gastroenterology to follow,  appreciate recs  # Hyperkalemia Patient's initial potassium at 6.1. This improved with IV fluids. Repeat potassium of 5.0. -- Resolved  # Leukocytosis White count 13.6. Patient does endorse progressive cough over the last week. Afebrile. Will evaluate with chest radiograph. -- Diagnostic chest radiograph  # HFpEF -- Hold furosemide  # Cirrhosis -- Hold spironolactone -- Hold lactulose  DVT/PE prophylaxis: SCDs FEN/GI: Nothing by mouth Code: Full code  Dispo: Admit patient to Inpatient with expected length of stay greater than 2 midnights.  Signed: Ophelia Shoulder, MD 03/29/2017, 10:29 PM  Pager: [phone]

## 2017-03-29 NOTE — ED Provider Notes (Signed)
John Warren DEPT Provider Note   CSN: [id number] Arrival date & time: 03/29/17  1614     History   Chief Complaint Chief Complaint  Patient presents with  . Rectal Bleeding    HPI John Warren is a 66 y.o. male.  The history is provided by the patient.  Rectal Bleeding  Quality:  Bright red Amount:  Moderate Duration:  1 day Timing:  Constant Chronicity:  Recurrent Context: spontaneously   Context: not anal fissures, not anal penetration, not defecation, not diarrhea, not foreign body, not hemorrhoids, not rectal injury and not rectal pain   Similar prior episodes: yes   Relieved by:  Nothing Worsened by:  Nothing Ineffective treatments:  None tried Associated symptoms: abdominal pain   Associated symptoms: no dizziness, no epistaxis, no fever, no hematemesis, no light-headedness, no loss of consciousness, no recent illness and no vomiting   Risk factors: no anticoagulant use and no NSAID use     Past Medical History:  Diagnosis Date  . Alcohol abuse   . Anemia   . AVM (arteriovenous malformation) of colon    a. By colonoscopy 07/2015.  Marland Kitchen Cirrhosis of liver ([place])   . Colon polyps   . COPD (chronic obstructive pulmonary disease) ([place])   . Diastolic CHF ([place])   . GERD (gastroesophageal reflux disease)   . GI bleed 01/2016  . Hypertension   . Hypokalemia   . Internal hemorrhoids   . Portal hypertensive gastropathy ([place])    a. By EGD 07/2015.  . Sigmoid diverticulosis   . SVT (supraventricular tachycardia) ([place])    a. 07/2015 in setting of severe hypokalemia.  . Symptomatic anemia    a. 07/2015 - felt multifactorial from cirrhosis and AVMs.  . Thrombocytopenia ([place])   . Tobacco use     Patient Active Problem List   Diagnosis Date Noted  . GI bleeding 03/29/2017  . SVT (supraventricular tachycardia) ([place])   . AVM (arteriovenous malformation) of colon   . Internal hemorrhoids   . Sigmoid diverticulosis   . Portal hypertensive gastropathy ([place])   . Colon  polyps   . Hypokalemia   . Alcohol abuse   . Tobacco use   . Thrombocytopenia ([place])   . COPD (chronic obstructive pulmonary disease) ([place]) 07/13/2015  . Cirrhosis, alcoholic ([place]) 55/73/2202  . Dyspnea 07/11/2015  . Edema 07/11/2015  . Iron deficiency anemia due to chronic blood loss 07/11/2015  . Acute on chronic diastolic congestive heart failure ([place]) 07/11/2015    Past Surgical History:  Procedure Laterality Date  . CATARACT EXTRACTION    . COLONOSCOPY N/A 07/15/2015   Procedure: COLONOSCOPY;  Surgeon: Wilford Corner, MD;  Location: [facility] ENDOSCOPY;  Service: Endoscopy;  Laterality: N/A;  . ESOPHAGOGASTRODUODENOSCOPY N/A 07/15/2015   Procedure: ESOPHAGOGASTRODUODENOSCOPY (EGD);  Surgeon: Wilford Corner, MD;  Location: [facility] ENDOSCOPY;  Service: Endoscopy;  Laterality: N/A;  . ESOPHAGOGASTRODUODENOSCOPY N/A 12/30/2015   Procedure: ESOPHAGOGASTRODUODENOSCOPY (EGD);  Surgeon: Ronald Lobo, MD;  Location: [facility] ENDOSCOPY;  Service: Endoscopy;  Laterality: N/A;  . ESOPHAGOGASTRODUODENOSCOPY (EGD) WITH PROPOFOL N/A 01/24/2016   Procedure: ESOPHAGOGASTRODUODENOSCOPY (EGD) WITH PROPOFOL;  Surgeon: Wonda Horner, MD;  Location: [facility] ENDOSCOPY;  Service: Endoscopy;  Laterality: N/A;  . HERNIA REPAIR    . HOT HEMOSTASIS N/A 01/24/2016   Procedure: HOT HEMOSTASIS (ARGON PLASMA COAGULATION/BICAP);  Surgeon: Wonda Horner, MD;  Location: [facility] ENDOSCOPY;  Service: Endoscopy;  Laterality: N/A;       Home Medications    Prior to Admission medications  Medication Sig Start Date End Date Taking? Authorizing Provider  acetaminophen (TYLENOL) 500 MG tablet Take 500-1,000 mg by mouth every 8 (eight) hours as needed (for pain).   Yes Historical Provider, MD  ferrous sulfate 325 (65 FE) MG tablet Take 1 tablet (325 mg total) by mouth 3 (three) times daily with meals. Patient taking differently: Take 325 mg by mouth every evening.  07/17/15  Yes Dayna N Dunn, PA-C  folic acid (FOLVITE) 1 MG tablet Take 1  tablet (1 mg total) by mouth daily. 01/02/16  Yes Thurnell Lose, MD  furosemide (LASIX) 80 MG tablet Take 80 mg by mouth daily.   Yes Historical Provider, MD  nitroGLYCERIN (NITROSTAT) 0.4 MG SL tablet Place 0.4 mg under the tongue every 5 (five) minutes as needed for chest pain.   Yes Historical Provider, MD  potassium chloride SA (K-DUR,KLOR-CON) 20 MEQ tablet Take 20 mEq by mouth every morning.    Yes Historical Provider, MD  spironolactone (ALDACTONE) 50 MG tablet Take 1 tablet (50 mg total) by mouth daily. Patient taking differently: Take 50 mg by mouth 2 (two) times daily.  01/02/16  Yes Thurnell Lose, MD  thiamine 100 MG tablet Take 1 tablet (100 mg total) by mouth daily. 01/02/16  Yes Thurnell Lose, MD  traMADol (ULTRAM) 50 MG tablet Take 50 mg by mouth every 6 (six) hours as needed (for pain).   Yes Historical Provider, MD  lactulose (CHRONULAC) 10 GM/15ML solution Take 30 mLs (20 g total) by mouth daily. Patient not taking: Reported on 03/29/2017 01/26/16   Florencia Reasons, MD  omeprazole (PRILOSEC) 40 MG capsule Take 1 capsule (40 mg total) by mouth every evening. Patient not taking: Reported on 03/29/2017 01/02/16   Thurnell Lose, MD  ranitidine (ZANTAC) 150 MG tablet Take 150 mg by mouth 2 (two) times daily.    Historical Provider, MD  rifaximin (XIFAXAN) 550 MG TABS tablet Take 1 tablet (550 mg total) by mouth 2 (two) times daily. Patient not taking: Reported on 03/29/2017 01/26/16   Florencia Reasons, MD    Family History Family History  Problem Relation Age of Onset  . Liver cancer Mother   . Diabetes Mellitus II Sister     Social History Social History  Substance Use Topics  . Smoking status: Former Smoker    Packs/day: 2.00    Years: 40.00    Types: Cigarettes    Quit date: 08/14/2008  . Smokeless tobacco: Never Used  . Alcohol use 0.0 oz/week     Comment: 1 pint whiskey daily several beer weekly    01/2016  " no alcohol in 3 weeks "     Allergies   Penicillins   Review of  Systems Review of Systems  Constitutional: Negative for chills and fever.  HENT: Negative for ear pain, nosebleeds and sore throat.   Eyes: Negative for pain and visual disturbance.  Respiratory: Negative for cough and shortness of breath.   Cardiovascular: Negative for chest pain and palpitations.  Gastrointestinal: Positive for abdominal pain, blood in stool and hematochezia. Negative for hematemesis and vomiting.  Genitourinary: Negative for dysuria and hematuria.  Musculoskeletal: Negative for arthralgias and back pain.  Skin: Negative for color change and rash.  Neurological: Negative for dizziness, seizures, loss of consciousness, syncope and light-headedness.  All other systems reviewed and are negative.    Physical Exam Updated Vital Signs BP (!) 110/51   Pulse (!) 103   Temp 98.2 F (36.8 C) (Oral)  Resp 16   Ht 5' 11.5" (1.816 m)   Wt 92.5 kg   SpO2 99%   BMI 28.06 kg/m   Physical Exam  Constitutional: He appears well-developed and well-nourished.  HENT:  Head: Normocephalic and atraumatic.  Eyes: Conjunctivae and EOM are normal.  Neck: Neck supple.  Cardiovascular: Normal rate, regular rhythm and intact distal pulses.   No murmur heard. Pulmonary/Chest: Effort normal and breath sounds normal. No respiratory distress.  Abdominal: Soft. There is tenderness in the epigastric area.  Genitourinary: Rectal exam shows guaiac positive stool. Rectal exam shows no external hemorrhoid, no internal hemorrhoid, no mass and no tenderness.  Musculoskeletal: He exhibits no edema.  Neurological: He is alert. GCS eye subscore is 4. GCS verbal subscore is 5. GCS motor subscore is 6.  Skin: Skin is warm and dry.  Psychiatric: He has a normal mood and affect.  Nursing note and vitals reviewed.    ED Treatments / Results  Labs (all labs ordered are listed, but only abnormal results are displayed) Labs Reviewed  COMPREHENSIVE METABOLIC PANEL - Abnormal; Notable for the  following:       Result Value   Potassium 6.1 (*)    Glucose, Bld 154 (*)    BUN 29 (*)    Total Protein 6.0 (*)    Albumin 3.4 (*)    Total Bilirubin 1.3 (*)    All other components within normal limits  CBC - Abnormal; Notable for the following:    WBC 13.6 (*)    RBC 3.99 (*)    Hemoglobin 12.4 (*)    HCT 36.7 (*)    Platelets 130 (*)    All other components within normal limits  PROTIME-INR - Abnormal; Notable for the following:    Prothrombin Time 16.7 (*)    All other components within normal limits  CBC WITH DIFFERENTIAL/PLATELET - Abnormal; Notable for the following:    WBC 14.6 (*)    RBC 3.22 (*)    Hemoglobin 9.9 (*)    HCT 29.5 (*)    Neutro Abs 10.4 (*)    Monocytes Absolute 1.8 (*)    All other components within normal limits  BASIC METABOLIC PANEL - Abnormal; Notable for the following:    CO2 19 (*)    Glucose, Bld 149 (*)    BUN 34 (*)    Calcium 8.4 (*)    All other components within normal limits  POC OCCULT BLOOD, ED - Abnormal; Notable for the following:    Fecal Occult Bld POSITIVE (*)    All other components within normal limits  I-STAT CG4 LACTIC ACID, ED - Abnormal; Notable for the following:    Lactic Acid, Venous 1.91 (*)    All other components within normal limits  CBG MONITORING, ED - Abnormal; Notable for the following:    Glucose-Capillary 134 (*)    All other components within normal limits  LIPASE, BLOOD  COMPREHENSIVE METABOLIC PANEL  CBC  I-STAT CG4 LACTIC ACID, ED  TYPE AND SCREEN    EKG  EKG Interpretation None       Radiology No results found.  Procedures Procedures (including critical care time)  Medications Ordered in ED Medications  insulin aspart (novoLOG) injection 10 Units (not administered)  sodium chloride flush (NS) 0.9 % injection 3 mL (3 mLs Intravenous Given 03/29/17 2212)  0.9 %  sodium chloride infusion ( Intravenous Transfusing/Transfer 03/29/17 2303)  acetaminophen (TYLENOL) tablet 650 mg (not  administered)    Or  acetaminophen (TYLENOL) suppository 650 mg (not administered)  ondansetron (ZOFRAN) tablet 4 mg (not administered)    Or  ondansetron (ZOFRAN) injection 4 mg (not administered)  pantoprazole (PROTONIX) injection 40 mg (not administered)  pantoprazole (PROTONIX) injection 40 mg (40 mg Intravenous Given 03/29/17 1939)  sodium chloride 0.9 % bolus 1,000 mL (0 mLs Intravenous Stopped 03/29/17 2205)  dextrose 10 % infusion ( Intravenous Transfusing/Transfer 03/29/17 2303)     Initial Impression / Assessment and Plan / ED Course  I have reviewed the triage vital signs and the nursing notes.  Pertinent labs & imaging results that were available during my care of the patient were reviewed by me and considered in my medical decision making (see chart for details).     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 Patient reports today he had some feelings of indigestion which she reports were similar to a year ago when he had GI bleed. He reports after this he had a dark stool with some bright red blood. For this he came to the emergency..  On arrival patient was hemodynamic stable and afebrile. Type and cross obtained. Hemoglobin not significantly decreased. Vital signs within normal limits. Patient with some mild epigastric tenderness to palpation. Rectal exam revealed melanotic stool with bright red blood. Have concern for GI bleed and per topics were given. Discussed case with equal gastroenterology who reports they will see patient in the morning for likely need for endoscopy. They were negative with Protonix and nothing by mouth at midnight. Patient's labs additionally revealed slight hyperkalemia. Patient given insulin and glucose. No significant EKG changes noted.  On reassessment heart rate was increasing and patient given IV fluids. Patient did not have  recurrent bouts of bright red blood while in emergency department. Repeat hemoglobin did show decrease in hemoglobin level but at this time patient does not require transfusion. We'll continue to monitor.  Patient will be admitted to medicine for further management as condition was stable at time of transfer of care.  Final Clinical Impressions(s) / ED Diagnoses   Final diagnoses:  Rectal bleeding  Cough    New Prescriptions New Prescriptions   No medications on file     Esaw Grandchild, MD 03/29/17 [place], MD 04/03/17 [phone]

## 2017-03-29 NOTE — ED Notes (Signed)
Patient transported to X-ray 

## 2017-03-29 NOTE — ED Notes (Signed)
MD at bedside. 

## 2017-03-30 ENCOUNTER — Inpatient Hospital Stay (HOSPITAL_COMMUNITY): Payer: Medicare Other

## 2017-03-30 ENCOUNTER — Encounter (HOSPITAL_COMMUNITY): Payer: Self-pay | Admitting: Gastroenterology

## 2017-03-30 ENCOUNTER — Encounter (HOSPITAL_COMMUNITY)
Admission: EM | Disposition: A | Discharge status: Home / Self Care | Payer: Self-pay | Source: Home / Self Care | Attending: Pulmonary Disease

## 2017-03-30 DIAGNOSIS — E875 Hyperkalemia: Secondary | ICD-10-CM

## 2017-03-30 DIAGNOSIS — F1021 Alcohol dependence, in remission: Secondary | ICD-10-CM

## 2017-03-30 DIAGNOSIS — Z880 Allergy status to penicillin: Secondary | ICD-10-CM

## 2017-03-30 DIAGNOSIS — Z8739 Personal history of other diseases of the musculoskeletal system and connective tissue: Secondary | ICD-10-CM

## 2017-03-30 DIAGNOSIS — I5032 Chronic diastolic (congestive) heart failure: Secondary | ICD-10-CM

## 2017-03-30 DIAGNOSIS — Z833 Family history of diabetes mellitus: Secondary | ICD-10-CM

## 2017-03-30 DIAGNOSIS — D72829 Elevated white blood cell count, unspecified: Secondary | ICD-10-CM

## 2017-03-30 DIAGNOSIS — Z87891 Personal history of nicotine dependence: Secondary | ICD-10-CM

## 2017-03-30 DIAGNOSIS — Z8719 Personal history of other diseases of the digestive system: Secondary | ICD-10-CM

## 2017-03-30 HISTORY — PX: ESOPHAGOGASTRODUODENOSCOPY: SHX5428

## 2017-03-30 LAB — PREPARE RBC (CROSSMATCH)

## 2017-03-30 LAB — CBC
HEMATOCRIT: 30 % — AB (ref 39.0–52.0)
Hemoglobin: 10 g/dL — ABNORMAL LOW (ref 13.0–17.0)
MCH: 30.8 pg (ref 26.0–34.0)
MCHC: 33.3 g/dL (ref 30.0–36.0)
MCV: 92.3 fL (ref 78.0–100.0)
PLATELETS: 128 10*3/uL — AB (ref 150–400)
RBC: 3.25 MIL/uL — ABNORMAL LOW (ref 4.22–5.81)
RDW: 14.3 % (ref 11.5–15.5)
WBC: 18.3 10*3/uL — ABNORMAL HIGH (ref 4.0–10.5)

## 2017-03-30 LAB — TROPONIN I: TROPONIN I: 0.06 ng/mL — AB (ref <0.03)

## 2017-03-30 LAB — HEMOGLOBIN AND HEMATOCRIT, BLOOD
HCT: 23 % — ABNORMAL LOW (ref 39.0–52.0)
HCT: 21.3 % — ABNORMAL LOW (ref 39.0–52.0)
HCT: 26.1 % — ABNORMAL LOW (ref 39.0–52.0)
HEMOGLOBIN: 7.3 g/dL — AB (ref 13.0–17.0)
Hemoglobin: 7.8 g/dL — ABNORMAL LOW (ref 13.0–17.0)
Hemoglobin: 8.9 g/dL — ABNORMAL LOW (ref 13.0–17.0)

## 2017-03-30 LAB — COMPREHENSIVE METABOLIC PANEL
ALBUMIN: 3 g/dL — AB (ref 3.5–5.0)
ALT: 23 U/L (ref 17–63)
ANION GAP: 7 (ref 5–15)
AST: 33 U/L (ref 15–41)
Alkaline Phosphatase: 70 U/L (ref 38–126)
BILIRUBIN TOTAL: 1.1 mg/dL (ref 0.3–1.2)
BUN: 29 mg/dL — AB (ref 6–20)
CHLORIDE: 109 mmol/L (ref 101–111)
CO2: 19 mmol/L — ABNORMAL LOW (ref 22–32)
Calcium: 8.3 mg/dL — ABNORMAL LOW (ref 8.9–10.3)
Creatinine, Ser: 0.93 mg/dL (ref 0.61–1.24)
GFR calc Af Amer: >60 mL/min (ref >60)
GFR calc non Af Amer: >60 mL/min (ref >60)
GLUCOSE: 153 mg/dL — AB (ref 65–99)
POTASSIUM: 4.4 mmol/L (ref 3.5–5.1)
SODIUM: 135 mmol/L (ref 135–145)
TOTAL PROTEIN: 5 g/dL — AB (ref 6.5–8.1)

## 2017-03-30 LAB — MRSA PCR SCREENING: MRSA BY PCR: NEGATIVE

## 2017-03-30 LAB — FIBRINOGEN: FIBRINOGEN: 206 mg/dL — AB (ref 210–475)

## 2017-03-30 SURGERY — EGD (ESOPHAGOGASTRODUODENOSCOPY)
Anesthesia: Moderate Sedation

## 2017-03-30 MED ORDER — POLYETHYLENE GLYCOL 3350 17 G PO PACK: 17.0000 g | PACK | Freq: Three times a day (TID) | ORAL | Status: DC

## 2017-03-30 MED ORDER — IOPAMIDOL (ISOVUE-370) INJECTION 76%
INTRAVENOUS | Status: AC
  Administered 2017-03-30: 100 mL
  Filled 2017-03-30: qty 100

## 2017-03-30 MED ORDER — MIDAZOLAM HCL 5 MG/ML IJ SOLN
INTRAMUSCULAR | Status: AC
  Filled 2017-03-30: qty 2

## 2017-03-30 MED ORDER — BUTAMBEN-TETRACAINE-BENZOCAINE 2-2-14 % EX AERO
INHALATION_SPRAY | CUTANEOUS | Status: DC | PRN
  Administered 2017-03-30: 2 via TOPICAL

## 2017-03-30 MED ORDER — NITROGLYCERIN 0.4 MG SL SUBL
SUBLINGUAL_TABLET | SUBLINGUAL | Status: AC
  Filled 2017-03-30: qty 1

## 2017-03-30 MED ORDER — SODIUM CHLORIDE 0.9 % IV BOLUS (SEPSIS)
1000.0000 mL | Freq: Once | INTRAVENOUS | Status: AC
  Administered 2017-03-30: 1000 mL via INTRAVENOUS

## 2017-03-30 MED ORDER — FENTANYL CITRATE (PF) 100 MCG/2ML IJ SOLN
INTRAMUSCULAR | Status: DC | PRN
  Administered 2017-03-30: 25 ug via INTRAVENOUS

## 2017-03-30 MED ORDER — TECHNETIUM TC 99M-LABELED RED BLOOD CELLS IV KIT
26.4000 | PACK | Freq: Once | INTRAVENOUS | Status: AC | PRN
  Administered 2017-03-30: 26.4 via INTRAVENOUS

## 2017-03-30 MED ORDER — MIDAZOLAM HCL 10 MG/2ML IJ SOLN
INTRAMUSCULAR | Status: DC | PRN
  Administered 2017-03-30: 2 mg via INTRAVENOUS

## 2017-03-30 MED ORDER — ORAL CARE MOUTH RINSE: 15.0000 mL | Freq: Two times a day (BID) | OROMUCOSAL | Status: DC

## 2017-03-30 MED ORDER — SODIUM CHLORIDE 0.9 % IV SOLN: Freq: Once | INTRAVENOUS | Status: DC

## 2017-03-30 MED ORDER — SODIUM CHLORIDE 0.9 % IV SOLN
50.0000 ug/h | INTRAVENOUS | Status: DC
  Administered 2017-03-30 – 2017-04-03 (×9): 50 ug/h via INTRAVENOUS
  Filled 2017-03-30 (×15): qty 1

## 2017-03-30 MED ORDER — SODIUM CHLORIDE 0.9 % IV SOLN
INTRAVENOUS | Status: DC
  Administered 2017-03-30: 20:00:00 via INTRAVENOUS

## 2017-03-30 MED ORDER — CEFTRIAXONE SODIUM 2 G IJ SOLR
2.0000 g | Freq: Every day | INTRAMUSCULAR | Status: DC
  Administered 2017-03-30 – 2017-04-02 (×4): 2 g via INTRAVENOUS
  Filled 2017-03-30 (×4): qty 2

## 2017-03-30 MED ORDER — EPINEPHRINE PF 1 MG/10ML IJ SOSY
PREFILLED_SYRINGE | INTRAMUSCULAR | Status: AC
  Filled 2017-03-30: qty 20

## 2017-03-30 MED ORDER — FAMOTIDINE IN NACL 20-0.9 MG/50ML-% IV SOLN
20.0000 mg | Freq: Once | INTRAVENOUS | Status: AC
  Administered 2017-03-30: 20 mg via INTRAVENOUS
  Filled 2017-03-30: qty 50

## 2017-03-30 MED ORDER — SODIUM CHLORIDE 0.9 % IV SOLN: Freq: Once | INTRAVENOUS | Status: AC

## 2017-03-30 MED ORDER — FENTANYL CITRATE (PF) 100 MCG/2ML IJ SOLN
INTRAMUSCULAR | Status: AC
  Filled 2017-03-30: qty 4

## 2017-03-30 NOTE — Progress Notes (Signed)
CRITICAL VALUE ALERT  Critical value received:  troponin 0.06  Date of notification:  03/30/17  Time of notification:  2107   Critical value read back:  Yes.    Nurse who received alert:  Legrand Pitts RN  MD notified: Dr. Wynetta Emery  Time of first page:  2109  Time MD responded:  2111  Cont. trend troponins, pt continues to have no chest pain/pressure/discomfort at this time.

## 2017-03-30 NOTE — Progress Notes (Signed)
Patient had a large bloody liquid BM on bedside commode approximately 200 ml's.

## 2017-03-30 NOTE — H&P (View-Only) (Signed)
[facility] CONSULT Reason for consult: G.I. bleeding Referring Physician: Internal Medicine Teaching Service. ECP: Dr. Arelia Sneddon. Primary G.I.: Dr. [name] is an 66 y.o. male.  HPI: he has been followed by Dr. Michail Sermon for some time and carries a diagnosis of portal gastropathy and mild cirrhosis. The etiology of his cirrhosis is felt to be alcohol. He's had ultrasound showing no ascites mild irregularity of liver compatible with mild cirrhosis. He said several EGD's with duodenal AVMs being cauterized with question of small varices in the past that were not really prominent and on some EGD's were questioned if they were even real. He has had gastropathy that was felt to be portal gastropathy. This was when he was drinking. The patient reports that he has had no alcohol in one year, 1 month, in several days. He's not having G.I. bleeding or any other symptoms. He has been given lactulose by Dr. Michail Sermon. He had colonoscopy with polyp removal 2016 and at the time that procedure was noted to have extensive diverticulosis of the left colon. The patient takes a lactulose intermittently. He has had reflux in the past and is taken some PPI intermittently. He has had no reflux or abdominal pain recently. He had some heartburn yesterday morning and had a large bowel movement with clots rapidly returned his bright red. He came rapidly to the emergency room. He continued to pass bright red blood with bowel movements. His labs were notable for hemoglobin 12.4 is only dropped to 9.9. Platelet count 130 INR 16.7. Notably his BUN is only 29. The patient has gotten IV PPI having no further heartburn. He's had no nausea vomiting or hematemesis. He's continued to pass bright red blood with bowel movements and has been slightly hypotensive pressure has now come up to approximately 90/55. He denies much wave abdominal pain other than some cramping. I discussed with the resident during the night and we  have him on the schedule for G.I. bleeding scan hopefully early a.m. today.  Past Medical History:  Diagnosis Date  . Alcohol abuse   . Anemia   . AVM (arteriovenous malformation) of colon    a. By colonoscopy 07/2015.  Marland Kitchen Cirrhosis of liver ([place])   . Colon polyps   . COPD (chronic obstructive pulmonary disease) ([place])   . Diastolic CHF ([place])   . GERD (gastroesophageal reflux disease)   . GI bleed 01/2016  . Hypertension   . Hypokalemia   . Internal hemorrhoids   . Portal hypertensive gastropathy ([place])    a. By EGD 07/2015.  . Sigmoid diverticulosis   . SVT (supraventricular tachycardia) ([place])    a. 07/2015 in setting of severe hypokalemia.  . Symptomatic anemia    a. 07/2015 - felt multifactorial from cirrhosis and AVMs.  . Thrombocytopenia ([place])   . Tobacco use     Past Surgical History:  Procedure Laterality Date  . CATARACT EXTRACTION    . COLONOSCOPY N/A 07/15/2015   Procedure: COLONOSCOPY;  Surgeon: Wilford Corner, MD;  Location: [facility] ENDOSCOPY;  Service: Endoscopy;  Laterality: N/A;  . ESOPHAGOGASTRODUODENOSCOPY N/A 07/15/2015   Procedure: ESOPHAGOGASTRODUODENOSCOPY (EGD);  Surgeon: Wilford Corner, MD;  Location[facility] ENDOSCOPY;  Service: Endoscopy;  Laterality: N/A;  . ESOPHAGOGASTRODUODENOSCOPY N/A 12/30/2015   Procedure: ESOPHAGOGASTRODUODENOSCOPY (EGD);  Surgeon: Ronald Lobo, MD;  Location: [facility] ENDOSCOPY;  Service: Endoscopy;  Laterality: N/A;  . ESOPHAGOGASTRODUODENOSCOPY (EGD) WITH PROPOFOL N/A 01/24/2016   Procedure: ESOPHAGOGASTRODUODENOSCOPY (EGD) WITH PROPOFOL;  Surgeon: Wonda Horner, MD;  Location: [facility]  ENDOSCOPY;  Service: Endoscopy;  Laterality: N/A;  . HERNIA REPAIR    . HOT HEMOSTASIS N/A 01/24/2016   Procedure: HOT HEMOSTASIS (ARGON PLASMA COAGULATION/BICAP);  Surgeon: Wonda Horner, MD;  Location: [facility] ENDOSCOPY;  Service: Endoscopy;  Laterality: N/A;    Family History  Problem Relation Age of Onset  . Liver cancer Mother   . Diabetes Mellitus II Sister      Social History:  reports that he quit smoking about 8 years ago. His smoking use included Cigarettes. He has a 80.00 pack-year smoking history. He has never used smokeless tobacco. He reports that he drinks alcohol. He reports that he does not use drugs.  Allergies:  Allergies  Allergen Reactions  . Penicillins Rash    Has patient had a PCN reaction causing immediate rash, facial/tongue/throat swelling, SOB or lightheadedness with hypotension: Yes Has patient had a PCN reaction causing severe rash involving mucus membranes or skin necrosis: No Has patient had a PCN reaction that required hospitalization: No Has patient had a PCN reaction occurring within the last 10 years: No If all of the above answers are "NO", then may proceed with Cephalosporin use.     Medications; Prior to Admission medications   Medication Sig Start Date End Date Taking? Authorizing Provider  acetaminophen (TYLENOL) 500 MG tablet Take 500-1,000 mg by mouth every 8 (eight) hours as needed (for pain).   Yes Historical Provider, MD  ferrous sulfate 325 (65 FE) MG tablet Take 1 tablet (325 mg total) by mouth 3 (three) times daily with meals. Patient taking differently: Take 325 mg by mouth every evening.  07/17/15  Yes Dayna N Dunn, PA-C  folic acid (FOLVITE) 1 MG tablet Take 1 tablet (1 mg total) by mouth daily. 01/02/16  Yes Thurnell Lose, MD  furosemide (LASIX) 80 MG tablet Take 80 mg by mouth daily.   Yes Historical Provider, MD  nitroGLYCERIN (NITROSTAT) 0.4 MG SL tablet Place 0.4 mg under the tongue every 5 (five) minutes as needed for chest pain.   Yes Historical Provider, MD  potassium chloride SA (K-DUR,KLOR-CON) 20 MEQ tablet Take 20 mEq by mouth every morning.    Yes Historical Provider, MD  spironolactone (ALDACTONE) 50 MG tablet Take 1 tablet (50 mg total) by mouth daily. Patient taking differently: Take 50 mg by mouth 2 (two) times daily.  01/02/16  Yes Thurnell Lose, MD  thiamine 100 MG tablet  Take 1 tablet (100 mg total) by mouth daily. 01/02/16  Yes Thurnell Lose, MD  traMADol (ULTRAM) 50 MG tablet Take 50 mg by mouth every 6 (six) hours as needed (for pain).   Yes Historical Provider, MD  lactulose (CHRONULAC) 10 GM/15ML solution Take 30 mLs (20 g total) by mouth daily. Patient not taking: Reported on 03/29/2017 01/26/16   Florencia Reasons, MD  omeprazole (PRILOSEC) 40 MG capsule Take 1 capsule (40 mg total) by mouth every evening. Patient not taking: Reported on 03/29/2017 01/02/16   Thurnell Lose, MD  ranitidine (ZANTAC) 150 MG tablet Take 150 mg by mouth 2 (two) times daily.    Historical Provider, MD  rifaximin (XIFAXAN) 550 MG TABS tablet Take 1 tablet (550 mg total) by mouth 2 (two) times daily. Patient not taking: Reported on 03/29/2017 01/26/16   Florencia Reasons, MD   . pantoprazole (PROTONIX) IV  40 mg Intravenous Q12H  . sodium chloride flush  3 mL Intravenous Q12H   PRN Meds acetaminophen **OR** acetaminophen, ondansetron **OR** ondansetron (ZOFRAN) IV Results for  orders placed or performed during the hospital encounter of 03/29/17 (from the past 48 hour(s))  Comprehensive metabolic panel     Status: Abnormal   Collection Time: 03/29/17  4:34 PM  Result Value Ref Range   Sodium 135 135 - 145 mmol/Warren   Potassium 6.1 (H) 3.5 - 5.1 mmol/Warren   Chloride 107 101 - 111 mmol/Warren   CO2 22 22 - 32 mmol/Warren   Glucose, Bld 154 (H) 65 - 99 mg/dL   BUN 29 (H) 6 - 20 mg/dL   Creatinine, Ser 0.92 0.61 - 1.24 mg/dL   Calcium 9.2 8.9 - 10.3 mg/dL   Total Protein 6.0 (Warren) 6.5 - 8.1 g/dL   Albumin 3.4 (Warren) 3.5 - 5.0 g/dL   AST 32 15 - 41 U/Warren   ALT 27 17 - 63 U/Warren   Alkaline Phosphatase 86 38 - 126 U/Warren   Total Bilirubin 1.3 (H) 0.3 - 1.2 mg/dL   GFR calc non Af Amer >60 >60 mL/min   GFR calc Af Amer >60 >60 mL/min    Comment: (NOTE) The eGFR has been calculated using the CKD EPI equation. This calculation has not been validated in all clinical situations. eGFR's persistently <60 mL/min signify possible  Chronic Kidney Disease.    Anion gap 6 5 - 15  CBC     Status: Abnormal   Collection Time: 03/29/17  4:34 PM  Result Value Ref Range   WBC 13.6 (H) 4.0 - 10.5 K/uL   RBC 3.99 (Warren) 4.22 - 5.81 MIL/uL   Hemoglobin 12.4 (Warren) 13.0 - 17.0 g/dL   HCT 36.7 (Warren) 39.0 - 52.0 %   MCV 92.0 78.0 - 100.0 fL   MCH 31.1 26.0 - 34.0 pg   MCHC 33.8 30.0 - 36.0 g/dL   RDW 13.9 11.5 - 15.5 %   Platelets 130 (Warren) 150 - 400 K/uL  Type and screen [place]     Status: None (Preliminary result)   Collection Time: 03/29/17  4:35 PM  Result Value Ref Range   ABO/RH(D) O POS    Antibody Screen NEG    Sample Expiration 04/01/2017    Unit Number [id number]    Blood Component Type RED CELLS,LR    Unit division 00    Status of Unit ISSUED    Transfusion Status OK TO TRANSFUSE    Crossmatch Result Compatible   Lipase, blood     Status: None   Collection Time: 03/29/17  6:55 PM  Result Value Ref Range   Lipase 31 11 - 51 U/Warren  Protime-INR     Status: Abnormal   Collection Time: 03/29/17  6:55 PM  Result Value Ref Range   Prothrombin Time 16.7 (H) 11.4 - 15.2 seconds   INR 1.34   POC occult blood, ED     Status: Abnormal   Collection Time: 03/29/17  6:58 PM  Result Value Ref Range   Fecal Occult Bld POSITIVE (A) NEGATIVE  CBG monitoring, ED     Status: Abnormal   Collection Time: 03/29/17  9:02 PM  Result Value Ref Range   Glucose-Capillary 134 (H) 65 - 99 mg/dL  CBC with Differential     Status: Abnormal   Collection Time: 03/29/17 10:08 PM  Result Value Ref Range   WBC 14.6 (H) 4.0 - 10.5 K/uL   RBC 3.22 (Warren) 4.22 - 5.81 MIL/uL   Hemoglobin 9.9 (Warren) 13.0 - 17.0 g/dL    Comment: REPEATED TO VERIFY   HCT 29.5 (  Warren) 39.0 - 52.0 %   MCV 91.6 78.0 - 100.0 fL   MCH 30.7 26.0 - 34.0 pg   MCHC 33.6 30.0 - 36.0 g/dL   RDW 14.0 11.5 - 15.5 %   Platelets 112 (Warren) 150 - 400 K/uL    Comment: PLATELET COUNT CONFIRMED BY SMEAR   Neutrophils Relative % 71 %   Neutro Abs 10.4 (H) 1.7 - 7.7  K/uL   Lymphocytes Relative 16 %   Lymphs Abs 2.3 0.7 - 4.0 K/uL   Monocytes Relative 13 %   Monocytes Absolute 1.8 (H) 0.1 - 1.0 K/uL   Eosinophils Relative 1 %   Eosinophils Absolute 0.1 0.0 - 0.7 K/uL   Basophils Relative 0 %   Basophils Absolute 0.0 0.0 - 0.1 K/uL  Basic metabolic panel     Status: Abnormal   Collection Time: 03/29/17 10:08 PM  Result Value Ref Range   Sodium 135 135 - 145 mmol/Warren   Potassium 5.0 3.5 - 5.1 mmol/Warren    Comment: DELTA CHECK NOTED   Chloride 107 101 - 111 mmol/Warren   CO2 19 (Warren) 22 - 32 mmol/Warren   Glucose, Bld 149 (H) 65 - 99 mg/dL   BUN 34 (H) 6 - 20 mg/dL   Creatinine, Ser 0.91 0.61 - 1.24 mg/dL   Calcium 8.4 (Warren) 8.9 - 10.3 mg/dL   GFR calc non Af Amer >60 >60 mL/min   GFR calc Af Amer >60 >60 mL/min    Comment: (NOTE) The eGFR has been calculated using the CKD EPI equation. This calculation has not been validated in all clinical situations. eGFR's persistently <60 mL/min signify possible Chronic Kidney Disease.    Anion gap 9 5 - 15  I-Stat CG4 Lactic Acid, ED     Status: Abnormal   Collection Time: 03/29/17 10:14 PM  Result Value Ref Range   Lactic Acid, Venous 1.91 (HH) 0.5 - 1.9 mmol/Warren  MRSA PCR Screening     Status: None   Collection Time: 03/29/17 11:11 PM  Result Value Ref Range   MRSA by PCR NEGATIVE NEGATIVE    Comment:        The GeneXpert MRSA Assay (FDA approved for NASAL specimens only), is one component of a comprehensive MRSA colonization surveillance program. It is not intended to diagnose MRSA infection nor to guide or monitor treatment for MRSA infections.   CBC     Status: Abnormal   Collection Time: 03/30/17  2:28 AM  Result Value Ref Range   WBC 18.3 (H) 4.0 - 10.5 K/uL   RBC 3.25 (Warren) 4.22 - 5.81 MIL/uL   Hemoglobin 10.0 (Warren) 13.0 - 17.0 g/dL   HCT 30.0 (Warren) 39.0 - 52.0 %   MCV 92.3 78.0 - 100.0 fL   MCH 30.8 26.0 - 34.0 pg   MCHC 33.3 30.0 - 36.0 g/dL   RDW 14.3 11.5 - 15.5 %   Platelets 128 (Warren) 150 - 400 K/uL   Comprehensive metabolic panel     Status: Abnormal   Collection Time: 03/30/17  2:28 AM  Result Value Ref Range   Sodium 135 135 - 145 mmol/Warren   Potassium 4.4 3.5 - 5.1 mmol/Warren   Chloride 109 101 - 111 mmol/Warren   CO2 19 (Warren) 22 - 32 mmol/Warren   Glucose, Bld 153 (H) 65 - 99 mg/dL   BUN 29 (H) 6 - 20 mg/dL   Creatinine, Ser 0.93 0.61 - 1.24 mg/dL   Calcium 8.3 (Warren) 8.9 - 10.3 mg/dL  Total Protein 5.0 (Warren) 6.5 - 8.1 g/dL   Albumin 3.0 (Warren) 3.5 - 5.0 g/dL   AST 33 15 - 41 U/Warren   ALT 23 17 - 63 U/Warren   Alkaline Phosphatase 70 38 - 126 U/Warren   Total Bilirubin 1.1 0.3 - 1.2 mg/dL   GFR calc non Af Amer >60 >60 mL/min   GFR calc Af Amer >60 >60 mL/min    Comment: (NOTE) The eGFR has been calculated using the CKD EPI equation. This calculation has not been validated in all clinical situations. eGFR's persistently <60 mL/min signify possible Chronic Kidney Disease.    Anion gap 7 5 - 15  Prepare RBC     Status: None   Collection Time: 03/30/17  5:41 AM  Result Value Ref Range   Order Confirmation ORDER PROCESSED BY BLOOD BANK     Dg Chest 2 View  Result Date: 03/29/2017 CLINICAL DATA:  66 year old male with cough. EXAM: CHEST  2 VIEW COMPARISON:  Chest radiograph dated 12/29/2015 FINDINGS: There is emphysematous changes of the lungs with mild interstitial coarsening. No focal consolidation, pleural effusion, or pneumothorax. The cardiac silhouette is within normal limits. There is degenerative changes of the spine. No acute osseous pathology. IMPRESSION: No active cardiopulmonary disease. COPD. Electronically Signed   By: Anner Crete M.D.   On: 03/29/2017 23:38               Blood pressure (!) 86/56, pulse (!) 113, temperature 97.6 F (36.4 C), temperature source Oral, resp. rate 14, height _0  (1.803 m), weight 90.9 kg (200 lb 6.4 oz), SpO2 100 %.  Physical exam:   General--Alert white male in no distress ENT-- nonicteric, oral mucous membranes quite dry Neck-- supple Heart--  tachycardic Lungs-- clear Abdomen-- completely nontender in the epigastrium. Bowel sounds normal. Rectal-- not perform patient had bowel movement in bedpan maroonish to bright red Psych-- alert oriented and answers questions appropriately   Assessment: 1. G.I. bleed. This is very consistent with a lower G.I. bleed. He does have some history of liver disease that is never had significant if any varices on multiple EGD's in the past and his stop drinking completely for the past year or so. As BUN is barely above normal in the face of continued ongoing bleeding. Colonoscopy a couple years ago showed extensive diverticulosis of the left colon and I suspect that this is a diverticular bleeding. 2. History of liver disease. This apparently was fairly minimal felt to be due to alcohol. The patient is subsequently quit drinking in his LFTs were fairly normal on admission. His PT was elevated a little and in the face of active bleeding think we should give him some FFP  Plan: 1. I will increases IV fluid rate. He has been only on hundred and 50 mL/hr while NPO and actively bleeding. I will go ahead and increase this to 250 mL per hour until his blood pressure stabilizes. We will also give him a couple units of FFP. 2. Would go ahead and get G.I. bleeding scan the soonest possible this morning. The patient appears to have LGI bleed probably diverticular disease and if this continues he may need an angiogram if bleeding in the left colon is documented on the G.I. bleeding scan. If there is no documented bleeding and he continues to bleed, we will perform EGD just to make certain there is no upper G.I. source. With no other upper G.I. symptoms in the face of minimal elevation of BUN, UGI  bleeding is much less likely. Would still continue with empiric IV PPI. We will keep NPO in case EGD is needed.   John Warren,John Warren 03/30/2017, 7:14 AM   This note was created using voice recognition software and minor errors  may Have occurred unintentionally. Pager: [phone] If no answer or after hours call [phone]

## 2017-03-30 NOTE — Interval H&P Note (Signed)
History and Physical Interval Note:  03/30/2017 1:50 PM  John Warren  has presented today for surgery, with the diagnosis of GI bleed  The various methods of treatment have been discussed with the patient and family. After consideration of risks, benefits and other options for treatment, the patient has consented to  Procedure(s): ESOPHAGOGASTRODUODENOSCOPY (EGD) (N/A) as a surgical intervention .  The patient's history has been reviewed, patient examined, no change in status, stable for surgery.  I have reviewed the patient's chart and labs.  Questions were answered to the patient's satisfaction.     Aurilla Coulibaly JR,Christella App L

## 2017-03-30 NOTE — Consult Note (Signed)
Chief Complaint: Patient was seen in consultation today for bloody stools  Referring Physician(s):  Dr. Laurence Spates  Supervising Physician: Markus Daft  Patient Status: [facility] - In-pt  History of Present Illness: John Warren is a 66 y.o. male with past medical history of GERD, COPD, HTN, AMVs of the colon, sigmoid diverticulosis, alcohol abuse and cirrhosis of the liver presents with BRBPR.  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   IR consulted for angiogram with possible embolization.  Dr. Anselm Pancoast has reviewed case.   Currently NPO.  On IVF for hypotension.   Past Medical History:  Diagnosis Date  . Alcohol abuse   . Anemia   . AVM (arteriovenous malformation) of colon    a. By colonoscopy 07/2015.  Marland Kitchen Cirrhosis of liver ([place])   . Colon polyps   . COPD (chronic obstructive pulmonary disease) ([place])   . Diastolic CHF ([place])   . GERD (gastroesophageal reflux disease)   . GI bleed 01/2016  . Hypertension   . Hypokalemia   . Internal hemorrhoids   . Portal hypertensive gastropathy ([place])    a. By EGD 07/2015.  . Sigmoid diverticulosis   . SVT (supraventricular tachycardia) ([place])    a. 07/2015 in setting of severe hypokalemia.  . Symptomatic anemia    a. 07/2015 - felt multifactorial from cirrhosis and AVMs.  . Thrombocytopenia ([place])   . Tobacco use     Past Surgical History:  Procedure Laterality Date  . CATARACT EXTRACTION    . COLONOSCOPY N/A 07/15/2015   Procedure: COLONOSCOPY;  Surgeon: Wilford Corner, MD;  Location: [facility] ENDOSCOPY;  Service: Endoscopy;  Laterality: N/A;  . ESOPHAGOGASTRODUODENOSCOPY N/A 07/15/2015   Procedure: ESOPHAGOGASTRODUODENOSCOPY (EGD);  Surgeon: Wilford Corner, MD;  Location: [facility] ENDOSCOPY;  Service: Endoscopy;  Laterality: N/A;  . ESOPHAGOGASTRODUODENOSCOPY N/A 12/30/2015   Procedure: ESOPHAGOGASTRODUODENOSCOPY (EGD);  Surgeon: Ronald Lobo, MD;  Location: [facility] ENDOSCOPY;  Service: Endoscopy;  Laterality: N/A;  . ESOPHAGOGASTRODUODENOSCOPY (EGD) WITH PROPOFOL N/A 01/24/2016   Procedure: ESOPHAGOGASTRODUODENOSCOPY (EGD) WITH PROPOFOL;  Surgeon: Wonda Horner, MD;  Location: [facility] ENDOSCOPY;  Service: Endoscopy;  Laterality: N/A;  . HERNIA REPAIR    . HOT HEMOSTASIS N/A 01/24/2016   Procedure: HOT HEMOSTASIS (ARGON PLASMA COAGULATION/BICAP);  Surgeon: Wonda Horner, MD;  Location: [facility] ENDOSCOPY;  Service: Endoscopy;  Laterality: N/A;    Allergies: Penicillins  Medications: Prior to Admission medications   Medication Sig Start Date End Date Taking? Authorizing Provider  acetaminophen (TYLENOL) 500 MG tablet Take 500-1,000 mg by mouth every 8 (eight) hours as needed (for pain).   Yes Historical Provider, MD  ferrous sulfate 325 (65 FE) MG tablet Take 1 tablet (325 mg total) by mouth 3 (three) times daily with meals. Patient taking differently: Take 325 mg by mouth every evening.  07/17/15  Yes Dayna N Dunn, PA-C  folic acid (FOLVITE) 1 MG tablet Take 1 tablet (1 mg total) by mouth daily. 01/02/16  Yes Thurnell Lose, MD  furosemide (LASIX) 80 MG tablet Take 80 mg by mouth daily.   Yes Historical Provider, MD  nitroGLYCERIN (NITROSTAT) 0.4 MG SL tablet Place 0.4 mg under the tongue every 5 (five) minutes as needed for chest pain.   Yes Historical Provider, MD  potassium chloride SA (K-DUR,KLOR-CON) 20 MEQ tablet Take 20 mEq by mouth every morning.    Yes Historical Provider, MD  spironolactone (ALDACTONE) 50  MG tablet Take 1 tablet (50 mg total) by mouth daily. Patient taking differently: Take 50 mg by mouth 2 (two) times daily.  01/02/16  Yes Thurnell Lose, MD  thiamine 100 MG tablet Take 1 tablet (100 mg total) by mouth daily. 01/02/16  Yes Thurnell Lose, MD  traMADol (ULTRAM) 50 MG tablet Take 50 mg by mouth every 6 (six) hours as needed (for pain).   Yes Historical Provider, MD  lactulose (CHRONULAC) 10 GM/15ML solution Take  30 mLs (20 g total) by mouth daily. Patient not taking: Reported on 03/29/2017 01/26/16   Florencia Reasons, MD  omeprazole (PRILOSEC) 40 MG capsule Take 1 capsule (40 mg total) by mouth every evening. Patient not taking: Reported on 03/29/2017 01/02/16   Thurnell Lose, MD  ranitidine (ZANTAC) 150 MG tablet Take 150 mg by mouth 2 (two) times daily.    Historical Provider, MD  rifaximin (XIFAXAN) 550 MG TABS tablet Take 1 tablet (550 mg total) by mouth 2 (two) times daily. Patient not taking: Reported on 03/29/2017 01/26/16   Florencia Reasons, MD     Family History  Problem Relation Age of Onset  . Liver cancer Mother   . Diabetes Mellitus II Sister     Social History   Social History  . Marital status: Single    Spouse name: N/A  . Number of children: N/A  . Years of education: N/A   Social History Main Topics  . Smoking status: Former Smoker    Packs/day: 2.00    Years: 40.00    Types: Cigarettes    Quit date: 08/14/2008  . Smokeless tobacco: Never Used  . Alcohol use 0.0 oz/week     Comment: 1 pint whiskey daily several beer weekly    01/2016  " no alcohol in 3 weeks "  . Drug use: No  . Sexual activity: Not Asked   Other Topics Concern  . None   Social History Narrative  . None    Review of Systems  Unable to perform ROS: Unstable vital signs   Vital Signs: BP (!) 93/51   Pulse (!) 118   Temp 98.2 F (36.8 C) (Oral)   Resp 17   Ht 5\' 11"  (1.803 m)   Wt 200 lb 6.4 oz (90.9 kg)   SpO2 100%   BMI 27.95 kg/m   Physical Exam  Constitutional: He appears well-developed and well-nourished.  Cardiovascular: Regular rhythm and normal heart sounds.   tachycardia  Pulmonary/Chest: Effort normal and breath sounds normal.  Neurological:  Groggy from sedation, awakens easily but falls asleep quickly.  Nursing note and vitals reviewed.   Mallampati Score:  MD Evaluation Airway: WNL Heart: WNL Abdomen: WNL Chest/ Lungs: WNL ASA  Classification: 3 Mallampati/Airway Score:  Two  Imaging: Dg Chest 2 View  Result Date: 03/29/2017 CLINICAL DATA:  66 year old male with cough. EXAM: CHEST  2 VIEW COMPARISON:  Chest radiograph dated 12/29/2015 FINDINGS: There is emphysematous changes of the lungs with mild interstitial coarsening. No focal consolidation, pleural effusion, or pneumothorax. The cardiac silhouette is within normal limits. There is degenerative changes of the spine. No acute osseous pathology. IMPRESSION: No active cardiopulmonary disease. COPD. Electronically Signed   By: Anner Crete M.D.   On: 03/29/2017 23:38   Nm Gi Blood Loss  Result Date: 03/30/2017 CLINICAL DATA:  66 year old with acute GI bleeding. EXAM: NUCLEAR MEDICINE GASTROINTESTINAL BLEEDING SCAN TECHNIQUE: Sequential abdominal images were obtained following intravenous administration of Tc-76m labeled red blood cells. RADIOPHARMACEUTICALS:  26.4 mCi Tc-95m in-vitro labeled red cells. COMPARISON:  None FINDINGS: The study is positive for a GI bleed. Localization of the bleeding is difficult. There is activity crossing the midline but the most activity is seen in the right abdomen. The configuration of the uptake in the right abdomen raises concern for small bowel. Without cross-sectional imaging, it is hard to know if this activity is possibly involving the transverse colon. IMPRESSION: Positive for GI bleeding. Difficult to localize the source of the bleeding. Findings are concerning for blood in the small bowel. These results were called by telephone at the time of interpretation on 03/30/2017 at 10:52 am to Dr. Velna Ochs, who verbally acknowledged these results. Electronically Signed   By: Markus Daft M.D.   On: 03/30/2017 10:54    Labs:  CBC:  Recent Labs  03/29/17 1634 03/29/17 2208 03/30/17 0228 03/30/17 0728  WBC 13.6* 14.6* 18.3*  --   HGB 12.4* 9.9* 10.0* 7.8*  HCT 36.7* 29.5* 30.0* 23.0*  PLT 130* 112* 128*  --     COAGS:  Recent Labs  03/29/17 1855  INR 1.34     BMP:  Recent Labs  03/29/17 1634 03/29/17 2208 03/30/17 0228  NA 135 135 135  K 6.1* 5.0 4.4  CL 107 107 109  CO2 22 19* 19*  GLUCOSE 154* 149* 153*  BUN 29* 34* 29*  CALCIUM 9.2 8.4* 8.3*  CREATININE 0.92 0.91 0.93  GFRNONAA >60 >60 >60  GFRAA >60 >60 >60    LIVER FUNCTION TESTS:  Recent Labs  03/29/17 1634 03/30/17 0228  BILITOT 1.3* 1.1  AST 32 33  ALT 27 23  ALKPHOS 86 70  PROT 6.0* 5.0*  ALBUMIN 3.4* 3.0*    TUMOR MARKERS: No results for input(s): AFPTM, CEA, CA199, CHROMGRNA in the last 8760 hours.  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  Patient to start octreotide and to receive additional PBRCs.  Dr Anselm Pancoast discussed case with Dr. Oletta Lamas.  Given history of cirrhosis and unknown bleeding source recommend for patient to undergo CTA with BRTO protocol. IR available pending results and hemodynamics.  No family at bedside.   Thank you for this interesting consult.  I greatly enjoyed meeting HERSH MINNEY and look forward to participating in their care.  A copy of this report was sent to the requesting provider on this date.  Electronically Signed: Docia Barrier 03/30/2017, 2:40 PM   I spent a total of 40 Minutes    in face to face in clinical consultation, greater than 50% of which was counseling/coordinating care for GI bleed

## 2017-03-30 NOTE — Progress Notes (Signed)
Patient arrived to room 4N14 from ED as a new admission. Patient immediately went to bathroom and reported having a large bloody stool.

## 2017-03-30 NOTE — Progress Notes (Signed)
Pt had approx 271ml bright red liquid stool. Pt awake and alert. In no distress. PRBC's trasfusion completed, IV fluids increased to 258ml/hr.

## 2017-03-30 NOTE — Progress Notes (Addendum)
   Subjective:  Patient continues to have bloody bowel movements. He endorses feeling very fatigued. He denies lightheadedness, dizziness, chest pain, and SOB.   Objective:  Vital signs in last 24 hours: Vitals:   03/30/17 0700 03/30/17 0800 03/30/17 0915 03/30/17 0929  BP: (!) 105/48 95/72 (!) 77/47 (!) 95/43  Pulse: (!) 112 (!) 113  (!) 113  Resp: 16 15  15   Temp:    98.4 F (36.9 C)  TempSrc:    Oral  SpO2: 100% 100%    Weight:      Height:       Physical Exam Constitutional: Ill appearing  Cardiovascular: tachycardic but regular, no murmurs, rubs, or gallops.  Pulmonary/Chest: CTAB, no wheezes, rales, or rhonchi.  Abdominal: Soft, non tender, non distended. +BS.  Extremities: Warm and well perfused.  No edema.  Neurological: A&Ox3, CN II - XII grossly intact.  Skin: No rashes or erythema  Psychiatric: Normal mood and affect  Assessment/Plan:  Patient is a 66 year old male with a past medical history of cirrhosis, prior alcohol abuse (sober for the past year), HFpEF, 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   Acute GI Bleed: Patient continues to have multiple bloody bowel movements since admission. Overnight he became progressively more hypotensive and tachycardic despite fluid resuscitation. Hemoglobin has down trended from 12.4 on admission to 7.8. GI was consulted who recommended tagged RBC study. He is status post 1 unit of packed RBCs overnight and 2 units of FFP this morning. -- GI following, appreciate recs -- Tagged rbc study pending; possible f/u angiogram if bleeding is documented in the left colon on scan. If no bleeding, GI will likely perform EGD -- Continue IV Protonix  -- Continue IVFs  -- Trend H&H q8h  -- Transfuse prn to maintain hgb > 7.0   Leukocytosis: Patient is afebrile, but wbc today has increased to 18.3. He endorsed cough x 1 week but CXR was negative  for consolidation or infiltrate. In the setting of his acute GI bleed, etiology could certainly be GI. Patient was started on IV ceftriaxone overnight.  -- F/u 4/28 blood cultures  -- Continue IV ceftriaxone   Hx HFpEF -- Hold furosemide  Hx Cirrhosis -- Hold spironolactone -- Hold lactulose  DVT/PE prophylaxis: SCDs FEN/GI: Nothing by mouth Code: Full code   Dispo: Anticipated discharge in approximately 2-3 day(s).    Velna Ochs, MD 03/30/2017, 9:46 AM Pager: [phone]  If no return after 15 minutes, or after hours, please page:  1st Contact: Pager: [phone] 2nd Contact: Pager: [phone]

## 2017-03-30 NOTE — Progress Notes (Signed)
Fentanyl 60mcg and versed 3mg  wasted with Fabienne Bruns, RN.

## 2017-03-30 NOTE — Progress Notes (Signed)
Subjective  I was paged by the RN that the patient had two additional very large bloody bowel movements. Additionally, he was complaining that he was feeling weaker, light headed and dizzy. His HR had also been increasing despite fluid resuscitation. I went and evaluated the patient and he did endorse new symptoms of feeling dizzy and light headed. He also states the he feels week. I did see one large bloody bowel movement in the toilet.   Objective   Vitals:   03/29/17 2329 03/30/17 0417  BP: (!) 100/57 (!) 96/47  Pulse: (!) 118 (!) 112  Resp: (!) 24 (!) 22  Temp: 98.2 F (36.8 C) 99.4 F (37.4 C)   Physical Exam  Constitutional: He is oriented to person, place, and time. He appears well-developed and well-nourished.  Pt complaining of feeling weaker  Neurological: He is alert and oriented to person, place, and time.   Assessment  Patient with continued BRBPR with increasing HR and new symptoms. This is c/w ongoing GI bleeding and worsening hemodynamics. Most recent Hg of 10.0 and increase WBCs @ 18.3. I am primarily concerned for ongoing bleeding. Patient with worsening white count without obvious infectious source. Most likely etiology would be GI given ongoing bleed. Will start with Ceftriaxone and order blood cultures to evaluate for potential underling bacteremia which may be a driving factor of the patients tachycardia. However, I suspect his tachycardia is mainly 2/2 ongoing bleeding and acute blood loss anemia.   Plan  -- Will call and discuss with GI for ongoing recs -- Give 1 L NS bolus now -- Repeat  H/H STAT -- Start IV Ceftriaxone per pharmacy -- Blood Cultures

## 2017-03-30 NOTE — Consult Note (Signed)
[facility] CONSULT Reason for consult: G.I. bleeding Referring Physician: Internal Medicine Teaching Service. ECP: Dr. Arelia Sneddon. Primary G.I.: Dr. [name] is an 66 y.o. male.  HPI: he has been followed by Dr. Michail Sermon for some time and carries a diagnosis of portal gastropathy and mild cirrhosis. The etiology of his cirrhosis is felt to be alcohol. He's had ultrasound showing no ascites mild irregularity of liver compatible with mild cirrhosis. He said several EGD's with duodenal AVMs being cauterized with question of small varices in the past that were not really prominent and on some EGD's were questioned if they were even real. He has had gastropathy that was felt to be portal gastropathy. This was when he was drinking. The patient reports that he has had no alcohol in one year, 1 month, in several days. He's not having G.I. bleeding or any other symptoms. He has been given lactulose by Dr. Michail Sermon. He had colonoscopy with polyp removal 2016 and at the time that procedure was noted to have extensive diverticulosis of the left colon. The patient takes a lactulose intermittently. He has had reflux in the past and is taken some PPI intermittently. He has had no reflux or abdominal pain recently. He had some heartburn yesterday morning and had a large bowel movement with clots rapidly returned his bright red. He came rapidly to the emergency room. He continued to pass bright red blood with bowel movements. His labs were notable for hemoglobin 12.4 is only dropped to 9.9. Platelet count 130 INR 16.7. Notably his BUN is only 29. The patient has gotten IV PPI having no further heartburn. He's had no nausea vomiting or hematemesis. He's continued to pass bright red blood with bowel movements and has been slightly hypotensive pressure has now come up to approximately 90/55. He denies much wave abdominal pain other than some cramping. I discussed with the resident during the night and we  have him on the schedule for G.I. bleeding scan hopefully early a.m. today.  Past Medical History:  Diagnosis Date  . Alcohol abuse   . Anemia   . AVM (arteriovenous malformation) of colon    a. By colonoscopy 07/2015.  Marland Kitchen Cirrhosis of liver ([place])   . Colon polyps   . COPD (chronic obstructive pulmonary disease) ([place])   . Diastolic CHF ([place])   . GERD (gastroesophageal reflux disease)   . GI bleed 01/2016  . Hypertension   . Hypokalemia   . Internal hemorrhoids   . Portal hypertensive gastropathy ([place])    a. By EGD 07/2015.  . Sigmoid diverticulosis   . SVT (supraventricular tachycardia) ([place])    a. 07/2015 in setting of severe hypokalemia.  . Symptomatic anemia    a. 07/2015 - felt multifactorial from cirrhosis and AVMs.  . Thrombocytopenia ([place])   . Tobacco use     Past Surgical History:  Procedure Laterality Date  . CATARACT EXTRACTION    . COLONOSCOPY N/A 07/15/2015   Procedure: COLONOSCOPY;  Surgeon: Wilford Corner, MD;  Location: [facility] ENDOSCOPY;  Service: Endoscopy;  Laterality: N/A;  . ESOPHAGOGASTRODUODENOSCOPY N/A 07/15/2015   Procedure: ESOPHAGOGASTRODUODENOSCOPY (EGD);  Surgeon: Wilford Corner, MD;  Location[facility] ENDOSCOPY;  Service: Endoscopy;  Laterality: N/A;  . ESOPHAGOGASTRODUODENOSCOPY N/A 12/30/2015   Procedure: ESOPHAGOGASTRODUODENOSCOPY (EGD);  Surgeon: Ronald Lobo, MD;  Location: [facility] ENDOSCOPY;  Service: Endoscopy;  Laterality: N/A;  . ESOPHAGOGASTRODUODENOSCOPY (EGD) WITH PROPOFOL N/A 01/24/2016   Procedure: ESOPHAGOGASTRODUODENOSCOPY (EGD) WITH PROPOFOL;  Surgeon: Wonda Horner, MD;  Location: [facility]  ENDOSCOPY;  Service: Endoscopy;  Laterality: N/A;  . HERNIA REPAIR    . HOT HEMOSTASIS N/A 01/24/2016   Procedure: HOT HEMOSTASIS (ARGON PLASMA COAGULATION/BICAP);  Surgeon: Wonda Horner, MD;  Location: [facility] ENDOSCOPY;  Service: Endoscopy;  Laterality: N/A;    Family History  Problem Relation Age of Onset  . Liver cancer Mother   . Diabetes Mellitus II Sister      Social History:  reports that he quit smoking about 8 years ago. His smoking use included Cigarettes. He has a 80.00 pack-year smoking history. He has never used smokeless tobacco. He reports that he drinks alcohol. He reports that he does not use drugs.  Allergies:  Allergies  Allergen Reactions  . Penicillins Rash    Has patient had a PCN reaction causing immediate rash, facial/tongue/throat swelling, SOB or lightheadedness with hypotension: Yes Has patient had a PCN reaction causing severe rash involving mucus membranes or skin necrosis: No Has patient had a PCN reaction that required hospitalization: No Has patient had a PCN reaction occurring within the last 10 years: No If all of the above answers are "NO", then may proceed with Cephalosporin use.     Medications; Prior to Admission medications   Medication Sig Start Date End Date Taking? Authorizing Provider  acetaminophen (TYLENOL) 500 MG tablet Take 500-1,000 mg by mouth every 8 (eight) hours as needed (for pain).   Yes Historical Provider, MD  ferrous sulfate 325 (65 FE) MG tablet Take 1 tablet (325 mg total) by mouth 3 (three) times daily with meals. Patient taking differently: Take 325 mg by mouth every evening.  07/17/15  Yes Dayna N Dunn, PA-C  folic acid (FOLVITE) 1 MG tablet Take 1 tablet (1 mg total) by mouth daily. 01/02/16  Yes Thurnell Lose, MD  furosemide (LASIX) 80 MG tablet Take 80 mg by mouth daily.   Yes Historical Provider, MD  nitroGLYCERIN (NITROSTAT) 0.4 MG SL tablet Place 0.4 mg under the tongue every 5 (five) minutes as needed for chest pain.   Yes Historical Provider, MD  potassium chloride SA (K-DUR,KLOR-CON) 20 MEQ tablet Take 20 mEq by mouth every morning.    Yes Historical Provider, MD  spironolactone (ALDACTONE) 50 MG tablet Take 1 tablet (50 mg total) by mouth daily. Patient taking differently: Take 50 mg by mouth 2 (two) times daily.  01/02/16  Yes Thurnell Lose, MD  thiamine 100 MG tablet  Take 1 tablet (100 mg total) by mouth daily. 01/02/16  Yes Thurnell Lose, MD  traMADol (ULTRAM) 50 MG tablet Take 50 mg by mouth every 6 (six) hours as needed (for pain).   Yes Historical Provider, MD  lactulose (CHRONULAC) 10 GM/15ML solution Take 30 mLs (20 g total) by mouth daily. Patient not taking: Reported on 03/29/2017 01/26/16   Florencia Reasons, MD  omeprazole (PRILOSEC) 40 MG capsule Take 1 capsule (40 mg total) by mouth every evening. Patient not taking: Reported on 03/29/2017 01/02/16   Thurnell Lose, MD  ranitidine (ZANTAC) 150 MG tablet Take 150 mg by mouth 2 (two) times daily.    Historical Provider, MD  rifaximin (XIFAXAN) 550 MG TABS tablet Take 1 tablet (550 mg total) by mouth 2 (two) times daily. Patient not taking: Reported on 03/29/2017 01/26/16   Florencia Reasons, MD   . pantoprazole (PROTONIX) IV  40 mg Intravenous Q12H  . sodium chloride flush  3 mL Intravenous Q12H   PRN Meds acetaminophen **OR** acetaminophen, ondansetron **OR** ondansetron (ZOFRAN) IV Results for  orders placed or performed during the hospital encounter of 03/29/17 (from the past 48 hour(s))  Comprehensive metabolic panel     Status: Abnormal   Collection Time: 03/29/17  4:34 PM  Result Value Ref Range   Sodium 135 135 - 145 mmol/L   Potassium 6.1 (H) 3.5 - 5.1 mmol/L   Chloride 107 101 - 111 mmol/L   CO2 22 22 - 32 mmol/L   Glucose, Bld 154 (H) 65 - 99 mg/dL   BUN 29 (H) 6 - 20 mg/dL   Creatinine, Ser 0.92 0.61 - 1.24 mg/dL   Calcium 9.2 8.9 - 10.3 mg/dL   Total Protein 6.0 (L) 6.5 - 8.1 g/dL   Albumin 3.4 (L) 3.5 - 5.0 g/dL   AST 32 15 - 41 U/L   ALT 27 17 - 63 U/L   Alkaline Phosphatase 86 38 - 126 U/L   Total Bilirubin 1.3 (H) 0.3 - 1.2 mg/dL   GFR calc non Af Amer >60 >60 mL/min   GFR calc Af Amer >60 >60 mL/min    Comment: (NOTE) The eGFR has been calculated using the CKD EPI equation. This calculation has not been validated in all clinical situations. eGFR's persistently <60 mL/min signify possible  Chronic Kidney Disease.    Anion gap 6 5 - 15  CBC     Status: Abnormal   Collection Time: 03/29/17  4:34 PM  Result Value Ref Range   WBC 13.6 (H) 4.0 - 10.5 K/uL   RBC 3.99 (L) 4.22 - 5.81 MIL/uL   Hemoglobin 12.4 (L) 13.0 - 17.0 g/dL   HCT 36.7 (L) 39.0 - 52.0 %   MCV 92.0 78.0 - 100.0 fL   MCH 31.1 26.0 - 34.0 pg   MCHC 33.8 30.0 - 36.0 g/dL   RDW 13.9 11.5 - 15.5 %   Platelets 130 (L) 150 - 400 K/uL  Type and screen [place]     Status: None (Preliminary result)   Collection Time: 03/29/17  4:35 PM  Result Value Ref Range   ABO/RH(D) O POS    Antibody Screen NEG    Sample Expiration 04/01/2017    Unit Number [id number]    Blood Component Type RED CELLS,LR    Unit division 00    Status of Unit ISSUED    Transfusion Status OK TO TRANSFUSE    Crossmatch Result Compatible   Lipase, blood     Status: None   Collection Time: 03/29/17  6:55 PM  Result Value Ref Range   Lipase 31 11 - 51 U/L  Protime-INR     Status: Abnormal   Collection Time: 03/29/17  6:55 PM  Result Value Ref Range   Prothrombin Time 16.7 (H) 11.4 - 15.2 seconds   INR 1.34   POC occult blood, ED     Status: Abnormal   Collection Time: 03/29/17  6:58 PM  Result Value Ref Range   Fecal Occult Bld POSITIVE (A) NEGATIVE  CBG monitoring, ED     Status: Abnormal   Collection Time: 03/29/17  9:02 PM  Result Value Ref Range   Glucose-Capillary 134 (H) 65 - 99 mg/dL  CBC with Differential     Status: Abnormal   Collection Time: 03/29/17 10:08 PM  Result Value Ref Range   WBC 14.6 (H) 4.0 - 10.5 K/uL   RBC 3.22 (L) 4.22 - 5.81 MIL/uL   Hemoglobin 9.9 (L) 13.0 - 17.0 g/dL    Comment: REPEATED TO VERIFY   HCT 29.5 (  L) 39.0 - 52.0 %   MCV 91.6 78.0 - 100.0 fL   MCH 30.7 26.0 - 34.0 pg   MCHC 33.6 30.0 - 36.0 g/dL   RDW 14.0 11.5 - 15.5 %   Platelets 112 (L) 150 - 400 K/uL    Comment: PLATELET COUNT CONFIRMED BY SMEAR   Neutrophils Relative % 71 %   Neutro Abs 10.4 (H) 1.7 - 7.7  K/uL   Lymphocytes Relative 16 %   Lymphs Abs 2.3 0.7 - 4.0 K/uL   Monocytes Relative 13 %   Monocytes Absolute 1.8 (H) 0.1 - 1.0 K/uL   Eosinophils Relative 1 %   Eosinophils Absolute 0.1 0.0 - 0.7 K/uL   Basophils Relative 0 %   Basophils Absolute 0.0 0.0 - 0.1 K/uL  Basic metabolic panel     Status: Abnormal   Collection Time: 03/29/17 10:08 PM  Result Value Ref Range   Sodium 135 135 - 145 mmol/L   Potassium 5.0 3.5 - 5.1 mmol/L    Comment: DELTA CHECK NOTED   Chloride 107 101 - 111 mmol/L   CO2 19 (L) 22 - 32 mmol/L   Glucose, Bld 149 (H) 65 - 99 mg/dL   BUN 34 (H) 6 - 20 mg/dL   Creatinine, Ser 0.91 0.61 - 1.24 mg/dL   Calcium 8.4 (L) 8.9 - 10.3 mg/dL   GFR calc non Af Amer >60 >60 mL/min   GFR calc Af Amer >60 >60 mL/min    Comment: (NOTE) The eGFR has been calculated using the CKD EPI equation. This calculation has not been validated in all clinical situations. eGFR's persistently <60 mL/min signify possible Chronic Kidney Disease.    Anion gap 9 5 - 15  I-Stat CG4 Lactic Acid, ED     Status: Abnormal   Collection Time: 03/29/17 10:14 PM  Result Value Ref Range   Lactic Acid, Venous 1.91 (HH) 0.5 - 1.9 mmol/L  MRSA PCR Screening     Status: None   Collection Time: 03/29/17 11:11 PM  Result Value Ref Range   MRSA by PCR NEGATIVE NEGATIVE    Comment:        The GeneXpert MRSA Assay (FDA approved for NASAL specimens only), is one component of a comprehensive MRSA colonization surveillance program. It is not intended to diagnose MRSA infection nor to guide or monitor treatment for MRSA infections.   CBC     Status: Abnormal   Collection Time: 03/30/17  2:28 AM  Result Value Ref Range   WBC 18.3 (H) 4.0 - 10.5 K/uL   RBC 3.25 (L) 4.22 - 5.81 MIL/uL   Hemoglobin 10.0 (L) 13.0 - 17.0 g/dL   HCT 30.0 (L) 39.0 - 52.0 %   MCV 92.3 78.0 - 100.0 fL   MCH 30.8 26.0 - 34.0 pg   MCHC 33.3 30.0 - 36.0 g/dL   RDW 14.3 11.5 - 15.5 %   Platelets 128 (L) 150 - 400 K/uL   Comprehensive metabolic panel     Status: Abnormal   Collection Time: 03/30/17  2:28 AM  Result Value Ref Range   Sodium 135 135 - 145 mmol/L   Potassium 4.4 3.5 - 5.1 mmol/L   Chloride 109 101 - 111 mmol/L   CO2 19 (L) 22 - 32 mmol/L   Glucose, Bld 153 (H) 65 - 99 mg/dL   BUN 29 (H) 6 - 20 mg/dL   Creatinine, Ser 0.93 0.61 - 1.24 mg/dL   Calcium 8.3 (L) 8.9 - 10.3 mg/dL  Total Protein 5.0 (L) 6.5 - 8.1 g/dL   Albumin 3.0 (L) 3.5 - 5.0 g/dL   AST 33 15 - 41 U/L   ALT 23 17 - 63 U/L   Alkaline Phosphatase 70 38 - 126 U/L   Total Bilirubin 1.1 0.3 - 1.2 mg/dL   GFR calc non Af Amer >60 >60 mL/min   GFR calc Af Amer >60 >60 mL/min    Comment: (NOTE) The eGFR has been calculated using the CKD EPI equation. This calculation has not been validated in all clinical situations. eGFR's persistently <60 mL/min signify possible Chronic Kidney Disease.    Anion gap 7 5 - 15  Prepare RBC     Status: None   Collection Time: 03/30/17  5:41 AM  Result Value Ref Range   Order Confirmation ORDER PROCESSED BY BLOOD BANK     Dg Chest 2 View  Result Date: 03/29/2017 CLINICAL DATA:  66 year old male with cough. EXAM: CHEST  2 VIEW COMPARISON:  Chest radiograph dated 12/29/2015 FINDINGS: There is emphysematous changes of the lungs with mild interstitial coarsening. No focal consolidation, pleural effusion, or pneumothorax. The cardiac silhouette is within normal limits. There is degenerative changes of the spine. No acute osseous pathology. IMPRESSION: No active cardiopulmonary disease. COPD. Electronically Signed   By: Anner Crete M.D.   On: 03/29/2017 23:38               Blood pressure (!) 86/56, pulse (!) 113, temperature 97.6 F (36.4 C), temperature source Oral, resp. rate 14, height _0  (1.803 m), weight 90.9 kg (200 lb 6.4 oz), SpO2 100 %.  Physical exam:   General--Alert white male in no distress ENT-- nonicteric, oral mucous membranes quite dry Neck-- supple Heart--  tachycardic Lungs-- clear Abdomen-- completely nontender in the epigastrium. Bowel sounds normal. Rectal-- not perform patient had bowel movement in bedpan maroonish to bright red Psych-- alert oriented and answers questions appropriately   Assessment: 1. G.I. bleed. This is very consistent with a lower G.I. bleed. He does have some history of liver disease that is never had significant if any varices on multiple EGD's in the past and his stop drinking completely for the past year or so. As BUN is barely above normal in the face of continued ongoing bleeding. Colonoscopy a couple years ago showed extensive diverticulosis of the left colon and I suspect that this is a diverticular bleeding. 2. History of liver disease. This apparently was fairly minimal felt to be due to alcohol. The patient is subsequently quit drinking in his LFTs were fairly normal on admission. His PT was elevated a little and in the face of active bleeding think we should give him some FFP  Plan: 1. I will increases IV fluid rate. He has been only on hundred and 50 mL/hr while NPO and actively bleeding. I will go ahead and increase this to 250 mL per hour until his blood pressure stabilizes. We will also give him a couple units of FFP. 2. Would go ahead and get G.I. bleeding scan the soonest possible this morning. The patient appears to have LGI bleed probably diverticular disease and if this continues he may need an angiogram if bleeding in the left colon is documented on the G.I. bleeding scan. If there is no documented bleeding and he continues to bleed, we will perform EGD just to make certain there is no upper G.I. source. With no other upper G.I. symptoms in the face of minimal elevation of BUN, UGI  bleeding is much less likely. Would still continue with empiric IV PPI. We will keep NPO in case EGD is needed.   Aurilla Coulibaly JR,Eldor Conaway L 03/30/2017, 7:14 AM   This note was created using voice recognition software and minor errors  may Have occurred unintentionally. Pager: [phone] If no answer or after hours call [phone]

## 2017-03-30 NOTE — H&P (Signed)
Internal Medicine Attending Admission Note  I saw and evaluated the patient. I reviewed the resident's note and I agree with the resident's findings and plan as documented in the resident's note.  Assessment & Plan by Problem:   GI bleeding in patient with Cirrhosis, alcoholic ([place]) with history of AVM (arteriovenous malformation) of colon and Sigmoid diverticulosis and Thrombocytopenia ([place]) - Appreciate GI consult, we will increase his IVF to 250cc, will reassess later in day, we may need to scale back once HR and BP improves to avoid overload with his dCHF. - Agree with transfusion, will check CBC Q8., FFP ordered by GI. -Continue PPI but suspicion is more for lower GI bleed, AVM versus diverticular, he will go for tagged RBC scan today -continue IV ceftriaxone given leukocytosis and GI bleed in cirrhosis. -If any additional bleeding would go ahead and transfuse 2 additional units.  Chief Complaint(s):rectal bleeding  History - key components related to admission: Briefly John Warren is a 66 yo M with PMH of of alcoholic cirrhosis, heart failure with preserved ejection fraction, GI bleeding secondary to AVM and sigmoid diverticulosis who presented to the emergency department with one-day history of bloody bowel movements.  Patient reports he was in his normal state of health until yesterday morning when he had a large dark tarry bowel movement, this was followed a few hours later by a bright red bloody bowel movement. Given his history of GI bleeding and he presented to the emergency department. He denies any changes to his medications, he is not using any nonsteroidal medications.  He denies any nausea, fever, chills, abdominal pain, dysuria or cough.  Since his admission he has had multiple additional blood bowel movements, he was given IVF bolus overnight and transfused 1 unit this morning.   Lab results: Reviewed in Epic  Physical Exam - key components related to admission: General: ill  appearing HEENT: EOMI, no scleral icterus Cardiac: mildly tachycardic, regular, no mumur Pulm: clear to auscultation bilaterally, moving normal volumes of air Abd: soft, nontender, nondistended, BS present Ext: warm and well perfused, no pedal edema Neuro: alert and oriented X3  Vitals:   03/30/17 0950 03/30/17 0952 03/30/17 1005 03/30/17 1011  BP:  (!) 96/52 (!) 106/50   Pulse:  (!) 103    Resp:  16    Temp: 99.1 F (37.3 C) 99.1 F (37.3 C) 97.9 F (36.6 C)   TempSrc: Oral Oral Oral   SpO2:   100% 100%  Weight:      Height:

## 2017-03-30 NOTE — Progress Notes (Signed)
Paged teaching svc regarding potential need for cardiology consult. Per day shift RN, while pt was in CT, had episode of CP, HR 130s, EKG showed noted new ST depression. RN also stated that she auscultated a heart murmur that was not present at beginning of her shift at 0700. EKG has resolved now, showing sinus tachycardia without ST depression, pt denies chest pain at this time. 4th unit of blood infusing & almost complete. No note or order for cardiology consult at this time. Toponins have not been drawn as phlebotomy states they cannot draw until blood tx is complete. MD made aware of all of the above stated that will continue to monitor patient for now, is reassured pt symptoms have resolved with blood transfusions. MD spoke with phlebotomy and they will come draw troponin now. Will continue to monitor closely.

## 2017-03-30 NOTE — Progress Notes (Signed)
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 Discussed with Dr Jeronimo Norma. We will begin octreotide infusion and if bleeding continues would go ahead with CT angiogram of mesenteric vessels with gastric varices protocol or mesenteric angiogram. Will get IR on board and continue ice chips and give some miralax to begin clean out

## 2017-03-30 NOTE — Op Note (Signed)
[facility] Patient Name: John Warren Procedure Date : 03/30/2017 MRN: [id number] Attending MD: Nancy Fetter Dr., MD Date of Birth: [birth year] CSN: [id number] Age: 66 Admit Type: Inpatient Procedure:                Upper GI endoscopy Indications:              Hematochezia Providers:                Jeneen Rinks L. Teresina Bugaj Dr., MD, Kingsley Plan, RN,                            Cherylynn Ridges, Technician Referring MD:              Medicines:                Fentanyl 25 micrograms IV, Midazolam 2 mg IV,                            Cetacaine spray Complications:            No immediate complications. Estimated Blood Loss:     Estimated blood loss: none. Procedure:                Pre-Anesthesia Assessment:                           - Prior to the procedure, a History and Physical                            was performed, and patient medications and                            allergies were reviewed. The patient's tolerance of                            previous anesthesia was also reviewed. The risks                            and benefits of the procedure and the sedation                            options and risks were discussed with the patient.                            All questions were answered, and informed consent                            was obtained. Prior Anticoagulants: The patient has                            taken no previous anticoagulant or antiplatelet                            agents. ASA Grade Assessment: III - A patient with  severe systemic disease. After reviewing the risks                            and benefits, the patient was deemed in                            satisfactory condition to undergo the procedure.                           After obtaining informed consent, the endoscope was                            passed under direct vision. Throughout the                            procedure, the patient's blood pressure,  pulse, and                            oxygen saturations were monitored continuously. The                            EG-[id number] ([id number]) scope was introduced through the                            mouth, and advanced to the third part of duodenum.                            The upper GI endoscopy was accomplished without                            difficulty. The patient tolerated the procedure                            well. Scope In: Scope Out: Findings:      There is no endoscopic evidence of Barrett's esophagus, bleeding,       esophagitis, salmon-colored mucosa, stricture or varices in the entire       esophagus.      Type 1 isolated gastric varices (IGV1, varices located in the fundus)       with no bleeding were found at the gastroesophageal junction. There were       no stigmata of recent bleeding. They were medium in largest diameter.      A single 3 mm no bleeding angiodysplastic lesion was found in the       gastric fundus.      There is no endoscopic evidence of bleeding, inflammation or ulceration       in the entire examined stomach.      The examined duodenum was normal. Impression:               - Type 1 isolated gastric varices (IGV1, varices                            located in the fundus), without bleeding.                           -  A single non-bleeding angiodysplastic lesion in                            the stomach.                           - Normal examined duodenum.                           - No specimens collected.                           - Blood in stool without cause found on endoscopic                            exam. no signs of active or recent bleeding. Moderate Sedation:      Moderate (conscious) sedation was administered by the endoscopy nurse       and supervised by the endoscopist. The following parameters were       monitored: oxygen saturation, heart rate, blood pressure, respiratory       rate, EKG, adequacy of pulmonary ventilation, and  response to care. Recommendation:           - Resume previous diet.                           - Post procedure medication orders were given.                           - Administer an IV bolus of 50 micrograms of                            octreotide followed by an infusion of 50 micrograms                            per hour.                           - will discuss with interventional radiology Procedure Code(s):        --- Professional ---                           [phone], Esophagogastroduodenoscopy, flexible,                            transoral; diagnostic, including collection of                            specimen(s) by brushing or washing, when performed                            (separate procedure) Diagnosis Code(s):        --- Professional ---                           I86.4, Gastric varices  K31.819, Angiodysplasia of stomach and duodenum                            without bleeding                           K92.1, Melena (includes Hematochezia) CPT copyright 2016 American [employer] Association. All rights reserved. The codes documented in this report are preliminary and upon coder review may  be revised to meet current compliance requirements. Nancy Fetter Dr., MD 03/30/2017 3:09:28 PM This report has been signed electronically. Number of Addenda: 0

## 2017-03-30 NOTE — Progress Notes (Signed)
Patient had a third bloody liquid stool approximately 250 mls on BSC. Patient became tachycardiac sustaining in 130's for over an hour. Dr. Lovena Le called and came to see patient. Ordered 1L bolus stat, 1 unit of blood, and a bleeding scan. Will administer blood as soon as ready and continue to monitor.

## 2017-03-31 DIAGNOSIS — R079 Chest pain, unspecified: Secondary | ICD-10-CM

## 2017-03-31 LAB — HEMOGLOBIN AND HEMATOCRIT, BLOOD
HCT: 23.2 % — ABNORMAL LOW (ref 39.0–52.0)
HEMATOCRIT: 24.1 % — AB (ref 39.0–52.0)
Hemoglobin: 8.4 g/dL — ABNORMAL LOW (ref 13.0–17.0)
Hemoglobin: 7.8 g/dL — ABNORMAL LOW (ref 13.0–17.0)

## 2017-03-31 LAB — PREPARE FRESH FROZEN PLASMA
UNIT DIVISION: 0
Unit division: 0

## 2017-03-31 LAB — BPAM FFP
BLOOD PRODUCT EXPIRATION DATE: 201805012359
BLOOD PRODUCT EXPIRATION DATE: 201805012359
ISSUE DATE / TIME: 201804280923
ISSUE DATE / TIME: 201804280923
UNIT TYPE AND RH: 5100
Unit Type and Rh: 5100

## 2017-03-31 LAB — CBC
HCT: 27.2 % — ABNORMAL LOW (ref 39.0–52.0)
Hemoglobin: 9.2 g/dL — ABNORMAL LOW (ref 13.0–17.0)
MCH: 30.7 pg (ref 26.0–34.0)
MCHC: 33.8 g/dL (ref 30.0–36.0)
MCV: 90.7 fL (ref 78.0–100.0)
PLATELETS: 120 10*3/uL — AB (ref 150–400)
RBC: 3 MIL/uL — ABNORMAL LOW (ref 4.22–5.81)
RDW: 16.9 % — ABNORMAL HIGH (ref 11.5–15.5)
WBC: 23.3 10*3/uL — ABNORMAL HIGH (ref 4.0–10.5)

## 2017-03-31 LAB — BASIC METABOLIC PANEL
Anion gap: 8 (ref 5–15)
BUN: 29 mg/dL — AB (ref 6–20)
CHLORIDE: 113 mmol/L — AB (ref 101–111)
CO2: 18 mmol/L — ABNORMAL LOW (ref 22–32)
Calcium: 7.7 mg/dL — ABNORMAL LOW (ref 8.9–10.3)
Creatinine, Ser: 1.05 mg/dL (ref 0.61–1.24)
GFR calc Af Amer: >60 mL/min (ref >60)
GFR calc non Af Amer: >60 mL/min (ref >60)
GLUCOSE: 132 mg/dL — AB (ref 65–99)
POTASSIUM: 4.4 mmol/L (ref 3.5–5.1)
Sodium: 139 mmol/L (ref 135–145)

## 2017-03-31 LAB — PROTIME-INR
INR: 1.32
PROTHROMBIN TIME: 16.5 s — AB (ref 11.4–15.2)

## 2017-03-31 LAB — TROPONIN I
TROPONIN I: 0.06 ng/mL — AB (ref <0.03)
TROPONIN I: 0.05 ng/mL — AB (ref <0.03)

## 2017-03-31 LAB — PREPARE RBC (CROSSMATCH)

## 2017-03-31 MED ORDER — NITROGLYCERIN 0.4 MG SL SUBL
SUBLINGUAL_TABLET | SUBLINGUAL | Status: AC
  Administered 2017-03-31: 0.4 mg
  Filled 2017-03-31: qty 1

## 2017-03-31 MED ORDER — SODIUM CHLORIDE 0.9 % IV SOLN
INTRAVENOUS | Status: DC
  Administered 2017-03-31 (×2): via INTRAVENOUS

## 2017-03-31 MED ORDER — SODIUM CHLORIDE 0.9 % IV SOLN
Freq: Once | INTRAVENOUS | Status: AC
  Administered 2017-03-31: 14:00:00 via INTRAVENOUS

## 2017-03-31 MED ORDER — LACTULOSE 10 GM/15ML PO SOLN
10.0000 g | Freq: Two times a day (BID) | ORAL | Status: DC
  Administered 2017-03-31 – 2017-04-01 (×3): 10 g via ORAL
  Filled 2017-03-31 (×3): qty 15

## 2017-03-31 MED ORDER — NITROGLYCERIN 0.4 MG SL SUBL
0.4000 mg | SUBLINGUAL_TABLET | SUBLINGUAL | Status: DC | PRN
  Administered 2017-03-31 (×3): 0.4 mg via SUBLINGUAL
  Filled 2017-03-31 (×3): qty 1

## 2017-03-31 MED ORDER — FAMOTIDINE 20 MG PO TABS
10.0000 mg | ORAL_TABLET | Freq: Once | ORAL | Status: AC
  Administered 2017-03-31: 10 mg via ORAL
  Filled 2017-03-31: qty 1

## 2017-03-31 MED ORDER — ORAL CARE MOUTH RINSE: 15.0000 mL | Freq: Two times a day (BID) | OROMUCOSAL | Status: DC

## 2017-03-31 MED ORDER — ORAL CARE MOUTH RINSE
15.0000 mL | Freq: Two times a day (BID) | OROMUCOSAL | Status: DC
  Administered 2017-03-31: 15 mL via OROMUCOSAL

## 2017-03-31 MED ORDER — SODIUM CHLORIDE 0.9 % IV SOLN: INTRAVENOUS | Status: DC

## 2017-03-31 MED ORDER — SODIUM CHLORIDE 0.9 % IV SOLN
INTRAVENOUS | Status: DC
  Administered 2017-03-31: 20:00:00 via INTRAVENOUS

## 2017-03-31 NOTE — Progress Notes (Signed)
Referring Physician(s):  Dr. Laurence Spates  Supervising Physician: Markus Daft  Patient Status:  [facility] - In-pt  Chief Complaint:  Melena  Subjective: Patient sitting up in bed, drinking liquids at time of visit.  Feeling much better. Last bowel movement was this AM.    Allergies: Penicillins  Medications: Prior to Admission medications   Medication Sig Start Date End Date Taking? Authorizing Provider  acetaminophen (TYLENOL) 500 MG tablet Take 500-1,000 mg by mouth every 8 (eight) hours as needed (for pain).   Yes Historical Provider, MD  ferrous sulfate 325 (65 FE) MG tablet Take 1 tablet (325 mg total) by mouth 3 (three) times daily with meals. Patient taking differently: Take 325 mg by mouth every evening.  07/17/15  Yes Dayna N Dunn, PA-C  folic acid (FOLVITE) 1 MG tablet Take 1 tablet (1 mg total) by mouth daily. 01/02/16  Yes Thurnell Lose, MD  furosemide (LASIX) 80 MG tablet Take 80 mg by mouth daily.   Yes Historical Provider, MD  nitroGLYCERIN (NITROSTAT) 0.4 MG SL tablet Place 0.4 mg under the tongue every 5 (five) minutes as needed for chest pain.   Yes Historical Provider, MD  potassium chloride SA (K-DUR,KLOR-CON) 20 MEQ tablet Take 20 mEq by mouth every morning.    Yes Historical Provider, MD  spironolactone (ALDACTONE) 50 MG tablet Take 1 tablet (50 mg total) by mouth daily. Patient taking differently: Take 50 mg by mouth 2 (two) times daily.  01/02/16  Yes Thurnell Lose, MD  thiamine 100 MG tablet Take 1 tablet (100 mg total) by mouth daily. 01/02/16  Yes Thurnell Lose, MD  traMADol (ULTRAM) 50 MG tablet Take 50 mg by mouth every 6 (six) hours as needed (for pain).   Yes Historical Provider, MD  lactulose (CHRONULAC) 10 GM/15ML solution Take 30 mLs (20 g total) by mouth daily. Patient not taking: Reported on 03/29/2017 01/26/16   Florencia Reasons, MD  omeprazole (PRILOSEC) 40 MG capsule Take 1 capsule (40 mg total) by mouth every evening. Patient not taking: Reported on  03/29/2017 01/02/16   Thurnell Lose, MD  ranitidine (ZANTAC) 150 MG tablet Take 150 mg by mouth 2 (two) times daily.    Historical Provider, MD  rifaximin (XIFAXAN) 550 MG TABS tablet Take 1 tablet (550 mg total) by mouth 2 (two) times daily. Patient not taking: Reported on 03/29/2017 01/26/16   Florencia Reasons, MD     Vital Signs: BP (!) 101/55   Pulse 97   Temp 98.2 F (36.8 C) (Axillary)   Resp 14   Ht 5\' 11"  (1.803 m)   Wt 200 lb 6.4 oz (90.9 kg)   SpO2 100%   BMI 27.95 kg/m   Physical Exam  Alert, no distress Abdomen: soft, non-tender, no vomiting, dark bloody bowel movement this AM.   Imaging: Dg Chest 2 View  Result Date: 03/29/2017 CLINICAL DATA:  66 year old male with cough. EXAM: CHEST  2 VIEW COMPARISON:  Chest radiograph dated 12/29/2015 FINDINGS: There is emphysematous changes of the lungs with mild interstitial coarsening. No focal consolidation, pleural effusion, or pneumothorax. The cardiac silhouette is within normal limits. There is degenerative changes of the spine. No acute osseous pathology. IMPRESSION: No active cardiopulmonary disease. COPD. Electronically Signed   By: Anner Crete M.D.   On: 03/29/2017 23:38   Nm Gi Blood Loss  Result Date: 03/30/2017 CLINICAL DATA:  66 year old with acute GI bleeding. EXAM: NUCLEAR MEDICINE GASTROINTESTINAL BLEEDING SCAN TECHNIQUE: Sequential abdominal images were  obtained following intravenous administration of Tc-26m labeled red blood cells. RADIOPHARMACEUTICALS:  26.4 mCi Tc-66m in-vitro labeled red cells. COMPARISON:  None FINDINGS: The study is positive for a GI bleed. Localization of the bleeding is difficult. There is activity crossing the midline but the most activity is seen in the right abdomen. The configuration of the uptake in the right abdomen raises concern for small bowel. Without cross-sectional imaging, it is hard to know if this activity is possibly involving the transverse colon. IMPRESSION: Positive for GI  bleeding. Difficult to localize the source of the bleeding. Findings are concerning for blood in the small bowel. These results were called by telephone at the time of interpretation on 03/30/2017 at 10:52 am to Dr. Velna Ochs, who verbally acknowledged these results. Electronically Signed   By: Markus Daft M.D.   On: 03/30/2017 10:54   Ct Angio Abdomen Pelvis  W &/or Wo Contrast  Result Date: 03/30/2017 CLINICAL DATA:  66 year old male with GI bleeding. Endoscopy demonstrated large gastroesophageal varices without active bleeding. Tagged nuclear medicine study demonstrated GI bleeding which may be originating from the small bowel. Additional imaging is needed to localize bleeding source. EXAM: CT ANGIOGRAPHY ABDOMEN AND PELVIS WITH CONTRAST AND WITHOUT CONTRAST TECHNIQUE: Multidetector CT imaging of the abdomen and pelvis was performed using the standard protocol during bolus administration of intravenous contrast. Multiplanar reconstructed images and MIPs were obtained and reviewed to evaluate the vascular anatomy. CONTRAST:  100 mL Isovue 370 COMPARISON:  Tagged red blood cell study 03/30/2017. Ultrasound 11/13/2016 FINDINGS: VASCULAR Aorta: Atherosclerotic calcifications in the abdominal aorta with ectasia in the infrarenal abdominal aorta. Maximum size of the abdominal aorta measures 2.6 cm. Negative for an aortic dissection. Celiac: Celiac trunk is patent without significant stenosis. Incidentally, the left hepatic artery originates from the left gastric artery which is a normal variant. Atherosclerotic calcifications in the splenic artery without aneurysm. Small branch originating directly from the aorta near the celiac trunk may represent the pancreatic magna branch. SMA: SMA is patent with mild atherosclerotic disease. No significant occlusive disease. No evidence for active GI bleeding or vascular malformation. Renals: Mild atherosclerotic disease in the renal arteries without significant stenosis.  IMA: IMA is patent without clear evidence of active GI bleeding or vascular malformation. Inflow: Atherosclerotic disease in the iliac arteries without significant stenosis. Proximal Outflow: Proximal femoral arteries are patent with mild atherosclerotic disease. Veins: Portal venous system and SMA are patent. Right renal vein is patent. Evidence for a circumaortic left renal vein. The anterior left renal vein is associated with a gastrorenal shunt. There are large varices at the gastric cardia and fundus which also involve the distal esophagus. Gastric varices appear to be primarily supplied by the posterior gastric vein which is well demonstrated on series 12, image 48. Splenic vein proximal to the posterior gastric vein is small but patent. No evidence for portal vein thrombosis. Hepatic veins are patent. IVC is patent. Iliac veins are patent. Review of the MIP images confirms the above findings. NON-VASCULAR Lower chest: No pleural effusions. 4 mm calcified granuloma at the left lung base. Few peripheral patchy densities in left lower lobe probably represent atelectasis or mild scarring. Comma shaped opacity in the left lower lobe on sequence 13, image 3 measures roughly 5 mm and this is nonspecific but could be postinflammatory. Hepatobiliary: Liver is diffusely nodular and compatible with cirrhosis. Small amount of perihepatic ascites. No suspicious liver lesions. Tiny hypodensity in the right hepatic lobe on sequence 4, image  47 is too small to definitively characterize. Gallbladder appears to be edematous likely secondary to the cirrhosis. No biliary dilatation. Pancreas: Normal appearance of the pancreas without inflammation or duct dilatation. Spleen: Spleen is prominent for size. Small amount of perisplenic ascites. Adrenals/Urinary Tract: Normal adrenals. Urinary bladder appears normal. Normal appearance of both kidneys. No hydronephrosis. No suspicious renal lesions. Stomach/Bowel: Large varices  surrounding the distal esophagus and gastric fundus. Negative for bowel obstruction or bowel dilatation. Extensive diverticulosis involving the sigmoid colon. No evidence for acute diverticulitis. Normal appendix. No active GI bleeding identified. Lymphatic: Poorly defined lymph nodes in the porta hepatis region. There is diffuse edema throughout the gastrohepatic ligament and within the central abdominal mesentery. Reproductive: Prostate is unremarkable. Other: Trace ascites in the pelvis. Small amount of fluid surrounding the liver and spleen. Musculoskeletal: Disc space narrowing at L5-S1. No acute bone abnormality. 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 Gastric varices appear to be supplied by the posterior gastric vein. Portal vein is patent. Atherosclerotic disease throughout the abdomen and pelvis without significant occlusive disease. No evidence for active GI bleeding or vascular malformation. NON-VASCULAR Cirrhosis with portal hypertension. A small amount of ascites and prominent gastroesophageal varices. Diffuse mesenteric edema likely secondary to the cirrhosis. Gallbladder appears to be edematous and likely secondary to cirrhosis. Few irregular small nodular densities at the lung bases which are probably inflammatory or postinflammatory etiology. Largest measures 5 mm. No follow-up needed if patient is low-risk (and has no known or suspected primary neoplasm). Non-contrast chest CT can be considered in 12 months if patient is high-risk. This recommendation follows the consensus statement: Guidelines for Management of Incidental Pulmonary Nodules Detected on CT Images: From the Fleischner Society 2017; Radiology 2017; 284:228-243. Electronically Signed   By: Markus Daft M.D.   On: 03/30/2017 18:34    Labs:  CBC:  Recent Labs  03/29/17 1634 03/29/17 2208 03/30/17 0228 03/30/17 0728 03/30/17 1438 03/30/17 2150 03/31/17 0352  WBC 13.6* 14.6*  18.3*  --   --   --   --   HGB 12.4* 9.9* 10.0* 7.8* 7.3* 8.9* 8.4*  HCT 36.7* 29.5* 30.0* 23.0* 21.3* 26.1* 24.1*  PLT 130* 112* 128*  --   --   --   --     COAGS:  Recent Labs  03/29/17 1855 03/31/17 0352  INR 1.34 1.32    BMP:  Recent Labs  03/29/17 1634 03/29/17 2208 03/30/17 0228 03/31/17 0352  NA 135 135 135 139  K 6.1* 5.0 4.4 4.4  CL 107 107 109 113*  CO2 22 19* 19* 18*  GLUCOSE 154* 149* 153* 132*  BUN 29* 34* 29* 29*  CALCIUM 9.2 8.4* 8.3* 7.7*  CREATININE 0.92 0.91 0.93 1.05  GFRNONAA >60 >60 >60 >60  GFRAA >60 >60 >60 >60    LIVER FUNCTION TESTS:  Recent Labs  03/29/17 1634 03/30/17 0228  BILITOT 1.3* 1.1  AST 32 33  ALT 27 23  ALKPHOS 86 70  PROT 6.0* 5.0*  ALBUMIN 3.4* 3.0*    Assessment and Plan: GI bleed No significant bleeding since EDG yesterday.  Source of GI bleeding remains unclear.   Large gastric varices based on EDG and CTA but this does not appear to be source of the recent GI bleeding.   Discussed case with Dr. Anselm Pancoast.  Patient is a candidate for BRTO procedure once his medical status has improved and he is stable from his acute bleeding.   Will work with GI  on candidacy and timing of procedure as patient progresses.   Electronically Signed: Docia Barrier 03/31/2017, 11:30 AM   I spent a total of 15 Minutes at the the patient's bedside AND on the patient's hospital floor or unit, greater than 50% of which was counseling/coordinating care for Gastric varices

## 2017-03-31 NOTE — Progress Notes (Signed)
Pt had another bloody BM. Will continue to monitor closely and update as needed.

## 2017-03-31 NOTE — Progress Notes (Signed)
At [place] patient called out to RN asking for help adjusting covers. As RN entered the room, John Warren immediately stated he was beginning to have 8/10 chest pain/pressure. On continuous ECG monitoring RN noticed ST depression. 12-lead EKG obtained ASAP, showing sinus tachycardia (rate100s) with ST depression but unable to capture the pronounced abnormality on saved EKG strip as it resolved as soon as SL nitro given. Dr. Wynetta Emery with teaching svc, paged and made aware. Phlebotomy notified to draw 2nd troponin ASAP. RN asked for cardiology to be consulted, MD to consult with his resident. John Warren reports near resolution of chest pain after 0.4mg  SL given at this time, rating it a 1/10. Will continue to monitor closely.

## 2017-03-31 NOTE — Progress Notes (Signed)
Paged about patient having an episode of intense chest pain and pressure lasting approx 7 minutes that was relieved by subinguinal nitro. RN observed ST changes on monitor and concern for significant changes on EKG. Patient with no chest pain currently after receiving nitro x1. Second troponin being drawn. Patient is a cirrhotic with active GI bleed currently and medications such as Aspirin and Heparin are really not an option.   Discussed case with Dr. Koleen Nimrod of cardiology who reviewed his EKG and advised we continue maintain adequate Hgb with transfusion, trend troponins, and obtain serial EKG. Will continue prn Nitro as well.   I noted that patient's IV fluids had fallen off and given his persistent hypotension/tachycardia restarted NS at 125 cc/hr. Ordered repeat EKG for 530 AM.

## 2017-03-31 NOTE — Progress Notes (Signed)
Subjective:  Patient reported left-sided chest pain overnight which was relieved with sublingual nitroglycerin. He is not aware of any heart problems or previous heart attack as far as he knows. EKG overnight showed ST depressions in anterolateral leads. The night team did briefly discuss the case with Cardiology who recommended monitoring serial Troponins and EKGs. Patient states he has had one bowel movement since his endoscopy yesterday. He says this was bloody in appearance. Previously he was having bloody bowel movements every 2-3 hours. He wants to try to advance his diet to clear liquids. He denies any current chest pain, dyspnea, lightheadedness, dizziness.   Objective:  Vital signs in last 24 hours: Vitals:   03/31/17 0700 03/31/17 0715 03/31/17 0830 03/31/17 0900  BP: (!) 96/54 (!) 103/45 (!) 97/56 (!) 101/55  Pulse: 98 91 87 97  Resp: 14 12 14 14   Temp:   98.2 F (36.8 C)   TempSrc:   Axillary   SpO2: 99% 100% 100% 100%  Weight:      Height:       Physical Exam General: resting in bed, no acute distress Cardiac: RRR, 3/6 holosystolic murmur loudest at the left lower sternal border Pulm: clear to auscultation bilaterally, moving normal volumes of air Abd: soft, nontender, nondistended, BS present Ext: warm and well perfused, no pedal edema Neuro: alert and oriented X3   Assessment/Plan:  Patient is a 66 year old male with a past medical history of cirrhosis, prior alcohol abuse (sober for the past year), HFpEF, 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   Acute GI Bleed: Bleeding appears to be coming from the small bowel versus possible diverticular bleed. Tagged RBC scan was positive for bleed but difficult to localize source. EGD performed by Dr. Oletta Lamas did not show active bleeding but did visualize gastric varices and an AVM in the stomach. Octreotide infusion was started  4/28 to slow the bleeding. CT angiogram of the abdomen pelvis yesterday did not show active GI bleeding but did note prominent gastroesophageal varices with a large gastrorenal shunt and patent portal vein.  -- GI following, appreciate assistance -- Continue Octreotide gtt -- Continue IV Protonix 40 mg BID -- Continue IVFs NS @ 125 mL/hr -- Monitor H&H q8h  -- Transfuse prn to maintain hgb > 8.0  -- Diet advanced to clears  Chest Pain: His pain resolved with SL NTG. No formal ischemic workup in the system that I could find. He does have aortic atherosclerosis seen on CT imaging. Troponin has trended 0.06 > 0.06 > 0.05. EKG overnight did show ST depression in the anterolateral leads which has now normalized on repeat EKG this morning. I suspect this is demand ischemia related to his anemia, however formal ischemic workup should be considered once his acute GI issues stabilize. -- Hold off on antiplatelets with GI bleed -- Continue sublingual NTG as needed  Leukocytosis: Patient is afebrile, had a leukocytosis of 18.3 on admission. He endorsed cough x 1 week but CXR was negative for consolidation or infiltrate. In the setting of his acute GI bleed, etiology could certainly be GI. Patient was started on IV ceftriaxone.  -- F/u 4/28 blood cultures  -- Continue IV ceftriaxone   Hx HFpEF: -- Hold furosemide -- Daily Weights & Strict I/Os to monitor volume status  Hx Cirrhosis: -- Hold spironolactone -- Lactulose 10 mg BID resumed by GI  DVT/PE prophylaxis: SCDs FEN/GI: Clear  liquids Code: Full code   Dispo: Anticipated discharge pending clinical course.    Zada Finders, MD 03/31/2017, 9:57 AM

## 2017-03-31 NOTE — Progress Notes (Signed)
[facility] PROGRESS NOTE Subjective patient had a brief episodes of intense chest pain lasted several minutes early this morning resolved quickly with nitroglycerin. He is still not getting much in the way of IV fluids for unclear reasons but his blood pressure and pulse are much better today. He still NPO except for ice chips. He notes that he had one bowel movement about the time we finished the EGD yesterday and with the whole night with out bowel movement and had a large bowel movement about an hour ago with dark material. CT angiogram did not show active bleeding but showed large gastric varices and cirrhosis  Objective: Vital signs in last 24 hours: Temp:  [97.9 F (36.6 C)-99.1 F (37.3 C)] 98.2 F (36.8 C) (04/29 0830) Pulse Rate:  [87-120] 87 (04/29 0830) Resp:  [7-32] 14 (04/29 0830) BP: (64-150)/(40-113) 97/56 (04/29 0830) SpO2:  [89 %-100 %] 100 % (04/29 0830) Last BM Date: 03/30/17  Intake/Output from previous day: 04/28 0701 - 04/29 0700 In: 4225.1 [I.V.:1770; Blood:2405.1; IV Piggyback:50] Out: 450 [Urine:450] Intake/Output this shift: No intake/output data recorded.  PE: General-- alert and oriented no obvious distress watching television  Abdomen-- soft and completely nontender  Lab Results:  Recent Labs  03/29/17 1634 03/29/17 2208 03/30/17 0228 03/30/17 0728 03/30/17 1438 03/30/17 2150 03/31/17 0352  WBC 13.6* 14.6* 18.3*  --   --   --   --   HGB 12.4* 9.9* 10.0* 7.8* 7.3* 8.9* 8.4*  HCT 36.7* 29.5* 30.0* 23.0* 21.3* 26.1* 24.1*  PLT 130* 112* 128*  --   --   --   --    BMET  Recent Labs  03/29/17 1634 03/29/17 2208 03/30/17 0228 03/31/17 0352  NA 135 135 135 139  K 6.1* 5.0 4.4 4.4  CL 107 107 109 113*  CO2 22 19* 19* 18*  CREATININE 0.92 0.91 0.93 1.05   LFT  Recent Labs  03/29/17 1634 03/30/17 0228  PROT 6.0* 5.0*  AST 32 33  ALT 27 23  ALKPHOS 86 70  BILITOT 1.3* 1.1   PT/INR  Recent Labs  03/29/17 1855  03/31/17 0352  LABPROT 16.7* 16.5*  INR 1.34 1.32   PANCREAS  Recent Labs  03/29/17 1855  LIPASE 31         Studies/Results: Dg Chest 2 View  Result Date: 03/29/2017 CLINICAL DATA:  66 year old male with cough. EXAM: CHEST  2 VIEW COMPARISON:  Chest radiograph dated 12/29/2015 FINDINGS: There is emphysematous changes of the lungs with mild interstitial coarsening. No focal consolidation, pleural effusion, or pneumothorax. The cardiac silhouette is within normal limits. There is degenerative changes of the spine. No acute osseous pathology. IMPRESSION: No active cardiopulmonary disease. COPD. Electronically Signed   By: Anner Crete M.D.   On: 03/29/2017 23:38   Nm Gi Blood Loss  Result Date: 03/30/2017 CLINICAL DATA:  66 year old with acute GI bleeding. EXAM: NUCLEAR MEDICINE GASTROINTESTINAL BLEEDING SCAN TECHNIQUE: Sequential abdominal images were obtained following intravenous administration of Tc-23m labeled red blood cells. RADIOPHARMACEUTICALS:  26.4 mCi Tc-11m in-vitro labeled red cells. COMPARISON:  None FINDINGS: The study is positive for a GI bleed. Localization of the bleeding is difficult. There is activity crossing the midline but the most activity is seen in the right abdomen. The configuration of the uptake in the right abdomen raises concern for small bowel. Without cross-sectional imaging, it is hard to know if this activity is possibly involving the transverse colon. IMPRESSION: Positive for GI bleeding. Difficult to localize the  source of the bleeding. Findings are concerning for blood in the small bowel. These results were called by telephone at the time of interpretation on 03/30/2017 at 10:52 am to Dr. Velna Ochs, who verbally acknowledged these results. Electronically Signed   By: Markus Daft M.D.   On: 03/30/2017 10:54   Ct Angio Abdomen Pelvis  W &/or Wo Contrast  Result Date: 03/30/2017 CLINICAL DATA:  66 year old male with GI bleeding. Endoscopy  demonstrated large gastroesophageal varices without active bleeding. Tagged nuclear medicine study demonstrated GI bleeding which may be originating from the small bowel. Additional imaging is needed to localize bleeding source. EXAM: CT ANGIOGRAPHY ABDOMEN AND PELVIS WITH CONTRAST AND WITHOUT CONTRAST TECHNIQUE: Multidetector CT imaging of the abdomen and pelvis was performed using the standard protocol during bolus administration of intravenous contrast. Multiplanar reconstructed images and MIPs were obtained and reviewed to evaluate the vascular anatomy. CONTRAST:  100 mL Isovue 370 COMPARISON:  Tagged red blood cell study 03/30/2017. Ultrasound 11/13/2016 FINDINGS: VASCULAR Aorta: Atherosclerotic calcifications in the abdominal aorta with ectasia in the infrarenal abdominal aorta. Maximum size of the abdominal aorta measures 2.6 cm. Negative for an aortic dissection. Celiac: Celiac trunk is patent without significant stenosis. Incidentally, the left hepatic artery originates from the left gastric artery which is a normal variant. Atherosclerotic calcifications in the splenic artery without aneurysm. Small branch originating directly from the aorta near the celiac trunk may represent the pancreatic magna branch. SMA: SMA is patent with mild atherosclerotic disease. No significant occlusive disease. No evidence for active GI bleeding or vascular malformation. Renals: Mild atherosclerotic disease in the renal arteries without significant stenosis. IMA: IMA is patent without clear evidence of active GI bleeding or vascular malformation. Inflow: Atherosclerotic disease in the iliac arteries without significant stenosis. Proximal Outflow: Proximal femoral arteries are patent with mild atherosclerotic disease. Veins: Portal venous system and SMA are patent. Right renal vein is patent. Evidence for a circumaortic left renal vein. The anterior left renal vein is associated with a gastrorenal shunt. There are large  varices at the gastric cardia and fundus which also involve the distal esophagus. Gastric varices appear to be primarily supplied by the posterior gastric vein which is well demonstrated on series 12, image 48. Splenic vein proximal to the posterior gastric vein is small but patent. No evidence for portal vein thrombosis. Hepatic veins are patent. IVC is patent. Iliac veins are patent. Review of the MIP images confirms the above findings. NON-VASCULAR Lower chest: No pleural effusions. 4 mm calcified granuloma at the left lung base. Few peripheral patchy densities in left lower lobe probably represent atelectasis or mild scarring. Comma shaped opacity in the left lower lobe on sequence 13, image 3 measures roughly 5 mm and this is nonspecific but could be postinflammatory. Hepatobiliary: Liver is diffusely nodular and compatible with cirrhosis. Small amount of perihepatic ascites. No suspicious liver lesions. Tiny hypodensity in the right hepatic lobe on sequence 4, image 47 is too small to definitively characterize. Gallbladder appears to be edematous likely secondary to the cirrhosis. No biliary dilatation. Pancreas: Normal appearance of the pancreas without inflammation or duct dilatation. Spleen: Spleen is prominent for size. Small amount of perisplenic ascites. Adrenals/Urinary Tract: Normal adrenals. Urinary bladder appears normal. Normal appearance of both kidneys. No hydronephrosis. No suspicious renal lesions. Stomach/Bowel: Large varices surrounding the distal esophagus and gastric fundus. Negative for bowel obstruction or bowel dilatation. Extensive diverticulosis involving the sigmoid colon. No evidence for acute diverticulitis. Normal appendix. No active  GI bleeding identified. Lymphatic: Poorly defined lymph nodes in the porta hepatis region. There is diffuse edema throughout the gastrohepatic ligament and within the central abdominal mesentery. Reproductive: Prostate is unremarkable. Other: Trace  ascites in the pelvis. Small amount of fluid surrounding the liver and spleen. Musculoskeletal: Disc space narrowing at L5-S1. No acute bone abnormality. 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 Gastric varices appear to be supplied by the posterior gastric vein. Portal vein is patent. Atherosclerotic disease throughout the abdomen and pelvis without significant occlusive disease. No evidence for active GI bleeding or vascular malformation. NON-VASCULAR Cirrhosis with portal hypertension. A small amount of ascites and prominent gastroesophageal varices. Diffuse mesenteric edema likely secondary to the cirrhosis. Gallbladder appears to be edematous and likely secondary to cirrhosis. Few irregular small nodular densities at the lung bases which are probably inflammatory or postinflammatory etiology. Largest measures 5 mm. No follow-up needed if patient is low-risk (and has no known or suspected primary neoplasm). Non-contrast chest CT can be considered in 12 months if patient is high-risk. This recommendation follows the consensus statement: Guidelines for Management of Incidental Pulmonary Nodules Detected on CT Images: From the Fleischner Society 2017; Radiology 2017; 284:228-243. Electronically Signed   By: Markus Daft M.D.   On: 03/30/2017 18:34    Medications: I have reviewed the patient's current medications.  Assessment/Plan: 1. G.I. bleeding. The exact source remains somewhat elusive. G.I. bleeding scan suggested possible small bowel bleed although clinically it appeared most consistent with diverticular bleeding. Patient has had extensive diverticulosis on previous colonoscopies. EGD showed what appeared to be very large gastric varices compared to reports of previous EGD's but no gross esophageal varices and no suggestion at all of upper G.I. bleeding. In spite of this, we went ahead and put him on octreotide infusion and obtained a CT angiogram with  special study by Dr. Anselm Pancoast to evaluate gastric varices. This showed very prominent gastric varices with a large gastrorenal shunt. The portal vein was patent. There was no evidence on the CT angiogram of active G.I. bleeding. Minimal ascites were seen in the patient clearly had cirrhosis with signs of portal hypertension. At this point the exact cause and location of his bleeding remains elusive but it appears to have stopped. We will go ahead and start him on clear liquids and I will give him some lactulose as well in order to attempt to clean out his colon. We will follow him. I think he will need colonoscopy this admission. We'll keep him on octreotide for the next several days. 2. Chest pain. This is resolved with nitroglycerin. The patient has had a history of some hard issues in the past but I don't really see where he said previous catheterization that I can find in the electronic record. He's a symptomatic now but may need to be seen by cardiology bleeding issues have been resolved.   John Warren,John Warren 03/31/2017, 9:00 AM  This note was created using voice recognition software. Minor errors may Have occurred unintentionally.  Pager: [phone] If no answer or after hours call [phone]

## 2017-03-31 NOTE — Progress Notes (Signed)
Pt c/o of CP 8/10 after bloody BM.  1 slngt given with full resolution of pain.  Stat ekg obtained.  See flowsheet for further documentation.  MD notified.

## 2017-03-31 NOTE — Progress Notes (Signed)
Internal Medicine Attending:   I saw and examined the patient. I reviewed the resident's note and I agree with the resident's findings and plan as documented in the resident's note. John Warren feels much better this morning.  Last bloody bowel movement was yesterday, did have dark BM this morning, he did have some chest pain last night, mild EKG changes and mild troponin.  I instructed our residents to continue to transfuse blood while symptomatic and then maintain Hgb >8 but this is demand ischemia.  No ASA or Heparin as he is actively bleeding.  Once he is stable we could consider a formal cardiology evaluation. For now we will continue octreotide-likely for 72 hours given he has gastric varices, will also continue the ceftriaxone in setting of GI bleed in cirrhosis and leukocytosis.  GI thinks he will likely need colonoscopy prior to discharge.

## 2017-04-01 ENCOUNTER — Encounter (HOSPITAL_COMMUNITY): Payer: Self-pay | Admitting: Gastroenterology

## 2017-04-01 ENCOUNTER — Inpatient Hospital Stay (HOSPITAL_COMMUNITY): Payer: Medicare Other

## 2017-04-01 DIAGNOSIS — Q2733 Arteriovenous malformation of digestive system vessel: Secondary | ICD-10-CM

## 2017-04-01 LAB — COMPREHENSIVE METABOLIC PANEL
ALT: 18 U/L (ref 17–63)
ANION GAP: 4 — AB (ref 5–15)
AST: 26 U/L (ref 15–41)
Albumin: 2.3 g/dL — ABNORMAL LOW (ref 3.5–5.0)
Alkaline Phosphatase: 52 U/L (ref 38–126)
BILIRUBIN TOTAL: 0.7 mg/dL (ref 0.3–1.2)
BUN: 13 mg/dL (ref 6–20)
CO2: 20 mmol/L — ABNORMAL LOW (ref 22–32)
Calcium: 7.4 mg/dL — ABNORMAL LOW (ref 8.9–10.3)
Chloride: 111 mmol/L (ref 101–111)
Creatinine, Ser: 0.78 mg/dL (ref 0.61–1.24)
Glucose, Bld: 130 mg/dL — ABNORMAL HIGH (ref 65–99)
POTASSIUM: 3.8 mmol/L (ref 3.5–5.1)
SODIUM: 135 mmol/L (ref 135–145)
TOTAL PROTEIN: 4.2 g/dL — AB (ref 6.5–8.1)

## 2017-04-01 LAB — HEMOGLOBIN AND HEMATOCRIT, BLOOD
HCT: 25.2 % — ABNORMAL LOW (ref 39.0–52.0)
HCT: 28.4 % — ABNORMAL LOW (ref 39.0–52.0)
HEMOGLOBIN: 8.6 g/dL — AB (ref 13.0–17.0)
HEMOGLOBIN: 9.5 g/dL — AB (ref 13.0–17.0)

## 2017-04-01 LAB — CBC
HEMATOCRIT: 24.8 % — AB (ref 39.0–52.0)
HEMOGLOBIN: 8.5 g/dL — AB (ref 13.0–17.0)
MCH: 30.7 pg (ref 26.0–34.0)
MCHC: 34.3 g/dL (ref 30.0–36.0)
MCV: 89.5 fL (ref 78.0–100.0)
Platelets: 79 10*3/uL — ABNORMAL LOW (ref 150–400)
RBC: 2.77 MIL/uL — ABNORMAL LOW (ref 4.22–5.81)
RDW: 16.8 % — ABNORMAL HIGH (ref 11.5–15.5)
WBC: 13.8 10*3/uL — AB (ref 4.0–10.5)

## 2017-04-01 MED ORDER — FUROSEMIDE 80 MG PO TABS
80.0000 mg | ORAL_TABLET | Freq: Every day | ORAL | Status: DC
  Administered 2017-04-01 – 2017-04-03 (×3): 80 mg via ORAL
  Filled 2017-04-01 (×3): qty 1

## 2017-04-01 NOTE — Progress Notes (Signed)
Subjective: Some blood in stool last night, none since. No abdominal pain. Some swelling and short-ness of breath.  Objective: Vital signs in last 24 hours: Temp:  [97.2 F (36.2 C)-98.9 F (37.2 C)] 97.4 F (36.3 C) (04/30 1302) Pulse Rate:  [80-108] 108 (04/30 1302) Resp:  [13-24] 24 (04/30 1302) BP: (93-150)/(40-79) 125/62 (04/30 1302) SpO2:  [92 %-100 %] 96 % (04/30 1302) Weight:  [99 kg (218 lb 4.1 oz)] 99 kg (218 lb 4.1 oz) (04/30 0358) Weight change:  Last BM Date: 03/31/17  PE: GEN:  Chronically ill-appearing, NAD, mild tachypnea ABD:  Mild protuberant, non-tender EXT:  2+ pitting edema bilateral lower extremities  Lab Results: CBC    Component Value Date/Time   WBC 13.8 (H) 04/01/2017 0301   RBC 2.77 (L) 04/01/2017 0301   HGB 8.6 (L) 04/01/2017 1117   HCT 25.2 (L) 04/01/2017 1117   PLT 79 (L) 04/01/2017 0301   MCV 89.5 04/01/2017 0301   MCH 30.7 04/01/2017 0301   MCHC 34.3 04/01/2017 0301   RDW 16.8 (H) 04/01/2017 0301   LYMPHSABS 2.3 03/29/2017 2208   MONOABS 1.8 (H) 03/29/2017 2208   EOSABS 0.1 03/29/2017 2208   BASOSABS 0.0 03/29/2017 2208   CMP     Component Value Date/Time   NA 135 04/01/2017 0301   K 3.8 04/01/2017 0301   CL 111 04/01/2017 0301   CO2 20 (L) 04/01/2017 0301   GLUCOSE 130 (H) 04/01/2017 0301   BUN 13 04/01/2017 0301   CREATININE 0.78 04/01/2017 0301   CALCIUM 7.4 (L) 04/01/2017 0301   PROT 4.2 (L) 04/01/2017 0301   ALBUMIN 2.3 (L) 04/01/2017 0301   AST 26 04/01/2017 0301   ALT 18 04/01/2017 0301   ALKPHOS 52 04/01/2017 0301   BILITOT 0.7 04/01/2017 0301   GFRNONAA >60 04/01/2017 0301   GFRAA >60 04/01/2017 0301   Assessment:  1.  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  Doubtful that this has anything to do with patient's ongoing GI bleeding. 2.  Acute on chronic anemia. 3.  Hematochezia.  Negative endoscopy.  Consider diverticulosis, AVMs. 4.  Shortness of breath with clinical evidence of volume  overload.  Plan:  1.  Clear liquids. 2.  Consider capsule endoscopy tomorrow (if available) as next step to evaluate source of bleeding, specifically to look for small bowel AVMs. 3.  If capsule endoscopy unrevealing, might have to consider colonoscopy, but would need volume overload improved prior to consideration of putting him through the rigors of bowel preparation as well as anesthesia. 4.  Follow CBCs, limit anticoagulants/antiplatelet agents as clinically feasible. 5.  [facility] will follow.   OCIE, TINO 04/01/2017, 1:14 PM   Pager [phone] If no answer or after 5 PM call [phone]

## 2017-04-01 NOTE — Progress Notes (Signed)
   04/01/17 0900  Clinical Encounter Type  Visited With Patient  Visit Type Initial  Referral From Nurse  Consult/Referral To Chaplain  Recommendations (follow up as needed)  Spiritual Encounters  Spiritual Needs Literature;Brochure  Stress Factors  Patient Stress Factors None identified  Advance Directives (For Healthcare)  Does Patient Have a Medical Advance Directive? No  Would patient like information on creating a medical advance directive? Yes (Inpatient - patient requests chaplain consult to create a medical advance directive)  Pt requested AD forms, gave a brief overview of the forms.  Pt will contact pastoral services when ready to complete, wants to discuss with fiance first.  Chaplain Niki Cosman A.Eoghan Belcher [phone]

## 2017-04-01 NOTE — Progress Notes (Signed)
Paged overnight about O2 sats. Per nursing he had a brief period where he de-sated around 87-88% while sleeping. He was placed on 2L nasal cannula with improvment in his O2 saturation. Given Hx of HFpEF fluids were discontinued. Chest x-ray was ordered. No significant pulmonary edema. Most likely 2/2 to COPD. Would wean oxygen this AM if still on Nasal Cannula.

## 2017-04-01 NOTE — Progress Notes (Signed)
Spoke to GI, Dr. Paulita Fujita, regarding possible BRTO.  The patient's bleeding does not appear to be related to his varices at this time.  Further work up is pending.  Based off of his CT scan, he is a candidate for a BRTO; however, GI did not feel this needed to be pursued at this time.  He may follow up with IR as an outpatient if felt indicated by GI in the future.  Please call with any questions.  Halena Mohar E 1:43 PM 04/01/2017

## 2017-04-01 NOTE — Progress Notes (Signed)
Internal Medicine Attending:   I saw and examined the patient. I reviewed the resident's note and I agree with the resident's findings and plan as documented in the resident's note.  Patient feels well this morning with no new complaints. He states his last bloody bowel movement was yesterday around 6 PM. His blood pressure remained stable today but he remains slightly tachycardic. He is status post EGD which did not show any active bleeding but he did have gastric varices and an AVM. CT angiogram of the abdomen and pelvis did not show an active GI bleeding either but did note prominent gastroesophageal varices with a large gastrorenal shunt and a patent portal vein. His hemoglobin decreased to 8.5 today from 9.2. Would continue to monitor his CBCs every 8 hours and transfuse him as necessary. Continue with octreotide drip for now as well as IV PPI twice a day.  Overnight patient was noted to have desaturation episode to 87-88% on room air. He was started on 2 L nasal cannula and his O2 sats remained in the mid to late 30s. Chest x-ray shows possible developing right lower lobe infiltrate but patient remains afebrile and his leukocytosis is improving. We'll continue ceftriaxone for now and repeat chest x-ray in the morning. If patient has worsening hypoxia would consider changing his antibiotics to pip/tazo to cover for possible aspiration.  Patient also been off his furosemide and spironolactone since admission and he may be developing some fluid overloaded especially given some trace lower extremity edema on exam. If his blood pressures remain stable today we'll resume his Lasix and monitor.

## 2017-04-01 NOTE — Progress Notes (Signed)
   Subjective: Patient feels well this morning. His last bloody BM was yesterday evening. Patient developed hypoxia overnight now requiring 2L [place]. CXR concerning for developing RLL PNA. Patient endorsed cough x 10 days prior to admission.   Objective:  Vital signs in last 24 hours: Vitals:   04/01/17 0500 04/01/17 0600 04/01/17 0700 04/01/17 0811  BP: (!) 112/58 (!) 93/48 (!) 94/51 114/60  Pulse: 90 80 83 (!) 105  Resp: 18 15 16 18   Temp:    97.2 F (36.2 C)  TempSrc:    Oral  SpO2: 95% 96% 97% 96%  Weight:      Height:       Physical Exam General: resting in bed, no acute distress Cardiac: RRR, 3/6 holosystolic murmur loudest at the left lower sternal border Pulm: clear to auscultation bilaterally, moving normal volumes of air Abd: soft, nontender, nondistended, BS present Ext: warm and well perfused, no pedal edema Neuro: alert and oriented X3  Assessment/Plan:  Patient is a 66 year old male with a past medical history of cirrhosis, prior alcohol abuse (sober for the past year), HFpEF, 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   Acute GI Bleed: Bleeding appears to be coming from the small bowel versus possible diverticular bleed. Tagged RBC scan was positive for bleed but difficult to localize source. EGD performed by Dr. Oletta Lamas did not show active bleeding but did visualize gastric varices and an AVM in the stomach. Octreotide infusion was started 4/28 to slow the bleeding. CT angiogram of the abdomen pelvis yesterday did not show active GI bleeding but did note prominent gastroesophageal varices with a large gastrorenal shunt and patent portal vein. Currently stable. Last bloody BM yesterday evening.  -- GI following, appreciate assistance -- Continue Octreotide gtt -- Continue IV Protonix 40 mg BID -- Fluids stopped -- Monitor H&H q8h  -- Transfuse prn to maintain hgb > 8.0  --  Diet advanced to clears  Hypoxia:Patient developed a new oxygen requirement overnight. Fluids were discontinued. CXR is concerning for a developing RLL PNA. Patient has remained afebrile but did have a leukocytosis of 23 yesterday, improved to 13 today. He was started on IV ceftriaxone on admission. If patient clinically deteriorates or fails to improve, low threshold to broaden coverage for aspiration PNA.  -- If hypoxia worsens or persist, consider changing Ceftriaxone to Unasyn or Zosyn -- F/u 4/28 Blood cultures >> NG x 1 day   -- Supplemental oxygen prn  -- Wean as tolerated -- IS  -- Out of bed to chair today   Chest Pain: Now resolved. Likely demand ischemia in the setting of his acute GI bleed. No formal ischemic workup in the system that I could find. He does have aortic atherosclerosis seen on CT imaging. Troponin has trended 0.06 > 0.06 > 0.05. Initial EKG did show ST depression in the anterolateral leads which normalized on repeat tracing. -- Hold off on antiplatelets with GI bleed -- Continue sublingual NTG as needed  Hx HFpEF: -- Hold furosemide -- Daily Weights & Strict I/Os to monitor volume status  Hx Cirrhosis: -- Hold spironolactone -- Lactulose 10 mg BID resumed by GI  DVT/PE prophylaxis:SCDs FEN/GI:Clear liquids Code:Full code   Dispo: Anticipated discharge in approximately 1-2 day(s).   Velna Ochs, MD 04/01/2017, 9:00 AM Pager: [phone]

## 2017-04-01 NOTE — Progress Notes (Signed)
Discussed possible transfer to tele bed; Dr. Benjamine Mola states patient may be able to transfer to tele bed tomorrow - desire to keep on SDU for another 24 hours to monitor for stable Hgb.

## 2017-04-01 NOTE — Progress Notes (Signed)
RN noticed 02 sats had decreased and were sustaining around 87-88%. After about 10 minutes when they didn't recover, RN woke patient up and asked him to take a few deep breaths. Sats didn't improve. RN assessed lung sounds and heard crackles in bases. RN applied 2L of 02 via  and decreased fluids to 50 ml/hr from 134ml/hr until MD calls RN back. MD paged and awaiting for orders. Sats improved to 96% on the 2L.Patient asymptomatic.  0215: Dr.Taylor returned page and ordered a stat chest x-ray and H&H. Will continue to monitor.

## 2017-04-02 ENCOUNTER — Encounter (HOSPITAL_COMMUNITY)
Admission: EM | Disposition: A | Discharge status: Home / Self Care | Payer: Self-pay | Source: Home / Self Care | Attending: Pulmonary Disease

## 2017-04-02 ENCOUNTER — Inpatient Hospital Stay (HOSPITAL_COMMUNITY): Payer: Medicare Other

## 2017-04-02 ENCOUNTER — Encounter (HOSPITAL_COMMUNITY): Payer: Self-pay

## 2017-04-02 DIAGNOSIS — Z9689 Presence of other specified functional implants: Secondary | ICD-10-CM

## 2017-04-02 DIAGNOSIS — Z79899 Other long term (current) drug therapy: Secondary | ICD-10-CM

## 2017-04-02 DIAGNOSIS — K921 Melena: Secondary | ICD-10-CM

## 2017-04-02 HISTORY — PX: GIVENS CAPSULE STUDY: SHX5432

## 2017-04-02 LAB — TYPE AND SCREEN
ABO/RH(D): O POS
Antibody Screen: NEGATIVE
Unit division: 0
Unit division: 0
Unit division: 0
Unit division: 0
Unit division: 0

## 2017-04-02 LAB — CBC
HEMATOCRIT: 23.6 % — AB (ref 39.0–52.0)
Hemoglobin: 7.9 g/dL — ABNORMAL LOW (ref 13.0–17.0)
MCH: 30.3 pg (ref 26.0–34.0)
MCHC: 33.5 g/dL (ref 30.0–36.0)
MCV: 90.4 fL (ref 78.0–100.0)
Platelets: 84 10*3/uL — ABNORMAL LOW (ref 150–400)
RBC: 2.61 MIL/uL — AB (ref 4.22–5.81)
RDW: 16.6 % — ABNORMAL HIGH (ref 11.5–15.5)
WBC: 8.8 10*3/uL (ref 4.0–10.5)

## 2017-04-02 LAB — BPAM RBC
BLOOD PRODUCT EXPIRATION DATE: 201805202359
BLOOD PRODUCT EXPIRATION DATE: 201805242359
BLOOD PRODUCT EXPIRATION DATE: 201805242359
Blood Product Expiration Date: 201805042359
Blood Product Expiration Date: 201805242359
ISSUE DATE / TIME: 201804280618
ISSUE DATE / TIME: 201804281443
ISSUE DATE / TIME: 201804281713
ISSUE DATE / TIME: 201804291433
UNIT TYPE AND RH: 5100
UNIT TYPE AND RH: 5100
UNIT TYPE AND RH: 5100
Unit Type and Rh: 5100
Unit Type and Rh: 5100

## 2017-04-02 LAB — BASIC METABOLIC PANEL
ANION GAP: 4 — AB (ref 5–15)
BUN: 9 mg/dL (ref 6–20)
CO2: 24 mmol/L (ref 22–32)
Calcium: 7.4 mg/dL — ABNORMAL LOW (ref 8.9–10.3)
Chloride: 106 mmol/L (ref 101–111)
Creatinine, Ser: 0.93 mg/dL (ref 0.61–1.24)
GFR calc Af Amer: >60 mL/min (ref >60)
GFR calc non Af Amer: >60 mL/min (ref >60)
Glucose, Bld: 121 mg/dL — ABNORMAL HIGH (ref 65–99)
POTASSIUM: 3.3 mmol/L — AB (ref 3.5–5.1)
Sodium: 134 mmol/L — ABNORMAL LOW (ref 135–145)

## 2017-04-02 LAB — HEMOGLOBIN AND HEMATOCRIT, BLOOD
HCT: 25.1 % — ABNORMAL LOW (ref 39.0–52.0)
Hemoglobin: 8.4 g/dL — ABNORMAL LOW (ref 13.0–17.0)

## 2017-04-02 SURGERY — IMAGING PROCEDURE, GI TRACT, INTRALUMINAL, VIA CAPSULE
Anesthesia: LOCAL

## 2017-04-02 MED ORDER — SODIUM CHLORIDE 0.9 % IV SOLN
30.0000 meq | Freq: Once | INTRAVENOUS | Status: AC
  Administered 2017-04-02: 30 meq via INTRAVENOUS
  Filled 2017-04-02: qty 15

## 2017-04-02 MED ORDER — SPIRONOLACTONE 25 MG PO TABS
25.0000 mg | ORAL_TABLET | Freq: Two times a day (BID) | ORAL | Status: DC
  Administered 2017-04-02 – 2017-04-03 (×3): 25 mg via ORAL
  Filled 2017-04-02 (×3): qty 1

## 2017-04-02 MED ORDER — DEXTROSE 5 % IV SOLN
1.0000 g | Freq: Every day | INTRAVENOUS | Status: AC
  Administered 2017-04-03: 1 g via INTRAVENOUS
  Filled 2017-04-02: qty 10

## 2017-04-02 MED ORDER — LACTULOSE 10 GM/15ML PO SOLN
10.0000 g | Freq: Two times a day (BID) | ORAL | Status: DC
  Administered 2017-04-02 – 2017-04-03 (×4): 10 g via ORAL
  Filled 2017-04-02 (×7): qty 15

## 2017-04-02 SURGICAL SUPPLY — 1 items: TOWEL COTTON PACK 4EA (MISCELLANEOUS) ×4 IMPLANT

## 2017-04-02 NOTE — Progress Notes (Signed)
Internal Medicine Attending:   I saw and examined the patient. I reviewed the resident's note and I agree with the resident's findings and plan as documented in the resident's note.  66 year old man with cirrhosis admitted with hematochezia and symptomatic blood loss anemia. Endoscopy performed did show varices but no stigmata of recent bleeding. Yesterday he had 2 bowel movements with only specks of blood. Hemoglobin has been stable. Doing well on octreotide and ceftriaxone empirically, likely we can discontinue this in the next 1 day. Currently undergoing endoscopy, I anticipate he does have small bowel telangiectasias likely related to his portal hypertension. Gastroenterology is following and may also consider colonoscopy this admission.

## 2017-04-02 NOTE — Progress Notes (Signed)
Small bowel capsule ingested by patient at [place].  Instructions given to patient and RN. Verb und.

## 2017-04-02 NOTE — Care Management Note (Signed)
Case Management Note  Patient Details  Name: DECLAN MIER MRN: [id number] Date of Birth: 1951/09/29  Subjective/Objective:    Pt admitted with GI bleed                Action/Plan:  PTA from home .  CM will continue to follow for discharge needs   Expected Discharge Date:                  Expected Discharge Plan:  Home/Self Care  In-House Referral:     Discharge planning Services  CM Consult  Post Acute Care Choice:    Choice offered to:     DME Arranged:    DME Agency:     HH Arranged:    HH Agency:     Status of Service:     If discussed at [employer] of Stay Meetings, dates discussed:    Additional Comments:  Maryclare Labrador, RN 04/02/2017, 2:36 PM

## 2017-04-02 NOTE — Progress Notes (Signed)
Patient Hgb 7.9 and BP MAP 55 to 66. Paged GI and they are aware.  Will continue to monitor.

## 2017-04-02 NOTE — Progress Notes (Signed)
Report called to [phone] and patient prepared for transfer. Patients belongings packed and transported with patient.

## 2017-04-02 NOTE — Progress Notes (Signed)
Subjective:   Patient and nursing report no further bloody or melenic bowel movements yesterday or overnight. Patient endorses some shortness of breath with walking (first time out of bed this admission was yesterday), but no further chest pain, shortness of breath at rest, dizziness, nausea, headache. He denies abdominal pain.   Per patient, he had low O2 sat to 84% overnight while lying down and was placed back on oxygen.  Objective:  Vital signs in last 24 hours: Vitals:   04/02/17 0400 04/02/17 0500 04/02/17 0550 04/02/17 0600  BP:   (!) 90/40   Pulse: 89 91 87 83  Resp: 18 17 17 15   Temp:      TempSrc:      SpO2: 93% 94% 98% 98%  Weight:      Height:       Constitutional: NAD CV: distant heart sounds, RRR, no murmurs, rubs or gallops appreciated; quad ext edema; with 2+ pitting LE L>R; distal pulses intact Resp: O2 sat 98% w/o supplemental oxygen; no increased work of breathing; diminished breath sounds throughout, no rales, rhonchi or wheezes appreciated Abd: distended, soft, +BS Skin: multiple SKs; R medial ankle with indurated and hyperpigmented skin  Neuro: Alert and oriented x3  Assessment/Plan:  Principal Problem:   GI bleeding Active Problems:   Cirrhosis, alcoholic (HCC)   AVM (arteriovenous malformation) of colon   Sigmoid diverticulosis   Portal hypertensive gastropathy (HCC)   Thrombocytopenia (HCC)  Acute GI Bleed:  Tagged RBC scan positive but unable to localize source; EGD without active bleeding (gastric varices and gastric AVM); CTA without active bleeding (gastroesophageal varices and large gastrorenal shunt). GI planning on capsule endoscopy today as likely bleeding coming form SB AVM's per their assessment; if nothing found, would likely pursue colonoscopy. BRTO to be considered likely outpatient once patient is stable. S/p 4U pRBCs and 2U FFPs. Hgb 7.9 this AM with patient asymptomatic and no further signs of bleeding so will hold off on  transfusion for right now. --GI following - appreciate their assistance --Capsule endoscopy today --Octreotide gtt day 4/5 --Ceftriaxone day 4/5 --IV Protonix 40mg  BID --continue monitoring H/H q8hrs --transfuse as needed to maintain hgb > 8.0 --diet was advanced to clears yesterday; restart after capsule   Hypoxia: Patient with new oxygen requirement this admission; O2 sat down to 84 per patient last night without symptoms. CXR today shows very small bil pleural effusions and basilar atelectasis; no rales on exam and no fevers. Patient does have COPD; no PFT's on record; other etiology is from his anemia. --goal O2 sat >88% --follow BCx - NGx2d --follow Hgbs --incentive spirometry  Chest pain: Patient had chest pain this admission with new and reversible EKG changes; Trops trended flat - likely from demand ischemia in setting of acute anemia. No further chest pain.  H/o dCHF: Patient on furosemide and spironolactone at home, which were held in setting of hypotension, tachycardia, and active bleeding. He progressively became volume overloaded. Furosemide was started 4/30 and his UOP was 3.6L. CXR shows mild congestion compared to admission; exam is negative for rales but positive for significant bil LE edema L>R and mild UE edema. --continue furosemide 80mg  daily  --restart lower dose spironolactone 25mg  BID (home dose is 50mg  BID); if BP tolerating well, can resume home dose tomorrow --replete K --strict ins/outs; daily weights  Cirrhosis: Patient with EtOH cirrhosis with gastric varices and large gastrorenal shunt. BRTO to be considered outpatient.  --furosemide 80mg  daily; spironolactone 25mg  BID --lactulose BID  Diet:  NPO for capsule endoscopy VTE ppx: SCD's Code: FULL  Dispo: Anticipated discharge in approximately 2-3 day(s).   Alphonzo Grieve, MD 04/02/2017, 7:27 AM IMTS - PGY1 Pager [phone]

## 2017-04-02 NOTE — Progress Notes (Signed)
Subjective: Shortness of breath improving. No abdominal pain. Some black stools.  No frank hematochezia or hematemesis.  Objective: Vital signs in last 24 hours: Temp:  [97.4 F (36.3 C)-99.2 F (37.3 C)] 98.1 F (36.7 C) (05/01 0852) Pulse Rate:  [80-108] 80 (05/01 0852) Resp:  [13-24] 13 (05/01 0852) BP: (90-129)/(40-70) 93/40 (05/01 0852) SpO2:  [93 %-100 %] 98 % (05/01 0852) Weight:  [97.6 kg (215 lb 2.7 oz)] 97.6 kg (215 lb 2.7 oz) (05/01 0336) Weight change: -1.4 kg (-3 lb 1.4 oz) Last BM Date: 03/31/17  PE: GEN:  NAD EXT:  Edematous HEENT: Not jaundiced, anicteric sclera SKIN:  Scattered ecchymoses  Lab Results: CBC    Component Value Date/Time   WBC 8.8 04/02/2017 0354   RBC 2.61 (L) 04/02/2017 0354   HGB 7.9 (L) 04/02/2017 0354   HCT 23.6 (L) 04/02/2017 0354   PLT 84 (L) 04/02/2017 0354   MCV 90.4 04/02/2017 0354   MCH 30.3 04/02/2017 0354   MCHC 33.5 04/02/2017 0354   RDW 16.6 (H) 04/02/2017 0354   LYMPHSABS 2.3 03/29/2017 2208   MONOABS 1.8 (H) 03/29/2017 2208   EOSABS 0.1 03/29/2017 2208   BASOSABS 0.0 03/29/2017 2208   CMP     Component Value Date/Time   NA 134 (L) 04/02/2017 0354   K 3.3 (L) 04/02/2017 0354   CL 106 04/02/2017 0354   CO2 24 04/02/2017 0354   GLUCOSE 121 (H) 04/02/2017 0354   BUN 9 04/02/2017 0354   CREATININE 0.93 04/02/2017 0354   CALCIUM 7.4 (L) 04/02/2017 0354   PROT 4.2 (L) 04/01/2017 0301   ALBUMIN 2.3 (L) 04/01/2017 0301   AST 26 04/01/2017 0301   ALT 18 04/01/2017 0301   ALKPHOS 52 04/01/2017 0301   BILITOT 0.7 04/01/2017 0301   GFRNONAA >60 04/02/2017 0354   GFRAA >60 04/02/2017 0354   Assessment:  1.  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  Unclear whether this has anything to do with patient's ongoing GI bleeding. 2.  Acute on chronic anemia. 3.  Hematochezia.  Negative endoscopy.  Consider diverticulosis, AVMs. 4.  Shortness of breath with clinical evidence of volume overload.  Plan:  1.  Capsule  endoscopy today. 2.  Will need pulmonary status to improve, should we ultimately decide to do colonoscopy. 3.  Would need cardiac clearance prior to pursuing colonoscopy or further more aggressive endoscopic intervention, given patient's chest pain this admission which resolved with NTG (which admittedly of course could be from anemia and supply demand mismatch). 4.  [facility] will follow.   John Warren, John Warren 04/02/2017, 10:35 AM   Pager [phone] If no answer or after 5 PM call [phone]

## 2017-04-02 NOTE — Progress Notes (Signed)
NURSING PROGRESS NOTE  ENRRIQUE MIERZWA [id number] Transfer Data: 04/02/2017 7:52 PM Attending Provider: Axel Filler, MD XEN:MMHWKG,SUPJSR OLIVER, MD Code Status: FULL   GERHARDT GLEED is a 66 y.o. male patient transferred from [facility]  -No acute distress noted.  -No complaints of shortness of breath.  -No complaints of chest pain.   Cardiac Monitoring: Box # 01 in place. Cardiac monitor yields:normal sinus rhythm.  Last Documented Vital Signs: Blood pressure (!) 105/50, pulse 99, temperature 97.8 F (36.6 C), temperature source Oral, resp. rate 20, height 5\' 11"  (1.803 m), weight 97.6 kg (215 lb 2.7 oz), SpO2 97 %.  IV Fluids:  IV in place, occlusive dsg intact without redness, IV cath forearm right, condition patent and no redness normal saline.   Allergies:  Penicillins  Past Medical History:   has a past medical history of Alcohol abuse; Anemia; AVM (arteriovenous malformation) of colon; Cirrhosis of liver ([place]); Colon polyps; COPD (chronic obstructive pulmonary disease) ([place]); Diastolic CHF ([place]); GERD (gastroesophageal reflux disease); GI bleed (01/2016); Hypertension; Hypokalemia; Internal hemorrhoids; Portal hypertensive gastropathy ([place]); Sigmoid diverticulosis; SVT (supraventricular tachycardia) ([place]); Symptomatic anemia; Thrombocytopenia ([place]); and Tobacco use.  Past Surgical History:   has a past surgical history that includes Cataract extraction; Hernia repair; Esophagogastroduodenoscopy (N/A, 07/15/2015); Colonoscopy (N/A, 07/15/2015); Esophagogastroduodenoscopy (N/A, 12/30/2015); Esophagogastroduodenoscopy (egd) with propofol (N/A, 01/24/2016); Hot hemostasis (N/A, 01/24/2016); and Esophagogastroduodenoscopy (N/A, 03/30/2017).  Social History:   reports that he quit smoking about 8 years ago. His smoking use included Cigarettes. He has a 80.00 pack-year smoking history. He has never used smokeless tobacco. He reports that he drinks alcohol. He reports that he does not use  drugs.  Skin: intact except where otherwise charted  Patient/Family orientated to room. Information packet given to patient/family. Admission inpatient armband information verified with patient/family to include name and date of birth and placed on patient arm. Side rails up x 2, fall assessment and education completed with patient/family. Patient/family able to verbalize understanding of risk associated with falls and verbalized understanding to call for assistance before getting out of bed. Call light within reach. Patient/family able to voice and demonstrate understanding of unit orientation instructions.

## 2017-04-03 DIAGNOSIS — I248 Other forms of acute ischemic heart disease: Secondary | ICD-10-CM

## 2017-04-03 DIAGNOSIS — K3189 Other diseases of stomach and duodenum: Secondary | ICD-10-CM

## 2017-04-03 DIAGNOSIS — K766 Portal hypertension: Secondary | ICD-10-CM

## 2017-04-03 DIAGNOSIS — D62 Acute posthemorrhagic anemia: Secondary | ICD-10-CM

## 2017-04-03 DIAGNOSIS — D696 Thrombocytopenia, unspecified: Secondary | ICD-10-CM

## 2017-04-03 DIAGNOSIS — I8510 Secondary esophageal varices without bleeding: Secondary | ICD-10-CM

## 2017-04-03 LAB — CBC
HEMATOCRIT: 26.1 % — AB (ref 39.0–52.0)
HEMOGLOBIN: 8.7 g/dL — AB (ref 13.0–17.0)
MCH: 30.6 pg (ref 26.0–34.0)
MCHC: 33.3 g/dL (ref 30.0–36.0)
MCV: 91.9 fL (ref 78.0–100.0)
Platelets: 83 10*3/uL — ABNORMAL LOW (ref 150–400)
RBC: 2.84 MIL/uL — AB (ref 4.22–5.81)
RDW: 17.1 % — ABNORMAL HIGH (ref 11.5–15.5)
WBC: 6.6 10*3/uL (ref 4.0–10.5)

## 2017-04-03 LAB — BASIC METABOLIC PANEL
ANION GAP: 7 (ref 5–15)
BUN: 9 mg/dL (ref 6–20)
CHLORIDE: 104 mmol/L (ref 101–111)
CO2: 24 mmol/L (ref 22–32)
CREATININE: 0.85 mg/dL (ref 0.61–1.24)
Calcium: 7.6 mg/dL — ABNORMAL LOW (ref 8.9–10.3)
GFR calc Af Amer: >60 mL/min (ref >60)
GFR calc non Af Amer: >60 mL/min (ref >60)
Glucose, Bld: 113 mg/dL — ABNORMAL HIGH (ref 65–99)
POTASSIUM: 3.7 mmol/L (ref 3.5–5.1)
SODIUM: 135 mmol/L (ref 135–145)

## 2017-04-03 MED ORDER — PEG-KCL-NACL-NASULF-NA ASC-C 100 G PO SOLR
0.5000 | Freq: Once | ORAL | Status: AC
  Administered 2017-04-03: 100 g via ORAL

## 2017-04-03 MED ORDER — PEG-KCL-NACL-NASULF-NA ASC-C 100 G PO SOLR: 1.0000 | Freq: Once | ORAL | Status: DC

## 2017-04-03 MED ORDER — PEG-KCL-NACL-NASULF-NA ASC-C 100 G PO SOLR
0.5000 | Freq: Once | ORAL | Status: AC
  Administered 2017-04-03: 100 g via ORAL
  Filled 2017-04-03: qty 1

## 2017-04-03 MED ORDER — BISACODYL 5 MG PO TBEC
10.0000 mg | DELAYED_RELEASE_TABLET | Freq: Once | ORAL | Status: DC
  Filled 2017-04-03: qty 2

## 2017-04-03 NOTE — Consult Note (Signed)
CARDIOLOGY CONSULT NOTE   Patient ID: John Warren MRN: [id number] DOB/AGE: [birth year] 66 y.o.  Admit date: 03/29/2017  Primary Physician   Leonard Downing, MD Primary Cardiologist   Dr. Wynonia Lawman Reason for Consultation   CP and pre-op clearance Requesting Physician  Dr. Evette Doffing  HPI:  John Warren is a 66 y.o. male who is being seen for Dr. Wynonia Lawman (he is out of town) today for the evaluation of CP and Pre-op clearnace at the request of Dr. Evette Doffing.   Hx of Alcoholic cirrhosis (quit drinking fall 2016), portal hypertension, COPD, chronic diastolic CHF, former smoker (46 years - quit in 2008), HLD, prior GI bleed due to AVM,  esophageal varices, portal gastropathy and chronic anemia admitted 03/30/17 for acute blood loss anemia. Received total 4 units of pRBCs on admission. EGD 4/18 shows no active bleeding in duodenum and stomach, but probable gastric varices and an AVM in the stomach. Patient had episode of chest pressure with radiation to L shoulder after required dye 4/28. Resolved with SL nitro. He had 3 separate episode each resolved with SL nitro. EKG with changes as below. No reoccurrence. Capsule endoscopy yesterday showed no specific source of melena and anemia. Cardiology is asked for pre-op clearance given CP earlier this admission. He has been walking in hallway multiple times/day without any angina or dyspnea.   Patient of Dr. Wynonia Lawman. Remote stress test normal (~20 years ago). Last seen by Dr. Wynonia Lawman last month of LE edema (chronic)--> improved with diuretics. [place] out any significant issue. Does noted tiredness and dyspnea with walking up hill. No orthopnea, PND, syncope, palpitations of dizziness.   EKG as below - all  personally reviewed 4/27 - NSR at rate of 101 bpm 4/28 (16:51)  - NSR @ rate of 110 bpm and non specific ST abnormality in anterior lateral leads 4/29 (2:17) - Sinus tachycardia @ 112 bpm, PVC,  , ST depression in anterior leads with  TWI in lateral leads, TWI in lead III and aVL 4/29 (5:10) - NSR at rate of 109 bpm with resolution of ST/ T changes 4/29 (7:37) - NSR at rate of 92 bpm  4/29 (13:49) - sinus tachycardia at rate of 113 bpm, PVC  Troponin flat trend 0.06 ( 4/28 @  2020) -->0.06 (4/29 @ 0251) -->0.06 (4/29 @ 3474)  Past Medical History:  Diagnosis Date  . Alcohol abuse   . Anemia   . AVM (arteriovenous malformation) of colon    a. By colonoscopy 07/2015.  Marland Kitchen Cirrhosis of liver ([place])   . Colon polyps   . COPD (chronic obstructive pulmonary disease) ([place])   . Diastolic CHF ([place])   . GERD (gastroesophageal reflux disease)   . GI bleed 01/2016  . Hypertension   . Hypokalemia   . Internal hemorrhoids   . Portal hypertensive gastropathy ([place])    a. By EGD 07/2015.  . Sigmoid diverticulosis   . SVT (supraventricular tachycardia) ([place])    a. 07/2015 in setting of severe hypokalemia.  . Symptomatic anemia    a. 07/2015 - felt multifactorial from cirrhosis and AVMs.  . Thrombocytopenia ([place])   . Tobacco use      Past Surgical History:  Procedure Laterality Date  . CATARACT EXTRACTION    . COLONOSCOPY N/A 07/15/2015   Procedure: COLONOSCOPY;  Surgeon: Wilford Corner, MD;  Location: [facility] ENDOSCOPY;  Service: Endoscopy;  Laterality: N/A;  . ESOPHAGOGASTRODUODENOSCOPY N/A 07/15/2015   Procedure: ESOPHAGOGASTRODUODENOSCOPY (EGD);  Surgeon: Evette Doffing  Michail Sermon, MD;  Location: [facility];  Service: Endoscopy;  Laterality: N/A;  . ESOPHAGOGASTRODUODENOSCOPY N/A 12/30/2015   Procedure: ESOPHAGOGASTRODUODENOSCOPY (EGD);  Surgeon: Ronald Lobo, MD;  Location: [facility] ENDOSCOPY;  Service: Endoscopy;  Laterality: N/A;  . ESOPHAGOGASTRODUODENOSCOPY N/A 03/30/2017   Procedure: ESOPHAGOGASTRODUODENOSCOPY (EGD);  Surgeon: Laurence Spates, MD;  Location[facility] ENDOSCOPY;  Service: Endoscopy;  Laterality: N/A;  . ESOPHAGOGASTRODUODENOSCOPY (EGD) WITH PROPOFOL N/A 01/24/2016   Procedure: ESOPHAGOGASTRODUODENOSCOPY (EGD) WITH PROPOFOL;   Surgeon: Wonda Horner, MD;  Location: [facility] ENDOSCOPY;  Service: Endoscopy;  Laterality: N/A;  . HERNIA REPAIR    . HOT HEMOSTASIS N/A 01/24/2016   Procedure: HOT HEMOSTASIS (ARGON PLASMA COAGULATION/BICAP);  Surgeon: Wonda Horner, MD;  Location[facility] ENDOSCOPY;  Service: Endoscopy;  Laterality: N/A;    Allergies  Allergen Reactions  . Penicillins Rash    Has patient had a PCN reaction causing immediate rash, facial/tongue/throat swelling, SOB or lightheadedness with hypotension: Yes Has patient had a PCN reaction causing severe rash involving mucus membranes or skin necrosis: No Has patient had a PCN reaction that required hospitalization: No Has patient had a PCN reaction occurring within the last 10 years: No If all of the above answers are "NO", then may proceed with Cephalosporin use. Had CTX before    I have reviewed the patient's current medications . bisacodyl  10 mg Oral Once  . furosemide  80 mg Oral Daily  . lactulose  10 g Oral BID  . pantoprazole (PROTONIX) IV  40 mg Intravenous Q12H  . peg 3350 powder  0.5 kit Oral Once   And  . [START ON 04/04/2017] peg 3350 powder  0.5 kit Oral Once  . sodium chloride flush  3 mL Intravenous Q12H  . spironolactone  25 mg Oral BID   . sodium chloride     acetaminophen **OR** acetaminophen, nitroGLYCERIN, ondansetron **OR** ondansetron (ZOFRAN) IV  Prior to Admission medications   Medication Sig Start Date End Date Taking? Authorizing Provider  acetaminophen (TYLENOL) 500 MG tablet Take 500-1,000 mg by mouth every 8 (eight) hours as needed (for pain).   Yes Historical Provider, MD  ferrous sulfate 325 (65 FE) MG tablet Take 1 tablet (325 mg total) by mouth 3 (three) times daily with meals. Patient taking differently: Take 325 mg by mouth every evening.  07/17/15  Yes Dayna N Dunn, PA-C  folic acid (FOLVITE) 1 MG tablet Take 1 tablet (1 mg total) by mouth daily. 01/02/16  Yes Thurnell Lose, MD  furosemide (LASIX) 80 MG tablet Take 80 mg  by mouth daily.   Yes Historical Provider, MD  nitroGLYCERIN (NITROSTAT) 0.4 MG SL tablet Place 0.4 mg under the tongue every 5 (five) minutes as needed for chest pain.   Yes Historical Provider, MD  potassium chloride SA (K-DUR,KLOR-CON) 20 MEQ tablet Take 20 mEq by mouth every morning.    Yes Historical Provider, MD  spironolactone (ALDACTONE) 50 MG tablet Take 1 tablet (50 mg total) by mouth daily. Patient taking differently: Take 50 mg by mouth 2 (two) times daily.  01/02/16  Yes Thurnell Lose, MD  thiamine 100 MG tablet Take 1 tablet (100 mg total) by mouth daily. 01/02/16  Yes Thurnell Lose, MD  traMADol (ULTRAM) 50 MG tablet Take 50 mg by mouth every 6 (six) hours as needed (for pain).   Yes Historical Provider, MD  lactulose (CHRONULAC) 10 GM/15ML solution Take 30 mLs (20 g total) by mouth daily. Patient not taking: Reported on 03/29/2017 01/26/16  Florencia Reasons, MD  omeprazole (PRILOSEC) 40 MG capsule Take 1 capsule (40 mg total) by mouth every evening. Patient not taking: Reported on 03/29/2017 01/02/16   Thurnell Lose, MD  ranitidine (ZANTAC) 150 MG tablet Take 150 mg by mouth 2 (two) times daily.    Historical Provider, MD  rifaximin (XIFAXAN) 550 MG TABS tablet Take 1 tablet (550 mg total) by mouth 2 (two) times daily. Patient not taking: Reported on 03/29/2017 01/26/16   Florencia Reasons, MD     Social History   Social History  . Marital status: Single    Spouse name: N/A  . Number of children: N/A  . Years of education: N/A   Occupational History  . Not on file.   Social History Main Topics  . Smoking status: Former Smoker    Packs/day: 2.00    Years: 40.00    Types: Cigarettes    Quit date: 08/14/2008  . Smokeless tobacco: Never Used  . Alcohol use 0.0 oz/week     Comment: 1 pint whiskey daily several beer weekly    01/2016  " no alcohol in 3 weeks "  . Drug use: No  . Sexual activity: Not on file   Other Topics Concern  . Not on file   Social History Narrative  . No  narrative on file    Family Status  Relation Status  . Father Deceased   pulmonary embolus  . Mother Deceased   liver cancer  . Sister Alive   Family History  Problem Relation Age of Onset  . Liver cancer Mother   . Diabetes Mellitus II Sister      ROS:  Full 14 point review of systems complete and found to be negative unless listed above.  Physical Exam: Blood pressure (!) 98/45, pulse 94, temperature 98.2 F (36.8 C), temperature source Oral, resp. rate 17, height '5\' 11"'  (1.803 m), weight 215 lb 2.7 oz (97.6 kg), SpO2 99 %.  General: Well developed, well nourished, male in no acute distress Head: Eyes PERRLA, No xanthomas. Normocephalic and atraumatic, oropharynx without edema or exudate.  Lungs: Resp regular and unlabored, CTA. Heart: RRR no s3, s4, soft systolic  murmurs..   Neck: No carotid bruits. No lymphadenopathy.  + JVD. Abdomen: Bowel sounds present, distended  Msk:  No spine or cva tenderness. No weakness, no joint deformities or effusions. Extremities: No clubbing, cyanosis. DP/PT/Radials 2+ and equal bilaterally. 1-2 + edema bilaterally  Neuro: Alert and oriented X 3. No focal deficits noted. Psych:  Good affect, responds appropriately Skin: No rashes or lesions noted.  Labs:   Lab Results  Component Value Date   WBC 6.6 04/03/2017   HGB 8.7 (L) 04/03/2017   HCT 26.1 (L) 04/03/2017   MCV 91.9 04/03/2017   PLT 83 (L) 04/03/2017   No results for input(s): INR in the last 72 hours.  Recent Labs Lab 04/01/17 0301  04/03/17 0525  NA 135  < > 135  K 3.8  < > 3.7  CL 111  < > 104  CO2 20*  < > 24  BUN 13  < > 9  CREATININE 0.78  < > 0.85  CALCIUM 7.4*  < > 7.6*  PROT 4.2*  --   --   BILITOT 0.7  --   --   ALKPHOS 52  --   --   ALT 18  --   --   AST 26  --   --   GLUCOSE 130*  < >  113*  ALBUMIN 2.3*  --   --   < > = values in this interval not displayed. No results found for: DDIMER Lipase  Date/Time Value Ref Range Status  03/29/2017 06:55 PM  31 11 - 51 U/L Final   Echo: 12/2015 Study Conclusions  - Left ventricle: The cavity size was normal. Wall thickness was   normal. Systolic function was vigorous. The estimated ejection   fraction was in the range of 65% to 70%. Features are consistent   with a pseudonormal left ventricular filling pattern, with   concomitant abnormal relaxation and increased filling pressure   (grade 2 diastolic dysfunction). - Aortic valve: AV is thickened, calcified with mildly restricted   motion Peak and mean gradients through the valve are 19 and 11 mm   Hg respectively consistent with mild AS. - Left atrium: The atrium was mildly dilated.  Radiology:  Dg Chest 2 View  Result Date: 04/02/2017 CLINICAL DATA:  Internal bleeding of unknown etiology. Onset of left-sided chest pain with hypoxia and cough over the past 3 days. EXAM: CHEST  2 VIEW COMPARISON:  Portable chest x-ray of April 01, 2017 FINDINGS: The lungs are mildly hyperinflated with hemidiaphragm flattening. The interstitial markings remain increased but have improved since yesterday's study. There is no focal infiltrate. There is blunting of the posterior costophrenic angles bilaterally. The heart and pulmonary vascularity are normal. The mediastinum is normal in width. There is calcification in the wall of the aortic arch. The bony thorax exhibits no acute abnormality. IMPRESSION: COPD. No CHF. Minimal basilar atelectasis and tiny bilateral pleural effusions layering posteriorly. Electronically Signed   By: David  Martinique M.D.   On: 04/02/2017 08:00   ASSESSMENT AND PLAN:     1. Chest pain - Started after receiving contrast dye. Resolved after SL nitro. EKG with ST/T wave changes in anterior lateral leads. Mild flat troponin trend. No reoccurance since 4/29. Walking in hallway without angina of dyspnea. Does have exertional dyspnea and fatigue at home, especially walking up hill.   2. Chronic diastolic CHF with portal hypertensin, cirrhosis and  chronic edema - He has 1-2 + BL edema. Per patient it has been improved with increased dose of diuretics in past one month.  - Continue lasix 75m po BID and spironolactone.  I & O not measured.  - Last echo 12/2015 showed normal LV function, grade 1 DD and mild AS.    Dr. CSallyanne Kuster to see later today.    Signed:Leanor Kail PCarmichael5/01/2017, 2:24 PM Pager [phone]  I have seen and examined the patient along with Bhagat,Bhavinkumar, PA.  I have reviewed the chart, notes and new data.  I agree with PA's note.  Key new complaints: symptoms were highly compatible with angina and accompanied by transient ECG changes (anterolateral ST depression). He has never had them before or since. He was very anemic at the time and may have had transient BP fluctuation related to the contrast administration Key examination changes: edema 2+ symmetrical, AS murmur early peaking. No jaundice, but he is pale Key new findings / data: note scattered atherosclerosis in abdominal arterial circulation, changes of severe portal HTN.  PLAN: I believe it is highly likely that Mr. Dorning has CAD, but the episode of angina and the ECG changes were likely due to demand ischemia (anemia, varying hemodynamics), not a true acute coronary syndrome. Unfortunately, most of our evaluation would be fruitless since he is not a candidate for most therapies for CAD(aspirin and other  antiplatelets are clearly out of the question, cirrhosis is a contraindication for statins). It might be a good idea to start a beta blocker, if his BP increases and allows this. Specifically, nadolol may help both to reduce portal HTN and risk of bleeding varices and also serve as an antianginal.  His current BP precludes starting a beta blocker. He tolerated colonoscopy a year ago without complications. While his cardiac risk is above average, it is not prohibitive and there are no additional interventions or evaluation that could mitigate  risk.  Sanda Klein, MD, [place] [phone] 04/03/2017, 5:54 PM

## 2017-04-03 NOTE — Progress Notes (Signed)
  Capsule endoscopy report ------------------------------------- -  Rapid motility of Capsule through stomach Reaching Duodenum within 6 Minutes. - Small nonbleeding AVM in mid small bowel and one nonspecific erosion in the small bowel. - Capsule reached cecum at 4 hours and 33 minute. - Poor visualization of colonic mucosa without any evidence of blood in the small bowel or in the colon.    Otis Brace MD, [place] 04/03/2017, 11:32 AM  Pager [phone]  If no answer or after 5 PM call [phone]

## 2017-04-03 NOTE — Progress Notes (Signed)
   Subjective:   Patient states he feels well today. He had one BM yesterday morning with small amount of old clot. He was able to walk around the unit multiple times yesterday and plans on doing so again today. Patient denies further episodes of chest pain since early Sunday morning.  I called patient in early afternoon - he states he has had 2 normal BMs this AM with small amount of blood on toilet paper only.  Objective:  Vital signs in last 24 hours: Vitals:   04/02/17 2204 04/02/17 2204 04/03/17 0611 04/03/17 0629  BP:  (!) 118/49  (!) 98/45  Pulse:  84  94  Resp:  17  17  Temp:  98.7 F (37.1 C)  98.2 F (36.8 C)  TempSrc:  Oral  Oral  SpO2:  96%  99%  Weight: 97.6 kg (215 lb 2.7 oz)  97.6 kg (215 lb 2.7 oz)   Height: 5\' 11"  (1.803 m)      Constitutional: NAD CV: distant heart sounds, RRR, no murmurs, rubs or gallops appreciated; with 2+ pitting LE L>R; distal pulses intact Resp: no increased work of breathing; diminished breath sounds throughout, no rales, rhonchi or wheezes appreciated Abd: distended, soft, +BS Skin: multiple SKs; R medial ankle with indurated and hyperpigmented skin  Neuro: Alert and oriented x3  Assessment/Plan:  Principal Problem:   GI bleeding Active Problems:   Cirrhosis, alcoholic (HCC)   AVM (arteriovenous malformation) of colon   Sigmoid diverticulosis   Portal hypertensive gastropathy (HCC)   Thrombocytopenia (HCC)  Acute GI Bleed:  No further grossly blood BM's. Hgb has stabilized and was 8.7 this morning. Capsule endoscopy has been completed - found small nonbleeding AVM in mid SB along with one nonspecific rosion in SB; there was no evidence of blood in small bowel or colon. Colonoscopy scheduled tomorrow after cardiology clearance. Per re-eval of EGD imaging by GI, gastric varix might have evidence of prior bleeding - if colonoscopy negative they would consider IR consult for inpatient BRTO. --GI following - appreciate their  assistance --tentatively scheduled colonoscopy tomorrow pending cardiology clearance --d/c Octreotide gtt  --Ceftriaxone day 5/5 --IV Protonix 40mg  BID --f/u AM CBC --transfuse as needed to maintain hgb > 8.0  --Liquid diet today then NPO if colonoscopy confirmed for morning  Hypoxia: Likely from atelectasis vs anemia  --goal O2 sat >88% --follow BCx - NGx4d --follow Hgbs  Chest pain: Patient had chest pain this admission with new and reversible EKG changes; Trops trended flat - likely from demand ischemia in setting of acute anemia. No further chest pain since 4/29.  H/o dCHF: Patient on furosemide and spironolactone at home, which were held in setting of hypotension, tachycardia, and active bleeding. He progressively became volume overloaded. Continues to be clinically volume overloaded weights are improving.  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 BRTO to be considered inpatient if colonoscopy negative.  --furosemide 80mg  daily; spironolactone 25mg  BID --lactulose BID  Diet: NPO for capsule endoscopy VTE ppx: SCD's Code: FULL  Dispo: Anticipated discharge in approximately 2-3 day(s).   Alphonzo Grieve, MD 04/03/2017, 10:06 AM IMT[facility] - PGY1 Pager [phone]

## 2017-04-03 NOTE — Progress Notes (Signed)
Subjective: No blood in stool. Fatigue and weakness slowly improving.  Objective: Vital signs in last 24 hours: Temp:  [97.8 F (36.6 C)-98.7 F (37.1 C)] 98.2 F (36.8 C) (05/02 0629) Pulse Rate:  [84-99] 94 (05/02 0629) Resp:  [17-20] 17 (05/02 0629) BP: (98-118)/(45-50) 98/45 (05/02 0629) SpO2:  [96 %-99 %] 99 % (05/02 0629) Weight:  [97.6 kg (215 lb 2.7 oz)] 97.6 kg (215 lb 2.7 oz) (05/02 0611) Weight change: 0 kg (0 lb) Last BM Date: 04/02/17  PE: GEN:  NAD SKIN:  Edematous, scattered ecchymoses  Lab Results: CBC    Component Value Date/Time   WBC 6.6 04/03/2017 0525   RBC 2.84 (L) 04/03/2017 0525   HGB 8.7 (L) 04/03/2017 0525   HCT 26.1 (L) 04/03/2017 0525   PLT 83 (L) 04/03/2017 0525   MCV 91.9 04/03/2017 0525   MCH 30.6 04/03/2017 0525   MCHC 33.3 04/03/2017 0525   RDW 17.1 (H) 04/03/2017 0525   LYMPHSABS 2.3 03/29/2017 2208   MONOABS 1.8 (H) 03/29/2017 2208   EOSABS 0.1 03/29/2017 2208   BASOSABS 0.0 03/29/2017 2208   CMP     Component Value Date/Time   NA 135 04/03/2017 0525   K 3.7 04/03/2017 0525   CL 104 04/03/2017 0525   CO2 24 04/03/2017 0525   GLUCOSE 113 (H) 04/03/2017 0525   BUN 9 04/03/2017 0525   CREATININE 0.85 04/03/2017 0525   CALCIUM 7.6 (L) 04/03/2017 0525   PROT 4.2 (L) 04/01/2017 0301   ALBUMIN 2.3 (L) 04/01/2017 0301   AST 26 04/01/2017 0301   ALT 18 04/01/2017 0301   ALKPHOS 52 04/01/2017 0301   BILITOT 0.7 04/01/2017 0301   GFRNONAA >60 04/03/2017 0525   GFRAA >60 04/03/2017 0525    Assessment:  1. 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   2. Acute on chronic anemia. 3. Hematochezia. Negative endoscopy. Capsule endoscopy no specific source of melena or anemia seen. 4. Shortness of breath with clinical evidence of volume overload.  Plan:  1.  Cardiac clearance for planned colonoscopy tomorrow; patient has had intermittent chest pains this admission. 2.  Colonoscopy tomorrow 0830 with Dr. Oletta Lamas if cardiac  clearance granted. 3.  I have looked at EGD pictures, and I think the gastric varix might have had some evidence of prior bleeding, thus if colonoscopy unrevealing, would reconsult IR for further discussion of BRTO this admission. 4.  [facility] will follow.   GUERINO, CAPORALE 04/03/2017, 12:32 PM   Pager [phone] If no answer or after 5 PM call [phone]

## 2017-04-03 NOTE — Progress Notes (Signed)
Internal Medicine Attending:   I saw and examined the patient. I reviewed the resident's note and I agree with the resident's findings and plan as documented in the resident's note.  66 year old man with cirrhosis and portal hypertension admitted for evaluation of GI bleeding. This is his third hospitalization for GI bleeding without a clear source so far. 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 EGD and capsule were completed which have not shown an obvious source of bleeding. Planning for colonoscopy tomorrow to complete the workup. Fortunately the patient has spontaneously stopped bleeding, has had only one bowel movement daily over the last 3 days. History with octreotide for 4 days, discontinue today. Also treated with ceftriaxone, discontinue tomorrow which will be 5 days.

## 2017-04-04 ENCOUNTER — Inpatient Hospital Stay (HOSPITAL_COMMUNITY): Payer: Medicare Other | Admitting: Certified Registered Nurse Anesthetist

## 2017-04-04 ENCOUNTER — Encounter (HOSPITAL_COMMUNITY)
Admission: EM | Disposition: A | Discharge status: Home / Self Care | Payer: Self-pay | Source: Home / Self Care | Attending: Pulmonary Disease

## 2017-04-04 ENCOUNTER — Inpatient Hospital Stay (HOSPITAL_COMMUNITY): Payer: Medicare Other

## 2017-04-04 ENCOUNTER — Other Ambulatory Visit: Payer: Self-pay | Admitting: Student

## 2017-04-04 ENCOUNTER — Inpatient Hospital Stay (HOSPITAL_COMMUNITY): Payer: Medicare Other | Admitting: Anesthesiology

## 2017-04-04 ENCOUNTER — Encounter (HOSPITAL_COMMUNITY): Payer: Self-pay | Admitting: *Deleted

## 2017-04-04 DIAGNOSIS — K922 Gastrointestinal hemorrhage, unspecified: Secondary | ICD-10-CM

## 2017-04-04 DIAGNOSIS — I9581 Postprocedural hypotension: Secondary | ICD-10-CM

## 2017-04-04 DIAGNOSIS — T8119XA Other postprocedural shock, initial encounter: Secondary | ICD-10-CM

## 2017-04-04 DIAGNOSIS — J9600 Acute respiratory failure, unspecified whether with hypoxia or hypercapnia: Secondary | ICD-10-CM

## 2017-04-04 DIAGNOSIS — I8500 Esophageal varices without bleeding: Secondary | ICD-10-CM | POA: Diagnosis present

## 2017-04-04 DIAGNOSIS — R0902 Hypoxemia: Secondary | ICD-10-CM | POA: Diagnosis not present

## 2017-04-04 HISTORY — PX: IR TIPS: IMG2295

## 2017-04-04 HISTORY — PX: IR ANGIOGRAM SELECTIVE EACH ADDITIONAL VESSEL: IMG667

## 2017-04-04 HISTORY — PX: IR US GUIDE VASC ACCESS RIGHT: IMG2390

## 2017-04-04 HISTORY — PX: ESOPHAGOGASTRODUODENOSCOPY: SHX5428

## 2017-04-04 HISTORY — PX: COLONOSCOPY WITH PROPOFOL: SHX5780

## 2017-04-04 HISTORY — PX: IR EMBO ART  VEN HEMORR LYMPH EXTRAV  INC GUIDE ROADMAPPING: IMG5450

## 2017-04-04 HISTORY — PX: RADIOLOGY WITH ANESTHESIA: SHX6223

## 2017-04-04 HISTORY — PX: IR VENOGRAM RENAL UNI RIGHT: IMG681

## 2017-04-04 LAB — HEPATIC FUNCTION PANEL
ALBUMIN: 3.5 g/dL (ref 3.5–5.0)
ALK PHOS: 63 U/L (ref 38–126)
ALT: 25 U/L (ref 17–63)
AST: 66 U/L — ABNORMAL HIGH (ref 15–41)
BILIRUBIN INDIRECT: 2.1 mg/dL — AB (ref 0.3–0.9)
Bilirubin, Direct: 1.2 mg/dL — ABNORMAL HIGH (ref 0.1–0.5)
TOTAL PROTEIN: 5.6 g/dL — AB (ref 6.5–8.1)
Total Bilirubin: 3.3 mg/dL — ABNORMAL HIGH (ref 0.3–1.2)

## 2017-04-04 LAB — CULTURE, BLOOD (ROUTINE X 2)
CULTURE: NO GROWTH
Culture: NO GROWTH
SPECIAL REQUESTS: ADEQUATE

## 2017-04-04 LAB — CBC
HCT: 24.7 % — ABNORMAL LOW (ref 39.0–52.0)
HEMOGLOBIN: 8.3 g/dL — AB (ref 13.0–17.0)
MCH: 31.1 pg (ref 26.0–34.0)
MCHC: 33.6 g/dL (ref 30.0–36.0)
MCV: 92.5 fL (ref 78.0–100.0)
Platelets: 118 10*3/uL — ABNORMAL LOW (ref 150–400)
RBC: 2.67 MIL/uL — AB (ref 4.22–5.81)
RDW: 16.9 % — ABNORMAL HIGH (ref 11.5–15.5)
WBC: 12.2 10*3/uL — AB (ref 4.0–10.5)

## 2017-04-04 LAB — HEMOGLOBIN AND HEMATOCRIT, BLOOD
HCT: 35.3 % — ABNORMAL LOW (ref 39.0–52.0)
HEMATOCRIT: 35.2 % — AB (ref 39.0–52.0)
HEMOGLOBIN: 11.5 g/dL — AB (ref 13.0–17.0)
HEMOGLOBIN: 11.7 g/dL — AB (ref 13.0–17.0)

## 2017-04-04 LAB — POCT I-STAT 3, ART BLOOD GAS (G3+)
ACID-BASE DEFICIT: 2 mmol/L (ref 0.0–2.0)
Acid-base deficit: 11 mmol/L — ABNORMAL HIGH (ref 0.0–2.0)
Acid-base deficit: 7 mmol/L — ABNORMAL HIGH (ref 0.0–2.0)
BICARBONATE: 25.3 mmol/L (ref 20.0–28.0)
Bicarbonate: 17.6 mmol/L — ABNORMAL LOW (ref 20.0–28.0)
Bicarbonate: 20.2 mmol/L (ref 20.0–28.0)
O2 SAT: 67 %
O2 Saturation: 96 %
O2 Saturation: 99 %
PCO2 ART: 49.8 mmHg — AB (ref 32.0–48.0)
PCO2 ART: 47 mmHg (ref 32.0–48.0)
PH ART: 7.242 — AB (ref 7.350–7.450)
PH ART: 7.31 — AB (ref 7.350–7.450)
PO2 ART: 43 mmHg — AB (ref 83.0–108.0)
Patient temperature: 97
TCO2: 19 mmol/L (ref 0–100)
TCO2: 22 mmol/L (ref 0–100)
TCO2: 27 mmol/L (ref 0–100)
pCO2 arterial: 49.9 mmHg — ABNORMAL HIGH (ref 32.0–48.0)
pH, Arterial: 7.15 — CL (ref 7.350–7.450)
pO2, Arterial: 182 mmHg — ABNORMAL HIGH (ref 83.0–108.0)
pO2, Arterial: 85 mmHg (ref 83.0–108.0)

## 2017-04-04 LAB — BASIC METABOLIC PANEL
ANION GAP: 10 (ref 5–15)
Anion gap: 10 (ref 5–15)
BUN: 9 mg/dL (ref 6–20)
BUN: 9 mg/dL (ref 6–20)
CHLORIDE: 103 mmol/L (ref 101–111)
CHLORIDE: 102 mmol/L (ref 101–111)
CO2: 22 mmol/L (ref 22–32)
CO2: 25 mmol/L (ref 22–32)
CREATININE: 0.89 mg/dL (ref 0.61–1.24)
Calcium: 7.9 mg/dL — ABNORMAL LOW (ref 8.9–10.3)
Calcium: 7.2 mg/dL — ABNORMAL LOW (ref 8.9–10.3)
Creatinine, Ser: 0.94 mg/dL (ref 0.61–1.24)
GFR calc Af Amer: >60 mL/min (ref >60)
GFR calc non Af Amer: >60 mL/min (ref >60)
GFR calc non Af Amer: >60 mL/min (ref >60)
Glucose, Bld: 136 mg/dL — ABNORMAL HIGH (ref 65–99)
Glucose, Bld: 192 mg/dL — ABNORMAL HIGH (ref 65–99)
POTASSIUM: 4.8 mmol/L (ref 3.5–5.1)
Potassium: 3.8 mmol/L (ref 3.5–5.1)
SODIUM: 135 mmol/L (ref 135–145)
Sodium: 137 mmol/L (ref 135–145)

## 2017-04-04 LAB — POCT I-STAT, CHEM 8
BUN: 9 mg/dL (ref 6–20)
CREATININE: 0.8 mg/dL (ref 0.61–1.24)
Calcium, Ion: 1.08 mmol/L — ABNORMAL LOW (ref 1.15–1.40)
Chloride: 103 mmol/L (ref 101–111)
Glucose, Bld: 128 mg/dL — ABNORMAL HIGH (ref 65–99)
HCT: 21 % — ABNORMAL LOW (ref 39.0–52.0)
HEMOGLOBIN: 7.1 g/dL — AB (ref 13.0–17.0)
POTASSIUM: 4.2 mmol/L (ref 3.5–5.1)
Sodium: 135 mmol/L (ref 135–145)
TCO2: 22 mmol/L (ref 0–100)

## 2017-04-04 LAB — PROTIME-INR
INR: 1.49
PROTHROMBIN TIME: 18.1 s — AB (ref 11.4–15.2)

## 2017-04-04 LAB — GLUCOSE, CAPILLARY
GLUCOSE-CAPILLARY: 164 mg/dL — AB (ref 65–99)
GLUCOSE-CAPILLARY: 170 mg/dL — AB (ref 65–99)

## 2017-04-04 LAB — TROPONIN I: TROPONIN I: 3.85 ng/mL — AB (ref <0.03)

## 2017-04-04 LAB — PREPARE RBC (CROSSMATCH)

## 2017-04-04 LAB — BRAIN NATRIURETIC PEPTIDE: B Natriuretic Peptide: 96.1 pg/mL (ref 0.0–100.0)

## 2017-04-04 LAB — MAGNESIUM: MAGNESIUM: 1.8 mg/dL (ref 1.7–2.4)

## 2017-04-04 LAB — PLATELET COUNT: PLATELETS: 181 10*3/uL (ref 150–400)

## 2017-04-04 SURGERY — COLONOSCOPY WITH PROPOFOL
Anesthesia: Monitor Anesthesia Care

## 2017-04-04 SURGERY — COLONOSCOPY WITH PROPOFOL
Anesthesia: Monitor Anesthesia Care | Laterality: Left

## 2017-04-04 SURGERY — EGD (ESOPHAGOGASTRODUODENOSCOPY)
Anesthesia: Moderate Sedation

## 2017-04-04 SURGERY — RADIOLOGY WITH ANESTHESIA
Anesthesia: General

## 2017-04-04 MED ORDER — IOPAMIDOL (ISOVUE-300) INJECTION 61%
INTRAVENOUS | Status: AC
  Filled 2017-04-04: qty 150

## 2017-04-04 MED ORDER — IOPAMIDOL (ISOVUE-300) INJECTION 61%
INTRAVENOUS | Status: AC
  Administered 2017-04-04: 100 mL
  Filled 2017-04-04: qty 150

## 2017-04-04 MED ORDER — MIDAZOLAM HCL 2 MG/2ML IJ SOLN
INTRAMUSCULAR | Status: AC
  Filled 2017-04-04: qty 2

## 2017-04-04 MED ORDER — VASOPRESSIN 20 UNIT/ML IV SOLN
0.0300 [IU]/min | INTRAVENOUS | Status: DC
  Filled 2017-04-04: qty 2

## 2017-04-04 MED ORDER — HEPARIN SODIUM (PORCINE) 1000 UNIT/ML IJ SOLN
INTRAMUSCULAR | Status: AC
  Administered 2017-04-04: 14:00:00
  Filled 2017-04-04: qty 1

## 2017-04-04 MED ORDER — ALBUMIN HUMAN 5 % IV SOLN
INTRAVENOUS | Status: DC | PRN
  Administered 2017-04-04 (×3): via INTRAVENOUS

## 2017-04-04 MED ORDER — PROPOFOL 500 MG/50ML IV EMUL
INTRAVENOUS | Status: DC | PRN
  Administered 2017-04-04: 20 ug/kg/min via INTRAVENOUS

## 2017-04-04 MED ORDER — LACTATED RINGERS IV SOLN
INTRAVENOUS | Status: DC | PRN
  Administered 2017-04-04 (×3): via INTRAVENOUS

## 2017-04-04 MED ORDER — LACTATED RINGERS IV SOLN
INTRAVENOUS | Status: DC | PRN
  Administered 2017-04-04: 1000 mL
  Administered 2017-04-04: 08:00:00 via INTRAVENOUS

## 2017-04-04 MED ORDER — FAMOTIDINE IN NACL 20-0.9 MG/50ML-% IV SOLN
20.0000 mg | Freq: Two times a day (BID) | INTRAVENOUS | Status: DC
  Filled 2017-04-04: qty 50

## 2017-04-04 MED ORDER — PHENYLEPHRINE HCL 10 MG/ML IJ SOLN
INTRAMUSCULAR | Status: DC | PRN
  Administered 2017-04-04 (×2): 120 ug via INTRAVENOUS
  Administered 2017-04-04 (×2): 80 ug via INTRAVENOUS

## 2017-04-04 MED ORDER — SODIUM CHLORIDE 0.9 % IV SOLN
0.0000 ug/min | INTRAVENOUS | Status: DC
  Administered 2017-04-04: 100 ug/min via INTRAVENOUS
  Administered 2017-04-04: 150 ug/min via INTRAVENOUS
  Administered 2017-04-04: 50 ug/min via INTRAVENOUS
  Filled 2017-04-04 (×3): qty 4

## 2017-04-04 MED ORDER — FUROSEMIDE 10 MG/ML IJ SOLN
60.0000 mg | Freq: Once | INTRAMUSCULAR | Status: AC
  Administered 2017-04-04: 60 mg via INTRAVENOUS
  Filled 2017-04-04: qty 6

## 2017-04-04 MED ORDER — RIFAXIMIN 550 MG PO TABS
550.0000 mg | ORAL_TABLET | Freq: Two times a day (BID) | ORAL | Status: DC
  Administered 2017-04-08: 550 mg
  Filled 2017-04-04 (×11): qty 1

## 2017-04-04 MED ORDER — FENTANYL CITRATE (PF) 100 MCG/2ML IJ SOLN
INTRAMUSCULAR | Status: AC
  Filled 2017-04-04: qty 2

## 2017-04-04 MED ORDER — OCTREOTIDE LOAD VIA INFUSION
50.0000 ug | Freq: Once | INTRAVENOUS | Status: AC
  Administered 2017-04-04: 50 ug via INTRAVENOUS
  Filled 2017-04-04: qty 25

## 2017-04-04 MED ORDER — SODIUM CHLORIDE 0.9 % IV SOLN: Freq: Once | INTRAVENOUS | Status: AC

## 2017-04-04 MED ORDER — THIAMINE HCL 100 MG/ML IJ SOLN
100.0000 mg | Freq: Every day | INTRAMUSCULAR | Status: DC
  Administered 2017-04-05 – 2017-04-08 (×4): 100 mg via INTRAVENOUS
  Filled 2017-04-04 (×5): qty 1

## 2017-04-04 MED ORDER — DOPAMINE-DEXTROSE 3.2-5 MG/ML-% IV SOLN
0.0000 ug/kg/min | INTRAVENOUS | Status: DC
Start: 2017-04-04 — End: 2017-04-06

## 2017-04-04 MED ORDER — ORAL CARE MOUTH RINSE
15.0000 mL | Freq: Four times a day (QID) | OROMUCOSAL | Status: DC
Start: 2017-04-05 — End: 2017-04-08
  Administered 2017-04-04 – 2017-04-08 (×15): 15 mL via OROMUCOSAL

## 2017-04-04 MED ORDER — CALCIUM GLUCONATE 10 % IV SOLN
1.0000 g | Freq: Once | INTRAVENOUS | Status: AC
  Administered 2017-04-04: 1 g via INTRAVENOUS
  Filled 2017-04-04: qty 10

## 2017-04-04 MED ORDER — LIPIODOL ULTRAFLUID INJECTION
INTRAMUSCULAR | Status: AC | PRN
  Administered 2017-04-04: 10 mL

## 2017-04-04 MED ORDER — SPOT INK MARKER SYRINGE KIT
PACK | SUBMUCOSAL | Status: AC
  Filled 2017-04-04: qty 5

## 2017-04-04 MED ORDER — ALBUMIN HUMAN 5 % IV SOLN
INTRAVENOUS | Status: DC | PRN
  Administered 2017-04-04 (×2): via INTRAVENOUS

## 2017-04-04 MED ORDER — ROCURONIUM BROMIDE 50 MG/5ML IV SOLN: 50.0000 mg | Freq: Once | INTRAVENOUS | Status: DC

## 2017-04-04 MED ORDER — IPRATROPIUM-ALBUTEROL 0.5-2.5 (3) MG/3ML IN SOLN
3.0000 mL | Freq: Four times a day (QID) | RESPIRATORY_TRACT | Status: DC
  Administered 2017-04-04 – 2017-04-09 (×19): 3 mL via RESPIRATORY_TRACT
  Filled 2017-04-04 (×19): qty 3

## 2017-04-04 MED ORDER — MIDAZOLAM HCL 2 MG/2ML IJ SOLN
2.0000 mg | Freq: Once | INTRAMUSCULAR | Status: AC
  Administered 2017-04-04: 2 mg via INTRAVENOUS

## 2017-04-04 MED ORDER — DIPHENHYDRAMINE HCL 50 MG/ML IJ SOLN
50.0000 mg | Freq: Once | INTRAMUSCULAR | Status: AC
  Administered 2017-04-04: 50 mg via INTRAVENOUS
  Filled 2017-04-04: qty 1

## 2017-04-04 MED ORDER — ALBUMIN HUMAN 5 % IV SOLN
INTRAVENOUS | Status: AC
  Filled 2017-04-04: qty 250

## 2017-04-04 MED ORDER — MIDAZOLAM HCL 2 MG/2ML IJ SOLN: 1.0000 mg | INTRAMUSCULAR | Status: DC | PRN

## 2017-04-04 MED ORDER — EPINEPHRINE PF 1 MG/10ML IJ SOSY
PREFILLED_SYRINGE | INTRAMUSCULAR | Status: AC
  Filled 2017-04-04: qty 30

## 2017-04-04 MED ORDER — ROCURONIUM BROMIDE 100 MG/10ML IV SOLN
INTRAVENOUS | Status: DC | PRN
  Administered 2017-04-04 (×4): 50 mg via INTRAVENOUS

## 2017-04-04 MED ORDER — SODIUM TETRADECYL SULFATE 1 % IV SOLN
INTRAVENOUS | Status: AC | PRN
  Administered 2017-04-04: 4 mL

## 2017-04-04 MED ORDER — ETOMIDATE 2 MG/ML IV SOLN
20.0000 mg | Freq: Once | INTRAVENOUS | Status: AC
  Administered 2017-04-04: 20 mg via INTRAVENOUS

## 2017-04-04 MED ORDER — SODIUM CHLORIDE 0.9 % IV SOLN
50.0000 ug/h | INTRAVENOUS | Status: DC
  Administered 2017-04-04: 25 ug/h via INTRAVENOUS
  Administered 2017-04-04 – 2017-04-08 (×7): 50 ug/h via INTRAVENOUS
  Filled 2017-04-04 (×18): qty 1

## 2017-04-04 MED ORDER — MIDAZOLAM HCL 5 MG/5ML IJ SOLN
INTRAMUSCULAR | Status: DC | PRN
  Administered 2017-04-04: 2 mg via INTRAVENOUS
  Administered 2017-04-04: 4 mg via INTRAVENOUS

## 2017-04-04 MED ORDER — PROPOFOL 10 MG/ML IV BOLUS
INTRAVENOUS | Status: AC
  Filled 2017-04-04: qty 20

## 2017-04-04 MED ORDER — SODIUM CHLORIDE 0.9 % IV SOLN
0.0000 ug/min | INTRAVENOUS | Status: DC
  Administered 2017-04-04: 150 ug/min via INTRAVENOUS
  Filled 2017-04-04: qty 1

## 2017-04-04 MED ORDER — SODIUM BICARBONATE 8.4 % IV SOLN
INTRAVENOUS | Status: DC | PRN
  Administered 2017-04-04: 50 mL via INTRAVENOUS

## 2017-04-04 MED ORDER — MIDAZOLAM HCL 2 MG/2ML IJ SOLN
INTRAMUSCULAR | Status: AC
  Administered 2017-04-04: 18:00:00
  Filled 2017-04-04: qty 4

## 2017-04-04 MED ORDER — FENTANYL CITRATE (PF) 250 MCG/5ML IJ SOLN
INTRAMUSCULAR | Status: AC
  Filled 2017-04-04: qty 5

## 2017-04-04 MED ORDER — BISACODYL 10 MG RE SUPP: 10.0000 mg | Freq: Every day | RECTAL | Status: DC | PRN

## 2017-04-04 MED ORDER — FOLIC ACID 5 MG/ML IJ SOLN
1.0000 mg | Freq: Every day | INTRAMUSCULAR | Status: DC
  Administered 2017-04-05 – 2017-04-08 (×4): 1 mg via INTRAVENOUS
  Filled 2017-04-04 (×7): qty 0.2

## 2017-04-04 MED ORDER — PHENYLEPHRINE HCL 10 MG/ML IJ SOLN
INTRAVENOUS | Status: DC | PRN
  Administered 2017-04-04: 40 ug/min via INTRAVENOUS

## 2017-04-04 MED ORDER — FENTANYL BOLUS VIA INFUSION
25.0000 ug | INTRAVENOUS | Status: DC | PRN
  Administered 2017-04-05: 25 ug via INTRAVENOUS
  Filled 2017-04-04: qty 25

## 2017-04-04 MED ORDER — FUROSEMIDE 10 MG/ML IJ SOLN
INTRAMUSCULAR | Status: AC
  Filled 2017-04-04: qty 2

## 2017-04-04 MED ORDER — FENTANYL CITRATE (PF) 100 MCG/2ML IJ SOLN
50.0000 ug | Freq: Once | INTRAMUSCULAR | Status: AC
  Administered 2017-04-04: 50 ug via INTRAVENOUS

## 2017-04-04 MED ORDER — PROPOFOL 10 MG/ML IV BOLUS
INTRAVENOUS | Status: DC | PRN
  Administered 2017-04-04: 30 mg via INTRAVENOUS

## 2017-04-04 MED ORDER — FUROSEMIDE 10 MG/ML IJ SOLN
20.0000 mg | Freq: Once | INTRAMUSCULAR | Status: AC
  Administered 2017-04-04: 20 mg via INTRAVENOUS

## 2017-04-04 MED ORDER — MIDAZOLAM HCL 10 MG/2ML IJ SOLN
INTRAMUSCULAR | Status: DC | PRN
  Administered 2017-04-04 (×2): 1 mg via INTRAVENOUS

## 2017-04-04 MED ORDER — MIDAZOLAM HCL 5 MG/ML IJ SOLN
INTRAMUSCULAR | Status: AC
  Filled 2017-04-04: qty 1

## 2017-04-04 MED ORDER — EPINEPHRINE PF 1 MG/10ML IJ SOSY
PREFILLED_SYRINGE | INTRAMUSCULAR | Status: AC
  Filled 2017-04-04: qty 10

## 2017-04-04 MED ORDER — FENTANYL CITRATE (PF) 100 MCG/2ML IJ SOLN
100.0000 ug | Freq: Once | INTRAMUSCULAR | Status: AC
  Administered 2017-04-04: 100 ug via INTRAVENOUS

## 2017-04-04 MED ORDER — SODIUM CHLORIDE 0.9 % IV SOLN
Freq: Once | INTRAVENOUS | Status: AC
  Administered 2017-04-04: 15:00:00 via INTRAVENOUS

## 2017-04-04 MED ORDER — CHLORHEXIDINE GLUCONATE 0.12% ORAL RINSE (MEDLINE KIT)
15.0000 mL | Freq: Two times a day (BID) | OROMUCOSAL | Status: DC
  Administered 2017-04-04 – 2017-04-08 (×8): 15 mL via OROMUCOSAL

## 2017-04-04 MED ORDER — SODIUM CHLORIDE 0.9 % IV SOLN: INTRAVENOUS | Status: DC

## 2017-04-04 MED ORDER — FENTANYL 2500MCG IN NS 250ML (10MCG/ML) PREMIX INFUSION
25.0000 ug/h | INTRAVENOUS | Status: DC
  Administered 2017-04-04: 100 ug/h via INTRAVENOUS
  Administered 2017-04-05 – 2017-04-07 (×4): 200 ug/h via INTRAVENOUS
  Filled 2017-04-04 (×5): qty 250

## 2017-04-04 MED ORDER — NOREPINEPHRINE BITARTRATE 1 MG/ML IV SOLN
0.0000 ug/min | INTRAVENOUS | Status: DC
  Administered 2017-04-04: 8 ug/min via INTRAVENOUS
  Filled 2017-04-04 (×2): qty 4

## 2017-04-04 MED ORDER — PROPOFOL 500 MG/50ML IV EMUL
INTRAVENOUS | Status: DC | PRN
  Administered 2017-04-04: 75 ug/kg/min via INTRAVENOUS

## 2017-04-04 MED ORDER — NOREPINEPHRINE BITARTRATE 1 MG/ML IV SOLN
0.0000 ug/min | INTRAVENOUS | Status: DC
  Filled 2017-04-04: qty 4

## 2017-04-04 SURGICAL SUPPLY — 21 items

## 2017-04-04 NOTE — Progress Notes (Signed)
Versed 3mg  wasted with Hermina Barters, RN following bedside EGD.

## 2017-04-04 NOTE — Care Management Important Message (Signed)
Important Message  Patient Details  Name: John Warren MRN: [id number] Date of Birth: June 07, 1951   Medicare Important Message Given:  Yes    [name] May 04/04/2017, 11:16 AM

## 2017-04-04 NOTE — Anesthesia Preprocedure Evaluation (Addendum)
Anesthesia Evaluation  Patient identified by MRN, date of birth, ID band Patient awake    Reviewed: Allergy & Precautions, H&P , NPO status , Patient's Chart, lab work & pertinent test results  Airway Mallampati: II  TM Distance: >3 FB Neck ROM: full    Dental  (+) Poor Dentition, Dental Advisory Given, Missing   Pulmonary shortness of breath and with exertion, COPD, former smoker,    breath sounds clear to auscultation       Cardiovascular hypertension, Pt. on medications + dysrhythmias Supra Ventricular Tachycardia  Rhythm:regular Rate:Normal     Neuro/Psych Anxiety    GI/Hepatic GERD  Medicated,(+) Cirrhosis     substance abuse  alcohol use,   Endo/Other    Renal/GU      Musculoskeletal   Abdominal   Peds  Hematology  (+) Blood dyscrasia, anemia ,   Anesthesia Other Findings   Reproductive/Obstetrics                            Anesthesia Physical Anesthesia Plan  ASA: III  Anesthesia Plan: MAC   Post-op Pain Management:    Induction: Intravenous  Airway Management Planned: Simple Face Mask  Additional Equipment:   Intra-op Plan:   Post-operative Plan:   Informed Consent: I have reviewed the patients History and Physical, chart, labs and discussed the procedure including the risks, benefits and alternatives for the proposed anesthesia with the patient or authorized representative who has indicated his/her understanding and acceptance.     Plan Discussed with: CRNA, Anesthesiologist and Surgeon  Anesthesia Plan Comments:         Anesthesia Quick Evaluation

## 2017-04-04 NOTE — Progress Notes (Signed)
Dr Marcie Bal in to start central vascular access d/t lack of venous access and unable to obtain arterial line

## 2017-04-04 NOTE — Progress Notes (Signed)
ENDO called staff and let us know that patient is not coming back on this floor. He will be transferred to ICU from Endo (2M02). Writer called 2 M and gave report to the nurse who is going to receive the patient.

## 2017-04-04 NOTE — Progress Notes (Signed)
Patient called this RN to look at Bowel movement after drinking bowel prep.  Bowel movement was bright red and patient was concerned that it was due to the bowel prep.  Patient now refusing to finish the bowel prep with about 440 mL left to drink.  On-call MD, Hetty Ely notified and RN was advised to order labs Hemoglobin and Hematocrit if patient continues to have bloody bowel movements.  RN will continue to educate patient on the importance of finishing bowel prep.  RN will continue to monitor patient and notify as need.

## 2017-04-04 NOTE — Progress Notes (Signed)
Subjective:   Patient was already in endoscopy early this morning so I was unable to evaluate him prior to his procedure. Per chart review, patient had had recurrence of bloody bowel movements (2-3) overnight after starting bowel prep. We were alerted by GI that patient had soft BPs during procedure though was maintained with fluid; endoscopy showed large amounts of old and fresh blood; diverticuli were noted but no source of bleed in lower GI tract was noted today. GI had reviewed prior EGD imaging and had concluded that his gastric varix did show stigmata of recent bleeding and that was the initial and likely current cause of his bleeding. After getting to recovery, patient had progressive hypotension despite fluids and albumin so he was started on phenylephrine. We evaluated him at bedside - SBP as low as 49 on cuff; he was minimally responsive.  Unable to obtain ROS due to encephalopathy 2/2 shock.  Objective:  Vital signs in last 24 hours: Vitals:   04/04/17 0935 04/04/17 0940 04/04/17 1000 04/04/17 1045  BP: (!) 87/39 (!) 96/35 (!) 85/65   Pulse: 87 83 (!) 106   Resp: (!) 21 16 (!) 21   Temp:    97.8 F (36.6 C)  TempSrc:    Oral  SpO2: 99% 97% 97%   Weight:      Height:       Constitutional: Ill appearing  Neuro: responds to sternal rub, PERRL though sluggish CV: distant heart sounds, RRR, no murmurs, rubs or gallops appreciated; with 2+ pitting LE L>R Resp: no increased work of breathing; diminished breath sounds throughout, satting 88% on RA though was placed on NRB Abd: distended, soft, decreased bowel sounds  Assessment/Plan:  Principal Problem:   GI bleeding Active Problems:   Cirrhosis, alcoholic (HCC)   AVM (arteriovenous malformation) of colon   Sigmoid diverticulosis   Portal hypertensive gastropathy (HCC)   Thrombocytopenia (HCC)  Hemorrhagic Shock 2/2 Acute Recurrent GI Bleed:  Patient began having bloody BM's overnight after administration of Bowel Prep;  Hgb/Hct were stable this morning. During colonoscopy, large amounts of old and fresh blood were noted but no active bleed in lower GI tract was noted; consensus from GI seems to be that the gastric varix was the initial and current source though not noted to be actively bleeding on initial EGD (may have had stigmata of recent bleed). Hypotensive despite fluid resuscitation during procedure and was started on phenylephrine. --transfer to ICU; discussed with attending --stat Type and Cross and transfuse 2U pRBCs --receiving albumin and IVF --stat coags, CBC; may need FFPs --transfuse as needed to maintain hgb > 8.0  --Currently on phenylephrine with continued hypotension; will likely need further vasopressors; central line and A line were in process of being placed  H/o dCHF: Patient had been diuresing well prior to procedure today.  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 BRTO planned for tomorrow  --hold diuretics in setting of hemorrhagic shock --lactulose BID  Patient discussed with Dr. Corrie Dandy and PA from [facility]; he will be transferred to ICU for further management and we will follow along. Significant other Marzetta Merino) was contacted and updated on recent events; she states only family is patient's sister whom she will try and get in contact with.    Diet: NPO  VTE ppx: SCD's Code: FULL  Dispo: Anticipated discharge in approximately 4-5 day(s).   Alphonzo Grieve, MD 04/04/2017,  11:05 AM IMTS - PGY1 Pager [phone]

## 2017-04-04 NOTE — Procedures (Signed)
Intubation Procedure Note John Warren [id number] 10/26/1951  Procedure: Intubation Indications: Respiratory insufficiency  Procedure Details Consent: Unable to obtain consent because of emergent medical necessity. Time Out: Verified patient identification, verified procedure, site/side was marked, verified correct patient position, special equipment/implants available, medications/allergies/relevent history reviewed, required imaging and test results available.  Performed Premedicated with fentanyl 100 mcg, versed 2 mg, and etomidate 20 mg.   Maximum sterile technique was used including gloves, hand hygiene and mask.  MAC and 4/ glidescope  Grade 1 airway view.   7.5 ETT placed and secured at 24 cm at the lip with equal bilateral breath sounds, absent epigastric sounds, and positive color change on ETCO2.   Evaluation Hemodynamic Status: BP stable throughout; O2 sats: Improved  Patient's Current Condition: stable Complications: No apparent complications Patient did tolerate procedure well. Chest X-ray ordered to verify placement.  CXR: pending.  Procedure performed under direct supervision of Dr. Corrie Dandy.     Kennieth Rad, AGACNP-BC [facility] Pgr: [phone] or if no answer [phone] 04/04/2017, 11:26 AM  Pt was intubated for resp distress.   Monica Becton, MD 04/04/2017, 2:59 PM  Pulmonary and Critical Care Pager [phone] After 3 pm or if no answer, call [phone]

## 2017-04-04 NOTE — Consult Note (Signed)
PULMONARY / CRITICAL CARE MEDICINE   Name: John Warren MRN: [id number] DOB: 1951-07-21    ADMISSION DATE:  03/29/2017 CONSULTATION DATE:  04/04/17  REFERRING MD:  Evette Doffing  CHIEF COMPLAINT:  GI Bleed  HISTORY OF PRESENT ILLNESS: Pt is encephelopathic; therefore, this HPI is obtained from chart review. John Warren is a 66 y.o. male with PMH as outlined below including but not limited to EtOH cirrhosis, portal gastropathy, and prior GI bleeds (3 admissions since Jan 2017, previously due to AVM and sigmoid diverticulosis). He was admitted 03/30/17 with recurrent GI bleed that started the day prior.  He was in his usual state of health then had a dark tarry BM followed by BRBPR a few hours later.  He had not been on any NSAID's, antiplatelets, anticoagulants.  He supposedly had quit drinking 1 year ago.  He was admitted and was given PRBC's and FFP and also started on PPI and octreotide.  He had tagged bleeding scan that demonstrated active bleed but it was difficult to localize the source of bleeding. 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  Based on CT findings as well as EGD and colonoscopy (see below), BRTO was recommended and planned for 04/05/17.  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  Capsule endoscopy performed 04/03/17 and demonstrated small non-bleeding AVM in mid small bowel and one non-specific erosion in the small bowel, no evidence of blood in the small bowel or colon (though poor visualization).  Colonoscopy was performed 04/01/17 which demonstrated large amount of fresh and old blood along with clots in the entire colon along with diverticulosis in the sigmoid and descending colon without active bleeding, non bleeding  internal hemorrhoids.  Source of bleeding felt to be gastric varices.  Following colonoscopy (which was performed under MAC anesthesia), pt had worsening hypotension with SBP as low as 60's.  Anesthesia administered 3 bags of albumin (12.5g) and started phenylephrine infusion.  He was also given 3u PRBC's.  CVL and A-line were placed and PCCM was called for further management.  BP responded well to albumin, neo, PRBC's (SBP currently 130's).  Neo was dropped to 38mg and pt was transferred to ICU.  After transfer to ICU, he decompensated further and required intubation due to respiratory insufficiency.   PAST MEDICAL HISTORY :  He  has a past medical history of Alcohol abuse; Anemia; AVM (arteriovenous malformation) of colon; Cirrhosis of liver ([place]; Colon polyps; COPD (chronic obstructive pulmonary disease) ([place]; Diastolic CHF ([place]; GERD (gastroesophageal reflux disease); GI bleed (01/2016); Hypertension; Hypokalemia; Internal hemorrhoids; Portal hypertensive gastropathy ([place]; Sigmoid diverticulosis; SVT (supraventricular tachycardia) ([place]; Symptomatic anemia; Thrombocytopenia ([place]; and Tobacco use.  PAST SURGICAL HISTORY: He  has a past surgical history that includes Cataract extraction; Hernia repair; Esophagogastroduodenoscopy (N/A, 07/15/2015); Colonoscopy (N/A, 07/15/2015); Esophagogastroduodenoscopy (N/A, 12/30/2015); Esophagogastroduodenoscopy (egd) with propofol (N/A, 01/24/2016); Hot hemostasis (N/A, 01/24/2016); and Esophagogastroduodenoscopy (N/A, 03/30/2017).  Allergies  Allergen Reactions  . Penicillins Rash    Has patient had a PCN reaction causing immediate rash, facial/tongue/throat swelling, SOB or lightheadedness with hypotension: Yes Has patient had a PCN reaction causing severe rash involving mucus membranes or skin necrosis: No Has patient had a PCN reaction that required hospitalization: No Has patient had a PCN reaction occurring within the last 10 years: No If all of  the  above answers are "NO", then may proceed with Cephalosporin use. Had CTX before    No current facility-administered medications on file prior to encounter.    Current Outpatient Prescriptions on File Prior to Encounter  Medication Sig  . ferrous sulfate 325 (65 FE) MG tablet Take 1 tablet (325 mg total) by mouth 3 (three) times daily with meals. (Patient taking differently: Take 325 mg by mouth every evening. )  . folic acid (FOLVITE) 1 MG tablet Take 1 tablet (1 mg total) by mouth daily.  . furosemide (LASIX) 80 MG tablet Take 80 mg by mouth daily.  . nitroGLYCERIN (NITROSTAT) 0.4 MG SL tablet Place 0.4 mg under the tongue every 5 (five) minutes as needed for chest pain.  . potassium chloride SA (K-DUR,KLOR-CON) 20 MEQ tablet Take 20 mEq by mouth every morning.   Marland Kitchen spironolactone (ALDACTONE) 50 MG tablet Take 1 tablet (50 mg total) by mouth daily. (Patient taking differently: Take 50 mg by mouth 2 (two) times daily. )  . thiamine 100 MG tablet Take 1 tablet (100 mg total) by mouth daily.  Marland Kitchen lactulose (CHRONULAC) 10 GM/15ML solution Take 30 mLs (20 g total) by mouth daily. (Patient not taking: Reported on 03/29/2017)  . omeprazole (PRILOSEC) 40 MG capsule Take 1 capsule (40 mg total) by mouth every evening. (Patient not taking: Reported on 03/29/2017)  . rifaximin (XIFAXAN) 550 MG TABS tablet Take 1 tablet (550 mg total) by mouth 2 (two) times daily. (Patient not taking: Reported on 03/29/2017)    FAMILY HISTORY:  His indicated that his mother is deceased. He indicated that his father is deceased. He indicated that his sister is alive.    SOCIAL HISTORY: He  reports that he quit smoking about 8 years ago. His smoking use included Cigarettes. He has a 80.00 pack-year smoking history. He has never used smokeless tobacco. He reports that he drinks alcohol. He reports that he does not use drugs.  REVIEW OF SYSTEMS:   Unable to obtain as pt is encephalopathic.  SUBJECTIVE:  On NRB, sats  intermittently in the 79s.  VITAL SIGNS: BP (!) 96/35   Pulse 83   Temp 97.7 F (36.5 C) (Oral)   Resp 16   Ht _0  (1.803 m)   Wt 93.9 kg (207 lb)   SpO2 97%   BMI 28.87 kg/m   HEMODYNAMICS:    VENTILATOR SETTINGS:    INTAKE / OUTPUT: I/O last 3 completed shifts: In: 2302.3 [P.O.:1920; I.V.:382.3] Out: 1750 [Urine:1750]   PHYSICAL EXAMINATION: General: Chronically ill appearing male, critically ill. Neuro: [place]/AT. Sedated. HEENT: [place]/AT.  MMM. Cardiovascular: RRR, no M/R/G. Lungs: Coarse crackles throughout. Abdomen: BS x 4, S/NT/ND. Musculoskeletal: No gross deformities.  2+ edema. Skin: Warm, dry.  LABS:  BMET  Recent Labs Lab 04/02/17 0354 04/03/17 0525 04/04/17 0300  NA 134* 135 135  K 3.3* 3.7 4.8  CL 106 104 103  CO2 _1 BUN _2 CREATININE 0.93 0.85 0.94  GLUCOSE 121* 113* 136*    Electrolytes  Recent Labs Lab 04/02/17 0354 04/03/17 0525 04/04/17 0300  CALCIUM 7.4* 7.6* 7.9*  MG  --   --  1.8    CBC  Recent Labs Lab 04/02/17 0354 04/02/17 1205 04/03/17 0525 04/04/17 0300  WBC 8.8  --  6.6 12.2*  HGB 7.9* 8.4* 8.7* 8.3*  HCT 23.6* 25.1* 26.1* 24.7*  PLT 84*  --  83* 118*    Coag's  Recent Labs Lab  03/29/17 1855 03/31/17 0352  INR 1.34 1.32    Sepsis Markers  Recent Labs Lab 03/29/17 2214  LATICACIDVEN 1.91*    ABG No results for input(s): PHART, PCO2ART, PO2ART in the last 168 hours.  Liver Enzymes  Recent Labs Lab 03/29/17 1634 03/30/17 0228 04/01/17 0301  AST 32 33 26  ALT _0 ALKPHOS 86 70 52  BILITOT 1.3* 1.1 0.7  ALBUMIN 3.4* 3.0* 2.3*    Cardiac Enzymes  Recent Labs Lab 03/30/17 2020 03/31/17 0251 03/31/17 0812  TROPONINI 0.06* 0.06* 0.05*    Glucose  Recent Labs Lab 03/29/17 2102  GLUCAP 134*    Imaging No results found.   STUDIES:  EGD 4/28 >  type 1 isolated gastric varices without bleeding, single non-bleeding angiodysplastic lesion in the stomach,  normal duodenum. Capsule endoscopy 5/2 > small non-bleeding AVM in mid small bowel and one non-specific erosion in the small bowel, no evidence of blood in the small bowel or colon (though poor visualization). Colonoscopy 4/30 > large amount of fresh and old blood along with clots in the entire colon along with diverticulosis in the sigmoid and descending colon without active bleeding, non bleeding internal hemorrhoids.  Source of bleeding felt to be gastric varices.  CULTURES: Blood 4/28 > neg  ANTIBIOTICS: None.  SIGNIFICANT EVENTS: 4/28 > admit 5/3 > decompensation following colonoscopy > transferred to ICU > intubated  LINES/TUBES: ETT 5/3 >  R IJ CVL 5/3 >  R radial a line 5/3 >   DISCUSSION: 66 y.o. male with hx EtOH cirrhosis and portal HTN as well as previous GI bleed (3 admissions since Jan 2017).  Admitted 4/28 with GI bleed.  EGD showed gastric varices without bleeding, capsule endo showed small non bleeding AVM in small bowel, colonoscopy showed large amount of fresh and old blood throughout entire colon along with diverticulosis. After colonoscopy, he had sudden drop in BP; therefore, received aggressive IVF's and PRBC's.  He was then transferred to ICU where he required intubation for respiratory insufficiency.  ASSESSMENT / PLAN:  PULMONARY A: Acute pulmonary edema with respiratory insufficiency - requiring intubation after arrival to ICU. COPD without evidence of exacerbation. Tobacco dependence. P:   Full vent support. Wean as able. VAP prevention measures. Hold SBT until further endo / IR procedures completed. Lasix 55m now. CXR in AM.  CARDIOVASCULAR A:  Hemorrhagic shock - due to GI bleed, unclear etiology at this point. Hx SVT, HTN, dCHF (Echo from Jan 2017 with EF 65-70%, G2DD). P:  Continue neosynephrine for goal MAP > 65. PRBC's as per heme section. Trend troponins. 638mlasix now (due to pulm edema).  RENAL A:   Hypocalcemia. P:   1g Ca  gluconate. BMP in AM.  GASTROINTESTINAL A:   GI bleed - unclear etiology at this point.  GI and IR on board.  EGD, capsule endoscopy, colonoscopy all unable to identify source of active bleed.  IR were planning for BRTO 5/4 before acute issues from today (transfer to ICU, intubation, etc). GI prophylaxis. Hx EtOH cirrhosis, portal HTN / gastropathy, hepatic encephalopathy. P:   GI called by IR - recommending repeat EGD this AM. If EGD negative, IR to perform angiogram. SUP: Pantoprazole. NPO. Continue preadmission lactulose, rifaximin.  HEMATOLOGIC A:   Acute blood loss anemia - see GI section. Thrombocytopenia. VTE Prophylaxis. P:  Transfuse for Hgb < 7 SCD's only. CBC in AM.  INFECTIOUS A:   No indication of infection. P:   Monitor clinically.  ENDOCRINE A:   No acute issues.   P:   No interventions required.  NEUROLOGIC A:   Acute encephalopathy. Hx EtOH abuse (reportedly stopped 1 year ago). P:   Sedation: Fentanyl gtt / Midazolam PRN. RASS goal: -1. Daily WUA. Continue preadmission thiamine / folate.  Family updated: None available.  Interdisciplinary Family Meeting v Palliative Care Meeting:  Due by: 04/11/17.  CC time: 40 min.   Montey Hora, [facility] Pager: [phone]  or [phone] 04/04/2017, 10:32 AM    ATTENDING NOTE / ATTESTATION NOTE :   I have discussed the case with the resident/APP  Montey Hora and Internal Medicine team.   I agree with the resident/APP's  history, physical examination, assessment, and plans.    I have edited the above note and modified it according to our agreed history, physical examination, assessment and plan.   John Warren is a 66 y.o. male with PMH of  EtOH cirrhosis, portal gastropathy, and prior GI bleeds (3 admissions since Jan 2017, previously due to AVM and sigmoid diverticulosis). He was admitted 03/30/17 with recurrent GI bleed that started the day  prior.  He was in his usual state of health then had a dark tarry BM followed by BRBPR a few hours later.  He had not been on any NSAID's, antiplatelets, anticoagulants.  He supposedly had quit drinking 1 year ago.  He was admitted and was given PRBC's and FFP and also started on PPI and octreotide.  He had tagged bleeding scan that demonstrated active bleed but it was difficult to localize the source of bleeding. 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  Based on CT findings as well as EGD and colonoscopy (see below), BRTO was recommended and planned for 04/05/17.  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  Capsule endoscopy performed 04/03/17 and demonstrated small non-bleeding AVM in mid small bowel and one non-specific erosion in the small bowel, no evidence of blood in the small bowel or colon (though poor visualization).  Colonoscopy was performed 04/01/17 which demonstrated large amount of fresh and old blood along with clots in the entire colon along with diverticulosis in the sigmoid and descending colon without active bleeding, non bleeding internal hemorrhoids.  Source of bleeding felt to be gastric varices.  Pt had colonoscopy on 5/3 which did not show any obvious source of bleeding but he had blood (dark and bright red) in his colon.  Following colonoscopy on 5/3 (which was performed under MAC anesthesia), pt had worsening hypotension with SBP as low as 60's.  Anesthesia administered 3 bags of albumin (12.5g) and started phenylephrine infusion.  He was also given 3u PRBC's.  CVL and A-line were placed and PCCM was called for further management.  BP responded well to albumin, neo, PRBC's (SBP currently 130's).  Neo was dropped to  84mg and pt was transferred to ICU.  After transfer to ICU, he decompensated further and required intubation due to respiratory insufficiency. He was noted to be congested with all IVF and volume resuscitation.   Vitals:  Vitals:   04/04/17 1330 04/04/17 1345 04/04/17 1347 04/04/17 1400  BP:  (!) 101/46    Pulse:  (!) 108 (!) 104 94  Resp: 13 (!)  22 (!) 27 (!) 28  Temp: (!) 95.7 F (35.4 C) (!) 95.9 F (35.5 C) (!) 95.9 F (35.5 C)   TempSrc:      SpO2:  98% 98% 97%  Weight:      Height:        Constitutional/General: well-nourished, well-developed, intubated, sedated, not in any distress  Body mass index is 28.87 kg/m. Wt Readings from Last 3 Encounters:  04/04/17 93.9 kg (207 lb)  01/26/16 91.4 kg (201 lb 8 oz)  01/02/16 88.9 kg (196 lb)    HEENT: PERLA, icteric sclerae. (-) Oral thrush. Intubated, ETT in place  Neck: No masses. Midline trachea. No JVD, (-) LAD. (-) bruits appreciated.  Respiratory/Chest: Grossly normal chest. (-) deformity. (-) Accessory muscle use.  Symmetric expansion. Diminished BS on both lower lung zones. (-) wheezing,  Rhonchi Crackles in both lung fields prior to intubation.  (-) egophony  Cardiovascular: Regular rate and  rhythm, heart sounds normal, no murmur or gallops,  Gr 2  peripheral edema  Gastrointestinal:  Dec bowel sounds. Soft, non-tender. No hepatosplenomegaly.  (-) masses.   Musculoskeletal:  Normal muscle tone.   Extremities: Grossly normal. (-) clubbing, cyanosis.  Gr 2  edema  Skin: (-) rash,lesions seen.   Neurological/Psychiatric : sedated, intubated. CN grossly intact. (-) lateralizing signs.     CBC Recent Labs     04/02/17  0354   04/03/17  0525  04/04/17  0300  04/04/17  1011  04/04/17  1220  WBC  8.8   --   6.6  12.2*   --    --   HGB  7.9*   < >  8.7*  8.3*  7.1*  11.5*  HCT  23.6*   < >  26.1*  24.7*  21.0*  35.2*  PLT  84*   --   83*  118*   --    --    < > = values in this interval not  displayed.    Coag's No results for input(s): APTT, INR in the last 72 hours.  BMET Recent Labs     04/02/17  0354  04/03/17  0525  04/04/17  0300  04/04/17  1011  NA  134*  135  135  135  K  3.3*  3.7  4.8  4.2  CL  106  104  103  103  CO2  _0 --   BUN  _1 CREATININE  0.93  0.85  0.94  0.80  GLUCOSE  121*  113*  136*  128*    Electrolytes Recent Labs     04/02/17  0354  04/03/17  0525  04/04/17  0300  CALCIUM  7.4*  7.6*  7.9*  MG   --    --   1.8    Sepsis Markers No results for input(s): PROCALCITON, O2SATVEN in the last 72 hours.  Invalid input(s): LACTICACIDVEN  ABG Recent Labs     04/04/17  1107  04/04/17  1204  PHART  7.150*  7.242*  PCO2ART  49.8*  47.0  PO2ART  43.0*  182.0*    Liver Enzymes No results for input(s): AST, ALT, ALKPHOS, BILITOT, ALBUMIN in the last 72 hours.  Cardiac Enzymes No results for input(s): TROPONINI, PROBNP in the last 72 hours.  Glucose Recent Labs     04/04/17  1209  GLUCAP  164*    Imaging Dg Chest Port 1  View  Result Date: 04/04/2017 CLINICAL DATA:  Status post endotracheal intubation. EXAM: PORTABLE CHEST 1 VIEW COMPARISON:  Radiographs of Apr 02, 2017. FINDINGS: The heart size and mediastinal contours are within normal limits. Endotracheal tube is seen projected over tracheal air shadow with distal tip 7.5 cm above the carina. Right internal jugular catheter is noted with distal tip in expected position of the SVC. No pneumothorax is noted. There is interval development of diffuse reticulonodular opacities throughout both lungs most consistent with inflammation or pneumonia or possibly edema. Mild right pleural effusion is noted. The visualized skeletal structures are unremarkable. IMPRESSION: Endotracheal tube and right internal jugular venous catheter in grossly good position. Interval development of bilateral diffuse lung opacities as described above, most consistent with inflammation or  pneumonia, or less likely edema. Mild right pleural effusion is noted. Electronically Signed   By: Marijo Conception, M.D.   On: 04/04/2017 11:36    Assessment/Plan: Hemorrhagic shock 2/2 UGI bleed likely 2/2 Bleeding Gastric + Esophageal varices. On going volume/blood transfusion - S/P 3 u pRBC after colonoscopy.  - GI and IR on board.  Plan for emergent TIPS for portal venous decompression and probably a BRTO for direct treatment of the gastric varices per IR and GI. Appreciate IR and GI recommendations and help - cont octreotide drip - cont PPI BID - keep NPO - check Hb and Hct q6. Keep Hb > 7.  - checking coags. Last coag were N.  Plt reassuring   Acute Hypoxemic respiratory Failure 2/2 acute pulm edema with significant volume resuscitation. R/O demand ischemia. Possible TRALI - cont vent support - got lasix x 1 dose this noon. Needs more lasix if BP allows.  - trend troponin - trend lactate.   Liver Cirrhosis - as above  Best practice : SCD for DVT prophylaxis. On PPI.  I spent  30  minutes of Critical Care time with this patient today. This is my time spent independent of the APP or resident.   Family : family updated by ICU team today.   Monica Becton, MD 04/04/2017, 3:00 PM [facility] Pager [phone] After 3 pm or if no answer, call [phone]

## 2017-04-04 NOTE — Progress Notes (Signed)
   [facility] PCCM  > Pt is  in IR now undergoing TIPS, BRTO > He has received 6 u pRBC + 3 u FFP + 1 unit plt. Also received IVF. He is getting lasix intermittently and is responding well.  > He is on levophed drip.  He is on 100% Fio2 and PEEP 8 (anesthesia machine can not go up further on peep)  Plan : 1. Cont diuresis as BP tolerates 2. Cont current vent set up.  Once transferred to 1M, increase PEEP to 12-14 cm water  as tolerated. Keep on 100% Fio2.  3. Keep sedated.  4. If he will be down in IR longer, may need to switch out to a different vent.  5. Plan d/w Anesthesia and Dr. Oletta Darter.  6.  I was looking for GF but could not find her and she does not have a cp.   Monica Becton, MD 04/04/2017, 4:49 PM  Pulmonary and Critical Care Pager [phone] After 3 pm or if no answer, call [phone]

## 2017-04-04 NOTE — Progress Notes (Signed)
[facility] Progress Note Patient Name: CHAWN SPRAGGINS DOB: 04/25/1951 MRN: [id number]   Date of Service  04/04/2017  HPI/Events of Note  Troponin = 3.85. Patient admitted with bleeding gastric varices. Now s/p TIPS and BATO of gastric varices. DDX: NSTEMI vs demand ischemia. Remains tachycardic in Currently on Phenylephrine and Norepinephrine IV infusion. Can't use B-Blocker d/t hypotension. Can't use ASA or Heparin IV infusion d/t GI bleeding. EKG at about noon >> Sinus Tachycardia with HR = 110. ST depression in V3-V5 c/w subendocardial ischemia. Severely doubt that cardiology would entertain L heart cath in his current clinical state.  eICU Interventions  Will order: 1. Continue to trend troponin.  2. 12 Lead EKG now. 3. 2D Cardiac Echo in AM to assess wall motion.  4. Would wean Norepinephrine IV infusion first before weaning Phenylephrine.      Intervention Category Intermediate Interventions: Diagnostic test evaluation  Mylinda Brook [place] 04/04/2017, 9:08 PM

## 2017-04-04 NOTE — Progress Notes (Signed)
BP continuously monitored via art line during bedside EGD in 2M02. Systolic BP ranged from 518-34 during EGD. Patient remained on neosynephrine drip per ICU RN.

## 2017-04-04 NOTE — Op Note (Addendum)
[facility] Patient Name: John Warren Procedure Date : 04/04/2017 MRN: [id number] Attending MD: Ronnette Juniper , MD Date of Birth: Mar 12, 1951 CSN: [id number] Age: 66 Admit Type: Inpatient Procedure:                Upper GI endoscopy Indications:              Active gastrointestinal bleeding Providers:                Ronnette Juniper, MD, Cletis Athens, Technician, Kingsley Plan, RN Referring MD:              Medicines:                Monitored Anesthesia Care Complications:            No immediate complications. Estimated Blood Loss:     None from the manuever, but patient has active                            bleeding from gastric varix. Procedure:                Pre-Anesthesia Assessment:                           - Prior to the procedure, a History and Physical                            was performed, and patient medications and                            allergies were reviewed. The patient's tolerance of                            previous anesthesia was also reviewed. The risks                            and benefits of the procedure and the sedation                            options and risks were discussed with the patient.                            All questions were answered, and informed consent                            was obtained. Prior Anticoagulants: The patient has                            taken no previous anticoagulant or antiplatelet                            agents. ASA Grade Assessment: IV - A patient with  severe systemic disease that is a constant threat                            to life. After reviewing the risks and benefits,                            the patient was deemed in satisfactory condition to                            undergo the procedure.                           - Prior to the procedure, a History and Physical                            was performed, and patient medications and                             allergies were reviewed. The patient's tolerance of                            previous anesthesia was also reviewed. The risks                            and benefits of the procedure and the sedation                            options and risks were discussed with the patient.                            All questions were answered, and informed consent                            was obtained. Prior Anticoagulants: The patient has                            taken no previous anticoagulant or antiplatelet                            agents. ASA Grade Assessment: IV - A patient with                            severe systemic disease that is a constant threat                            to life. After reviewing the risks and benefits,                            the patient was deemed in satisfactory condition to                            undergo the procedure.  After obtaining informed consent, the endoscope was                            passed under direct vision. Throughout the                            procedure, the patient's blood pressure, pulse, and                            oxygen saturations were monitored continuously.The                            upper GI endoscopy was accomplished without                            difficulty. The patient tolerated the procedure                            well. The [id number]) scope was introduced                            through the mouth, and advanced to the second part                            of duodenum. Scope In: Scope Out: Findings:      Grade II varices were found in the middle third of the esophagus and in       the lower third of the esophagus. They were small in size.      Type 1 isolated gastric varices (IGV1, varices located in the fundus)       with spurting blood were found in the cardia. There were stigmata of       recent bleeding. They were medium in largest  diameter.      A prominent red wale sign was noted on one of the gastric varix. During       withdrawl of the scope fresh blood was noted in the gastric cavity. On       retroflexion, active spurting was noted.      During to brisk bleeding and large amount of fresh blood, suctioning did       not help in making the visualization better. At the end of the       procedure, clots were noted in the gastric cavity, again interfering       with visualization.      A single 5 mm non bleeding angioectasia was found in the gastric body.      The duodenal bulb, first portion of the duodenum and second portion of       the duodenum were normal. Impression:               - Grade II esophageal varices.                           - Type 1 isolated gastric varices (IGV1, varices                            located in the fundus), spurting  blood.                           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                           - Normal duodenal bulb, first portion of the                            duodenum and second portion of the duodenum.                           - No specimens collected. Moderate Sedation:      Moderate (conscious) sedation was personally administered by the       endoscopist. The following parameters were monitored: oxygen saturation,       heart rate, blood pressure, respiratory rate, EKG, adequacy of pulmonary       ventilation, and response to care. Recommendation:           - Refer to an interventional radiologist today,                            spoke with Dr.Henn,who plans to take to patient for                            BRTO today.                           - Transfuse 2 units of blood and 2 units of                            platelets, monitor H and H and INR, and transfuse                            accordingly.                           - Patient's prognosis remains guarded.                           - Make the patient NPO [Start day].                            - Continue present medications. Procedure Code(s):        --- Professional ---                           [phone], Esophagogastroduodenoscopy, flexible,                            transoral; diagnostic, including collection of                            specimen(s) by brushing or washing, when performed                            (  separate procedure) Diagnosis Code(s):        --- Professional ---                           I85.00, Esophageal varices without bleeding                           I86.4, Gastric varices                           K92.2, Gastrointestinal hemorrhage, unspecified                           K31.819, Angiodysplasia of stomach and duodenum                            without bleeding CPT copyright 2016 American [employer]. All rights reserved. The codes documented in this report are preliminary and upon coder review may  be revised to meet current compliance requirements. Ronnette Juniper, MD 04/04/2017 1:17:31 PM This report has been signed electronically. Number of Addenda: 0

## 2017-04-04 NOTE — Progress Notes (Signed)
Internal Medicine Attending:   I saw and examined the patient. I reviewed the resident's note and I agree with the resident's findings and plan as documented in the resident's note.  We were called to the bedside in endoscopy recovery because of persistent post-procedure hypotension. To review, the patient has been admitted since 4/27 for evaluation of GI bleeding in the setting of known cirrhosis. This is his third admission in the last year for bleeding, which previously has been attributed to AVMs. His bleeding spontaneously resolved around 4/28, and he was having one brown stool daily during the admission. EGD on 4/28 showed gastric and esophageal varices which were initially thought to be non-bleeding. He had a capsule endoscopy on 5/1 which was showed non-bleeding AVMs. He underwent colonoscopy today. During the bowel prep this morning he noticed increasing bloody stools. Dr. Therisa Doyne called me after the procedure, she encountered a large amount of fresh blood and clots in the colon, but no colonic source of bleeding. Likely, the gastric varix started bleeding this morning and post-procedure hypotension was due to volume depletion and blood loss.   On exam the patient was somnolent, but responsive and protecting his airway. BP was around 60/30. He was warm in his extremities. He had a 16 gauge peripheral IV and we gave him three bottles of albumin and two liters of LR. He had phenylephrine running into the PIV at 200 mcg/hr. We transfused one unit of PRBC. He was not having any blood stool output or hematemesis at the time. Dr. Marcie Bal placed a right IJ central line. We consulted PCCM and I updated him on the hospital course at the bedside. The patient will be transferred to the MICU. VIR has been consulted and is planning on treating the gastric varix with BRTO tomorrow, but may need to complete today if he has persistent bleeding.   I personally spent 35 minutes at the bedside in critical care time  managing the above interventions and coordinating care with GI, Anesthesia, and PCCM providers.

## 2017-04-04 NOTE — Transfer of Care (Signed)
Immediate Anesthesia Transfer of Care Note  Patient: John Warren  Procedure(s) Performed: Procedure(s): COLONOSCOPY WITH PROPOFOL (N/A)  Patient Location: ICU  Anesthesia Type:MAC  Level of Consciousness: awake  Airway & Oxygen Therapy: Patient Spontanous Breathing and NRB mask  Post-op Assessment: Report given to RN  Post vital signs: Reviewed and stable  Last Vitals:  Vitals:   04/04/17 1120 04/04/17 1122  BP:  (!) 111/40  Pulse: (!) 131 (!) 126  Resp: 18 (!) 21  Temp:      Last Pain:  Vitals:   04/04/17 1045  TempSrc: Oral  PainSc:       Patients Stated Pain Goal: 1 (89/37/34 2876)  Complications: No apparent anesthesia complications

## 2017-04-04 NOTE — Progress Notes (Signed)
[place] Progress Note Patient Name: John Warren DOB: 03/17/1951 MRN: [id number]   Date of Service  04/04/2017  HPI/Events of Note  Repeat EKG - Sinus Tachycardia - HR = 134. Non specific ST-T changes. Now off of Norepinephrine IV infusion. May be able to B-Block if able to wean Phenylephrine IV infusion.   eICU Interventions  Continue present management. If next Troponin not decreasing will need to consult Cardiology.      Intervention Category Intermediate Interventions: Diagnostic test evaluation  [name],Steven Cornelia Copa 04/04/2017, 9:40 PM

## 2017-04-04 NOTE — Progress Notes (Signed)
Colonoscopy showed large amount of fresh and old blood along with clots. After thorough lavage, there was no evidence of active bleeding noted. Diverticulosis was noted in sigmoid and descending colon without associated active bleeding. It seems that the source of bleeding is likely from the gastric varices. Spoke with Dr. Anselm Pancoast from IR and discussed performing BRTO for obliteration of gastric varices. Will keep NPO for now.

## 2017-04-04 NOTE — Progress Notes (Signed)
Patient ID: John Warren, male   DOB: 10-30-1951, 66 y.o.   MRN: [id number] Patient known to IR.  66 yo with persistent GI bleeding and active bleeding from gastric varices based on EGD.  Patient is currently intubated and requiring pressors for BP support.  Discussed with Dr. Paulita Fujita and patient needs immediate treatment of the bleeding varices.  Reviewed patient's recent CT, he has large gastric varices and a gastrorenal shunt.  MELD score was around 9 a few days ago. Based on patient's severe active bleeding, he would benefit from an emergent TIPS for portal venous decompression and probably a BRTO for direct treatment of the gastric varices.  Procedure was discussed with patient's sister over the phone, risks including bleeding, post procedure encephalopathy and death were discussed.  Consent obtained from sister.  Plan for procedures today.

## 2017-04-04 NOTE — Anesthesia Procedure Notes (Signed)
Procedure Name: MAC Date/Time: 04/04/2017 8:25 AM Performed by: Carney Living Pre-anesthesia Checklist: Patient identified, Emergency Drugs available, Suction available, Patient being monitored and Timeout performed Patient Re-evaluated:Patient Re-evaluated prior to inductionOxygen Delivery Method: Simple face mask

## 2017-04-04 NOTE — Procedures (Signed)
1. Transjuglar IntrhepaticPortosystemic Shunt (TIPS) creation 2. Balloon-occluded AntegradeTransvenous Obliteration (BATO)of gastric varices  Operators: Henn/Aubree Doody/Watts No complication No blood loss. See complete dictation in [facility].

## 2017-04-04 NOTE — Anesthesia Preprocedure Evaluation (Addendum)
Anesthesia Evaluation  Patient identified by MRN, date of birth, ID band Patient unresponsive    Reviewed: Allergy & Precautions, NPO status , Patient's Chart, lab work & pertinent test results, reviewed documented beta blocker date and time , Unable to perform ROS - Chart review onlyPreop documentation limited or incomplete due to emergent nature of procedure.  History of Anesthesia Complications Negative for: history of anesthetic complications  Airway Mallampati: Intubated       Dental  (+) Poor Dentition   Pulmonary COPD, former smoker,  Pt intubated, sedated pulm edema, pleural effusion       rales    Cardiovascular hypertension,  Rhythm:Regular Rate:Tachycardia  Hypotensive: transfusing '17 ECHO: EF 65-60%, mild AS   Neuro/Psych    GI/Hepatic GERD  Medicated,(+) Cirrhosis   Esophageal Varices  substance abuse  alcohol use, GI bleed: resuscitated, transfused, intubated in ENDO this am   Endo/Other  negative endocrine ROS  Renal/GU negative Renal ROS     Musculoskeletal   Abdominal   Peds  Hematology  (+) Blood dyscrasia (Hb 11.5, plt 118k), anemia ,   Anesthesia Other Findings   Reproductive/Obstetrics                            Anesthesia Physical Anesthesia Plan  ASA: IV and emergent  Anesthesia Plan: General   Post-op Pain Management:    Induction: Inhalational  Airway Management Planned: Oral ETT  Additional Equipment: Arterial line  Intra-op Plan:   Post-operative Plan: Post-operative intubation/ventilation  Informed Consent:   Only emergency history available and History available from chart only  Plan Discussed with: CRNA and Surgeon  Anesthesia Plan Comments: (Plan routine monitors, GETA with existing ETT, transfusion, post op ventilation)        Anesthesia Quick Evaluation

## 2017-04-04 NOTE — Op Note (Addendum)
[facility] Patient Name: John Warren Procedure Date : 04/04/2017 MRN: [id number] Attending MD: Ronnette Juniper , MD Date of Birth: 26-Mar-1951 CSN: [id number] Age: 66 Admit Type: Inpatient Procedure:                Colonoscopy Indications:              Rectal bleeding, EGD showed gastric varices and                            video capsule endoscopy showed a small non bleeding                            AVM and non specific erosion Providers:                Cleda Daub, RN, Tinnie Gens, Technician, Cletis Athens, Technician, Ronnette Juniper, MD Referring MD:              Medicines:                Monitored Anesthesia Care Complications:            No immediate complications. Estimated Blood Loss:     Estimated blood loss: none. Procedure:                Pre-Anesthesia Assessment:                           - Prior to the procedure, a History and Physical                            was performed, and patient medications and                            allergies were reviewed. The patient's tolerance of                            previous anesthesia was also reviewed. The risks                            and benefits of the procedure and the sedation                            options and risks were discussed with the patient.                            All questions were answered, and informed consent                            was obtained. Prior Anticoagulants: The patient has                            taken no previous anticoagulant or antiplatelet  agents. ASA Grade Assessment: III - A patient with                            severe systemic disease. After reviewing the risks                            and benefits, the patient was deemed in                            satisfactory condition to undergo the procedure.                           After obtaining informed consent, the colonoscope                            was passed  under direct vision. Throughout the                            procedure, the patient's blood pressure, pulse, and                            oxygen saturations were monitored continuously. The                            [id number] ([id number]) scope was introduced through                            the anus and advanced to the the cecum, identified                            by appendiceal orifice and ileocecal valve. The                            colonoscopy was performed without difficulty. The                            patient tolerated the procedure well. The quality                            of the bowel preparation was fair. The ileocecal                            valve, appendiceal orifice, and rectum were                            photographed. Scope In: 8:31:30 AM Scope Out: 9:09:13 AM Scope Withdrawal Time: 0 hours 22 minutes 30 seconds  Total Procedure Duration: 0 hours 37 minutes 43 seconds  Findings:      The digital rectal exam findings include blood stained gloved finger.       Pertinent negatives include normal sphincter tone.      Large amount of clots as well as fresh and old blood was found in the       entire colon.      Multiple  small and large-mouthed diverticula were found in the sigmoid       colon and descending colon. After thorough lavage, there was no evidence       of bleeding from colonic mucosa.      Non-bleeding internal hemorrhoids were found during retroflexion. The       hemorrhoids were mild and small. Impression:               - Preparation of the colon was fair.                           - Blood stained gloved finger found on digital                            rectal exam.                           - Blood in the entire examined colon.                           - Diverticulosis in the sigmoid colon and in the                            descending colon, without active bleeding.                           - Non-bleeding internal hemorrhoids.                            - No specimens collected. Moderate Sedation:      Moderate (conscious) sedation was personally administered by an       anesthesia professional. The following parameters were monitored: oxygen       saturation, heart rate, blood pressure, respiratory rate, EKG, adequacy       of pulmonary ventilation, and response to care. [Procedure Duration       Time]. Recommendation:           - Refer to an interventional radiologist today.                            Spoke with Dr.Henn for possible BRTO for gastric                            varices noted on EGD(likely cause of GI Bleeding).                           - NPO.                           - Monitor H and H and transfuse blood if Hb is less                            than 7 gm/dl.                           - Continue present medications.                           -  No repeat colonoscopy. Procedure Code(s):        --- Professional ---                           [phone], Colonoscopy, flexible; diagnostic, including                            collection of specimen(s) by brushing or washing,                            when performed (separate procedure) Diagnosis Code(s):        --- Professional ---                           K64.8, Other hemorrhoids                           K92.2, Gastrointestinal hemorrhage, unspecified                           K62.5, Hemorrhage of anus and rectum                           K57.30, Diverticulosis of large intestine without                            perforation or abscess without bleeding CPT copyright 2016 American Medical Association. All rights reserved. The codes documented in this report are preliminary and upon coder review may  be revised to meet current compliance requirements. Ronnette Juniper, MD 04/04/2017 9:35:59 AM This report has been signed electronically. Number of Addenda: 0

## 2017-04-04 NOTE — Anesthesia Procedure Notes (Signed)
Central Venous Catheter Insertion Performed by: Marcie Bal Gabrielle Mester, anesthesiologist Start/End5/01/2017 10:16 AM, 04/04/2017 10:23 AM Patient location: Pre-op. Preanesthetic checklist: patient identified, IV checked, site marked, risks and benefits discussed, surgical consent, monitors and equipment checked, pre-op evaluation, timeout performed and anesthesia consent Lidocaine 1% used for infiltration and patient sedated Hand hygiene performed  and maximum sterile barriers used  Catheter size: 7 Fr Total catheter length 16. Central line was placed.Triple lumen Procedure performed using ultrasound guided technique. Ultrasound Notes:image(s) printed for medical record Attempts: 1 Following insertion, dressing applied and line sutured. Post procedure assessment: blood return through all ports  Patient tolerated the procedure well with no immediate complications.

## 2017-04-05 ENCOUNTER — Inpatient Hospital Stay (HOSPITAL_COMMUNITY): Payer: Medicare Other

## 2017-04-05 ENCOUNTER — Other Ambulatory Visit (HOSPITAL_COMMUNITY): Payer: Medicare Other

## 2017-04-05 ENCOUNTER — Encounter (HOSPITAL_COMMUNITY): Payer: Self-pay | Admitting: Diagnostic Radiology

## 2017-04-05 DIAGNOSIS — K7031 Alcoholic cirrhosis of liver with ascites: Principal | ICD-10-CM

## 2017-04-05 DIAGNOSIS — J810 Acute pulmonary edema: Secondary | ICD-10-CM

## 2017-04-05 DIAGNOSIS — J9601 Acute respiratory failure with hypoxia: Secondary | ICD-10-CM

## 2017-04-05 DIAGNOSIS — I360 Nonrheumatic tricuspid (valve) stenosis: Secondary | ICD-10-CM

## 2017-04-05 DIAGNOSIS — K7460 Unspecified cirrhosis of liver: Secondary | ICD-10-CM | POA: Diagnosis present

## 2017-04-05 DIAGNOSIS — R188 Other ascites: Secondary | ICD-10-CM | POA: Diagnosis present

## 2017-04-05 DIAGNOSIS — J9602 Acute respiratory failure with hypercapnia: Secondary | ICD-10-CM

## 2017-04-05 LAB — POCT I-STAT 7, (LYTES, BLD GAS, ICA,H+H)
Acid-base deficit: 7 mmol/L — ABNORMAL HIGH (ref 0.0–2.0)
Acid-base deficit: 5 mmol/L — ABNORMAL HIGH (ref 0.0–2.0)
Bicarbonate: 22.5 mmol/L (ref 20.0–28.0)
Bicarbonate: 23.1 mmol/L (ref 20.0–28.0)
Calcium, Ion: 0.91 mmol/L — ABNORMAL LOW (ref 1.15–1.40)
Calcium, Ion: 0.97 mmol/L — ABNORMAL LOW (ref 1.15–1.40)
HCT: 36 % — ABNORMAL LOW (ref 39.0–52.0)
HEMATOCRIT: 33 % — AB (ref 39.0–52.0)
HEMOGLOBIN: 12.2 g/dL — AB (ref 13.0–17.0)
Hemoglobin: 11.2 g/dL — ABNORMAL LOW (ref 13.0–17.0)
O2 SAT: 72 %
O2 SAT: 71 %
PCO2 ART: 57 mmHg — AB (ref 32.0–48.0)
PO2 ART: 41 mmHg — AB (ref 83.0–108.0)
POTASSIUM: 3.7 mmol/L (ref 3.5–5.1)
Patient temperature: 34.5
Potassium: 3.8 mmol/L (ref 3.5–5.1)
Sodium: 139 mmol/L (ref 135–145)
Sodium: 140 mmol/L (ref 135–145)
TCO2: 24 mmol/L (ref 0–100)
TCO2: 25 mmol/L (ref 0–100)
pCO2 arterial: 49.5 mmHg — ABNORMAL HIGH (ref 32.0–48.0)
pH, Arterial: 7.19 — CL (ref 7.350–7.450)
pH, Arterial: 7.263 — ABNORMAL LOW (ref 7.350–7.450)
pO2, Arterial: 38 mmHg — CL (ref 83.0–108.0)

## 2017-04-05 LAB — MAGNESIUM: MAGNESIUM: 1.5 mg/dL — AB (ref 1.7–2.4)

## 2017-04-05 LAB — BPAM FFP
BLOOD PRODUCT EXPIRATION DATE: 201805062359
Blood Product Expiration Date: 201805062359
Blood Product Expiration Date: 201805062359
Blood Product Expiration Date: 201805062359
ISSUE DATE / TIME: 201805031431
ISSUE DATE / TIME: 201805031431
ISSUE DATE / TIME: 201805031605
ISSUE DATE / TIME: 201805031605
UNIT TYPE AND RH: 600
Unit Type and Rh: 6200
Unit Type and Rh: 6200
Unit Type and Rh: 6200

## 2017-04-05 LAB — PREPARE FRESH FROZEN PLASMA
UNIT DIVISION: 0
Unit division: 0
Unit division: 0
Unit division: 0

## 2017-04-05 LAB — HEMOGLOBIN AND HEMATOCRIT, BLOOD
HCT: 35.6 % — ABNORMAL LOW (ref 39.0–52.0)
HEMATOCRIT: 35.2 % — AB (ref 39.0–52.0)
HEMOGLOBIN: 12.1 g/dL — AB (ref 13.0–17.0)
Hemoglobin: 11.7 g/dL — ABNORMAL LOW (ref 13.0–17.0)

## 2017-04-05 LAB — GLUCOSE, CAPILLARY
GLUCOSE-CAPILLARY: 151 mg/dL — AB (ref 65–99)
GLUCOSE-CAPILLARY: 123 mg/dL — AB (ref 65–99)
Glucose-Capillary: 132 mg/dL — ABNORMAL HIGH (ref 65–99)

## 2017-04-05 LAB — BPAM PLATELET PHERESIS
BLOOD PRODUCT EXPIRATION DATE: 201805032359
BLOOD PRODUCT EXPIRATION DATE: 201805042359
ISSUE DATE / TIME: 201805031249
ISSUE DATE / TIME: 201805031249
UNIT TYPE AND RH: 6200
Unit Type and Rh: 6200

## 2017-04-05 LAB — POCT I-STAT 3, ART BLOOD GAS (G3+)
ACID-BASE DEFICIT: 2 mmol/L (ref 0.0–2.0)
BICARBONATE: 23.5 mmol/L (ref 20.0–28.0)
O2 SAT: 94 %
TCO2: 25 mmol/L (ref 0–100)
pCO2 arterial: 45.8 mmHg (ref 32.0–48.0)
pH, Arterial: 7.322 — ABNORMAL LOW (ref 7.350–7.450)
pO2, Arterial: 80 mmHg — ABNORMAL LOW (ref 83.0–108.0)

## 2017-04-05 LAB — PREPARE PLATELET PHERESIS
UNIT DIVISION: 0
Unit division: 0

## 2017-04-05 LAB — AMMONIA: Ammonia: 80 umol/L — ABNORMAL HIGH (ref 9–35)

## 2017-04-05 LAB — CBC
HCT: 35.8 % — ABNORMAL LOW (ref 39.0–52.0)
HCT: 36.4 % — ABNORMAL LOW (ref 39.0–52.0)
HEMOGLOBIN: 12 g/dL — AB (ref 13.0–17.0)
Hemoglobin: 11.7 g/dL — ABNORMAL LOW (ref 13.0–17.0)
MCH: 28.8 pg (ref 26.0–34.0)
MCH: 29.1 pg (ref 26.0–34.0)
MCHC: 33 g/dL (ref 30.0–36.0)
MCHC: 32.7 g/dL (ref 30.0–36.0)
MCV: 88.1 fL (ref 78.0–100.0)
MCV: 88.2 fL (ref 78.0–100.0)
PLATELETS: 186 10*3/uL (ref 150–400)
PLATELETS: 171 10*3/uL (ref 150–400)
RBC: 4.06 MIL/uL — ABNORMAL LOW (ref 4.22–5.81)
RBC: 4.13 MIL/uL — AB (ref 4.22–5.81)
RDW: 17.3 % — AB (ref 11.5–15.5)
RDW: 16.7 % — AB (ref 11.5–15.5)
WBC: 26.4 10*3/uL — AB (ref 4.0–10.5)
WBC: 24.2 10*3/uL — AB (ref 4.0–10.5)

## 2017-04-05 LAB — ECHOCARDIOGRAM COMPLETE
HEIGHTINCHES: 71 in
WEIGHTICAEL: 3640.24 [oz_av]

## 2017-04-05 LAB — LACTIC ACID, PLASMA
LACTIC ACID, VENOUS: 2.9 mmol/L — AB (ref 0.5–1.9)
Lactic Acid, Venous: 3.1 mmol/L (ref 0.5–1.9)

## 2017-04-05 LAB — PHOSPHORUS
Phosphorus: 5.5 mg/dL — ABNORMAL HIGH (ref 2.5–4.6)
Phosphorus: 4.3 mg/dL (ref 2.5–4.6)

## 2017-04-05 LAB — PROCALCITONIN: PROCALCITONIN: 1.31 ng/mL

## 2017-04-05 LAB — BASIC METABOLIC PANEL
ANION GAP: 8 (ref 5–15)
Anion gap: 11 (ref 5–15)
BUN: 11 mg/dL (ref 6–20)
BUN: 11 mg/dL (ref 6–20)
CALCIUM: 7.2 mg/dL — AB (ref 8.9–10.3)
CHLORIDE: 103 mmol/L (ref 101–111)
CO2: 23 mmol/L (ref 22–32)
CO2: 25 mmol/L (ref 22–32)
CREATININE: 1.16 mg/dL (ref 0.61–1.24)
Calcium: 7.3 mg/dL — ABNORMAL LOW (ref 8.9–10.3)
Chloride: 103 mmol/L (ref 101–111)
Creatinine, Ser: 1.08 mg/dL (ref 0.61–1.24)
GFR calc Af Amer: >60 mL/min (ref >60)
GFR calc non Af Amer: >60 mL/min (ref >60)
GLUCOSE: 135 mg/dL — AB (ref 65–99)
Glucose, Bld: 156 mg/dL — ABNORMAL HIGH (ref 65–99)
POTASSIUM: 4.3 mmol/L (ref 3.5–5.1)
Potassium: 4.3 mmol/L (ref 3.5–5.1)
SODIUM: 137 mmol/L (ref 135–145)
Sodium: 136 mmol/L (ref 135–145)

## 2017-04-05 LAB — TROPONIN I
TROPONIN I: 15.43 ng/mL — AB (ref <0.03)
Troponin I: 33.25 ng/mL (ref <0.03)

## 2017-04-05 LAB — CORTISOL: CORTISOL PLASMA: 22.6 ug/dL

## 2017-04-05 MED ORDER — NOREPINEPHRINE BITARTRATE 1 MG/ML IV SOLN
0.0000 ug/min | INTRAVENOUS | Status: DC
  Administered 2017-04-05: 34 ug/min via INTRAVENOUS
  Administered 2017-04-05: 15 ug/min via INTRAVENOUS
  Administered 2017-04-06: 20 ug/min via INTRAVENOUS
  Filled 2017-04-05 (×4): qty 16

## 2017-04-05 MED ORDER — VANCOMYCIN HCL 1000 MG IV SOLR
1000.0000 mg | Freq: Once | INTRAVENOUS | Status: AC
  Administered 2017-04-05: 1000 mg via INTRAVENOUS
  Filled 2017-04-05: qty 1000

## 2017-04-05 MED ORDER — CHLORHEXIDINE GLUCONATE CLOTH 2 % EX PADS
6.0000 | MEDICATED_PAD | Freq: Every day | CUTANEOUS | Status: DC
  Administered 2017-04-05 – 2017-04-13 (×9): 6 via TOPICAL

## 2017-04-05 MED ORDER — DEXTROSE 5 % IV SOLN
1.0000 g | Freq: Three times a day (TID) | INTRAVENOUS | Status: DC
  Administered 2017-04-05 – 2017-04-12 (×22): 1 g via INTRAVENOUS
  Filled 2017-04-05 (×23): qty 1

## 2017-04-05 MED ORDER — METRONIDAZOLE IN NACL 5-0.79 MG/ML-% IV SOLN
500.0000 mg | Freq: Three times a day (TID) | INTRAVENOUS | Status: DC
Start: 2017-04-05 — End: 2017-04-12
  Administered 2017-04-05 – 2017-04-12 (×21): 500 mg via INTRAVENOUS
  Filled 2017-04-05 (×22): qty 100

## 2017-04-05 MED ORDER — LACTULOSE ENEMA
300.0000 mL | Freq: Two times a day (BID) | ORAL | Status: DC
  Administered 2017-04-05 – 2017-04-08 (×7): 300 mL via RECTAL
  Filled 2017-04-05 (×8): qty 300

## 2017-04-05 MED ORDER — FUROSEMIDE 10 MG/ML IJ SOLN
40.0000 mg | Freq: Two times a day (BID) | INTRAMUSCULAR | Status: DC
Start: 2017-04-05 — End: 2017-04-06
  Administered 2017-04-05 (×2): 40 mg via INTRAVENOUS
  Filled 2017-04-05 (×3): qty 4

## 2017-04-05 MED ORDER — MAGNESIUM SULFATE 2 GM/50ML IV SOLN
2.0000 g | Freq: Once | INTRAVENOUS | Status: AC
Start: 2017-04-05 — End: 2017-04-05
  Administered 2017-04-05: 2 g via INTRAVENOUS
  Filled 2017-04-05: qty 50

## 2017-04-05 MED ORDER — SODIUM CHLORIDE 0.9 % IV SOLN
0.0000 ug/min | INTRAVENOUS | Status: DC
  Administered 2017-04-05 (×2): 300 ug/min via INTRAVENOUS
  Administered 2017-04-05: 400 ug/min via INTRAVENOUS
  Administered 2017-04-05 – 2017-04-06 (×6): 300 ug/min via INTRAVENOUS
  Administered 2017-04-07: 200 ug/min via INTRAVENOUS
  Administered 2017-04-07: 150 ug/min via INTRAVENOUS
  Administered 2017-04-08: 175 ug/min via INTRAVENOUS
  Administered 2017-04-09: 150 ug/min via INTRAVENOUS
  Administered 2017-04-10: 130 ug/min via INTRAVENOUS
  Administered 2017-04-10: 120 ug/min via INTRAVENOUS
  Filled 2017-04-05 (×21): qty 8

## 2017-04-05 MED ORDER — SODIUM CHLORIDE 0.9% FLUSH
10.0000 mL | Freq: Two times a day (BID) | INTRAVENOUS | Status: DC
  Administered 2017-04-05 – 2017-04-09 (×6): 10 mL
  Administered 2017-04-10: 20 mL
  Administered 2017-04-10 – 2017-04-12 (×4): 10 mL

## 2017-04-05 MED ORDER — PERFLUTREN LIPID MICROSPHERE
1.0000 mL | INTRAVENOUS | Status: AC | PRN
  Administered 2017-04-05: 2 mL via INTRAVENOUS
  Filled 2017-04-05: qty 10

## 2017-04-05 MED ORDER — VANCOMYCIN HCL 1000 MG IV SOLR
1000.0000 mg | Freq: Two times a day (BID) | INTRAVENOUS | Status: DC
  Administered 2017-04-05 – 2017-04-09 (×8): 1000 mg via INTRAVENOUS
  Filled 2017-04-05 (×9): qty 1000

## 2017-04-05 MED ORDER — POTASSIUM CHLORIDE 10 MEQ/50ML IV SOLN
10.0000 meq | INTRAVENOUS | Status: AC
  Administered 2017-04-05 (×2): 10 meq via INTRAVENOUS
  Filled 2017-04-05 (×2): qty 50

## 2017-04-05 MED ORDER — SODIUM CHLORIDE 0.9 % IV SOLN
INTRAVENOUS | Status: DC
  Administered 2017-04-06: 04:00:00 via INTRAVENOUS

## 2017-04-05 MED ORDER — BUDESONIDE 0.5 MG/2ML IN SUSP
0.5000 mg | Freq: Two times a day (BID) | RESPIRATORY_TRACT | Status: DC
  Administered 2017-04-05 – 2017-04-16 (×22): 0.5 mg via RESPIRATORY_TRACT
  Filled 2017-04-05 (×22): qty 2

## 2017-04-05 MED ORDER — VANCOMYCIN HCL 1000 MG IV SOLR: 1000.0000 mg | Freq: Two times a day (BID) | INTRAVENOUS | Status: DC

## 2017-04-05 MED ORDER — SODIUM CHLORIDE 0.9% FLUSH
10.0000 mL | INTRAVENOUS | Status: DC | PRN
  Administered 2017-04-09: 10 mL
  Filled 2017-04-05: qty 40

## 2017-04-05 NOTE — Progress Notes (Signed)
Initial Nutrition Assessment  DOCUMENTATION CODES:   Not applicable  INTERVENTION:    Once GI bleeding resolved, recommend start TF via NG/Cortrak tube if unable to extubate.  Vital AF 1.2 at 80 ml/h (1920 ml per day) would provide 2304 kcal, 144 gm protein, 1557 ml free water daily.  NUTRITION DIAGNOSIS:   Inadequate oral intake related to inability to eat as evidenced by NPO status.  GOAL:   Patient will meet greater than or equal to 90% of their needs  MONITOR:   Vent status, Labs, I & O's  REASON FOR ASSESSMENT:   Ventilator    ASSESSMENT:    66 y.o. male with PMH including but not limited to EtOH cirrhosis, portal gastropathy, and prior GI bleeds (3 admissions since Jan 2017, previously due to AVM and sigmoid diverticulosis). He was admitted 03/30/17 with recurrent GI bleed that started the day prior. S/P colonoscopy on 4/30. Developed respiratory failure and required intubation on 5/3.   Discussed patient in ICU rounds and with RN today. S/P capsule endoscopy 5/2. S/P endoscopy 5/3, found grade II esophageal varices, type 1 isolated gastric varices spurting blood, and a single non-bleeding angioectasia in stomach No enteral access at this time. No plans to start nutrition support today.  Spoke with patient's significant other who reports that patient had a good appetite and was eating well PTA. Weight has been stable.  Patient is currently intubated on ventilator support MV: 14.1 L/min Temp (24hrs), Avg:98.5 F (36.9 C), Min:95.2 F (35.1 C), Max:100.4 F (38 C)  Labs reviewed: magnesium 1.5 (L) Medications reviewed and include Octreotide, Lasix, Lactulose, Thiamine, KCl.  Diet Order:  Diet NPO time specified  Skin:  Reviewed, no issues  Last BM:  5/3  Height:   Ht Readings from Last 1 Encounters:  04/04/17 5\' 11"  (1.803 m)    Weight:   Wt Readings from Last 1 Encounters:  04/05/17 227 lb 8.2 oz (103.2 kg)   Admission weight: 204 lb (92.5  kg)  Ideal Body Weight:  78.2 kg  BMI:  28.5 (using admission weight)  Estimated Nutritional Needs:   Kcal:  2225  Protein:  130-150 gm  Fluid:  2.2-2.4 L  EDUCATION NEEDS:   No education needs identified at this time  Molli Barrows, [place], [place], [employer] Pager [phone] After Hours Pager [phone]

## 2017-04-05 NOTE — Progress Notes (Signed)
  Echocardiogram 2D Echocardiogram with Definity has been performed.  John Warren 04/05/2017, 4:22 PM

## 2017-04-05 NOTE — Progress Notes (Signed)
CRITICAL VALUE ALERT  Critical value received:  Lactic acid 2.9, Troponin 15.43  Date of notification:  04/05/2017  Time of notification:  01:05  Critical value read back:Yes.    Nurse who received alert:  Woodroe Chen, RN  MD notified (1st page):  Elsworth Soho  Time of first page:  01:13  MD notified (2nd page):  Time of second page:  Responding MD:  Elsworth Soho  Time MD responded:  01:15

## 2017-04-05 NOTE — Progress Notes (Signed)
PULMONARY / CRITICAL CARE MEDICINE   Name: John Warren MRN: [id number] DOB: 1951/06/10    ADMISSION DATE:  03/29/2017 CONSULTATION DATE:  04/04/17  REFERRING MD:  Evette Doffing  CHIEF COMPLAINT:  GI Bleed  HISTORY OF PRESENT ILLNESS: Pt is encephelopathic; therefore, this HPI is obtained from chart review. John Warren is a 66 y.o. male with PMH as outlined below including but not limited to EtOH cirrhosis, portal gastropathy, and prior GI bleeds (3 admissions since Jan 2017, previously due to AVM and sigmoid diverticulosis). He was admitted 03/30/17 with recurrent GI bleed that started the day prior.  He was in his usual state of health then had a dark tarry BM followed by BRBPR a few hours later.  He had not been on any NSAID's, antiplatelets, anticoagulants.  He supposedly had quit drinking 1 year ago.  He was admitted and was given PRBC's and FFP and also started on PPI and octreotide.  He had tagged bleeding scan that demonstrated active bleed but it was difficult to localize the source of bleeding. 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  Based on CT findings as well as EGD and colonoscopy (see below), BRTO was recommended and planned for 04/05/17.  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  Capsule endoscopy performed 04/03/17 and demonstrated small non-bleeding AVM in mid small bowel and one non-specific erosion in the small bowel, no evidence of blood in the small bowel or colon (though poor visualization).  Colonoscopy was performed 04/01/17 which demonstrated large amount of fresh and old blood along with clots in the entire colon along with diverticulosis in the sigmoid and descending colon without active bleeding, non bleeding  internal hemorrhoids.  Source of bleeding felt to be gastric varices.  Following colonoscopy (which was performed under MAC anesthesia), pt had worsening hypotension with SBP as low as 60's.  Anesthesia administered 3 bags of albumin (12.5g) and started phenylephrine infusion.  He was also given 3u PRBC's.  CVL and A-line were placed and PCCM was called for further management.  BP responded well to albumin, neo, PRBC's (SBP currently 130's).  Neo was dropped to 16mg and pt was transferred to ICU.  After transfer to ICU, he decompensated further and required intubation due to respiratory insufficiency.    SUBJECTIVE:   He had a busy day on 5/3 with transfusion and IR 2/2 massive GIBleed/hemorrhagic shock.  He got 6 u pRBC, 4 u FFP, 3 u plts last 24 hrs.  Diuresced as well. Hb has been stable.  (-) bloody stools last 12 hrs.  Need more pressors overnight. Neo at 300, Levophed at 34 ug/kg/min.  Fevers last 24 hrs.  Fio2 90% and PEEP 14.    VITAL SIGNS: BP 111/70 (BP Location: Left Arm)   Pulse (!) 120   Temp 100 F (37.8 C) (Core (Comment))   Resp 19   Ht _0  (1.803 m)   Wt 103.2 kg (227 lb 8.2 oz)   SpO2 100%   BMI 31.73 kg/m   HEMODYNAMICS:    VENTILATOR SETTINGS: Vent Mode: PCV FiO2 (%):  [90 %-100 %] 90 % Set Rate:  [20 bmp-32 bmp] 24 bmp Vt Set:  [600 mL] 600 mL PEEP:  [10 cmH20-18 cmH20] 14 cmH20 Plateau Pressure:  [22 cmH20-28 cmH20] 24 cmH20  INTAKE / OUTPUT: I/O last 3 completed shifts: In: 10946.6 [P.O.:1560; I.V.:4610.2; Blood:3506.4; Other:20; IV KPQAESLPN:3005] Out: 5275 [RTMYT:1173]   PHYSICAL EXAMINATION: General: Chronically ill appearing male, comfortable.  Neuro: [place]/AT. Sedated. HEENT: [place]/AT.  MMM. Cardiovascular: RRR, no M/R/G. Lungs: Coarse crackles throughout. Abdomen: BS x 4, S/NT/ND. Musculoskeletal: No gross deformities.  2+ edema. L > R (chronic) Skin: Warm, dry.  LABS:  BMET  Recent Labs Lab 04/04/17 0300 04/04/17 1011  04/04/17 1918 04/05/17 0430  NA 135 135 137 137  K 4.8 4.2 3.8 4.3  CL 103 103 102 103  CO2 22  --  25 23  BUN _0 CREATININE 0.94 0.80 0.89 1.16  GLUCOSE 136* 128* 192* 135*    Electrolytes  Recent Labs Lab 04/04/17 0300 04/04/17 1918 04/04/17 2348 04/05/17 0430  CALCIUM 7.9* 7.2*  --  7.2*  MG 1.8  --   --  1.5*  PHOS  --   --  5.5* 4.3    CBC  Recent Labs Lab 04/04/17 0300  04/04/17 1530 04/04/17 1918 04/04/17 2348 04/05/17 0430  WBC 12.2*  --   --   --  24.2* 26.4*  HGB 8.3*  < >  --  11.7* 11.7* 12.0*  HCT 24.7*  < >  --  35.3* 35.8* 36.4*  PLT 118*  --  181  --  171 186  < > = values in this interval not displayed.  Coag's  Recent Labs Lab 03/29/17 1855 03/31/17 0352 04/04/17 1530  INR 1.34 1.32 1.49    Sepsis Markers  Recent Labs Lab 03/29/17 2214 04/04/17 2358  LATICACIDVEN 1.91* 2.9*    ABG  Recent Labs Lab 04/04/17 1204 04/05/17 0002 04/05/17 0440  PHART 7.242* 7.310* 7.322*  PCO2ART 47.0 49.9* 45.8  PO2ART 182.0* 85.0 80.0*    Liver Enzymes  Recent Labs Lab 03/30/17 0228 04/01/17 0301 04/04/17 1918  AST 33 26 66*  ALT _1 ALKPHOS 70 52 63  BILITOT 1.1 0.7 3.3*  ALBUMIN 3.0* 2.3* 3.5    Cardiac Enzymes  Recent Labs Lab 03/31/17 0812 04/04/17 1918 04/04/17 2348  TROPONINI 0.05* 3.85* 15.43*    Glucose  Recent Labs Lab 03/29/17 2102 04/04/17 1209 04/04/17 1928 04/05/17 0721  GLUCAP 134* 164* 170* 132*    Imaging Dg Chest Port 1 View  Result Date: 04/05/2017 CLINICAL DATA:  Respiratory failure. EXAM: PORTABLE CHEST 1 VIEW COMPARISON:  04/04/2017.  03/29/2017. FINDINGS: Endotracheal tube and right IJ line in stable position. Heart size normal. Diffuse severe bilateral pulmonary interstitial prominence consistent with interstitial edema and/or pneumonitis again noted without interim change. Small left pleural effusion. Biapical pleural thickening noted consistent scarring. Surgical coils noted  in the upper abdomen. IMPRESSION: 1.  Lines and tubes in stable position. 2. Diffuse severe bilateral from interstitial prominence again noted consistent with interstitial edema and/or pneumonitis. No interim change. Small left pleural effusion. Electronically Signed   By: Marcello Moores  Register   On: 04/05/2017 07:01   Dg Chest Port 1 View  Result Date: 04/04/2017 CLINICAL DATA:  Status post endotracheal intubation. EXAM: PORTABLE CHEST 1 VIEW COMPARISON:  Radiographs of Apr 02, 2017. FINDINGS: The heart size and mediastinal contours are within normal limits. Endotracheal tube is seen projected over tracheal air shadow with distal tip 7.5 cm above the carina. Right internal jugular catheter is noted with distal tip in expected position of the SVC. No pneumothorax is noted. There is interval development of diffuse reticulonodular opacities throughout both  lungs most consistent with inflammation or pneumonia or possibly edema. Mild right pleural effusion is noted. The visualized skeletal structures are unremarkable. IMPRESSION: Endotracheal tube and right internal jugular venous catheter in grossly good position. Interval development of bilateral diffuse lung opacities as described above, most consistent with inflammation or pneumonia, or less likely edema. Mild right pleural effusion is noted. Electronically Signed   By: Marijo Conception, M.D.   On: 04/04/2017 11:36     STUDIES:  EGD 4/28 >  type 1 isolated gastric varices without bleeding, single non-bleeding angiodysplastic lesion in the stomach, normal duodenum. Capsule endoscopy 5/2 > small non-bleeding AVM in mid small bowel and one non-specific erosion in the small bowel, no evidence of blood in the small bowel or colon (though poor visualization). Colonoscopy 4/30 > large amount of fresh and old blood along with clots in the entire colon along with diverticulosis in the sigmoid and descending colon without active bleeding, non bleeding internal hemorrhoids.   Source of bleeding felt to be gastric varices.  CULTURES: Blood 4/28 > neg MRSA (-)  ANTIBIOTICS: Vanc 5/4 >  Cefepime 5/4 >  Flagyl 5/4 >    SIGNIFICANT EVENTS: 4/28 > admit 5/3 > decompensation following colonoscopy > transferred to ICU > intubated. Shock.   LINES/TUBES: ETT 5/3 >  R IJ CVL 5/3 >  R radial a line 5/3 >   DISCUSSION: 65 y.o. male with hx EtOH cirrhosis and portal HTN as well as previous GI bleed (3 admissions since Jan 2017).  Admitted 4/28 with GI bleed.  EGD showed gastric varices without bleeding, capsule endo showed small non bleeding AVM in small bowel, colonoscopy showed large amount of fresh and old blood throughout entire colon along with diverticulosis. After colonoscopy, he had sudden drop in BP; therefore, received aggressive IVF's and PRBC's.  He was then transferred to ICU where he required intubation for respiratory insufficiency. Required massive transfusion and pressors.    ASSESSMENT / PLAN:  PULMONARY A: Acute Hypoxemic resp failure 2/2 acute pulm edema with volume resuscitation with GI bleed + demand ischemia + TRALI +/- HCAP  COPD without evidence of exacerbation. Tobacco dependence. P:   Full vent support. Wean as able. VAP prevention measures. Needs diuresis (lasix 40 BID + KCl) Start abx for possible HCAP. Cont pressors.  Steroids for shock but may have worsened GI bleeding. Will check a cortisol level randomly now.  If it is low, will start HC.    CARDIOVASCULAR A:  Hemorrhagic shock - due to bleeding gastric and esophageal varices.  Demand ischemia Pulmonary edema Hx SVT, HTN, dCHF (Echo from Jan 2017 with EF 65-70%, G2DD). P:  Continue pressors Needs diuresis Troponin now >> if higher, will consider cardiogenic shock as contributing to shock f/u on echo   RENAL A:   Azotemia Pulm edema P:   Diuresce   GASTROINTESTINAL A:   Hemorrhagic shock  Due to bleeding gastric and esophageal varices.  S/P TIPS and BRTO  on 5/3 Hx EtOH cirrhosis, portal HTN / gastropathy, hepatic encephalopathy. P:   Hb and Hct have been stable last 12 hrs.  No further transfusion last 12 hrs.  Checking Hb and Hct q6 still 2/2 shock/being on pressors.  Cont octreotide drip Cont Pantoprazole BID NPO for now.  Need to ask GI if we can insert Dubhoff for feeding over the weekend.  Continue preadmission lactulose, rifaximin if we can insert Dubhoff.     HEMATOLOGIC A:   Hemorrhagic shock Thrombocytopenia. P:  Transfuse for Hgb < 7. Checking Hb and Hct q 6 for next 24 hrs.  SCD's only.   INFECTIOUS A:   Concern for HCAP, possible abd infection.  P:   Start cefepime + Vanc + flagyl panculture Check PCT   ENDOCRINE A:   No acute issues.   P:   No interventions required.   NEUROLOGIC A:   Acute encephalopathy, resolved.  Hx EtOH abuse (reportedly stopped 1 year ago). P:   Sedation: Fentanyl gtt / Midazolam PRN. RASS goal: -1. Daily WUA. Continue preadmission thiamine / folate.  Family updated: Plan d/w pt and girlfriend.   Interdisciplinary Family Meeting v Palliative Care Meeting:  Due by: 04/11/17.  CC time: 45 min.    Monica Becton, MD 04/05/2017, 8:40 AM [facility] Pager [phone] After 3 pm or if no answer, call [phone]

## 2017-04-05 NOTE — Progress Notes (Signed)
Pharmacy Antibiotic Note  John Warren is a 66 y.o. male admitted on 03/29/2017 with possilbe pneumonia and/or TRALI.  Pharmacy has been consulted for Vancomycin, Cefepime, and Flagyl dosing.  Plan: Vancomycin 2g IV x1 then 1g IV every 12 hours (run via central line) Cefepime 1g IV every 8 hours Flagyl 500mg  IV every 8 hours  Monitor renal function and culture results Narrow therapy as able    Height: 5\' 11"  (180.3 cm) Weight: 227 lb 8.2 oz (103.2 kg) IBW/kg (Calculated) : 75.3  Temp (24hrs), Avg:98.2 F (36.8 C), Min:95.2 F (35.1 C), Max:100.4 F (38 C)   Recent Labs Lab 03/29/17 2214  04/02/17 0354 04/03/17 0525 04/04/17 0300 04/04/17 1011 04/04/17 1918 04/04/17 2348 04/04/17 2358 04/05/17 0430  WBC  --   < > 8.8 6.6 12.2*  --   --  24.2*  --  26.4*  CREATININE  --   < > 0.93 0.85 0.94 0.80 0.89  --   --  1.16  LATICACIDVEN 1.91*  --   --   --   --   --   --   --  2.9*  --   < > = values in this interval not displayed.  Estimated Creatinine Clearance: 74.6 mL/min (by C-G formula based on SCr of 1.16 mg/dL).    Allergies  Allergen Reactions  . Penicillins Rash    Has patient had a PCN reaction causing immediate rash, facial/tongue/throat swelling, SOB or lightheadedness with hypotension: Yes Has patient had a PCN reaction causing severe rash involving mucus membranes or skin necrosis: No Has patient had a PCN reaction that required hospitalization: No Has patient had a PCN reaction occurring within the last 10 years: No If all of the above answers are "NO", then may proceed with Cephalosporin use. Had CTX before    Antimicrobials this admission:  Ceftriaxone 4/28 >>5/2 Vancomycin 5/4 >> Cefepime 5/4 >> Flagyl 5/4 >>  Dose adjustments this admission:    Microbiology results:  4/28 Blood x2 - negative 4/27 MRSA PCR - NEG 5/4 Sputum >>  Thank you for allowing pharmacy to be a part of this patient's care.  Sloan Leiter, PharmD, BCPS Clinical  Pharmacist Clinical phone 04/05/2017 until 3:30 [phone] After hours, please call #[phone] 04/05/2017 8:44 AM

## 2017-04-05 NOTE — Progress Notes (Signed)
[facility] Progress Note Patient Name: John Warren DOB: 1951-05-23 MRN: [id number]   Date of Service  04/05/2017  HPI/Events of Note  Request for [facility]. Patient in Lactulose.   eICU Interventions  Will order: 1. Place Flexiseal.      Intervention Category Intermediate Interventions: Other:  Lysle Dingwall 04/05/2017, 7:24 PM

## 2017-04-05 NOTE — Progress Notes (Signed)
CRITICAL VALUE ALERT  Critical value received:  Lactic acid= 3.1 and Troponin 33.25  Date of notification:  04/05/17  Time of notification:  1021  Critical value read back:Yes.    Nurse who received alert:  Allegra Grana, RN  MD notified (1st page):  Dr Edson Snowball  Time of first page:  1021  MD notified (2nd page):  Time of second page:  Responding MD:  Dr Edson Snowball  Time MD responded:  1021

## 2017-04-05 NOTE — Anesthesia Postprocedure Evaluation (Addendum)
Anesthesia Post Note  Patient: John Warren  Procedure(s) Performed: Procedure(s) (LRB): COLONOSCOPY WITH PROPOFOL (N/A)  Patient location during evaluation: PACU Anesthesia Type: MAC Level of consciousness: lethargic and patient cooperative Pain management: pain level controlled Respiratory status: spontaneous breathing, nonlabored ventilation and patient connected to face mask oxygen Cardiovascular status: unstable (pt requiring volume resusitation post-procedure) Anesthetic complications: no Comments: See intra-op notes for detail.  Pt with presumed ongoing blood loss requiring blood transfusion and pressor support to maintain acceptable hemodynamics.  Primary team at bedside.  Transferred to ICU.       Last Vitals:  Vitals:   04/05/17 1215 04/05/17 1221  BP:    Pulse: (!) 115 (!) 115  Resp: (!) 21 20  Temp: (!) 38 C (!) 38 C    Last Pain:  Vitals:   04/05/17 1200  TempSrc: Core (Comment)  PainSc:                  [place]

## 2017-04-05 NOTE — Progress Notes (Signed)
   [facility]  > pt's troponin was 33 from 15 last night.  He is comfortable on the vent. (-) cp.  Asleep now. EKG looks better from 5/3 as 5/3 EKG showed possible ischemia which was not seen in EKG on 5/4.  > I discussed with girlfriend/partner re: elevated troponin and demand ischemia.  Unfortunately, pt has significant GI bleed and hemorrhagic shock and also has cirrhosis and all of these preclude  [place] from giving heparin, ASA, statins, cardiac meds. He is also currently NPO.  > plan to continue present management.  > he remains a full code BUT if he clinically worsens, I think pt and girlfriend are reasonable enough to change code status to DNR.    Monica Becton, MD 04/05/2017, 12:59 PM [facility] Pager [phone] After 3 pm or if no answer, call [phone]

## 2017-04-05 NOTE — Progress Notes (Signed)
Progress Note  Patient Name: John Warren Date of Encounter: 04/05/2017  Primary Cardiologist: Wynonia Lawman  Subjective   Intubated and sedated. Had profound hypotension after endoscopy and received IV albumin and pressors as well as 3 u PRBCs.. ECG showed anterior ST depression again, troponin increased starting a few hours later and now risen to 33. 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 TIPS and sclerosing injection of varices performed by IR today.  Inpatient Medications    Scheduled Meds: . budesonide (PULMICORT) nebulizer solution  0.5 mg Nebulization BID  . chlorhexidine gluconate (MEDLINE KIT)  15 mL Mouth Rinse BID  . Chlorhexidine Gluconate Cloth  6 each Topical Daily  . folic acid  1 mg Intravenous Daily  . furosemide  40 mg Intravenous Q12H  . ipratropium-albuterol  3 mL Nebulization Q6H  . lactulose  10 g Oral BID  . mouth rinse  15 mL Mouth Rinse QID  . pantoprazole (PROTONIX) IV  40 mg Intravenous Q12H  . rifaximin  550 mg Per Tube BID  . rocuronium  50 mg Intravenous Once  . sodium chloride flush  10-40 mL Intracatheter Q12H  . thiamine  100 mg Intravenous Daily   Continuous Infusions: . sodium chloride 20 mL/hr (04/05/17 1405)  . ceFEPime (MAXIPIME) IV Stopped (04/05/17 1345)  . DOPamine    . fentaNYL infusion INTRAVENOUS 200 mcg/hr (04/05/17 1400)  . metronidazole Stopped (04/05/17 1038)  . norepinephrine (LEVOPHED) Adult infusion 40 mcg/min (04/05/17 1410)  . octreotide  (SANDOSTATIN)    IV infusion 50 mcg/hr (04/05/17 1400)  . phenylephrine 86m/500mL (0.178mmL) infusion 300 mcg/min (04/05/17 1400)  . vancomycin (VANCOCIN) IVPB 1000 mg/100 mL central line 1,000 mg (04/05/17 1411)   PRN Meds: acetaminophen **OR** acetaminophen, fentaNYL, midazolam, midazolam, sodium chloride flush   Vital Signs    Vitals:   04/05/17 1230 04/05/17 1245 04/05/17 1300 04/05/17 1315  BP: 103/60  (!) 109/58   Pulse: (!) 117 (!) 114 (!) 113 (!) 113  Resp:  (!) 22 (!) 24 (!) 21 (!) 21  Temp: (!) 100.4 F (38 C) (!) 100.4 F (38 C) (!) 100.4 F (38 C) (!) 100.6 F (38.1 C)  TempSrc:      SpO2: 99% 99% 100% 99%  Weight:      Height:        Intake/Output Summary (Last 24 hours) at 04/05/17 1431 Last data filed at 04/05/17 1400  Gross per 24 hour  Intake           8088.7 ml  Output             4105 ml  Net           3983.7 ml   Filed Weights   04/04/17 0539 04/04/17 0802 04/05/17 0130  Weight: 94.3 kg (207 lb 14.3 oz) 93.9 kg (207 lb) 103.2 kg (227 lb 8.2 oz)    Telemetry    Sinus tachycardia - Personally Reviewed  ECG    NSR, anterolateral ST depression on yesterday's tracing is almost completely resolved - Personally Reviewed  Physical Exam  Intubated, sedated GEN: No acute distress.   Neck: No JVD Cardiac: RRR, no murmurs, rubs, or gallops.  Respiratory: Clear to auscultation bilaterally. GI: Soft, nontender, non-distended  MS: No edema; No deformity. Neuro:  Not tested  Psych: Normal affect   Labs    Chemistry Recent Labs Lab 03/30/17 0228  04/01/17 0301  04/04/17 0300 04/04/17 1011  04/04/17 1609 04/04/17 1918 04/05/17 0430  NA 135  < >  135  < > 135 135  < > 140 137 137  K 4.4  < > 3.8  < > 4.8 4.2  < > 3.7 3.8 4.3  CL 109  < > 111  < > 103 103  --   --  102 103  CO2 19*  < > 20*  < > 22  --   --   --  25 23  GLUCOSE 153*  < > 130*  < > 136* 128*  --   --  192* 135*  BUN 29*  < > 13  < > 9 9  --   --  9 11  CREATININE 0.93  < > 0.78  < > 0.94 0.80  --   --  0.89 1.16  CALCIUM 8.3*  < > 7.4*  < > 7.9*  --   --   --  7.2* 7.2*  PROT 5.0*  --  4.2*  --   --   --   --   --  5.6*  --   ALBUMIN 3.0*  --  2.3*  --   --   --   --   --  3.5  --   AST 33  --  26  --   --   --   --   --  66*  --   ALT 23  --  18  --   --   --   --   --  25  --   ALKPHOS 70  --  52  --   --   --   --   --  63  --   BILITOT 1.1  --  0.7  --   --   --   --   --  3.3*  --   GFRNONAA >60  < > >60  < > >60  --   --   --  >60 >60    GFRAA >60  < > >60  < > >60  --   --   --  >60 >60  ANIONGAP 7  < > 4*  < > 10  --   --   --  10 11  < > = values in this interval not displayed.   Hematology Recent Labs Lab 04/04/17 0300  04/04/17 1530  04/04/17 2348 04/05/17 0430 04/05/17 1115  WBC 12.2*  --   --   --  24.2* 26.4*  --   RBC 2.67*  --   --   --  4.06* 4.13*  --   HGB 8.3*  < >  --   < > 11.7* 12.0* 12.1*  HCT 24.7*  < >  --   < > 35.8* 36.4* 35.6*  MCV 92.5  --   --   --  88.2 88.1  --   MCH 31.1  --   --   --  28.8 29.1  --   MCHC 33.6  --   --   --  32.7 33.0  --   RDW 16.9*  --   --   --  16.7* 17.3*  --   PLT 118*  --  181  --  171 186  --   < > = values in this interval not displayed.  Cardiac Enzymes Recent Labs Lab 03/31/17 0812 04/04/17 1918 04/04/17 2348 04/05/17 0923  TROPONINI 0.05* 3.85* 15.43* 33.25*   No results for input(s): TROPIPOC in the last 168 hours.   BNP Recent Labs  Lab 04/04/17 1530  BNP 96.1     DDimer No results for input(s): DDIMER in the last 168 hours.   Radiology    Ir Angiogram Selective Each Additional Vessel  Result Date: 04/05/2017 CLINICAL DATA:  Cirrhosis with gastric varices. Acute hemorrhage and hemodynamic instability. See previous consultation by Dr. Anselm Pancoast. EXAM: 1. TRANSJUGULAR INTRAHEPATIC PORTOSYSTEMIC SHUNT 2. IR PARACENTESIS 1. ULTRASOUND GUIDANCE FOR VENOUS ACCESS x2 2. TRANSJUGLAR INTRHEPATIC PORTOSYSTEMIC SHUNT (TIPS) CREATION 3. SELECTIVE GASTRIC VARICEAL VENOGRAM 4. PERCUTANEOUS SCLEROSANT EMBOLIZATION ANESTHESIA/SEDATION: General - as administered by the Anesthesia department MEDICATIONS: Sodium Tetradecyl Sulfate 42m, lipiodol 286miv CONTRAST:  2008mSOVUE-300 IOPAMIDOL (ISOVUE-300) INJECTION 61% FLUOROSCOPY TIME:  Fluoroscopy Time: 61 min 36 sec [id number] COMPLICATIONS: None immediate. PROCEDURE: Informed written consent was obtained from the family after a thorough discussion of the procedural risks, benefits and alternatives. All questions were  addressed. Maximal Sterile Barrier Technique was utilized including caps, mask, sterile gowns, sterile gloves, sterile drape, hand hygiene and skin antiseptic. A timeout was performed prior to the initiation of the procedure. The skin overlying the right upper abdominal quadrant as well as the right neck and femoral region were prepped and draped in usual sterile fashion. Initial ultrasound scanning demonstrates a small amount of perihepatic ascites ; no paracentesis was indicated. Under direct ultrasound guidance, a peripheral aspect of the right portal vein was accessed with a 22 gauge needle. Ultrasound image was saved for procedural documentation purposes. The track was dilated with the inner 3 French catheter from the [place]. Several contrast limited portal venograms were performed. A Nitrex wire was advanced through the 3 Fre[place]istantheter with the radiopaque transition positioned at the target entrance to the peripheral right portal vein. Next, the right internal jugular vein was accessed under direct ultrasound. Ultrasound image was saved for procedural documentation purposes. This allowed for placement of the 10 French TIPS vascular sheath. With the aid of angiographic guidewires, an MPA catheter was utilized to select the right hepatic vein and a hepatic venogram was performed. Under fluoroscopic guidance, the right portal vein with targeted with a Rsch-Uchida TIPS needle directed at the radiopaque target within the right portal vein. Ultimately, the right portal vein was accessed at a desirable location allowing advancement of a stiff Glidewire into the main portal vein. A 4 French glide catheter was advanced over the stiff Glidewire and a portal venogram was performed. Next, as there was difficulty advancing a measuring catheter through the intrahepatic track, the track was dilated with a 4 mm Mustang balloon, allowing advancement measuring pigtail into the right portal vein. A portal venogram was  performed with a measuring Omni Flush catheter. Tract further dilated with 8 mm Conquest balloon to facilitate advancement of the sheath into the main portal vein. The percutaneous portal microcatheter and wire were removed. Next, a 2 cm (un covered) x 6 cm (covered) x 10 mm GORE VIATORR TIPS Endoprosthesis was advanced through the intra portal track and deployed. The TIPS stent was angioplastied in multiple stations to 10 mm diameter. Follow-up portal venogram demonstrated good flow, no residual stenosis. There is continued retrograde collateral flow through dilated left gastric vein to supply the gastric varices, ultimately draining for retroperitoneal collaterals into the left renal vein. A 5 French angiographic catheter was advanced into the dilated left gastric vein with the aid of an Amplatz Glidewire. Under direct ultrasound guidance, the right common femoral vein was accessed with a micropuncture kit. An ultrasound image was saved for procedural documentation purposes. Over  a Bentson wire, the track was dilated allowing for placement of a 10 French TIPS sheath to the level of the mid IVC under intermittent fluoroscopic guidance. A Cobra catheter was utilized to select the left renal vein and a left renal venogram was performed. A Rosen wire was advanced into the caudal pole of the left kidney as the access sheath was advanced to the origin of left renal vein. Next, a 4 French angled glide catheter was cannulated within the sheath, next to the [place] wire, and with the use of a regular glidewire, was utilized to select the outflow origin of the hypertrophied gastric varix. The Rosen wire was then removed and over a coiled Amplatz wire, the vascular sheath was advanced into the outflow of the splenorenal shunt. Contrast injection confirmed appropriate positioning. The left gastric vein catheter was exchanged for a 100 cm Renato Battles scientific occlusion balloon. A 65 cm [place] scientific occlusion balloon was then  advanced into the caudal aspect of the outflow of the splenorenal shunt. Inflation of the left gastric balloon and subsequent injection confirmed stasis of flow in the gastric varix complex. Inflation of the splenorenal shunt balloon pulled caudal to near vessel's confluence with the left renal veinwith subsequent left gastric injection confirmed stasis of flow in the gastric varix complex. Next, a Lantern micro catheter was advanced through the splenorenal occlusion balloon into the caudal aspect of the markedly hypertrophied shunt. Venography confirmed appropriate positioning. A second Lantern microcatheter was advanced through the left gastric venous occlusion balloon into the left gastric vein. The left gastric occlusion balloon was inflated. At this time, sclerosant was administered (in a mixture of 2 cc of Lipiodol, 4 cc of STS and 6 cc of air) under fluoroscopic guidance. Multiple spot fluoroscopic and radiographic images were obtained during the sclerosant administration. Once sclerosis and was noted into the proximal aspect of the splenorenal shunt, the inferior balloon was inflated. At this time multiple overlapping Ruby coils were deployed within the left renal vein and subsequently within the caudal aspect of the splenorenral shunt. A completion left renal venogram was performed and the procedure was terminated. Follow-up splenic venogram confirms no further flow through the gastric variceal complex. Good antegrade flow through the TIPS shunt. Continued hepatopetal antegrade perfusion through the left portal vein. All wires, catheters and sheaths were removed from the patient. Hemostasis was achieved at the right IJ and groin access sites with manual compression. A dressing was placed. The patient tolerated the procedure well. Patient transferred to the PACU. Operators:  Henn/ Hassell/ Watts IMPRESSION: 1. Successful creation of a TIPS with reduction of portosystemic gradient to 3 mmHg. 2. Successful  ultrasound-guided paracentesis yielding 13 L of ascitic fluid. PLAN: - Continued in-patient management as per the providing clinical service. - Followup post-BATO abd CT  when clinically stable - Patient be seen in follow-up consultation at the interventional radiology clinic with postprocedural TIPS ultrasound and labs (CMP, INR and ammonia levels) in 4-6 weeks. Electronically Signed   By: Lucrezia Europe M.D.   On: 04/05/2017 11:43   Ir Angiogram Selective Each Additional Vessel  Result Date: 04/05/2017 CLINICAL DATA:  Cirrhosis with gastric varices. Acute hemorrhage and hemodynamic instability. See previous consultation by Dr. Anselm Pancoast. EXAM: 1. TRANSJUGULAR INTRAHEPATIC PORTOSYSTEMIC SHUNT 2. IR PARACENTESIS 1. ULTRASOUND GUIDANCE FOR VENOUS ACCESS x2 2. TRANSJUGLAR INTRHEPATIC PORTOSYSTEMIC SHUNT (TIPS) CREATION 3. SELECTIVE GASTRIC VARICEAL VENOGRAM 4. PERCUTANEOUS SCLEROSANT EMBOLIZATION ANESTHESIA/SEDATION: General - as administered by the Anesthesia department MEDICATIONS: Sodium Tetradecyl Sulfate 58m, lipiodol 285m  iv CONTRAST:  230m ISOVUE-300 IOPAMIDOL (ISOVUE-300) INJECTION 61% FLUOROSCOPY TIME:  Fluoroscopy Time: 61 min 36 sec 17.517Gy COMPLICATIONS: None immediate. PROCEDURE: Informed written consent was obtained from the family after a thorough discussion of the procedural risks, benefits and alternatives. All questions were addressed. Maximal Sterile Barrier Technique was utilized including caps, mask, sterile gowns, sterile gloves, sterile drape, hand hygiene and skin antiseptic. A timeout was performed prior to the initiation of the procedure. The skin overlying the right upper abdominal quadrant as well as the right neck and femoral region were prepped and draped in usual sterile fashion. Initial ultrasound scanning demonstrates a small amount of perihepatic ascites ; no paracentesis was indicated. Under direct ultrasound guidance, a peripheral aspect of the right portal vein was accessed with a 22  gauge needle. Ultrasound image was saved for procedural documentation purposes. The track was dilated with the inner 3 French catheter from the [place]. Several contrast limited portal venograms were performed. A Nitrex wire was advanced through the 3 FPakistancatheter with the radiopaque transition positioned at the target entrance to the peripheral right portal vein. Next, the right internal jugular vein was accessed under direct ultrasound. Ultrasound image was saved for procedural documentation purposes. This allowed for placement of the 10 French TIPS vascular sheath. With the aid of angiographic guidewires, an MPA catheter was utilized to select the right hepatic vein and a hepatic venogram was performed. Under fluoroscopic guidance, the right portal vein with targeted with a Rsch-Uchida TIPS needle directed at the radiopaque target within the right portal vein. Ultimately, the right portal vein was accessed at a desirable location allowing advancement of a stiff Glidewire into the main portal vein. A 4 French glide catheter was advanced over the stiff Glidewire and a portal venogram was performed. Next, as there was difficulty advancing a measuring catheter through the intrahepatic track, the track was dilated with a 4 mm Mustang balloon, allowing advancement measuring pigtail into the right portal vein. A portal venogram was performed with a measuring Omni Flush catheter. Tract further dilated with 8 mm Conquest balloon to facilitate advancement of the sheath into the main portal vein. The percutaneous portal microcatheter and wire were removed. Next, a 2 cm (un covered) x 6 cm (covered) x 10 mm GORE VIATORR TIPS Endoprosthesis was advanced through the intra portal track and deployed. The TIPS stent was angioplastied in multiple stations to 10 mm diameter. Follow-up portal venogram demonstrated good flow, no residual stenosis. There is continued retrograde collateral flow through dilated left gastric  vein to supply the gastric varices, ultimately draining for retroperitoneal collaterals into the left renal vein. A 5 French angiographic catheter was advanced into the dilated left gastric vein with the aid of an Amplatz Glidewire. Under direct ultrasound guidance, the right common femoral vein was accessed with a micropuncture kit. An ultrasound image was saved for procedural documentation purposes. Over a Bentson wire, the track was dilated allowing for placement of a 10 French TIPS sheath to the level of the mid IVC under intermittent fluoroscopic guidance. A Cobra catheter was utilized to select the left renal vein and a left renal venogram was performed. A Rosen wire was advanced into the caudal pole of the left kidney as the access sheath was advanced to the origin of left renal vein. Next, a 4 French angled glide catheter was cannulated within the sheath, next to the R[place]manwire, and with the use of a regular glidewire, was utilized to select the outflow origin  of the hypertrophied gastric varix. The Rosen wire was then removed and over a coiled Amplatz wire, the vascular sheath was advanced into the outflow of the splenorenal shunt. Contrast injection confirmed appropriate positioning. The left gastric vein catheter was exchanged for a 100 cm Renato Battles scientific occlusion balloon. A 65 cm [place] scientific occlusion balloon was then advanced into the caudal aspect of the outflow of the splenorenal shunt. Inflation of the left gastric balloon and subsequent injection confirmed stasis of flow in the gastric varix complex. Inflation of the splenorenal shunt balloon pulled caudal to near vessel's confluence with the left renal veinwith subsequent left gastric injection confirmed stasis of flow in the gastric varix complex. Next, a Lantern micro catheter was advanced through the splenorenal occlusion balloon into the caudal aspect of the markedly hypertrophied shunt. Venography confirmed appropriate positioning. A  second Lantern microcatheter was advanced through the left gastric venous occlusion balloon into the left gastric vein. The left gastric occlusion balloon was inflated. At this time, sclerosant was administered (in a mixture of 2 cc of Lipiodol, 4 cc of STS and 6 cc of air) under fluoroscopic guidance. Multiple spot fluoroscopic and radiographic images were obtained during the sclerosant administration. Once sclerosis and was noted into the proximal aspect of the splenorenal shunt, the inferior balloon was inflated. At this time multiple overlapping Ruby coils were deployed within the left renal vein and subsequently within the caudal aspect of the splenorenral shunt. A completion left renal venogram was performed and the procedure was terminated. Follow-up splenic venogram confirms no further flow through the gastric variceal complex. Good antegrade flow through the TIPS shunt. Continued hepatopetal antegrade perfusion through the left portal vein. All wires, catheters and sheaths were removed from the patient. Hemostasis was achieved at the right IJ and groin access sites with manual compression. A dressing was placed. The patient tolerated the procedure well. Patient transferred to the PACU. Operators:  Henn/ Hassell/ Watts IMPRESSION: 1. Successful creation of a TIPS with reduction of portosystemic gradient to 3 mmHg. 2. Successful ultrasound-guided paracentesis yielding 13 L of ascitic fluid. PLAN: - Continued in-patient management as per the providing clinical service. - Followup post-BATO abd CT  when clinically stable - Patient be seen in follow-up consultation at the interventional radiology clinic with postprocedural TIPS ultrasound and labs (CMP, INR and ammonia levels) in 4-6 weeks. Electronically Signed   By: Lucrezia Europe M.D.   On: 04/05/2017 11:43   Ir Venogram Renal Uni Right  Result Date: 04/05/2017 CLINICAL DATA:  Cirrhosis with gastric varices. Acute hemorrhage and hemodynamic instability. See  previous consultation by Dr. Anselm Pancoast. EXAM: 1. TRANSJUGULAR INTRAHEPATIC PORTOSYSTEMIC SHUNT 2. IR PARACENTESIS 1. ULTRASOUND GUIDANCE FOR VENOUS ACCESS x2 2. TRANSJUGLAR INTRHEPATIC PORTOSYSTEMIC SHUNT (TIPS) CREATION 3. SELECTIVE GASTRIC VARICEAL VENOGRAM 4. PERCUTANEOUS SCLEROSANT EMBOLIZATION ANESTHESIA/SEDATION: General - as administered by the Anesthesia department MEDICATIONS: Sodium Tetradecyl Sulfate 64m, lipiodol 259miv CONTRAST:  2001mSOVUE-300 IOPAMIDOL (ISOVUE-300) INJECTION 61% FLUOROSCOPY TIME:  Fluoroscopy Time: 61 min 36 sec [id number] COMPLICATIONS: None immediate. PROCEDURE: Informed written consent was obtained from the family after a thorough discussion of the procedural risks, benefits and alternatives. All questions were addressed. Maximal Sterile Barrier Technique was utilized including caps, mask, sterile gowns, sterile gloves, sterile drape, hand hygiene and skin antiseptic. A timeout was performed prior to the initiation of the procedure. The skin overlying the right upper abdominal quadrant as well as the right neck and femoral region were prepped and draped in usual sterile fashion. Initial  ultrasound scanning demonstrates a small amount of perihepatic ascites ; no paracentesis was indicated. Under direct ultrasound guidance, a peripheral aspect of the right portal vein was accessed with a 22 gauge needle. Ultrasound image was saved for procedural documentation purposes. The track was dilated with the inner 3 French catheter from the [place] set. Several contrast limited portal venograms were performed. A Nitrex wire was advanced through the 3 [place] catheter with the radiopaque transition positioned at the target entrance to the peripheral right portal vein. Next, the right internal jugular vein was accessed under direct ultrasound. Ultrasound image was saved for procedural documentation purposes. This allowed for placement of the 10 French TIPS vascular sheath. With the aid of  angiographic guidewires, an MPA catheter was utilized to select the right hepatic vein and a hepatic venogram was performed. Under fluoroscopic guidance, the right portal vein with targeted with a Rsch-Uchida TIPS needle directed at the radiopaque target within the right portal vein. Ultimately, the right portal vein was accessed at a desirable location allowing advancement of a stiff Glidewire into the main portal vein. A 4 French glide catheter was advanced over the stiff Glidewire and a portal venogram was performed. Next, as there was difficulty advancing a measuring catheter through the intrahepatic track, the track was dilated with a 4 mm Mustang balloon, allowing advancement measuring pigtail into the right portal vein. A portal venogram was performed with a measuring Omni Flush catheter. Tract further dilated with 8 mm Conquest balloon to facilitate advancement of the sheath into the main portal vein. The percutaneous portal microcatheter and wire were removed. Next, a 2 cm (un covered) x 6 cm (covered) x 10 mm GORE VIATORR TIPS Endoprosthesis was advanced through the intra portal track and deployed. The TIPS stent was angioplastied in multiple stations to 10 mm diameter. Follow-up portal venogram demonstrated good flow, no residual stenosis. There is continued retrograde collateral flow through dilated left gastric vein to supply the gastric varices, ultimately draining for retroperitoneal collaterals into the left renal vein. A 5 French angiographic catheter was advanced into the dilated left gastric vein with the aid of an Amplatz Glidewire. Under direct ultrasound guidance, the right common femoral vein was accessed with a micropuncture kit. An ultrasound image was saved for procedural documentation purposes. Over a Bentson wire, the track was dilated allowing for placement of a 10 French TIPS sheath to the level of the mid IVC under intermittent fluoroscopic guidance. A Cobra catheter was utilized to  select the left renal vein and a left renal venogram was performed. A Rosen wire was advanced into the caudal pole of the left kidney as the access sheath was advanced to the origin of left renal vein. Next, a 4 French angled glide catheter was cannulated within the sheath, next to the [place] wire, and with the use of a regular glidewire, was utilized to select the outflow origin of the hypertrophied gastric varix. The Rosen wire was then removed and over a coiled Amplatz wire, the vascular sheath was advanced into the outflow of the splenorenal shunt. Contrast injection confirmed appropriate positioning. The left gastric vein catheter was exchanged for a 100 cm Renato Battles scientific occlusion balloon. A 65 cm [place] scientific occlusion balloon was then advanced into the caudal aspect of the outflow of the splenorenal shunt. Inflation of the left gastric balloon and subsequent injection confirmed stasis of flow in the gastric varix complex. Inflation of the splenorenal shunt balloon pulled caudal to near vessel's confluence with the left renal  veinwith subsequent left gastric injection confirmed stasis of flow in the gastric varix complex. Next, a Lantern micro catheter was advanced through the splenorenal occlusion balloon into the caudal aspect of the markedly hypertrophied shunt. Venography confirmed appropriate positioning. A second Lantern microcatheter was advanced through the left gastric venous occlusion balloon into the left gastric vein. The left gastric occlusion balloon was inflated. At this time, sclerosant was administered (in a mixture of 2 cc of Lipiodol, 4 cc of STS and 6 cc of air) under fluoroscopic guidance. Multiple spot fluoroscopic and radiographic images were obtained during the sclerosant administration. Once sclerosis and was noted into the proximal aspect of the splenorenal shunt, the inferior balloon was inflated. At this time multiple overlapping Ruby coils were deployed within the left  renal vein and subsequently within the caudal aspect of the splenorenral shunt. A completion left renal venogram was performed and the procedure was terminated. Follow-up splenic venogram confirms no further flow through the gastric variceal complex. Good antegrade flow through the TIPS shunt. Continued hepatopetal antegrade perfusion through the left portal vein. All wires, catheters and sheaths were removed from the patient. Hemostasis was achieved at the right IJ and groin access sites with manual compression. A dressing was placed. The patient tolerated the procedure well. Patient transferred to the PACU. Operators:  Henn/ Hassell/ Watts IMPRESSION: 1. Successful creation of a TIPS with reduction of portosystemic gradient to 3 mmHg. 2. Successful ultrasound-guided paracentesis yielding 13 L of ascitic fluid. PLAN: - Continued in-patient management as per the providing clinical service. - Followup post-BATO abd CT  when clinically stable - Patient be seen in follow-up consultation at the interventional radiology clinic with postprocedural TIPS ultrasound and labs (CMP, INR and ammonia levels) in 4-6 weeks. Electronically Signed   By: Lucrezia Europe M.D.   On: 04/05/2017 11:43   Ir Tips  Result Date: 04/05/2017 CLINICAL DATA:  Cirrhosis with gastric varices. Acute hemorrhage and hemodynamic instability. See previous consultation by Dr. Anselm Pancoast. EXAM: 1. TRANSJUGULAR INTRAHEPATIC PORTOSYSTEMIC SHUNT 2. IR PARACENTESIS 1. ULTRASOUND GUIDANCE FOR VENOUS ACCESS x2 2. TRANSJUGLAR INTRHEPATIC PORTOSYSTEMIC SHUNT (TIPS) CREATION 3. SELECTIVE GASTRIC VARICEAL VENOGRAM 4. PERCUTANEOUS SCLEROSANT EMBOLIZATION ANESTHESIA/SEDATION: General - as administered by the Anesthesia department MEDICATIONS: Sodium Tetradecyl Sulfate 10m, lipiodol 249miv CONTRAST:  20024mSOVUE-300 IOPAMIDOL (ISOVUE-300) INJECTION 61% FLUOROSCOPY TIME:  Fluoroscopy Time: 61 min 36 sec [id number] COMPLICATIONS: None immediate. PROCEDURE: Informed written  consent was obtained from the family after a thorough discussion of the procedural risks, benefits and alternatives. All questions were addressed. Maximal Sterile Barrier Technique was utilized including caps, mask, sterile gowns, sterile gloves, sterile drape, hand hygiene and skin antiseptic. A timeout was performed prior to the initiation of the procedure. The skin overlying the right upper abdominal quadrant as well as the right neck and femoral region were prepped and draped in usual sterile fashion. Initial ultrasound scanning demonstrates a small amount of perihepatic ascites ; no paracentesis was indicated. Under direct ultrasound guidance, a peripheral aspect of the right portal vein was accessed with a 22 gauge needle. Ultrasound image was saved for procedural documentation purposes. The track was dilated with the inner 3 French catheter from the [employer]. Several contrast limited portal venograms were performed. A Nitrex wire was advanced through the 3 FrePakistantheter with the radiopaque transition positioned at the target entrance to the peripheral right portal vein. Next, the right internal jugular vein was accessed under direct ultrasound. Ultrasound image was saved for procedural documentation purposes. This  allowed for placement of the 10 French TIPS vascular sheath. With the aid of angiographic guidewires, an MPA catheter was utilized to select the right hepatic vein and a hepatic venogram was performed. Under fluoroscopic guidance, the right portal vein with targeted with a Rsch-Uchida TIPS needle directed at the radiopaque target within the right portal vein. Ultimately, the right portal vein was accessed at a desirable location allowing advancement of a stiff Glidewire into the main portal vein. A 4 French glide catheter was advanced over the stiff Glidewire and a portal venogram was performed. Next, as there was difficulty advancing a measuring catheter through the intrahepatic track, the  track was dilated with a 4 mm Mustang balloon, allowing advancement measuring pigtail into the right portal vein. A portal venogram was performed with a measuring Omni Flush catheter. Tract further dilated with 8 mm Conquest balloon to facilitate advancement of the sheath into the main portal vein. The percutaneous portal microcatheter and wire were removed. Next, a 2 cm (un covered) x 6 cm (covered) x 10 mm GORE VIATORR TIPS Endoprosthesis was advanced through the intra portal track and deployed. The TIPS stent was angioplastied in multiple stations to 10 mm diameter. Follow-up portal venogram demonstrated good flow, no residual stenosis. There is continued retrograde collateral flow through dilated left gastric vein to supply the gastric varices, ultimately draining for retroperitoneal collaterals into the left renal vein. A 5 French angiographic catheter was advanced into the dilated left gastric vein with the aid of an Amplatz Glidewire. Under direct ultrasound guidance, the right common femoral vein was accessed with a micropuncture kit. An ultrasound image was saved for procedural documentation purposes. Over a Bentson wire, the track was dilated allowing for placement of a 10 French TIPS sheath to the level of the mid IVC under intermittent fluoroscopic guidance. A Cobra catheter was utilized to select the left renal vein and a left renal venogram was performed. A Rosen wire was advanced into the caudal pole of the left kidney as the access sheath was advanced to the origin of left renal vein. Next, a 4 French angled glide catheter was cannulated within the sheath, next to the [place] wire, and with the use of a regular glidewire, was utilized to select the outflow origin of the hypertrophied gastric varix. The Rosen wire was then removed and over a coiled Amplatz wire, the vascular sheath was advanced into the outflow of the splenorenal shunt. Contrast injection confirmed appropriate positioning. The left  gastric vein catheter was exchanged for a 100 cm Renato Battles scientific occlusion balloon. A 65 cm [place] scientific occlusion balloon was then advanced into the caudal aspect of the outflow of the splenorenal shunt. Inflation of the left gastric balloon and subsequent injection confirmed stasis of flow in the gastric varix complex. Inflation of the splenorenal shunt balloon pulled caudal to near vessel's confluence with the left renal veinwith subsequent left gastric injection confirmed stasis of flow in the gastric varix complex. Next, a Lantern micro catheter was advanced through the splenorenal occlusion balloon into the caudal aspect of the markedly hypertrophied shunt. Venography confirmed appropriate positioning. A second Lantern microcatheter was advanced through the left gastric venous occlusion balloon into the left gastric vein. The left gastric occlusion balloon was inflated. At this time, sclerosant was administered (in a mixture of 2 cc of Lipiodol, 4 cc of STS and 6 cc of air) under fluoroscopic guidance. Multiple spot fluoroscopic and radiographic images were obtained during the sclerosant administration. Once sclerosis and was noted  into the proximal aspect of the splenorenal shunt, the inferior balloon was inflated. At this time multiple overlapping Ruby coils were deployed within the left renal vein and subsequently within the caudal aspect of the splenorenral shunt. A completion left renal venogram was performed and the procedure was terminated. Follow-up splenic venogram confirms no further flow through the gastric variceal complex. Good antegrade flow through the TIPS shunt. Continued hepatopetal antegrade perfusion through the left portal vein. All wires, catheters and sheaths were removed from the patient. Hemostasis was achieved at the right IJ and groin access sites with manual compression. A dressing was placed. The patient tolerated the procedure well. Patient transferred to the PACU.  Operators:  Henn/ Hassell/ Watts IMPRESSION: 1. Successful creation of a TIPS with reduction of portosystemic gradient to 3 mmHg. 2. Successful ultrasound-guided paracentesis yielding 13 L of ascitic fluid. PLAN: - Continued in-patient management as per the providing clinical service. - Followup post-BATO abd CT  when clinically stable - Patient be seen in follow-up consultation at the interventional radiology clinic with postprocedural TIPS ultrasound and labs (CMP, INR and ammonia levels) in 4-6 weeks. Electronically Signed   By: Lucrezia Europe M.D.   On: 04/05/2017 11:43   Ir US Guide Vasc Access Right  Result Date: 04/05/2017 CLINICAL DATA:  Cirrhosis with gastric varices. Acute hemorrhage and hemodynamic instability. See previous consultation by Dr. Anselm Pancoast. EXAM: 1. TRANSJUGULAR INTRAHEPATIC PORTOSYSTEMIC SHUNT 2. IR PARACENTESIS 1. ULTRASOUND GUIDANCE FOR VENOUS ACCESS x2 2. TRANSJUGLAR INTRHEPATIC PORTOSYSTEMIC SHUNT (TIPS) CREATION 3. SELECTIVE GASTRIC VARICEAL VENOGRAM 4. PERCUTANEOUS SCLEROSANT EMBOLIZATION ANESTHESIA/SEDATION: General - as administered by the Anesthesia department MEDICATIONS: Sodium Tetradecyl Sulfate 34m, lipiodol 218miv CONTRAST:  20074mSOVUE-300 IOPAMIDOL (ISOVUE-300) INJECTION 61% FLUOROSCOPY TIME:  Fluoroscopy Time: 61 min 36 sec [id number] COMPLICATIONS: None immediate. PROCEDURE: Informed written consent was obtained from the family after a thorough discussion of the procedural risks, benefits and alternatives. All questions were addressed. Maximal Sterile Barrier Technique was utilized including caps, mask, sterile gowns, sterile gloves, sterile drape, hand hygiene and skin antiseptic. A timeout was performed prior to the initiation of the procedure. The skin overlying the right upper abdominal quadrant as well as the right neck and femoral region were prepped and draped in usual sterile fashion. Initial ultrasound scanning demonstrates a small amount of perihepatic ascites ; no  paracentesis was indicated. Under direct ultrasound guidance, a peripheral aspect of the right portal vein was accessed with a 22 gauge needle. Ultrasound image was saved for procedural documentation purposes. The track was dilated with the inner 3 French catheter from the [employer]. Several contrast limited portal venograms were performed. A Nitrex wire was advanced through the 3 FrePakistantheter with the radiopaque transition positioned at the target entrance to the peripheral right portal vein. Next, the right internal jugular vein was accessed under direct ultrasound. Ultrasound image was saved for procedural documentation purposes. This allowed for placement of the 10 French TIPS vascular sheath. With the aid of angiographic guidewires, an MPA catheter was utilized to select the right hepatic vein and a hepatic venogram was performed. Under fluoroscopic guidance, the right portal vein with targeted with a Rsch-Uchida TIPS needle directed at the radiopaque target within the right portal vein. Ultimately, the right portal vein was accessed at a desirable location allowing advancement of a stiff Glidewire into the main portal vein. A 4 French glide catheter was advanced over the stiff Glidewire and a portal venogram was performed. Next, as there was difficulty advancing a  measuring catheter through the intrahepatic track, the track was dilated with a 4 mm Mustang balloon, allowing advancement measuring pigtail into the right portal vein. A portal venogram was performed with a measuring Omni Flush catheter. Tract further dilated with 8 mm Conquest balloon to facilitate advancement of the sheath into the main portal vein. The percutaneous portal microcatheter and wire were removed. Next, a 2 cm (un covered) x 6 cm (covered) x 10 mm GORE VIATORR TIPS Endoprosthesis was advanced through the intra portal track and deployed. The TIPS stent was angioplastied in multiple stations to 10 mm diameter. Follow-up portal  venogram demonstrated good flow, no residual stenosis. There is continued retrograde collateral flow through dilated left gastric vein to supply the gastric varices, ultimately draining for retroperitoneal collaterals into the left renal vein. A 5 French angiographic catheter was advanced into the dilated left gastric vein with the aid of an Amplatz Glidewire. Under direct ultrasound guidance, the right common femoral vein was accessed with a micropuncture kit. An ultrasound image was saved for procedural documentation purposes. Over a Bentson wire, the track was dilated allowing for placement of a 10 French TIPS sheath to the level of the mid IVC under intermittent fluoroscopic guidance. A Cobra catheter was utilized to select the left renal vein and a left renal venogram was performed. A Rosen wire was advanced into the caudal pole of the left kidney as the access sheath was advanced to the origin of left renal vein. Next, a 4 French angled glide catheter was cannulated within the sheath, next to the [place] wire, and with the use of a regular glidewire, was utilized to select the outflow origin of the hypertrophied gastric varix. The Rosen wire was then removed and over a coiled Amplatz wire, the vascular sheath was advanced into the outflow of the splenorenal shunt. Contrast injection confirmed appropriate positioning. The left gastric vein catheter was exchanged for a 100 cm Renato Battles scientific occlusion balloon. A 65 cm [place] scientific occlusion balloon was then advanced into the caudal aspect of the outflow of the splenorenal shunt. Inflation of the left gastric balloon and subsequent injection confirmed stasis of flow in the gastric varix complex. Inflation of the splenorenal shunt balloon pulled caudal to near vessel's confluence with the left renal veinwith subsequent left gastric injection confirmed stasis of flow in the gastric varix complex. Next, a Lantern micro catheter was advanced through the  splenorenal occlusion balloon into the caudal aspect of the markedly hypertrophied shunt. Venography confirmed appropriate positioning. A second Lantern microcatheter was advanced through the left gastric venous occlusion balloon into the left gastric vein. The left gastric occlusion balloon was inflated. At this time, sclerosant was administered (in a mixture of 2 cc of Lipiodol, 4 cc of STS and 6 cc of air) under fluoroscopic guidance. Multiple spot fluoroscopic and radiographic images were obtained during the sclerosant administration. Once sclerosis and was noted into the proximal aspect of the splenorenal shunt, the inferior balloon was inflated. At this time multiple overlapping Ruby coils were deployed within the left renal vein and subsequently within the caudal aspect of the splenorenral shunt. A completion left renal venogram was performed and the procedure was terminated. Follow-up splenic venogram confirms no further flow through the gastric variceal complex. Good antegrade flow through the TIPS shunt. Continued hepatopetal antegrade perfusion through the left portal vein. All wires, catheters and sheaths were removed from the patient. Hemostasis was achieved at the right IJ and groin access sites with manual compression. A dressing  was placed. The patient tolerated the procedure well. Patient transferred to the PACU. Operators:  Henn/ Hassell/ Watts IMPRESSION: 1. Successful creation of a TIPS with reduction of portosystemic gradient to 3 mmHg. 2. Successful ultrasound-guided paracentesis yielding 13 L of ascitic fluid. PLAN: - Continued in-patient management as per the providing clinical service. - Followup post-BATO abd CT  when clinically stable - Patient be seen in follow-up consultation at the interventional radiology clinic with postprocedural TIPS ultrasound and labs (CMP, INR and ammonia levels) in 4-6 weeks. Electronically Signed   By: Lucrezia Europe M.D.   On: 04/05/2017 11:43   Dg Chest Port 1  View  Result Date: 04/05/2017 CLINICAL DATA:  Respiratory failure. EXAM: PORTABLE CHEST 1 VIEW COMPARISON:  04/04/2017.  03/29/2017. FINDINGS: Endotracheal tube and right IJ line in stable position. Heart size normal. Diffuse severe bilateral pulmonary interstitial prominence consistent with interstitial edema and/or pneumonitis again noted without interim change. Small left pleural effusion. Biapical pleural thickening noted consistent scarring. Surgical coils noted in the upper abdomen. IMPRESSION: 1.  Lines and tubes in stable position. 2. Diffuse severe bilateral from interstitial prominence again noted consistent with interstitial edema and/or pneumonitis. No interim change. Small left pleural effusion. Electronically Signed   By: Marcello Moores  Register   On: 04/05/2017 07:01   Dg Chest Port 1 View  Result Date: 04/04/2017 CLINICAL DATA:  Status post endotracheal intubation. EXAM: PORTABLE CHEST 1 VIEW COMPARISON:  Radiographs of Apr 02, 2017. FINDINGS: The heart size and mediastinal contours are within normal limits. Endotracheal tube is seen projected over tracheal air shadow with distal tip 7.5 cm above the carina. Right internal jugular catheter is noted with distal tip in expected position of the SVC. No pneumothorax is noted. There is interval development of diffuse reticulonodular opacities throughout both lungs most consistent with inflammation or pneumonia or possibly edema. Mild right pleural effusion is noted. The visualized skeletal structures are unremarkable. IMPRESSION: Endotracheal tube and right internal jugular venous catheter in grossly good position. Interval development of bilateral diffuse lung opacities as described above, most consistent with inflammation or pneumonia, or less likely edema. Mild right pleural effusion is noted. Electronically Signed   By: Marijo Conception, M.D.   On: 04/04/2017 11:36   [place] Guide Roadmapping  Result Date:  04/05/2017 CLINICAL DATA:  Cirrhosis with gastric varices. Acute hemorrhage and hemodynamic instability. See previous consultation by Dr. Anselm Pancoast. EXAM: 1. TRANSJUGULAR INTRAHEPATIC PORTOSYSTEMIC SHUNT 2. IR PARACENTESIS 1. ULTRASOUND GUIDANCE FOR VENOUS ACCESS x2 2. TRANSJUGLAR INTRHEPATIC PORTOSYSTEMIC SHUNT (TIPS) CREATION 3. SELECTIVE GASTRIC VARICEAL VENOGRAM 4. PERCUTANEOUS SCLEROSANT EMBOLIZATION ANESTHESIA/SEDATION: General - as administered by the Anesthesia department MEDICATIONS: Sodium Tetradecyl Sulfate 31m, lipiodol 280miv CONTRAST:  2002mSOVUE-300 IOPAMIDOL (ISOVUE-300) INJECTION 61% FLUOROSCOPY TIME:  Fluoroscopy Time: 61 min 36 sec [id number] COMPLICATIONS: None immediate. PROCEDURE: Informed written consent was obtained from the family after a thorough discussion of the procedural risks, benefits and alternatives. All questions were addressed. Maximal Sterile Barrier Technique was utilized including caps, mask, sterile gowns, sterile gloves, sterile drape, hand hygiene and skin antiseptic. A timeout was performed prior to the initiation of the procedure. The skin overlying the right upper abdominal quadrant as well as the right neck and femoral region were prepped and draped in usual sterile fashion. Initial ultrasound scanning demonstrates a small amount of perihepatic ascites ; no paracentesis was indicated. Under direct ultrasound guidance, a peripheral aspect of the right portal vein was  accessed with a 22 gauge needle. Ultrasound image was saved for procedural documentation purposes. The track was dilated with the inner 3 French catheter from the [place] set. Several contrast limited portal venograms were performed. A Nitrex wire was advanced through the 3 [place] catheter with the radiopaque transition positioned at the target entrance to the peripheral right portal vein. Next, the right internal jugular vein was accessed under direct ultrasound. Ultrasound image was saved for procedural  documentation purposes. This allowed for placement of the 10 French TIPS vascular sheath. With the aid of angiographic guidewires, an MPA catheter was utilized to select the right hepatic vein and a hepatic venogram was performed. Under fluoroscopic guidance, the right portal vein with targeted with a Rsch-Uchida TIPS needle directed at the radiopaque target within the right portal vein. Ultimately, the right portal vein was accessed at a desirable location allowing advancement of a stiff Glidewire into the main portal vein. A 4 French glide catheter was advanced over the stiff Glidewire and a portal venogram was performed. Next, as there was difficulty advancing a measuring catheter through the intrahepatic track, the track was dilated with a 4 mm Mustang balloon, allowing advancement measuring pigtail into the right portal vein. A portal venogram was performed with a measuring Omni Flush catheter. Tract further dilated with 8 mm Conquest balloon to facilitate advancement of the sheath into the main portal vein. The percutaneous portal microcatheter and wire were removed. Next, a 2 cm (un covered) x 6 cm (covered) x 10 mm GORE VIATORR TIPS Endoprosthesis was advanced through the intra portal track and deployed. The TIPS stent was angioplastied in multiple stations to 10 mm diameter. Follow-up portal venogram demonstrated good flow, no residual stenosis. There is continued retrograde collateral flow through dilated left gastric vein to supply the gastric varices, ultimately draining for retroperitoneal collaterals into the left renal vein. A 5 French angiographic catheter was advanced into the dilated left gastric vein with the aid of an Amplatz Glidewire. Under direct ultrasound guidance, the right common femoral vein was accessed with a micropuncture kit. An ultrasound image was saved for procedural documentation purposes. Over a Bentson wire, the track was dilated allowing for placement of a 10 French TIPS  sheath to the level of the mid IVC under intermittent fluoroscopic guidance. A Cobra catheter was utilized to select the left renal vein and a left renal venogram was performed. A Rosen wire was advanced into the caudal pole of the left kidney as the access sheath was advanced to the origin of left renal vein. Next, a 4 French angled glide catheter was cannulated within the sheath, next to the [place] wire, and with the use of a regular glidewire, was utilized to select the outflow origin of the hypertrophied gastric varix. The Rosen wire was then removed and over a coiled Amplatz wire, the vascular sheath was advanced into the outflow of the splenorenal shunt. Contrast injection confirmed appropriate positioning. The left gastric vein catheter was exchanged for a 100 cm Renato Battles scientific occlusion balloon. A 65 cm [place] scientific occlusion balloon was then advanced into the caudal aspect of the outflow of the splenorenal shunt. Inflation of the left gastric balloon and subsequent injection confirmed stasis of flow in the gastric varix complex. Inflation of the splenorenal shunt balloon pulled caudal to near vessel's confluence with the left renal veinwith subsequent left gastric injection confirmed stasis of flow in the gastric varix complex. Next, a Lantern micro catheter was advanced through the splenorenal occlusion balloon into  the caudal aspect of the markedly hypertrophied shunt. Venography confirmed appropriate positioning. A second Lantern microcatheter was advanced through the left gastric venous occlusion balloon into the left gastric vein. The left gastric occlusion balloon was inflated. At this time, sclerosant was administered (in a mixture of 2 cc of Lipiodol, 4 cc of STS and 6 cc of air) under fluoroscopic guidance. Multiple spot fluoroscopic and radiographic images were obtained during the sclerosant administration. Once sclerosis and was noted into the proximal aspect of the splenorenal shunt, the  inferior balloon was inflated. At this time multiple overlapping Ruby coils were deployed within the left renal vein and subsequently within the caudal aspect of the splenorenral shunt. A completion left renal venogram was performed and the procedure was terminated. Follow-up splenic venogram confirms no further flow through the gastric variceal complex. Good antegrade flow through the TIPS shunt. Continued hepatopetal antegrade perfusion through the left portal vein. All wires, catheters and sheaths were removed from the patient. Hemostasis was achieved at the right IJ and groin access sites with manual compression. A dressing was placed. The patient tolerated the procedure well. Patient transferred to the PACU. Operators:  Henn/ Hassell/ Watts IMPRESSION: 1. Successful creation of a TIPS with reduction of portosystemic gradient to 3 mmHg. 2. Successful ultrasound-guided paracentesis yielding 13 L of ascitic fluid. PLAN: - Continued in-patient management as per the providing clinical service. - Followup post-BATO abd CT  when clinically stable - Patient be seen in follow-up consultation at the interventional radiology clinic with postprocedural TIPS ultrasound and labs (CMP, INR and ammonia levels) in 4-6 weeks. Electronically Signed   By: Lucrezia Europe M.D.   On: 04/05/2017 11:43   [place] Guide Roadmapping  Result Date: 04/05/2017 CLINICAL DATA:  Cirrhosis with gastric varices. Acute hemorrhage and hemodynamic instability. See previous consultation by Dr. Anselm Pancoast. EXAM: 1. TRANSJUGULAR INTRAHEPATIC PORTOSYSTEMIC SHUNT 2. IR PARACENTESIS 1. ULTRASOUND GUIDANCE FOR VENOUS ACCESS x2 2. TRANSJUGLAR INTRHEPATIC PORTOSYSTEMIC SHUNT (TIPS) CREATION 3. SELECTIVE GASTRIC VARICEAL VENOGRAM 4. PERCUTANEOUS SCLEROSANT EMBOLIZATION ANESTHESIA/SEDATION: General - as administered by the Anesthesia department MEDICATIONS: Sodium Tetradecyl Sulfate 3m, lipiodol 261miv CONTRAST:  [id number]  IOPAMIDOL (ISOVUE-300) INJECTION 61% FLUOROSCOPY TIME:  Fluoroscopy Time: 61 min 36 sec [phone] COMPLICATIONS: None immediate. PROCEDURE: Informed written consent was obtained from the family after a thorough discussion of the procedural risks, benefits and alternatives. All questions were addressed. Maximal Sterile Barrier Technique was utilized including caps, mask, sterile gowns, sterile gloves, sterile drape, hand hygiene and skin antiseptic. A timeout was performed prior to the initiation of the procedure. The skin overlying the right upper abdominal quadrant as well as the right neck and femoral region were prepped and draped in usual sterile fashion. Initial ultrasound scanning demonstrates a small amount of perihepatic ascites ; no paracentesis was indicated. Under direct ultrasound guidance, a peripheral aspect of the right portal vein was accessed with a 22 gauge needle. Ultrasound image was saved for procedural documentation purposes. The track was dilated with the inner 3 French catheter from the [place]. Several contrast limited portal venograms were performed. A Nitrex wire was advanced through the 3 FrePakistantheter with the radiopaque transition positioned at the target entrance to the peripheral right portal vein. Next, the right internal jugular vein was accessed under direct ultrasound. Ultrasound image was saved for procedural documentation purposes. This allowed for placement of the 10 French TIPS vascular sheath. With the aid of angiographic guidewires, an  MPA catheter was utilized to select the right hepatic vein and a hepatic venogram was performed. Under fluoroscopic guidance, the right portal vein with targeted with a Rsch-Uchida TIPS needle directed at the radiopaque target within the right portal vein. Ultimately, the right portal vein was accessed at a desirable location allowing advancement of a stiff Glidewire into the main portal vein. A 4 French glide catheter was advanced  over the stiff Glidewire and a portal venogram was performed. Next, as there was difficulty advancing a measuring catheter through the intrahepatic track, the track was dilated with a 4 mm Mustang balloon, allowing advancement measuring pigtail into the right portal vein. A portal venogram was performed with a measuring Omni Flush catheter. Tract further dilated with 8 mm Conquest balloon to facilitate advancement of the sheath into the main portal vein. The percutaneous portal microcatheter and wire were removed. Next, a 2 cm (un covered) x 6 cm (covered) x 10 mm GORE VIATORR TIPS Endoprosthesis was advanced through the intra portal track and deployed. The TIPS stent was angioplastied in multiple stations to 10 mm diameter. Follow-up portal venogram demonstrated good flow, no residual stenosis. There is continued retrograde collateral flow through dilated left gastric vein to supply the gastric varices, ultimately draining for retroperitoneal collaterals into the left renal vein. A 5 French angiographic catheter was advanced into the dilated left gastric vein with the aid of an Amplatz Glidewire. Under direct ultrasound guidance, the right common femoral vein was accessed with a micropuncture kit. An ultrasound image was saved for procedural documentation purposes. Over a Bentson wire, the track was dilated allowing for placement of a 10 French TIPS sheath to the level of the mid IVC under intermittent fluoroscopic guidance. A Cobra catheter was utilized to select the left renal vein and a left renal venogram was performed. A Rosen wire was advanced into the caudal pole of the left kidney as the access sheath was advanced to the origin of left renal vein. Next, a 4 French angled glide catheter was cannulated within the sheath, next to the Madison wire, and with the use of a regular glidewire, was utilized to select the outflow origin of the hypertrophied gastric varix. The Rosen wire was then removed and over a  coiled Amplatz wire, the vascular sheath was advanced into the outflow of the splenorenal shunt. Contrast injection confirmed appropriate positioning. The left gastric vein catheter was exchanged for a 100 cm Renato Battles scientific occlusion balloon. A 65 cm Boston scientific occlusion balloon was then advanced into the caudal aspect of the outflow of the splenorenal shunt. Inflation of the left gastric balloon and subsequent injection confirmed stasis of flow in the gastric varix complex. Inflation of the splenorenal shunt balloon pulled caudal to near vessel's confluence with the left renal veinwith subsequent left gastric injection confirmed stasis of flow in the gastric varix complex. Next, a Lantern micro catheter was advanced through the splenorenal occlusion balloon into the caudal aspect of the markedly hypertrophied shunt. Venography confirmed appropriate positioning. A second Lantern microcatheter was advanced through the left gastric venous occlusion balloon into the left gastric vein. The left gastric occlusion balloon was inflated. At this time, sclerosant was administered (in a mixture of 2 cc of Lipiodol, 4 cc of STS and 6 cc of air) under fluoroscopic guidance. Multiple spot fluoroscopic and radiographic images were obtained during the sclerosant administration. Once sclerosis and was noted into the proximal aspect of the splenorenal shunt, the inferior balloon was inflated. At this time multiple  overlapping Ruby coils were deployed within the left renal vein and subsequently within the caudal aspect of the splenorenral shunt. A completion left renal venogram was performed and the procedure was terminated. Follow-up splenic venogram confirms no further flow through the gastric variceal complex. Good antegrade flow through the TIPS shunt. Continued hepatopetal antegrade perfusion through the left portal vein. All wires, catheters and sheaths were removed from the patient. Hemostasis was achieved at the  right IJ and groin access sites with manual compression. A dressing was placed. The patient tolerated the procedure well. Patient transferred to the PACU. Operators:  Henn/ Hassell/ Watts IMPRESSION: 1. Successful creation of a TIPS with reduction of portosystemic gradient to 3 mmHg. 2. Successful ultrasound-guided paracentesis yielding 13 L of ascitic fluid. PLAN: - Continued in-patient management as per the providing clinical service. - Followup post-BATO abd CT  when clinically stable - Patient be seen in follow-up consultation at the interventional radiology clinic with postprocedural TIPS ultrasound and labs (CMP, INR and ammonia levels) in 4-6 weeks. Electronically Signed   By: Lucrezia Europe M.D.   On: 04/05/2017 11:43    Patient Profile     66 y.o. male with cirrhosis and gastric varices and recurrent GI bleeding, had circulatory collapse associated with ECG changes suggestive of anterolateral ischemia, resolved after correction of hypotension and anemia using transfusion and pressors. Troponin increased to 33.  Assessment & Plan    1. CAD:  As discussed before, it is highly likely that he has CAD (probably LAD stenosis by ECG) and had severe demand ischemia with a moderate size NSTEMI. Due to active bleeding and advanced liver disease and hypotension, we cannot administer antiplatelet, satin, beta blocker or perform revascularization procedures. I would recommend a bedside echo to see if there is a significant reduction in LVEF, but this would only be to assist in understanding his hemodynamics. 2. 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

## 2017-04-05 NOTE — Transfer of Care (Signed)
Immediate Anesthesia Transfer of Care Note  Patient: John Warren  Procedure(s) Performed: Procedure(s): RADIOLOGY WITH ANESTHESIA (N/A)  Patient Location: ICU  Anesthesia Type:General  Level of Consciousness: Patient remains intubated per anesthesia plan  Airway & Oxygen Therapy: Patient remains intubated per anesthesia plan and Patient placed on Ventilator (see vital sign flow sheet for setting)  Post-op Assessment: Report given to RN and Post -op Vital signs reviewed and stable  Post vital signs: Reviewed and stable  Last Vitals:  Vitals:   04/05/17 0645 04/05/17 0700  BP:  106/60  Pulse: (!) 116 (!) 116  Resp: (!) 26 (!) 28  Temp: (!) 38 C 37.9 C    Last Pain:  Vitals:   04/05/17 0400  TempSrc: Core (Comment)  PainSc:       Patients Stated Pain Goal: 1 (95/32/02 3343)  Complications: No apparent anesthesia complications

## 2017-04-05 NOTE — Progress Notes (Signed)
Subjective: No further bleeding. Intubated, but awake and denies abdominal pain.  Objective: Vital signs in last 24 hours: Temp:  [96.4 F (35.8 C)-100.6 F (38.1 C)] 100.4 F (38 C) (05/04 1556) Pulse Rate:  [111-136] 118 (05/04 1556) Resp:  [10-30] 22 (05/04 1556) BP: (90-118)/(55-80) 113/56 (05/04 1500) SpO2:  [97 %-100 %] 100 % (05/04 1556) Arterial Line BP: (77-147)/(42-66) 109/46 (05/04 1515) FiO2 (%):  [70 %-100 %] 70 % (05/04 1556) Weight:  [103.2 kg (227 lb 8.2 oz)] 103.2 kg (227 lb 8.2 oz) (05/04 0130) Weight change: -0.405 kg (-14.3 oz) Last BM Date: 04/04/17  PE: GEN:  Intubated, but awake and can answer questions ABD:  Protuberant, non-tender  Lab Results: CBC    Component Value Date/Time   WBC 26.4 (H) 04/05/2017 0430   RBC 4.13 (L) 04/05/2017 0430   HGB 12.1 (L) 04/05/2017 1115   HCT 35.6 (L) 04/05/2017 1115   PLT 186 04/05/2017 0430   MCV 88.1 04/05/2017 0430   MCH 29.1 04/05/2017 0430   MCHC 33.0 04/05/2017 0430   RDW 17.3 (H) 04/05/2017 0430   LYMPHSABS 2.3 03/29/2017 2208   MONOABS 1.8 (H) 03/29/2017 2208   EOSABS 0.1 03/29/2017 2208   BASOSABS 0.0 03/29/2017 2208   CMP     Component Value Date/Time   NA 137 04/05/2017 0430   K 4.3 04/05/2017 0430   CL 103 04/05/2017 0430   CO2 23 04/05/2017 0430   GLUCOSE 135 (H) 04/05/2017 0430   BUN 11 04/05/2017 0430   CREATININE 1.16 04/05/2017 0430   CALCIUM 7.2 (L) 04/05/2017 0430   PROT 5.6 (L) 04/04/2017 1918   ALBUMIN 3.5 04/04/2017 1918   AST 66 (H) 04/04/2017 1918   ALT 25 04/04/2017 1918   ALKPHOS 63 04/04/2017 1918   BILITOT 3.3 (H) 04/04/2017 1918   GFRNONAA >60 04/05/2017 0430   GFRAA >60 04/05/2017 0430   Assessment:  1.  Gastric variceal bleeding, complicated by hypovolemic shock.  BATO and TIPS done. 2.  Acute blood loss anemia.  Plan:  1.  Would not put in nasoenteric feeding tube for at least the next few days, as I feel this could run risk of irritating patient's gastric  varices (and it can take a few days for the TIPS procedure to reduce down adequately patients portal HTN). 2.  Octreotide for another day or two (for same reasons as above), then would plan to stop. 3.  PPI for foreseeable future. 4.  Serial CBCs, volume repletion as needed. 5.  Hopefully patient can be intubated soon and oral diet started. 6.  [facility] will follow.   John Warren, ANCHETA 04/05/2017, 4:25 PM   Pager [phone] If no answer or after 5 PM call [phone]

## 2017-04-05 NOTE — Progress Notes (Signed)
Referring Physician(s): Outlaw,W  Supervising Physician: Aletta Edouard  Patient Status:  [facility] - In-pt  Chief Complaint:  GI bleed  Subjective: Pt intubated, awake; follows commands; girlfriend in room; no further bleeding overnight for nursing   Allergies: Penicillins  Medications: Prior to Admission medications   Medication Sig Start Date End Date Taking? Authorizing Provider  acetaminophen (TYLENOL) 500 MG tablet Take 500-1,000 mg by mouth every 8 (eight) hours as needed (for pain).   Yes Historical Provider, MD  ferrous sulfate 325 (65 FE) MG tablet Take 1 tablet (325 mg total) by mouth 3 (three) times daily with meals. Patient taking differently: Take 325 mg by mouth every evening.  07/17/15  Yes Dayna N Dunn, PA-C  folic acid (FOLVITE) 1 MG tablet Take 1 tablet (1 mg total) by mouth daily. 01/02/16  Yes Thurnell Lose, MD  furosemide (LASIX) 80 MG tablet Take 80 mg by mouth daily.   Yes Historical Provider, MD  nitroGLYCERIN (NITROSTAT) 0.4 MG SL tablet Place 0.4 mg under the tongue every 5 (five) minutes as needed for chest pain.   Yes Historical Provider, MD  potassium chloride SA (K-DUR,KLOR-CON) 20 MEQ tablet Take 20 mEq by mouth every morning.    Yes Historical Provider, MD  spironolactone (ALDACTONE) 50 MG tablet Take 1 tablet (50 mg total) by mouth daily. Patient taking differently: Take 50 mg by mouth 2 (two) times daily.  01/02/16  Yes Thurnell Lose, MD  thiamine 100 MG tablet Take 1 tablet (100 mg total) by mouth daily. 01/02/16  Yes Thurnell Lose, MD  traMADol (ULTRAM) 50 MG tablet Take 50 mg by mouth every 6 (six) hours as needed (for pain).   Yes Historical Provider, MD  lactulose (CHRONULAC) 10 GM/15ML solution Take 30 mLs (20 g total) by mouth daily. Patient not taking: Reported on 03/29/2017 01/26/16   Florencia Reasons, MD  omeprazole (PRILOSEC) 40 MG capsule Take 1 capsule (40 mg total) by mouth every evening. Patient not taking: Reported on 03/29/2017 01/02/16    Thurnell Lose, MD  ranitidine (ZANTAC) 150 MG tablet Take 150 mg by mouth 2 (two) times daily.    Historical Provider, MD  rifaximin (XIFAXAN) 550 MG TABS tablet Take 1 tablet (550 mg total) by mouth 2 (two) times daily. Patient not taking: Reported on 03/29/2017 01/26/16   Florencia Reasons, MD     Vital Signs: BP 106/61   Pulse (!) 116   Temp 100.2 F (37.9 C)   Resp (!) 22   Ht 5\' 11"  (1.803 m)   Wt 227 lb 8.2 oz (103.2 kg)   SpO2 99%   BMI 31.73 kg/m   Physical Exam intubated, right IJ line intact, site okay, chest with scattered crackles, heart with tachycardic but regular rhythm, abdomen sl distended, positive bowel sounds,NT  Imaging: Dg Chest 2 View  Result Date: 04/02/2017 CLINICAL DATA:  Internal bleeding of unknown etiology. Onset of left-sided chest pain with hypoxia and cough over the past 3 days. EXAM: CHEST  2 VIEW COMPARISON:  Portable chest x-ray of April 01, 2017 FINDINGS: The lungs are mildly hyperinflated with hemidiaphragm flattening. The interstitial markings remain increased but have improved since yesterday's study. There is no focal infiltrate. There is blunting of the posterior costophrenic angles bilaterally. The heart and pulmonary vascularity are normal. The mediastinum is normal in width. There is calcification in the wall of the aortic arch. The bony thorax exhibits no acute abnormality. IMPRESSION: COPD. No CHF. Minimal basilar atelectasis  and tiny bilateral pleural effusions layering posteriorly. Electronically Signed   By: David  Martinique M.D.   On: 04/02/2017 08:00   Dg Chest Port 1 View  Result Date: 04/05/2017 CLINICAL DATA:  Respiratory failure. EXAM: PORTABLE CHEST 1 VIEW COMPARISON:  04/04/2017.  03/29/2017. FINDINGS: Endotracheal tube and right IJ line in stable position. Heart size normal. Diffuse severe bilateral pulmonary interstitial prominence consistent with interstitial edema and/or pneumonitis again noted without interim change. Small left pleural  effusion. Biapical pleural thickening noted consistent scarring. Surgical coils noted in the upper abdomen. IMPRESSION: 1.  Lines and tubes in stable position. 2. Diffuse severe bilateral from interstitial prominence again noted consistent with interstitial edema and/or pneumonitis. No interim change. Small left pleural effusion. Electronically Signed   By: Marcello Moores  Register   On: 04/05/2017 07:01   Dg Chest Port 1 View  Result Date: 04/04/2017 CLINICAL DATA:  Status post endotracheal intubation. EXAM: PORTABLE CHEST 1 VIEW COMPARISON:  Radiographs of Apr 02, 2017. FINDINGS: The heart size and mediastinal contours are within normal limits. Endotracheal tube is seen projected over tracheal air shadow with distal tip 7.5 cm above the carina. Right internal jugular catheter is noted with distal tip in expected position of the SVC. No pneumothorax is noted. There is interval development of diffuse reticulonodular opacities throughout both lungs most consistent with inflammation or pneumonia or possibly edema. Mild right pleural effusion is noted. The visualized skeletal structures are unremarkable. IMPRESSION: Endotracheal tube and right internal jugular venous catheter in grossly good position. Interval development of bilateral diffuse lung opacities as described above, most consistent with inflammation or pneumonia, or less likely edema. Mild right pleural effusion is noted. Electronically Signed   By: Marijo Conception, M.D.   On: 04/04/2017 11:36    Labs:  CBC:  Recent Labs  04/03/17 0525 04/04/17 0300  04/04/17 1530 04/04/17 1609 04/04/17 1918 04/04/17 2348 04/05/17 0430  WBC 6.6 12.2*  --   --   --   --  24.2* 26.4*  HGB 8.7* 8.3*  < >  --  11.2* 11.7* 11.7* 12.0*  HCT 26.1* 24.7*  < >  --  33.0* 35.3* 35.8* 36.4*  PLT 83* 118*  --  181  --   --  171 186  < > = values in this interval not displayed.  COAGS:  Recent Labs  03/29/17 1855 03/31/17 0352 04/04/17 1530  INR 1.34 1.32 1.49     BMP:  Recent Labs  04/03/17 0525 04/04/17 0300 04/04/17 1011 04/04/17 1509 04/04/17 1609 04/04/17 1918 04/05/17 0430  NA 135 135 135 139 140 137 137  K 3.7 4.8 4.2 3.8 3.7 3.8 4.3  CL 104 103 103  --   --  102 103  CO2 24 22  --   --   --  25 23  GLUCOSE 113* 136* 128*  --   --  192* 135*  BUN 9 9 9   --   --  9 11  CALCIUM 7.6* 7.9*  --   --   --  7.2* 7.2*  CREATININE 0.85 0.94 0.80  --   --  0.89 1.16  GFRNONAA >60 >60  --   --   --  >60 >60  GFRAA >60 >60  --   --   --  >60 >60    LIVER FUNCTION TESTS:  Recent Labs  03/29/17 1634 03/30/17 0228 04/01/17 0301 04/04/17 1918  BILITOT 1.3* 1.1 0.7 3.3*  AST 32 33 26 66*  ALT 27 23 18 25   ALKPHOS 86 70 52 63  PROT 6.0* 5.0* 4.2* 5.6*  ALBUMIN 3.4* 3.0* 2.3* 3.5    Assessment and Plan: Pt with history of cirrhosis, portal hypertension, recent GI bleed; status post TIPS and BATO of gastric varices on 5/3. Current count 100.2, chest x-ray today with persistent interstitial edema, troponins elevated from demand ischemia? cardiogenic shock as well; ammonia 80, WBC 26.4 (24.2), hgb stable at 12, creatinine normal, lactic acid 3.1; blood cultures negative; currently remains on pressors; continue lactulose; monitor closely for persistent bleeding; check follow-up CT (BRTO protocol) during this hospitalization and TIPS ultrasound in 4-6 weeks.   Electronically Signed: D. Rowe Robert 04/05/2017, 11:18 AM   I spent a total of 20 minutes at the the patient's bedside AND on the patient's hospital floor or unit, greater than 50% of which was counseling/coordinating care for TIPS/BATO    Patient ID: John Warren, male   DOB: 1951-06-17, 66 y.o.   MRN: [id number]

## 2017-04-05 NOTE — Progress Notes (Signed)
CRITICAL VALUE ALERT  Critical value received:  Troponin 3.85  Date of notification:  04/04/2017  Time of notification:  2043  Critical value read back:Yes.    Nurse who received alert:  Lonn Georgia, RN  MD notified (1st page):  [name]  Time of first page:  20:43  MD notified (2nd page):  Time of second page:  Responding MD:  Oletta Darter  Time MD responded:  21:03

## 2017-04-06 ENCOUNTER — Encounter (HOSPITAL_COMMUNITY): Payer: Self-pay | Admitting: *Deleted

## 2017-04-06 DIAGNOSIS — I5021 Acute systolic (congestive) heart failure: Secondary | ICD-10-CM

## 2017-04-06 DIAGNOSIS — J189 Pneumonia, unspecified organism: Secondary | ICD-10-CM | POA: Diagnosis not present

## 2017-04-06 DIAGNOSIS — Y95 Nosocomial condition: Secondary | ICD-10-CM

## 2017-04-06 DIAGNOSIS — I214 Non-ST elevation (NSTEMI) myocardial infarction: Secondary | ICD-10-CM

## 2017-04-06 LAB — BASIC METABOLIC PANEL
Anion gap: 9 (ref 5–15)
Anion gap: 7 (ref 5–15)
BUN: 11 mg/dL (ref 6–20)
BUN: 10 mg/dL (ref 6–20)
CHLORIDE: 103 mmol/L (ref 101–111)
CHLORIDE: 106 mmol/L (ref 101–111)
CO2: 26 mmol/L (ref 22–32)
CO2: 25 mmol/L (ref 22–32)
CREATININE: 0.96 mg/dL (ref 0.61–1.24)
CREATININE: 0.88 mg/dL (ref 0.61–1.24)
Calcium: 7.4 mg/dL — ABNORMAL LOW (ref 8.9–10.3)
Calcium: 7.5 mg/dL — ABNORMAL LOW (ref 8.9–10.3)
GFR calc Af Amer: >60 mL/min (ref >60)
GFR calc non Af Amer: >60 mL/min (ref >60)
Glucose, Bld: 130 mg/dL — ABNORMAL HIGH (ref 65–99)
Glucose, Bld: 120 mg/dL — ABNORMAL HIGH (ref 65–99)
POTASSIUM: 3.5 mmol/L (ref 3.5–5.1)
Potassium: 3.3 mmol/L — ABNORMAL LOW (ref 3.5–5.1)
SODIUM: 138 mmol/L (ref 135–145)
SODIUM: 138 mmol/L (ref 135–145)

## 2017-04-06 LAB — GLUCOSE, CAPILLARY
GLUCOSE-CAPILLARY: 150 mg/dL — AB (ref 65–99)
GLUCOSE-CAPILLARY: 105 mg/dL — AB (ref 65–99)
GLUCOSE-CAPILLARY: 113 mg/dL — AB (ref 65–99)
Glucose-Capillary: 112 mg/dL — ABNORMAL HIGH (ref 65–99)
Glucose-Capillary: 140 mg/dL — ABNORMAL HIGH (ref 65–99)
Glucose-Capillary: 98 mg/dL (ref 65–99)

## 2017-04-06 LAB — CBC
HCT: 34.7 % — ABNORMAL LOW (ref 39.0–52.0)
HEMOGLOBIN: 10.9 g/dL — AB (ref 13.0–17.0)
MCH: 28.4 pg (ref 26.0–34.0)
MCHC: 31.4 g/dL (ref 30.0–36.0)
MCV: 90.4 fL (ref 78.0–100.0)
Platelets: 130 10*3/uL — ABNORMAL LOW (ref 150–400)
RBC: 3.84 MIL/uL — ABNORMAL LOW (ref 4.22–5.81)
RDW: 17.6 % — ABNORMAL HIGH (ref 11.5–15.5)
WBC: 21.1 10*3/uL — ABNORMAL HIGH (ref 4.0–10.5)

## 2017-04-06 LAB — PROCALCITONIN: Procalcitonin: 1.66 ng/mL

## 2017-04-06 LAB — POCT I-STAT 3, ART BLOOD GAS (G3+)
Acid-Base Excess: 2 mmol/L (ref 0.0–2.0)
BICARBONATE: 26.2 mmol/L (ref 20.0–28.0)
Bicarbonate: 28.4 mmol/L — ABNORMAL HIGH (ref 20.0–28.0)
O2 Saturation: 99 %
O2 Saturation: 98 %
PCO2 ART: 54 mmHg — AB (ref 32.0–48.0)
PCO2 ART: 48.1 mmHg — AB (ref 32.0–48.0)
PH ART: 7.346 — AB (ref 7.350–7.450)
PO2 ART: 104 mmHg (ref 83.0–108.0)
Patient temperature: 37.5
TCO2: 30 mmol/L (ref 0–100)
TCO2: 28 mmol/L (ref 0–100)
pH, Arterial: 7.331 — ABNORMAL LOW (ref 7.350–7.450)
pO2, Arterial: 155 mmHg — ABNORMAL HIGH (ref 83.0–108.0)

## 2017-04-06 LAB — HEMOGLOBIN AND HEMATOCRIT, BLOOD
HCT: 33.2 % — ABNORMAL LOW (ref 39.0–52.0)
HCT: 32.1 % — ABNORMAL LOW (ref 39.0–52.0)
HEMATOCRIT: 35.4 % — AB (ref 39.0–52.0)
HEMOGLOBIN: 10.4 g/dL — AB (ref 13.0–17.0)
Hemoglobin: 11.4 g/dL — ABNORMAL LOW (ref 13.0–17.0)
Hemoglobin: 10.5 g/dL — ABNORMAL LOW (ref 13.0–17.0)

## 2017-04-06 LAB — PHOSPHORUS: PHOSPHORUS: 2.1 mg/dL — AB (ref 2.5–4.6)

## 2017-04-06 LAB — MAGNESIUM: MAGNESIUM: 1.9 mg/dL (ref 1.7–2.4)

## 2017-04-06 MED ORDER — DIPHENHYDRAMINE HCL 50 MG/ML IJ SOLN
12.5000 mg | Freq: Four times a day (QID) | INTRAMUSCULAR | Status: DC | PRN
  Administered 2017-04-06 – 2017-04-07 (×4): 12.5 mg via INTRAVENOUS
  Filled 2017-04-06 (×4): qty 1

## 2017-04-06 MED ORDER — FUROSEMIDE 10 MG/ML IJ SOLN
40.0000 mg | Freq: Three times a day (TID) | INTRAMUSCULAR | Status: DC
  Administered 2017-04-06 – 2017-04-09 (×9): 40 mg via INTRAVENOUS
  Filled 2017-04-06 (×9): qty 4

## 2017-04-06 MED ORDER — ACETAMINOPHEN 325 MG PO TABS: 325.0000 mg | ORAL_TABLET | Freq: Once | ORAL | Status: DC

## 2017-04-06 MED ORDER — POTASSIUM CHLORIDE 10 MEQ/50ML IV SOLN
10.0000 meq | INTRAVENOUS | Status: AC
Start: 2017-04-06 — End: 2017-04-06
  Administered 2017-04-06 (×2): 10 meq via INTRAVENOUS
  Filled 2017-04-06 (×2): qty 50

## 2017-04-06 NOTE — Progress Notes (Signed)
Referring Physician(s): Outlaw,W  Supervising Physician: Aletta Edouard  Patient Status:  [facility] - In-pt  Chief Complaint:  GI bleed  Subjective: Pt remainsintubated, girlfriend in room; no further bleeding overnight for nursing   Allergies: Penicillins  Medications:  Current Facility-Administered Medications:  .  0.9 %  sodium chloride infusion, , Intravenous, Continuous, Lucious Groves, DO, Last Rate: 10 mL/hr at 04/06/17 0800 .  0.9 %  sodium chloride infusion, , Intravenous, Continuous, Raylene Miyamoto, MD, Last Rate: 10 mL/hr at 04/06/17 0800 .  acetaminophen (TYLENOL) tablet 650 mg, 650 mg, Oral, Q6H PRN, 650 mg at 04/03/17 1737 **OR** acetaminophen (TYLENOL) suppository 650 mg, 650 mg, Rectal, Q6H PRN, Rice, Resa Miner, MD .  budesonide (PULMICORT) nebulizer solution 0.5 mg, 0.5 mg, Nebulization, BID, de Dios, [place] A, MD, 0.5 mg at 04/06/17 0836 .  ceFEPIme (MAXIPIME) 1 g in dextrose 5 % 50 mL IVPB, 1 g, Intravenous, Q8H, de Dios, Mike Gip, MD, Stopped at 04/06/17 [phone] .  chlorhexidine gluconate (MEDLINE KIT) (PERIDEX) 0.12 % solution 15 mL, 15 mL, Mouth Rinse, BID, de Dios, Courtland A, MD, 15 mL at 04/06/17 0749 .  Chlorhexidine Gluconate Cloth 2 % PADS 6 each, 6 each, Topical, Daily, Raylene Miyamoto, MD, 6 each at 04/05/17 1644 .  fentaNYL (SUBLIMAZE) bolus via infusion 25 mcg, 25 mcg, Intravenous, Q1H PRN, Jennelle Human B, NP, 25 mcg at 04/05/17 2055 .  fentaNYL 2532mg in NS 2578m(1010mml) infusion-PREMIX, 25-400 mcg/hr, Intravenous, Continuous, Simpson, Paula B, NP, Last Rate: 20 mL/hr at 04/06/17 0800, 200 mcg/hr at 04/06/17 0800 .  folic acid injection 1 mg, 1 mg, Intravenous, Daily, Desai, Rahul P, PA-C, 1 mg at 04/06/17 0929381 furosemide (LASIX) injection 40 mg, 40 mg, Intravenous, Q8H, Hilty, Kenneth C, MD .  ipratropium-albuterol (DUONEB) 0.5-2.5 (3) MG/3ML nebulizer solution 3 mL, 3 mL, Nebulization, Q6H, Simpson, Paula B, NP, 3 mL  at 04/06/17 0836 .  lactulose (CHRONULAC) enema 200 gm, 300 mL, Rectal, BID, de Dios, JosMontalvin Manor MD, 300 mL at 04/06/17 0749 .  MEDLINE mouth rinse, 15 mL, Mouth Rinse, QID, de Dios, JosTillatoba MD, 15 mL at 04/06/17 0352 .  metroNIDAZOLE (FLAGYL) IVPB 500 mg, 500 mg, Intravenous, Q8H, de Dios, JosGraceville MD, Last Rate: 100 mL/hr at 04/06/17 0928, 500 mg at 04/06/17 0928 .  midazolam (VERSED) injection 1 mg, 1 mg, Intravenous, Q15 min PRN, SimJennelle Human NP .  midazolam (VERSED) injection 1 mg, 1 mg, Intravenous, Q2H PRN, SimJennelle Human NP .  norepinephrine (LEVOPHED) 16 mg in dextrose 5 % 250 mL (0.064 mg/mL) infusion, 0-40 mcg/min, Intravenous, Titrated, VinEvette DoffingnMallie MusselD, Last Rate: 14.1 mL/hr at 04/06/17 0800, 15 mcg/min at 04/06/17 0800 .  [COMPLETED] octreotide (SANDOSTATIN) 2 mcg/mL load via infusion 50 mcg, 50 mcg, Intravenous, Once, 50 mcg at 04/04/17 1300 **AND** octreotide (SANDOSTATIN) 500 mcg in sodium chloride 0.9 % 250 mL (2 mcg/mL) infusion, 50 mcg/hr, Intravenous, Continuous, Outlaw, Sovereign, MD, Last Rate: 25 mL/hr at 04/06/17 0800, 50 mcg/hr at 04/06/17 0800 .  pantoprazole (PROTONIX) injection 40 mg, 40 mg, Intravenous, Q12H, HofLucious GrovesO, 40 mg at 04/06/17 0926 .  phenylephrine (NEO-SYNEPHRINE) 80 mg in sodium chloride 0.9 % 500 mL (0.16 mg/mL) infusion, 0-400 mcg/min, Intravenous, Titrated, VinAxel FillerD, Last Rate: 112.5 mL/hr at 04/06/17 0800, 300 mcg/min at 04/06/17 0800 .  potassium chloride 10 mEq in 50 mL *CENTRAL LINE* IVPB, 10  mEq, Intravenous, Q1 Hr x 2, de Dios, Shelby A, MD, Last Rate: 50 mL/hr at 04/06/17 0926, 10 mEq at 04/06/17 0926 .  rifaximin (XIFAXAN) tablet 550 mg, 550 mg, Per Tube, BID, Desai, Rahul P, PA-C .  rocuronium (ZEMURON) injection 50 mg, 50 mg, Intravenous, Once, Desai, Rahul P, PA-C .  sodium chloride flush (NS) 0.9 % injection 10-40 mL, 10-40 mL, Intracatheter, Q12H, Raylene Miyamoto, MD, 10 mL at  04/05/17 2051 .  sodium chloride flush (NS) 0.9 % injection 10-40 mL, 10-40 mL, Intracatheter, PRN, Raylene Miyamoto, MD .  thiamine (B-1) injection 100 mg, 100 mg, Intravenous, Daily, Desai, Rahul P, PA-C, 100 mg at 04/06/17 0926 .  [COMPLETED] vancomycin (VANCOCIN) 1,000 mg in sodium chloride 0.9 % 100 mL IVPB, 1,000 mg, Intravenous, Once, Stopped at 04/05/17 1206 **FOLLOWED BY** vancomycin (VANCOCIN) 1,000 mg in sodium chloride 0.9 % 100 mL IVPB, 1,000 mg, Intravenous, Q12H, Priscella Mann, RPH, Stopped at 04/06/17 0300    Vital Signs: BP (!) 99/53   Pulse (!) 107   Temp 99.5 F (37.5 C)   Resp (!) 24   Ht _0  (1.803 m)   Wt 231 lb 0.7 oz (104.8 kg)   SpO2 100%   BMI 32.22 kg/m   Physical Exam intubated, right IJ line intact, site okay, chest with scattered crackles, heart with tachycardic but regular rhythm, abdomen sl distended, positive bowel sounds,NT  Imaging:  IR BATO and TIPS  Result Date: 04/05/2017 CLINICAL DATA:  Cirrhosis with gastric varices. Acute hemorrhage and hemodynamic instability. See previous consultation by Dr. Anselm Pancoast. EXAM: 1. TRANSJUGULAR INTRAHEPATIC PORTOSYSTEMIC SHUNT 2. IR PARACENTESIS 1. ULTRASOUND GUIDANCE FOR VENOUS ACCESS x2 2. TRANSJUGLAR INTRHEPATIC PORTOSYSTEMIC SHUNT (TIPS) CREATION 3. SELECTIVE GASTRIC VARICEAL VENOGRAM 4. PERCUTANEOUS SCLEROSANT EMBOLIZATION ANESTHESIA/SEDATION: General - as administered by the Anesthesia department MEDICATIONS: Sodium Tetradecyl Sulfate 31m, lipiodol 269miv CONTRAST:  20029mSOVUE-300 IOPAMIDOL (ISOVUE-300) INJECTION 61% FLUOROSCOPY TIME:  Fluoroscopy Time: 61 min 36 sec [id number] COMPLICATIONS: None immediate. PROCEDURE: Informed written consent was obtained from the family after a thorough discussion of the procedural risks, benefits and alternatives. All questions were addressed. Maximal Sterile Barrier Technique was utilized including caps, mask, sterile gowns, sterile gloves, sterile drape, hand hygiene and  skin antiseptic. A timeout was performed prior to the initiation of the procedure. The skin overlying the right upper abdominal quadrant as well as the right neck and femoral region were prepped and draped in usual sterile fashion. Initial ultrasound scanning demonstrates a small amount of perihepatic ascites ; no paracentesis was indicated. Under direct ultrasound guidance, a peripheral aspect of the right portal vein was accessed with a 22 gauge needle. Ultrasound image was saved for procedural documentation purposes. The track was dilated with the inner 3 French catheter from the AccManilat. Several contrast limited portal venograms were performed. A Nitrex wire was advanced through the 3 FrePakistantheter with the radiopaque transition positioned at the target entrance to the peripheral right portal vein. Next, the right internal jugular vein was accessed under direct ultrasound. Ultrasound image was saved for procedural documentation purposes. This allowed for placement of the 10 French TIPS vascular sheath. With the aid of angiographic guidewires, an MPA catheter was utilized to select the right hepatic vein and a hepatic venogram was performed. Under fluoroscopic guidance, the right portal vein with targeted with a Rsch-Uchida TIPS needle directed at the radiopaque target within the right portal vein. Ultimately, the right portal vein was accessed at a  desirable location allowing advancement of a stiff Glidewire into the main portal vein. A 4 French glide catheter was advanced over the stiff Glidewire and a portal venogram was performed. Next, as there was difficulty advancing a measuring catheter through the intrahepatic track, the track was dilated with a 4 mm Mustang balloon, allowing advancement measuring pigtail into the right portal vein. A portal venogram was performed with a measuring Omni Flush catheter. Tract further dilated with 8 mm Conquest balloon to facilitate advancement of the sheath into  the main portal vein. The percutaneous portal microcatheter and wire were removed. Next, a 2 cm (un covered) x 6 cm (covered) x 10 mm GORE VIATORR TIPS Endoprosthesis was advanced through the intra portal track and deployed. The TIPS stent was angioplastied in multiple stations to 10 mm diameter. Follow-up portal venogram demonstrated good flow, no residual stenosis. There is continued retrograde collateral flow through dilated left gastric vein to supply the gastric varices, ultimately draining for retroperitoneal collaterals into the left renal vein. A 5 French angiographic catheter was advanced into the dilated left gastric vein with the aid of an Amplatz Glidewire. Under direct ultrasound guidance, the right common femoral vein was accessed with a micropuncture kit. An ultrasound image was saved for procedural documentation purposes. Over a Bentson wire, the track was dilated allowing for placement of a 10 French TIPS sheath to the level of the mid IVC under intermittent fluoroscopic guidance. A Cobra catheter was utilized to select the left renal vein and a left renal venogram was performed. A Rosen wire was advanced into the caudal pole of the left kidney as the access sheath was advanced to the origin of left renal vein. Next, a 4 French angled glide catheter was cannulated within the sheath, next to the Percy wire, and with the use of a regular glidewire, was utilized to select the outflow origin of the hypertrophied gastric varix. The Rosen wire was then removed and over a coiled Amplatz wire, the vascular sheath was advanced into the outflow of the splenorenal shunt. Contrast injection confirmed appropriate positioning. The left gastric vein catheter was exchanged for a 100 cm Renato Battles scientific occlusion balloon. A 65 cm Boston scientific occlusion balloon was then advanced into the caudal aspect of the outflow of the splenorenal shunt. Inflation of the left gastric balloon and subsequent injection  confirmed stasis of flow in the gastric varix complex. Inflation of the splenorenal shunt balloon pulled caudal to near vessel's confluence with the left renal veinwith subsequent left gastric injection confirmed stasis of flow in the gastric varix complex. Next, a Lantern micro catheter was advanced through the splenorenal occlusion balloon into the caudal aspect of the markedly hypertrophied shunt. Venography confirmed appropriate positioning. A second Lantern microcatheter was advanced through the left gastric venous occlusion balloon into the left gastric vein. The left gastric occlusion balloon was inflated. At this time, sclerosant was administered (in a mixture of 2 cc of Lipiodol, 4 cc of STS and 6 cc of air) under fluoroscopic guidance. Multiple spot fluoroscopic and radiographic images were obtained during the sclerosant administration. Once sclerosis and was noted into the proximal aspect of the splenorenal shunt, the inferior balloon was inflated. At this time multiple overlapping Ruby coils were deployed within the left renal vein and subsequently within the caudal aspect of the splenorenral shunt. A completion left renal venogram was performed and the procedure was terminated. Follow-up splenic venogram confirms no further flow through the gastric variceal complex. Good antegrade flow through the  TIPS shunt. Continued hepatopetal antegrade perfusion through the left portal vein. All wires, catheters and sheaths were removed from the patient. Hemostasis was achieved at the right IJ and groin access sites with manual compression. A dressing was placed. The patient tolerated the procedure well. Patient transferred to the PACU. Operators:  Henn/ Hassell/ Watts IMPRESSION: 1. Successful creation of a TIPS with reduction of portosystemic gradient to 3 mmHg. 2. Successful ultrasound-guided paracentesis yielding 13 L of ascitic fluid. PLAN: - Continued in-patient management as per the providing clinical  service. - Followup post-BATO abd CT  when clinically stable - Patient be seen in follow-up consultation at the interventional radiology clinic with postprocedural TIPS ultrasound and labs (CMP, INR and ammonia levels) in 4-6 weeks. Electronically Signed   By: Lucrezia Europe M.D.   On: 04/05/2017 11:43    Labs:  CBC:  Recent Labs  04/04/17 0300  04/04/17 1530  04/04/17 2348 04/05/17 0430 04/05/17 1115 04/05/17 1723 04/06/17 0000 04/06/17 0346  WBC 12.2*  --   --   --  24.2* 26.4*  --   --   --  21.1*  HGB 8.3*  < >  --   < > 11.7* 12.0* 12.1* 11.7* 11.4* 10.9*  HCT 24.7*  < >  --   < > 35.8* 36.4* 35.6* 35.2* 35.4* 34.7*  PLT 118*  --  181  --  171 186  --   --   --  130*  < > = values in this interval not displayed.  COAGS:  Recent Labs  03/29/17 1855 03/31/17 0352 04/04/17 1530  INR 1.34 1.32 1.49    BMP:  Recent Labs  04/04/17 1918 04/05/17 0430 04/05/17 1608 04/06/17 0346  NA 137 137 136 138  K 3.8 4.3 4.3 3.5  CL 102 103 103 103  CO2 _0 GLUCOSE 192* 135* 156* 130*  BUN _1 CALCIUM 7.2* 7.2* 7.3* 7.4*  CREATININE 0.89 1.16 1.08 0.96  GFRNONAA >60 >60 >60 >60  GFRAA >60 >60 >60 >60    LIVER FUNCTION TESTS:  Recent Labs  03/29/17 1634 03/30/17 0228 04/01/17 0301 04/04/17 1918  BILITOT 1.3* 1.1 0.7 3.3*  AST 32 33 26 66*  ALT _2 ALKPHOS 86 70 52 63  PROT 6.0* 5.0* 4.2* 5.6*  ALBUMIN 3.4* 3.0* 2.3* 3.5    Assessment and Plan: Pt with history of cirrhosis, portal hypertension, recent GI bleed; status post TIPS and BATO of gastric varices on 5/3.  No further bleeding at this point. WBC trending back down Trying to wean pressors. IR cont to follow  Electronically Signed: Ascencion Dike 04/06/2017, 10:18 AM   I spent a total of 20 minutes at the the patient's bedside AND on the patient's hospital floor or unit, greater than 50% of which was counseling/coordinating care for TIPS/BATO

## 2017-04-06 NOTE — Progress Notes (Signed)
[facility] Progress Note Patient Name: John Warren DOB: 1951/08/05 MRN: [id number]   Date of Service  04/06/2017  HPI/Events of Note  Itching.  eICU Interventions  Will order: 1. Benadryl 12.5 mg IV Q 6 hours PRN itching.     Intervention Category Intermediate Interventions: Other:  Lysle Dingwall 04/06/2017, 5:54 PM

## 2017-04-06 NOTE — Progress Notes (Signed)
PULMONARY / CRITICAL CARE MEDICINE   Name: John Warren MRN: [id number] DOB: [birth year]    ADMISSION DATE:  03/29/2017 CONSULTATION DATE:  04/04/17  REFERRING MD:  Evette Doffing  CHIEF COMPLAINT:  GI Bleed  HISTORY OF PRESENT ILLNESS: Pt is encephelopathic; therefore, this HPI is obtained from chart review. John Warren is a 66 y.o. male with PMH as outlined below including but not limited to EtOH cirrhosis, portal gastropathy, and prior GI bleeds (3 admissions since Jan 2017, previously due to AVM and sigmoid diverticulosis). He was admitted 03/30/17 with recurrent GI bleed that started the day prior.  He was in his usual state of health then had a dark tarry BM followed by BRBPR a few hours later.  He had not been on any NSAID's, antiplatelets, anticoagulants.  He supposedly had quit drinking 1 year ago.  He was admitted and was given PRBC's and FFP and also started on PPI and octreotide.  He had tagged bleeding scan that demonstrated active bleed but it was difficult to localize the source of bleeding. 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  Based on CT findings as well as EGD and colonoscopy (see below), BRTO was recommended and planned for 04/05/17.  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  Capsule endoscopy performed 04/03/17 and demonstrated small non-bleeding AVM in mid small bowel and one non-specific erosion in the small bowel, no evidence of blood in the small bowel or colon (though poor visualization).  Colonoscopy was performed 04/01/17 which demonstrated large amount of fresh and old blood along with clots in the entire colon along with diverticulosis in the sigmoid and descending colon without active bleeding, non bleeding  internal hemorrhoids.  Source of bleeding felt to be gastric varices.  Following colonoscopy (which was performed under MAC anesthesia), pt had worsening hypotension with SBP as low as 60's.  Anesthesia administered 3 bags of albumin (12.5g) and started phenylephrine infusion.  He was also given 3u PRBC's.  CVL and A-line were placed and PCCM was called for further management.  BP responded well to albumin, neo, PRBC's (SBP currently 130's).  Neo was dropped to 58mg and pt was transferred to ICU.  After transfer to ICU, he decompensated further and required intubation due to respiratory insufficiency.    SUBJECTIVE:   No major issues overnight. Still on a lot of pressors but lower dose Hb mildly decreased.  (+) BM with lactulose >> old blood On the vent Fever pattern better   VITAL SIGNS: BP (!) 106/58   Pulse 100   Temp 99.3 F (37.4 C)   Resp (!) 24   Ht 5' 11"  (1.803 m)   Wt 104.8 kg (231 lb 0.7 oz)   SpO2 100%   BMI 32.22 kg/m   HEMODYNAMICS:    VENTILATOR SETTINGS: Vent Mode: PCV FiO2 (%):  [70 %-90 %] 70 % Set Rate:  [24 bmp] 24 bmp PEEP:  [10 cmH20-14 cmH20] 10 cmH20 Plateau Pressure:  [16 cmH20-22 cmH20] 19 cmH20  INTAKE / OUTPUT: I/O last 3 completed shifts: In: 8201.4 [I.V.:6381.4; Other:920; IV [id number]:086]Out: 45784[Urine:4010; Stool:400]   PHYSICAL EXAMINATION: General: Chronically ill appearing male, comfortable.  Neuro: [place]/AT. Sedated. HEENT: [place]/AT.  MMM. Cardiovascular: RRR, no M/R/G. Lungs: Coarse crackles throughout. Abdomen: BS x 4, S/NT/ND. Musculoskeletal: No  gross deformities.  2+ edema. L > R (chronic) Skin: Warm, dry.  LABS:  BMET  Recent Labs Lab 04/05/17 0430 04/05/17 1608 04/06/17 0346  NA 137 136 138  K 4.3 4.3 3.5  CL 103 103 103  CO2 23 25 26   BUN 11 11 11   CREATININE 1.16 1.08 0.96  GLUCOSE 135* 156* 130*    Electrolytes  Recent Labs Lab 04/04/17 0300  04/04/17 2348 04/05/17 0430 04/05/17 1608  04/06/17 0346  CALCIUM 7.9*  < >  --  7.2* 7.3* 7.4*  MG 1.8  --   --  1.5*  --  1.9  PHOS  --   --  5.5* 4.3  --  2.1*  < > = values in this interval not displayed.  CBC  Recent Labs Lab 04/04/17 2348 04/05/17 0430  04/05/17 1723 04/06/17 0000 04/06/17 0346  WBC 24.2* 26.4*  --   --   --  21.1*  HGB 11.7* 12.0*  < > 11.7* 11.4* 10.9*  HCT 35.8* 36.4*  < > 35.2* 35.4* 34.7*  PLT 171 186  --   --   --  130*  < > = values in this interval not displayed.  Coag's  Recent Labs Lab 03/31/17 0352 04/04/17 1530  INR 1.32 1.49    Sepsis Markers  Recent Labs Lab 04/04/17 2358 04/05/17 0923 04/06/17 0346  LATICACIDVEN 2.9* 3.1*  --   PROCALCITON  --  1.31 1.66    ABG  Recent Labs Lab 04/05/17 0002 04/05/17 0440 04/06/17 0347  PHART 7.310* 7.322* 7.331*  PCO2ART 49.9* 45.8 54.0*  PO2ART 85.0 80.0* 155.0*    Liver Enzymes  Recent Labs Lab 04/01/17 0301 04/04/17 1918  AST 26 66*  ALT 18 25  ALKPHOS 52 63  BILITOT 0.7 3.3*  ALBUMIN 2.3* 3.5    Cardiac Enzymes  Recent Labs Lab 04/04/17 1918 04/04/17 2348 04/05/17 0923  TROPONINI 3.85* 15.43* 33.25*    Glucose  Recent Labs Lab 04/04/17 1928 04/05/17 0721 04/05/17 1119 04/05/17 1930 04/06/17 0002 04/06/17 0737  GLUCAP 170* 132* 151* 123* 140* 150*    Imaging No results found.   STUDIES:  EGD 4/28 >  type 1 isolated gastric varices without bleeding, single non-bleeding angiodysplastic lesion in the stomach, normal duodenum. Capsule endoscopy 5/2 > small non-bleeding AVM in mid small bowel and one non-specific erosion in the small bowel, no evidence of blood in the small bowel or colon (though poor visualization). Colonoscopy 4/30 > large amount of fresh and old blood along with clots in the entire colon along with diverticulosis in the sigmoid and descending colon without active bleeding, non bleeding internal hemorrhoids.  Source of bleeding felt to be gastric varices.  CULTURES: Blood  4/28 > neg MRSA (-) Trache 5/4 >  ANTIBIOTICS: Vanc 5/4 >  Cefepime 5/4 >  Flagyl 5/4 >    SIGNIFICANT EVENTS: 4/28 > admit 5/3 > decompensation following colonoscopy > transferred to ICU > intubated. Shock.   LINES/TUBES: ETT 5/3 >  R IJ CVL 5/3 >  R radial a line 5/3 >   DISCUSSION: 66 y.o. male with hx EtOH cirrhosis and portal HTN as well as previous GI bleed (3 admissions since Jan 2017).  Admitted 4/28 with GI bleed.  EGD showed gastric varices without bleeding, capsule endo showed small non bleeding AVM in small bowel, colonoscopy showed large amount of fresh and old blood throughout entire colon along with diverticulosis. After colonoscopy, he had sudden drop in BP; therefore, received  aggressive IVF's and PRBC's.  He was then transferred to ICU where he required intubation for respiratory insufficiency. Required massive transfusion and pressors. Febrile, inc WBC and on abx.    ASSESSMENT / PLAN:  PULMONARY A: Acute Hypoxemic resp failure 2/2 acute pulm edema with volume resuscitation with GI bleed + Demand ischemia + TRALI +/- HCAP  COPD without evidence of exacerbation. Tobacco dependence. P:   Full vent support. On 70% FiO2 and PEEP 10 >> better.  Wean as able. Not ready today.  VAP prevention measures. Cont gentle diuresis (lasix 40 BID + KCl). Still on pressors but on lower dose.   Cont Abx for  possible HCAP. Fever pattern better with abx.  Cont pressors. Cutting down but still high doses of levophed + neosynephrine Avoiding steroids for shock  2/2 GI issues   CARDIOVASCULAR A:  Hemorrhagic shock - due to bleeding gastric and esophageal varices.  Demand ischemia Pulmonary edema Hx SVT, HTN, dCHF (Echo from Jan 2017 with EF 65-70%, G2DD). P:  Continue pressors Cont gentle  Diuresis as he is still on pressors but pressors at lower doses.  Echo 5/4 EF 45%, hypokinesis Can not give heparin + cardiac drugs (BB, ASA, statin, etc) 2/2 hemorrhagic shock +  cirrhosis   RENAL A:   Azotemia Pulm edema P:   Diuresce gently.    GASTROINTESTINAL A:   Hemorrhagic shock  due to bleeding gastric and esophageal varices.  S/P TIPS and BRTO on 5/3 Hx EtOH cirrhosis, portal HTN / gastropathy, hepatic encephalopathy. P:   Hb and Hct mildly decreased last 24 hrs but has not required transfusion. (-) obvious bleeding.  Checking Hb and Hct q6  still 2/2 shock/being on pressors.  Cont octreotide drip Cont Pantoprazole BID NPO for now. Holding off on [place] 2/2 esophageal varices Continue preadmission lactulose PR   HEMATOLOGIC A:   Hemorrhagic shock Thrombocytopenia. P:  Transfuse for Hgb < 7. Checking Hb and Hct q 6 for next 24 hrs.  SCD's only.   INFECTIOUS A:   Concern for HCAP, possible abd infection.  P:   Cont cefepime + Vanc + flagyl panculture f/u PCT elevated   ENDOCRINE A:   No acute issues.   P:   No interventions required.   NEUROLOGIC A:   Acute encephalopathy, resolved.  Hx EtOH abuse (reportedly stopped 1 year ago). P:   Sedation: Fentanyl gtt / Midazolam PRN. RASS goal: -1. Daily WUA. Continue preadmission thiamine / folate.  Family updated: Plan d/w pt and girlfriend on 5/4.   Interdisciplinary Family Meeting v Palliative Care Meeting:  Due by: 04/11/17.  CC time: 35 min.    Monica Becton, MD 04/06/2017, 8:05 AM [facility] Pager [phone] After 3 pm or if no answer, call [phone]

## 2017-04-06 NOTE — Progress Notes (Signed)
[facility] Progress Note  Subjective: Patient intubated currently not awake, vital signs stable and no active bleeding per nurse  Objective: Vital signs in last 24 hours: Temp:  [98.4 F (36.9 Warren)-100.8 F (38.2 Warren)] 99.5 F (37.5 Warren) (05/05 0800) Pulse Rate:  [99-125] 102 (05/05 0800) Resp:  [17-28] 22 (05/05 0800) BP: (94-115)/(45-71) 112/60 (05/05 0800) SpO2:  [99 %-100 %] 100 % (05/05 0800) Arterial Line BP: (90-141)/(5-66) 141/50 (05/05 0800) FiO2 (%):  [70 %-90 %] 70 % (05/05 0445) Weight:  [104.8 kg (231 lb 0.7 oz)] 104.8 kg (231 lb 0.7 oz) (05/05 0200) Weight change: 10.9 kg (24 lb 0.7 oz)   PE: Unchanged  Lab Results: Results for orders placed or performed during the hospital encounter of 03/29/17 (from the past 24 hour(s))  Troponin I     Status: Abnormal   Collection Time: 04/05/17  9:23 AM  Result Value Ref Range   Troponin I 33.25 (HH) <0.03 ng/mL  Lactic acid, plasma     Status: Abnormal   Collection Time: 04/05/17  9:23 AM  Result Value Ref Range   Lactic Acid, Venous 3.1 (HH) 0.5 - 1.9 mmol/L  Procalcitonin - Baseline     Status: None   Collection Time: 04/05/17  9:23 AM  Result Value Ref Range   Procalcitonin 1.31 ng/mL  Cortisol     Status: None   Collection Time: 04/05/17  9:23 AM  Result Value Ref Range   Cortisol, Plasma 22.6 ug/dL  Hemoglobin and hematocrit, blood     Status: Abnormal   Collection Time: 04/05/17 11:15 AM  Result Value Ref Range   Hemoglobin 12.1 (L) 13.0 - 17.0 g/dL   HCT 35.6 (L) 39.0 - 52.0 %  Glucose, capillary     Status: Abnormal   Collection Time: 04/05/17 11:19 AM  Result Value Ref Range   Glucose-Capillary 151 (H) 65 - 99 mg/dL   Comment 1 Arterial Specimen   Basic metabolic panel     Status: Abnormal   Collection Time: 04/05/17  4:08 PM  Result Value Ref Range   Sodium 136 135 - 145 mmol/L   Potassium 4.3 3.5 - 5.1 mmol/L   Chloride 103 101 - 111 mmol/L   CO2 25 22 - 32 mmol/L   Glucose, Bld 156 (H) 65  - 99 mg/dL   BUN 11 6 - 20 mg/dL   Creatinine, Ser 1.08 0.61 - 1.24 mg/dL   Calcium 7.3 (L) 8.9 - 10.3 mg/dL   GFR calc non Af Amer >60 >60 mL/min   GFR calc Af Amer >60 >60 mL/min   Anion gap 8 5 - 15  Hemoglobin and hematocrit, blood     Status: Abnormal   Collection Time: 04/05/17  5:23 PM  Result Value Ref Range   Hemoglobin 11.7 (L) 13.0 - 17.0 g/dL   HCT 35.2 (L) 39.0 - 52.0 %  Glucose, capillary     Status: Abnormal   Collection Time: 04/05/17  7:30 PM  Result Value Ref Range   Glucose-Capillary 123 (H) 65 - 99 mg/dL   Comment 1 Document in Chart   Culture, respiratory (NON-Expectorated)     Status: None (Preliminary result)   Collection Time: 04/05/17 10:26 PM  Result Value Ref Range   Specimen Description TRACHEAL ASPIRATE    Special Requests Normal    Gram Stain      FEW WBC PRESENT, PREDOMINANTLY PMN NO ORGANISMS SEEN    Culture PENDING    Report Status PENDING   Hemoglobin  and hematocrit, blood     Status: Abnormal   Collection Time: 04/06/17 12:00 AM  Result Value Ref Range   Hemoglobin 11.4 (L) 13.0 - 17.0 g/dL   HCT 35.4 (L) 39.0 - 52.0 %  Glucose, capillary     Status: Abnormal   Collection Time: 04/06/17 12:02 AM  Result Value Ref Range   Glucose-Capillary 140 (H) 65 - 99 mg/dL   Comment 1 Document in Chart   CBC     Status: Abnormal   Collection Time: 04/06/17  3:46 AM  Result Value Ref Range   WBC 21.1 (H) 4.0 - 10.5 K/uL   RBC 3.84 (L) 4.22 - 5.81 MIL/uL   Hemoglobin 10.9 (L) 13.0 - 17.0 g/dL   HCT 34.7 (L) 39.0 - 52.0 %   MCV 90.4 78.0 - 100.0 fL   MCH 28.4 26.0 - 34.0 pg   MCHC 31.4 30.0 - 36.0 g/dL   RDW 17.6 (H) 11.5 - 15.5 %   Platelets 130 (L) 150 - 400 K/uL  Basic metabolic panel     Status: Abnormal   Collection Time: 04/06/17  3:46 AM  Result Value Ref Range   Sodium 138 135 - 145 mmol/L   Potassium 3.5 3.5 - 5.1 mmol/L   Chloride 103 101 - 111 mmol/L   CO2 26 22 - 32 mmol/L   Glucose, Bld 130 (H) 65 - 99 mg/dL   BUN 11 6 - 20  mg/dL   Creatinine, Ser 0.96 0.61 - 1.24 mg/dL   Calcium 7.4 (L) 8.9 - 10.3 mg/dL   GFR calc non Af Amer >60 >60 mL/min   GFR calc Af Amer >60 >60 mL/min   Anion gap 9 5 - 15  Magnesium     Status: None   Collection Time: 04/06/17  3:46 AM  Result Value Ref Range   Magnesium 1.9 1.7 - 2.4 mg/dL  Phosphorus     Status: Abnormal   Collection Time: 04/06/17  3:46 AM  Result Value Ref Range   Phosphorus 2.1 (L) 2.5 - 4.6 mg/dL  Procalcitonin     Status: None   Collection Time: 04/06/17  3:46 AM  Result Value Ref Range   Procalcitonin 1.66 ng/mL  I-STAT 3, arterial blood gas (G3+)     Status: Abnormal   Collection Time: 04/06/17  3:47 AM  Result Value Ref Range   pH, Arterial 7.331 (L) 7.350 - 7.450   pCO2 arterial 54.0 (H) 32.0 - 48.0 mmHg   pO2, Arterial 155.0 (H) 83.0 - 108.0 mmHg   Bicarbonate 28.4 (H) 20.0 - 28.0 mmol/L   TCO2 30 0 - 100 mmol/L   O2 Saturation 99.0 %   Acid-Base Excess 2.0 0.0 - 2.0 mmol/L   Patient temperature 37.5 [place]    Collection site ARTERIAL LINE    Drawn by Operator    Sample type ARTERIAL   Glucose, capillary     Status: Abnormal   Collection Time: 04/06/17  7:37 AM  Result Value Ref Range   Glucose-Capillary 150 (H) 65 - 99 mg/dL   Comment 1 Capillary Specimen    Comment 2 Notify RN     Studies/Results: Ir Angiogram Selective Each Additional Vessel  Result Date: 04/05/2017 CLINICAL DATA:  Cirrhosis with gastric varices. Acute hemorrhage and hemodynamic instability. See previous consultation by Dr. Anselm Pancoast. EXAM: 1. TRANSJUGULAR INTRAHEPATIC PORTOSYSTEMIC SHUNT 2. IR PARACENTESIS 1. ULTRASOUND GUIDANCE FOR VENOUS ACCESS x2 2. TRANSJUGLAR INTRHEPATIC PORTOSYSTEMIC SHUNT (TIPS) CREATION 3. SELECTIVE GASTRIC VARICEAL VENOGRAM 4. PERCUTANEOUS  SCLEROSANT EMBOLIZATION ANESTHESIA/SEDATION: General - as administered by the Anesthesia department MEDICATIONS: Sodium Tetradecyl Sulfate 26m, lipiodol 277miv CONTRAST:  20058mSOVUE-300 IOPAMIDOL (ISOVUE-300) INJECTION  61% FLUOROSCOPY TIME:  Fluoroscopy Time: 61 min 36 sec [id number] COMPLICATIONS: None immediate. PROCEDURE: Informed written consent was obtained from the family after a thorough discussion of the procedural risks, benefits and alternatives. All questions were addressed. Maximal Sterile Barrier Technique was utilized including caps, mask, sterile gowns, sterile gloves, sterile drape, hand hygiene and skin antiseptic. A timeout was performed prior to the initiation of the procedure. The skin overlying the right upper abdominal quadrant as well as the right neck and femoral region were prepped and draped in usual sterile fashion. Initial ultrasound scanning demonstrates a small amount of perihepatic ascites ; no paracentesis was indicated. Under direct ultrasound guidance, a peripheral aspect of the right portal vein was accessed with a 22 gauge needle. Ultrasound image was saved for procedural documentation purposes. The track was dilated with the inner 3 French catheter from the AccPraguet. Several contrast limited portal venograms were performed. A Nitrex wire was advanced through the 3 FrePakistantheter with the radiopaque transition positioned at the target entrance to the peripheral right portal vein. Next, the right internal jugular vein was accessed under direct ultrasound. Ultrasound image was saved for procedural documentation purposes. This allowed for placement of the 10 French TIPS vascular sheath. With the aid of angiographic guidewires, an MPA catheter was utilized to select the right hepatic vein and a hepatic venogram was performed. Under fluoroscopic guidance, the right portal vein with targeted with a Rsch-Uchida TIPS needle directed at the radiopaque target within the right portal vein. Ultimately, the right portal vein was accessed at a desirable location allowing advancement of a stiff Glidewire into the main portal vein. A 4 French glide catheter was advanced over the stiff Glidewire and a  portal venogram was performed. Next, as there was difficulty advancing a measuring catheter through the intrahepatic track, the track was dilated with a 4 mm Mustang balloon, allowing advancement measuring pigtail into the right portal vein. A portal venogram was performed with a measuring Omni Flush catheter. Tract further dilated with 8 mm Conquest balloon to facilitate advancement of the sheath into the main portal vein. The percutaneous portal microcatheter and wire were removed. Next, a 2 cm (un covered) x 6 cm (covered) x 10 mm GORE VIATORR TIPS Endoprosthesis was advanced through the intra portal track and deployed. The TIPS stent was angioplastied in multiple stations to 10 mm diameter. Follow-up portal venogram demonstrated good flow, no residual stenosis. There is continued retrograde collateral flow through dilated left gastric vein to supply the gastric varices, ultimately draining for retroperitoneal collaterals into the left renal vein. A 5 French angiographic catheter was advanced into the dilated left gastric vein with the aid of an Amplatz Glidewire. Under direct ultrasound guidance, the right common femoral vein was accessed with a micropuncture kit. An ultrasound image was saved for procedural documentation purposes. Over a Bentson wire, the track was dilated allowing for placement of a 10 French TIPS sheath to the level of the mid IVC under intermittent fluoroscopic guidance. A Cobra catheter was utilized to select the left renal vein and a left renal venogram was performed. A Rosen wire was advanced into the caudal pole of the left kidney as the access sheath was advanced to the origin of left renal vein. Next, a 4 French angled glide catheter was cannulated within the sheath, next to  the Rosen wire, and with the use of a regular glidewire, was utilized to select the outflow origin of the hypertrophied gastric varix. The Rosen wire was then removed and over a coiled Amplatz wire, the vascular  sheath was advanced into the outflow of the splenorenal shunt. Contrast injection confirmed appropriate positioning. The left gastric vein catheter was exchanged for a 100 cm Renato Battles scientific occlusion balloon. A 65 cm [place] scientific occlusion balloon was then advanced into the caudal aspect of the outflow of the splenorenal shunt. Inflation of the left gastric balloon and subsequent injection confirmed stasis of flow in the gastric varix complex. Inflation of the splenorenal shunt balloon pulled caudal to near vessel's confluence with the left renal veinwith subsequent left gastric injection confirmed stasis of flow in the gastric varix complex. Next, a Lantern micro catheter was advanced through the splenorenal occlusion balloon into the caudal aspect of the markedly hypertrophied shunt. Venography confirmed appropriate positioning. A second Lantern microcatheter was advanced through the left gastric venous occlusion balloon into the left gastric vein. The left gastric occlusion balloon was inflated. At this time, sclerosant was administered (in a mixture of 2 cc of Lipiodol, 4 cc of STS and 6 cc of air) under fluoroscopic guidance. Multiple spot fluoroscopic and radiographic images were obtained during the sclerosant administration. Once sclerosis and was noted into the proximal aspect of the splenorenal shunt, the inferior balloon was inflated. At this time multiple overlapping Ruby coils were deployed within the left renal vein and subsequently within the caudal aspect of the splenorenral shunt. A completion left renal venogram was performed and the procedure was terminated. Follow-up splenic venogram confirms no further flow through the gastric variceal complex. Good antegrade flow through the TIPS shunt. Continued hepatopetal antegrade perfusion through the left portal vein. All wires, catheters and sheaths were removed from the patient. Hemostasis was achieved at the right IJ and groin access sites with  manual compression. A dressing was placed. The patient tolerated the procedure well. Patient transferred to the PACU. Operators:  Henn/ Hassell/ Watts IMPRESSION: 1. Successful creation of a TIPS with reduction of portosystemic gradient to 3 mmHg. 2. Successful ultrasound-guided paracentesis yielding 13 L of ascitic fluid. PLAN: - Continued in-patient management as per the providing clinical service. - Followup post-BATO abd CT  when clinically stable - Patient be seen in follow-up consultation at the interventional radiology clinic with postprocedural TIPS ultrasound and labs (CMP, INR and ammonia levels) in 4-6 weeks. Electronically Signed   By: Lucrezia Europe M.D.   On: 04/05/2017 11:43   Ir Angiogram Selective Each Additional Vessel  Result Date: 04/05/2017 CLINICAL DATA:  Cirrhosis with gastric varices. Acute hemorrhage and hemodynamic instability. See previous consultation by Dr. Anselm Pancoast. EXAM: 1. TRANSJUGULAR INTRAHEPATIC PORTOSYSTEMIC SHUNT 2. IR PARACENTESIS 1. ULTRASOUND GUIDANCE FOR VENOUS ACCESS x2 2. TRANSJUGLAR INTRHEPATIC PORTOSYSTEMIC SHUNT (TIPS) CREATION 3. SELECTIVE GASTRIC VARICEAL VENOGRAM 4. PERCUTANEOUS SCLEROSANT EMBOLIZATION ANESTHESIA/SEDATION: General - as administered by the Anesthesia department MEDICATIONS: Sodium Tetradecyl Sulfate 86m, lipiodol 272miv CONTRAST:  20076mSOVUE-300 IOPAMIDOL (ISOVUE-300) INJECTION 61% FLUOROSCOPY TIME:  Fluoroscopy Time: 61 min 36 sec 1.27.846 COMPLICATIONS: None immediate. PROCEDURE: Informed written consent was obtained from the family after a thorough discussion of the procedural risks, benefits and alternatives. All questions were addressed. Maximal Sterile Barrier Technique was utilized including caps, mask, sterile gowns, sterile gloves, sterile drape, hand hygiene and skin antiseptic. A timeout was performed prior to the initiation of the procedure. The skin overlying the right upper abdominal  quadrant as well as the right neck and femoral region were  prepped and draped in usual sterile fashion. Initial ultrasound scanning demonstrates a small amount of perihepatic ascites ; no paracentesis was indicated. Under direct ultrasound guidance, a peripheral aspect of the right portal vein was accessed with a 22 gauge needle. Ultrasound image was saved for procedural documentation purposes. The track was dilated with the inner 3 French catheter from the Blanket set. Several contrast limited portal venograms were performed. A Nitrex wire was advanced through the 3 [place] catheter with the radiopaque transition positioned at the target entrance to the peripheral right portal vein. Next, the right internal jugular vein was accessed under direct ultrasound. Ultrasound image was saved for procedural documentation purposes. This allowed for placement of the 10 French TIPS vascular sheath. With the aid of angiographic guidewires, an MPA catheter was utilized to select the right hepatic vein and a hepatic venogram was performed. Under fluoroscopic guidance, the right portal vein with targeted with a Rsch-Uchida TIPS needle directed at the radiopaque target within the right portal vein. Ultimately, the right portal vein was accessed at a desirable location allowing advancement of a stiff Glidewire into the main portal vein. A 4 French glide catheter was advanced over the stiff Glidewire and a portal venogram was performed. Next, as there was difficulty advancing a measuring catheter through the intrahepatic track, the track was dilated with a 4 mm Mustang balloon, allowing advancement measuring pigtail into the right portal vein. A portal venogram was performed with a measuring Omni Flush catheter. Tract further dilated with 8 mm Conquest balloon to facilitate advancement of the sheath into the main portal vein. The percutaneous portal microcatheter and wire were removed. Next, a 2 cm (un covered) x 6 cm (covered) x 10 mm GORE VIATORR TIPS Endoprosthesis was advanced  through the intra portal track and deployed. The TIPS stent was angioplastied in multiple stations to 10 mm diameter. Follow-up portal venogram demonstrated good flow, no residual stenosis. There is continued retrograde collateral flow through dilated left gastric vein to supply the gastric varices, ultimately draining for retroperitoneal collaterals into the left renal vein. A 5 French angiographic catheter was advanced into the dilated left gastric vein with the aid of an Amplatz Glidewire. Under direct ultrasound guidance, the right common femoral vein was accessed with a micropuncture kit. An ultrasound image was saved for procedural documentation purposes. Over a Bentson wire, the track was dilated allowing for placement of a 10 French TIPS sheath to the level of the mid IVC under intermittent fluoroscopic guidance. A Cobra catheter was utilized to select the left renal vein and a left renal venogram was performed. A Rosen wire was advanced into the caudal pole of the left kidney as the access sheath was advanced to the origin of left renal vein. Next, a 4 French angled glide catheter was cannulated within the sheath, next to the [place] wire, and with the use of a regular glidewire, was utilized to select the outflow origin of the hypertrophied gastric varix. The Rosen wire was then removed and over a coiled Amplatz wire, the vascular sheath was advanced into the outflow of the splenorenal shunt. Contrast injection confirmed appropriate positioning. The left gastric vein catheter was exchanged for a 100 cm Renato Battles scientific occlusion balloon. A 65 cm [place] scientific occlusion balloon was then advanced into the caudal aspect of the outflow of the splenorenal shunt. Inflation of the left gastric balloon and subsequent injection confirmed stasis of flow in the  gastric varix complex. Inflation of the splenorenal shunt balloon pulled caudal to near vessel's confluence with the left renal veinwith subsequent left  gastric injection confirmed stasis of flow in the gastric varix complex. Next, a Lantern micro catheter was advanced through the splenorenal occlusion balloon into the caudal aspect of the markedly hypertrophied shunt. Venography confirmed appropriate positioning. A second Lantern microcatheter was advanced through the left gastric venous occlusion balloon into the left gastric vein. The left gastric occlusion balloon was inflated. At this time, sclerosant was administered (in a mixture of 2 cc of Lipiodol, 4 cc of STS and 6 cc of air) under fluoroscopic guidance. Multiple spot fluoroscopic and radiographic images were obtained during the sclerosant administration. Once sclerosis and was noted into the proximal aspect of the splenorenal shunt, the inferior balloon was inflated. At this time multiple overlapping Ruby coils were deployed within the left renal vein and subsequently within the caudal aspect of the splenorenral shunt. A completion left renal venogram was performed and the procedure was terminated. Follow-up splenic venogram confirms no further flow through the gastric variceal complex. Good antegrade flow through the TIPS shunt. Continued hepatopetal antegrade perfusion through the left portal vein. All wires, catheters and sheaths were removed from the patient. Hemostasis was achieved at the right IJ and groin access sites with manual compression. A dressing was placed. The patient tolerated the procedure well. Patient transferred to the PACU. Operators:  Henn/ Hassell/ Watts IMPRESSION: 1. Successful creation of a TIPS with reduction of portosystemic gradient to 3 mmHg. 2. Successful ultrasound-guided paracentesis yielding 13 L of ascitic fluid. PLAN: - Continued in-patient management as per the providing clinical service. - Followup post-BATO abd CT  when clinically stable - Patient be seen in follow-up consultation at the interventional radiology clinic with postprocedural TIPS ultrasound and labs  (CMP, INR and ammonia levels) in 4-6 weeks. Electronically Signed   By: Lucrezia Europe M.D.   On: 04/05/2017 11:43   Ir Venogram Renal Uni Right  Result Date: 04/05/2017 CLINICAL DATA:  Cirrhosis with gastric varices. Acute hemorrhage and hemodynamic instability. See previous consultation by Dr. Anselm Pancoast. EXAM: 1. TRANSJUGULAR INTRAHEPATIC PORTOSYSTEMIC SHUNT 2. IR PARACENTESIS 1. ULTRASOUND GUIDANCE FOR VENOUS ACCESS x2 2. TRANSJUGLAR INTRHEPATIC PORTOSYSTEMIC SHUNT (TIPS) CREATION 3. SELECTIVE GASTRIC VARICEAL VENOGRAM 4. PERCUTANEOUS SCLEROSANT EMBOLIZATION ANESTHESIA/SEDATION: General - as administered by the Anesthesia department MEDICATIONS: Sodium Tetradecyl Sulfate 39m, lipiodol 257miv CONTRAST:  20062mSOVUE-300 IOPAMIDOL (ISOVUE-300) INJECTION 61% FLUOROSCOPY TIME:  Fluoroscopy Time: 61 min 36 sec 1.20.569 COMPLICATIONS: None immediate. PROCEDURE: Informed written consent was obtained from the family after a thorough discussion of the procedural risks, benefits and alternatives. All questions were addressed. Maximal Sterile Barrier Technique was utilized including caps, mask, sterile gowns, sterile gloves, sterile drape, hand hygiene and skin antiseptic. A timeout was performed prior to the initiation of the procedure. The skin overlying the right upper abdominal quadrant as well as the right neck and femoral region were prepped and draped in usual sterile fashion. Initial ultrasound scanning demonstrates a small amount of perihepatic ascites ; no paracentesis was indicated. Under direct ultrasound guidance, a peripheral aspect of the right portal vein was accessed with a 22 gauge needle. Ultrasound image was saved for procedural documentation purposes. The track was dilated with the inner 3 French catheter from the AccAlicet. Several contrast limited portal venograms were performed. A Nitrex wire was advanced through the 3 French catheter with the radiopaque transition positioned at the target entrance to  the peripheral  right portal vein. Next, the right internal jugular vein was accessed under direct ultrasound. Ultrasound image was saved for procedural documentation purposes. This allowed for placement of the 10 French TIPS vascular sheath. With the aid of angiographic guidewires, an MPA catheter was utilized to select the right hepatic vein and a hepatic venogram was performed. Under fluoroscopic guidance, the right portal vein with targeted with a Rsch-Uchida TIPS needle directed at the radiopaque target within the right portal vein. Ultimately, the right portal vein was accessed at a desirable location allowing advancement of a stiff Glidewire into the main portal vein. A 4 French glide catheter was advanced over the stiff Glidewire and a portal venogram was performed. Next, as there was difficulty advancing a measuring catheter through the intrahepatic track, the track was dilated with a 4 mm Mustang balloon, allowing advancement measuring pigtail into the right portal vein. A portal venogram was performed with a measuring Omni Flush catheter. Tract further dilated with 8 mm Conquest balloon to facilitate advancement of the sheath into the main portal vein. The percutaneous portal microcatheter and wire were removed. Next, a 2 cm (un covered) x 6 cm (covered) x 10 mm GORE VIATORR TIPS Endoprosthesis was advanced through the intra portal track and deployed. The TIPS stent was angioplastied in multiple stations to 10 mm diameter. Follow-up portal venogram demonstrated good flow, no residual stenosis. There is continued retrograde collateral flow through dilated left gastric vein to supply the gastric varices, ultimately draining for retroperitoneal collaterals into the left renal vein. A 5 French angiographic catheter was advanced into the dilated left gastric vein with the aid of an Amplatz Glidewire. Under direct ultrasound guidance, the right common femoral vein was accessed with a micropuncture kit. An  ultrasound image was saved for procedural documentation purposes. Over a Bentson wire, the track was dilated allowing for placement of a 10 French TIPS sheath to the level of the mid IVC under intermittent fluoroscopic guidance. A Cobra catheter was utilized to select the left renal vein and a left renal venogram was performed. A Rosen wire was advanced into the caudal pole of the left kidney as the access sheath was advanced to the origin of left renal vein. Next, a 4 French angled glide catheter was cannulated within the sheath, next to the [place] wire, and with the use of a regular glidewire, was utilized to select the outflow origin of the hypertrophied gastric varix. The Rosen wire was then removed and over a coiled Amplatz wire, the vascular sheath was advanced into the outflow of the splenorenal shunt. Contrast injection confirmed appropriate positioning. The left gastric vein catheter was exchanged for a 100 cm Renato Battles scientific occlusion balloon. A 65 cm [place] scientific occlusion balloon was then advanced into the caudal aspect of the outflow of the splenorenal shunt. Inflation of the left gastric balloon and subsequent injection confirmed stasis of flow in the gastric varix complex. Inflation of the splenorenal shunt balloon pulled caudal to near vessel's confluence with the left renal veinwith subsequent left gastric injection confirmed stasis of flow in the gastric varix complex. Next, a Lantern micro catheter was advanced through the splenorenal occlusion balloon into the caudal aspect of the markedly hypertrophied shunt. Venography confirmed appropriate positioning. A second Lantern microcatheter was advanced through the left gastric venous occlusion balloon into the left gastric vein. The left gastric occlusion balloon was inflated. At this time, sclerosant was administered (in a mixture of 2 cc of Lipiodol, 4 cc of STS and 6 cc  of air) under fluoroscopic guidance. Multiple spot fluoroscopic and  radiographic images were obtained during the sclerosant administration. Once sclerosis and was noted into the proximal aspect of the splenorenal shunt, the inferior balloon was inflated. At this time multiple overlapping Ruby coils were deployed within the left renal vein and subsequently within the caudal aspect of the splenorenral shunt. A completion left renal venogram was performed and the procedure was terminated. Follow-up splenic venogram confirms no further flow through the gastric variceal complex. Good antegrade flow through the TIPS shunt. Continued hepatopetal antegrade perfusion through the left portal vein. All wires, catheters and sheaths were removed from the patient. Hemostasis was achieved at the right IJ and groin access sites with manual compression. A dressing was placed. The patient tolerated the procedure well. Patient transferred to the PACU. Operators:  Henn/ Hassell/ Watts IMPRESSION: 1. Successful creation of a TIPS with reduction of portosystemic gradient to 3 mmHg. 2. Successful ultrasound-guided paracentesis yielding 13 L of ascitic fluid. PLAN: - Continued in-patient management as per the providing clinical service. - Followup post-BATO abd CT  when clinically stable - Patient be seen in follow-up consultation at the interventional radiology clinic with postprocedural TIPS ultrasound and labs (CMP, INR and ammonia levels) in 4-6 weeks. Electronically Signed   By: Lucrezia Europe M.D.   On: 04/05/2017 11:43   Ir Tips  Result Date: 04/05/2017 CLINICAL DATA:  Cirrhosis with gastric varices. Acute hemorrhage and hemodynamic instability. See previous consultation by Dr. Anselm Pancoast. EXAM: 1. TRANSJUGULAR INTRAHEPATIC PORTOSYSTEMIC SHUNT 2. IR PARACENTESIS 1. ULTRASOUND GUIDANCE FOR VENOUS ACCESS x2 2. TRANSJUGLAR INTRHEPATIC PORTOSYSTEMIC SHUNT (TIPS) CREATION 3. SELECTIVE GASTRIC VARICEAL VENOGRAM 4. PERCUTANEOUS SCLEROSANT EMBOLIZATION ANESTHESIA/SEDATION: General - as administered by the  Anesthesia department MEDICATIONS: Sodium Tetradecyl Sulfate 65m, lipiodol 253miv CONTRAST:  20063mSOVUE-300 IOPAMIDOL (ISOVUE-300) INJECTION 61% FLUOROSCOPY TIME:  Fluoroscopy Time: 61 min 36 sec [id number] COMPLICATIONS: None immediate. PROCEDURE: Informed written consent was obtained from the family after a thorough discussion of the procedural risks, benefits and alternatives. All questions were addressed. Maximal Sterile Barrier Technique was utilized including caps, mask, sterile gowns, sterile gloves, sterile drape, hand hygiene and skin antiseptic. A timeout was performed prior to the initiation of the procedure. The skin overlying the right upper abdominal quadrant as well as the right neck and femoral region were prepped and draped in usual sterile fashion. Initial ultrasound scanning demonstrates a small amount of perihepatic ascites ; no paracentesis was indicated. Under direct ultrasound guidance, a peripheral aspect of the right portal vein was accessed with a 22 gauge needle. Ultrasound image was saved for procedural documentation purposes. The track was dilated with the inner 3 French catheter from the [employer]. Several contrast limited portal venograms were performed. A Nitrex wire was advanced through the 3 FrePakistantheter with the radiopaque transition positioned at the target entrance to the peripheral right portal vein. Next, the right internal jugular vein was accessed under direct ultrasound. Ultrasound image was saved for procedural documentation purposes. This allowed for placement of the 10 French TIPS vascular sheath. With the aid of angiographic guidewires, an MPA catheter was utilized to select the right hepatic vein and a hepatic venogram was performed. Under fluoroscopic guidance, the right portal vein with targeted with a Rsch-Uchida TIPS needle directed at the radiopaque target within the right portal vein. Ultimately, the right portal vein was accessed at a desirable location  allowing advancement of a stiff Glidewire into the main portal vein. A 4 French glide catheter  was advanced over the stiff Glidewire and a portal venogram was performed. Next, as there was difficulty advancing a measuring catheter through the intrahepatic track, the track was dilated with a 4 mm Mustang balloon, allowing advancement measuring pigtail into the right portal vein. A portal venogram was performed with a measuring Omni Flush catheter. Tract further dilated with 8 mm Conquest balloon to facilitate advancement of the sheath into the main portal vein. The percutaneous portal microcatheter and wire were removed. Next, a 2 cm (un covered) x 6 cm (covered) x 10 mm GORE VIATORR TIPS Endoprosthesis was advanced through the intra portal track and deployed. The TIPS stent was angioplastied in multiple stations to 10 mm diameter. Follow-up portal venogram demonstrated good flow, no residual stenosis. There is continued retrograde collateral flow through dilated left gastric vein to supply the gastric varices, ultimately draining for retroperitoneal collaterals into the left renal vein. A 5 French angiographic catheter was advanced into the dilated left gastric vein with the aid of an Amplatz Glidewire. Under direct ultrasound guidance, the right common femoral vein was accessed with a micropuncture kit. An ultrasound image was saved for procedural documentation purposes. Over a Bentson wire, the track was dilated allowing for placement of a 10 French TIPS sheath to the level of the mid IVC under intermittent fluoroscopic guidance. A Cobra catheter was utilized to select the left renal vein and a left renal venogram was performed. A Rosen wire was advanced into the caudal pole of the left kidney as the access sheath was advanced to the origin of left renal vein. Next, a 4 French angled glide catheter was cannulated within the sheath, next to the [place] wire, and with the use of a regular glidewire, was utilized to  select the outflow origin of the hypertrophied gastric varix. The Rosen wire was then removed and over a coiled Amplatz wire, the vascular sheath was advanced into the outflow of the splenorenal shunt. Contrast injection confirmed appropriate positioning. The left gastric vein catheter was exchanged for a 100 cm Renato Battles scientific occlusion balloon. A 65 cm [place] scientific occlusion balloon was then advanced into the caudal aspect of the outflow of the splenorenal shunt. Inflation of the left gastric balloon and subsequent injection confirmed stasis of flow in the gastric varix complex. Inflation of the splenorenal shunt balloon pulled caudal to near vessel's confluence with the left renal veinwith subsequent left gastric injection confirmed stasis of flow in the gastric varix complex. Next, a Lantern micro catheter was advanced through the splenorenal occlusion balloon into the caudal aspect of the markedly hypertrophied shunt. Venography confirmed appropriate positioning. A second Lantern microcatheter was advanced through the left gastric venous occlusion balloon into the left gastric vein. The left gastric occlusion balloon was inflated. At this time, sclerosant was administered (in a mixture of 2 cc of Lipiodol, 4 cc of STS and 6 cc of air) under fluoroscopic guidance. Multiple spot fluoroscopic and radiographic images were obtained during the sclerosant administration. Once sclerosis and was noted into the proximal aspect of the splenorenal shunt, the inferior balloon was inflated. At this time multiple overlapping Ruby coils were deployed within the left renal vein and subsequently within the caudal aspect of the splenorenral shunt. A completion left renal venogram was performed and the procedure was terminated. Follow-up splenic venogram confirms no further flow through the gastric variceal complex. Good antegrade flow through the TIPS shunt. Continued hepatopetal antegrade perfusion through the left portal  vein. All wires, catheters and sheaths were removed  from the patient. Hemostasis was achieved at the right IJ and groin access sites with manual compression. A dressing was placed. The patient tolerated the procedure well. Patient transferred to the PACU. Operators:  Henn/ Hassell/ Watts IMPRESSION: 1. Successful creation of a TIPS with reduction of portosystemic gradient to 3 mmHg. 2. Successful ultrasound-guided paracentesis yielding 13 L of ascitic fluid. PLAN: - Continued in-patient management as per the providing clinical service. - Followup post-BATO abd CT  when clinically stable - Patient be seen in follow-up consultation at the interventional radiology clinic with postprocedural TIPS ultrasound and labs (CMP, INR and ammonia levels) in 4-6 weeks. Electronically Signed   By: Lucrezia Europe M.D.   On: 04/05/2017 11:43   Ir US Guide Vasc Access Right  Result Date: 04/05/2017 CLINICAL DATA:  Cirrhosis with gastric varices. Acute hemorrhage and hemodynamic instability. See previous consultation by Dr. Anselm Pancoast. EXAM: 1. TRANSJUGULAR INTRAHEPATIC PORTOSYSTEMIC SHUNT 2. IR PARACENTESIS 1. ULTRASOUND GUIDANCE FOR VENOUS ACCESS x2 2. TRANSJUGLAR INTRHEPATIC PORTOSYSTEMIC SHUNT (TIPS) CREATION 3. SELECTIVE GASTRIC VARICEAL VENOGRAM 4. PERCUTANEOUS SCLEROSANT EMBOLIZATION ANESTHESIA/SEDATION: General - as administered by the Anesthesia department MEDICATIONS: Sodium Tetradecyl Sulfate 5m, lipiodol 269miv CONTRAST:  20056mSOVUE-300 IOPAMIDOL (ISOVUE-300) INJECTION 61% FLUOROSCOPY TIME:  Fluoroscopy Time: 61 min 36 sec 1.25.830 COMPLICATIONS: None immediate. PROCEDURE: Informed written consent was obtained from the family after a thorough discussion of the procedural risks, benefits and alternatives. All questions were addressed. Maximal Sterile Barrier Technique was utilized including caps, mask, sterile gowns, sterile gloves, sterile drape, hand hygiene and skin antiseptic. A timeout was performed prior to the initiation  of the procedure. The skin overlying the right upper abdominal quadrant as well as the right neck and femoral region were prepped and draped in usual sterile fashion. Initial ultrasound scanning demonstrates a small amount of perihepatic ascites ; no paracentesis was indicated. Under direct ultrasound guidance, a peripheral aspect of the right portal vein was accessed with a 22 gauge needle. Ultrasound image was saved for procedural documentation purposes. The track was dilated with the inner 3 French catheter from the [place]. Several contrast limited portal venograms were performed. A Nitrex wire was advanced through the 3 FrePakistantheter with the radiopaque transition positioned at the target entrance to the peripheral right portal vein. Next, the right internal jugular vein was accessed under direct ultrasound. Ultrasound image was saved for procedural documentation purposes. This allowed for placement of the 10 French TIPS vascular sheath. With the aid of angiographic guidewires, an MPA catheter was utilized to select the right hepatic vein and a hepatic venogram was performed. Under fluoroscopic guidance, the right portal vein with targeted with a Rsch-Uchida TIPS needle directed at the radiopaque target within the right portal vein. Ultimately, the right portal vein was accessed at a desirable location allowing advancement of a stiff Glidewire into the main portal vein. A 4 French glide catheter was advanced over the stiff Glidewire and a portal venogram was performed. Next, as there was difficulty advancing a measuring catheter through the intrahepatic track, the track was dilated with a 4 mm Mustang balloon, allowing advancement measuring pigtail into the right portal vein. A portal venogram was performed with a measuring Omni Flush catheter. Tract further dilated with 8 mm Conquest balloon to facilitate advancement of the sheath into the main portal vein. The percutaneous portal microcatheter and  wire were removed. Next, a 2 cm (un covered) x 6 cm (covered) x 10 mm GORE VIATORR TIPS Endoprosthesis was advanced through the  intra portal track and deployed. The TIPS stent was angioplastied in multiple stations to 10 mm diameter. Follow-up portal venogram demonstrated good flow, no residual stenosis. There is continued retrograde collateral flow through dilated left gastric vein to supply the gastric varices, ultimately draining for retroperitoneal collaterals into the left renal vein. A 5 French angiographic catheter was advanced into the dilated left gastric vein with the aid of an Amplatz Glidewire. Under direct ultrasound guidance, the right common femoral vein was accessed with a micropuncture kit. An ultrasound image was saved for procedural documentation purposes. Over a Bentson wire, the track was dilated allowing for placement of a 10 French TIPS sheath to the level of the mid IVC under intermittent fluoroscopic guidance. A Cobra catheter was utilized to select the left renal vein and a left renal venogram was performed. A Rosen wire was advanced into the caudal pole of the left kidney as the access sheath was advanced to the origin of left renal vein. Next, a 4 French angled glide catheter was cannulated within the sheath, next to the [place] wire, and with the use of a regular glidewire, was utilized to select the outflow origin of the hypertrophied gastric varix. The Rosen wire was then removed and over a coiled Amplatz wire, the vascular sheath was advanced into the outflow of the splenorenal shunt. Contrast injection confirmed appropriate positioning. The left gastric vein catheter was exchanged for a 100 cm Renato Battles scientific occlusion balloon. A 65 cm [place] scientific occlusion balloon was then advanced into the caudal aspect of the outflow of the splenorenal shunt. Inflation of the left gastric balloon and subsequent injection confirmed stasis of flow in the gastric varix complex. Inflation of  the splenorenal shunt balloon pulled caudal to near vessel's confluence with the left renal veinwith subsequent left gastric injection confirmed stasis of flow in the gastric varix complex. Next, a Lantern micro catheter was advanced through the splenorenal occlusion balloon into the caudal aspect of the markedly hypertrophied shunt. Venography confirmed appropriate positioning. A second Lantern microcatheter was advanced through the left gastric venous occlusion balloon into the left gastric vein. The left gastric occlusion balloon was inflated. At this time, sclerosant was administered (in a mixture of 2 cc of Lipiodol, 4 cc of STS and 6 cc of air) under fluoroscopic guidance. Multiple spot fluoroscopic and radiographic images were obtained during the sclerosant administration. Once sclerosis and was noted into the proximal aspect of the splenorenal shunt, the inferior balloon was inflated. At this time multiple overlapping Ruby coils were deployed within the left renal vein and subsequently within the caudal aspect of the splenorenral shunt. A completion left renal venogram was performed and the procedure was terminated. Follow-up splenic venogram confirms no further flow through the gastric variceal complex. Good antegrade flow through the TIPS shunt. Continued hepatopetal antegrade perfusion through the left portal vein. All wires, catheters and sheaths were removed from the patient. Hemostasis was achieved at the right IJ and groin access sites with manual compression. A dressing was placed. The patient tolerated the procedure well. Patient transferred to the PACU. Operators:  Henn/ Hassell/ Watts IMPRESSION: 1. Successful creation of a TIPS with reduction of portosystemic gradient to 3 mmHg. 2. Successful ultrasound-guided paracentesis yielding 13 L of ascitic fluid. PLAN: - Continued in-patient management as per the providing clinical service. - Followup post-BATO abd CT  when clinically stable - Patient be  seen in follow-up consultation at the interventional radiology clinic with postprocedural TIPS ultrasound and labs (CMP, INR and  ammonia levels) in 4-6 weeks. Electronically Signed   By: Lucrezia Europe M.D.   On: 04/05/2017 11:43   Dg Chest Port 1 View  Result Date: 04/05/2017 CLINICAL DATA:  Respiratory failure. EXAM: PORTABLE CHEST 1 VIEW COMPARISON:  04/04/2017.  03/29/2017. FINDINGS: Endotracheal tube and right IJ line in stable position. Heart size normal. Diffuse severe bilateral pulmonary interstitial prominence consistent with interstitial edema and/or pneumonitis again noted without interim change. Small left pleural effusion. Biapical pleural thickening noted consistent scarring. Surgical coils noted in the upper abdomen. IMPRESSION: 1.  Lines and tubes in stable position. 2. Diffuse severe bilateral from interstitial prominence again noted consistent with interstitial edema and/or pneumonitis. No interim change. Small left pleural effusion. Electronically Signed   By: Marcello Moores  Register   On: 04/05/2017 07:01   Dg Chest Port 1 View  Result Date: 04/04/2017 CLINICAL DATA:  Status post endotracheal intubation. EXAM: PORTABLE CHEST 1 VIEW COMPARISON:  Radiographs of Apr 02, 2017. FINDINGS: The heart size and mediastinal contours are within normal limits. Endotracheal tube is seen projected over tracheal air shadow with distal tip 7.5 cm above the carina. Right internal jugular catheter is noted with distal tip in expected position of the SVC. No pneumothorax is noted. There is interval development of diffuse reticulonodular opacities throughout both lungs most consistent with inflammation or pneumonia or possibly edema. Mild right pleural effusion is noted. The visualized skeletal structures are unremarkable. IMPRESSION: Endotracheal tube and right internal jugular venous catheter in grossly good position. Interval development of bilateral diffuse lung opacities as described above, most consistent with  inflammation or pneumonia, or less likely edema. Mild right pleural effusion is noted. Electronically Signed   By: Marijo Conception, M.D.   On: 04/04/2017 11:36   [place] Guide Roadmapping  Result Date: 04/05/2017 CLINICAL DATA:  Cirrhosis with gastric varices. Acute hemorrhage and hemodynamic instability. See previous consultation by Dr. Anselm Pancoast. EXAM: 1. TRANSJUGULAR INTRAHEPATIC PORTOSYSTEMIC SHUNT 2. IR PARACENTESIS 1. ULTRASOUND GUIDANCE FOR VENOUS ACCESS x2 2. TRANSJUGLAR INTRHEPATIC PORTOSYSTEMIC SHUNT (TIPS) CREATION 3. SELECTIVE GASTRIC VARICEAL VENOGRAM 4. PERCUTANEOUS SCLEROSANT EMBOLIZATION ANESTHESIA/SEDATION: General - as administered by the Anesthesia department MEDICATIONS: Sodium Tetradecyl Sulfate 3m, lipiodol 249miv CONTRAST:  20077mSOVUE-300 IOPAMIDOL (ISOVUE-300) INJECTION 61% FLUOROSCOPY TIME:  Fluoroscopy Time: 61 min 36 sec 1.20.277 COMPLICATIONS: None immediate. PROCEDURE: Informed written consent was obtained from the family after a thorough discussion of the procedural risks, benefits and alternatives. All questions were addressed. Maximal Sterile Barrier Technique was utilized including caps, mask, sterile gowns, sterile gloves, sterile drape, hand hygiene and skin antiseptic. A timeout was performed prior to the initiation of the procedure. The skin overlying the right upper abdominal quadrant as well as the right neck and femoral region were prepped and draped in usual sterile fashion. Initial ultrasound scanning demonstrates a small amount of perihepatic ascites ; no paracentesis was indicated. Under direct ultrasound guidance, a peripheral aspect of the right portal vein was accessed with a 22 gauge needle. Ultrasound image was saved for procedural documentation purposes. The track was dilated with the inner 3 French catheter from the [place]. Several contrast limited portal venograms were performed. A Nitrex wire was advanced through the 3  FrePakistantheter with the radiopaque transition positioned at the target entrance to the peripheral right portal vein. Next, the right internal jugular vein was accessed under direct ultrasound. Ultrasound image was saved for procedural documentation purposes. This allowed for placement of  the 10 [place] TIPS vascular sheath. With the aid of angiographic guidewires, an MPA catheter was utilized to select the right hepatic vein and a hepatic venogram was performed. Under fluoroscopic guidance, the right portal vein with targeted with a Rsch-Uchida TIPS needle directed at the radiopaque target within the right portal vein. Ultimately, the right portal vein was accessed at a desirable location allowing advancement of a stiff Glidewire into the main portal vein. A 4 French glide catheter was advanced over the stiff Glidewire and a portal venogram was performed. Next, as there was difficulty advancing a measuring catheter through the intrahepatic track, the track was dilated with a 4 mm Mustang balloon, allowing advancement measuring pigtail into the right portal vein. A portal venogram was performed with a measuring Omni Flush catheter. Tract further dilated with 8 mm Conquest balloon to facilitate advancement of the sheath into the main portal vein. The percutaneous portal microcatheter and wire were removed. Next, a 2 cm (un covered) x 6 cm (covered) x 10 mm GORE VIATORR TIPS Endoprosthesis was advanced through the intra portal track and deployed. The TIPS stent was angioplastied in multiple stations to 10 mm diameter. Follow-up portal venogram demonstrated good flow, no residual stenosis. There is continued retrograde collateral flow through dilated left gastric vein to supply the gastric varices, ultimately draining for retroperitoneal collaterals into the left renal vein. A 5 French angiographic catheter was advanced into the dilated left gastric vein with the aid of an Amplatz Glidewire. Under direct ultrasound  guidance, the right common femoral vein was accessed with a micropuncture kit. An ultrasound image was saved for procedural documentation purposes. Over a Bentson wire, the track was dilated allowing for placement of a 10 French TIPS sheath to the level of the mid IVC under intermittent fluoroscopic guidance. A Cobra catheter was utilized to select the left renal vein and a left renal venogram was performed. A Rosen wire was advanced into the caudal pole of the left kidney as the access sheath was advanced to the origin of left renal vein. Next, a 4 French angled glide catheter was cannulated within the sheath, next to the [place] wire, and with the use of a regular glidewire, was utilized to select the outflow origin of the hypertrophied gastric varix. The Rosen wire was then removed and over a coiled Amplatz wire, the vascular sheath was advanced into the outflow of the splenorenal shunt. Contrast injection confirmed appropriate positioning. The left gastric vein catheter was exchanged for a 100 cm Renato Battles scientific occlusion balloon. A 65 cm [place] scientific occlusion balloon was then advanced into the caudal aspect of the outflow of the splenorenal shunt. Inflation of the left gastric balloon and subsequent injection confirmed stasis of flow in the gastric varix complex. Inflation of the splenorenal shunt balloon pulled caudal to near vessel's confluence with the left renal veinwith subsequent left gastric injection confirmed stasis of flow in the gastric varix complex. Next, a Lantern micro catheter was advanced through the splenorenal occlusion balloon into the caudal aspect of the markedly hypertrophied shunt. Venography confirmed appropriate positioning. A second Lantern microcatheter was advanced through the left gastric venous occlusion balloon into the left gastric vein. The left gastric occlusion balloon was inflated. At this time, sclerosant was administered (in a mixture of 2 cc of Lipiodol, 4 cc of STS  and 6 cc of air) under fluoroscopic guidance. Multiple spot fluoroscopic and radiographic images were obtained during the sclerosant administration. Once sclerosis and was noted into the proximal aspect  of the splenorenal shunt, the inferior balloon was inflated. At this time multiple overlapping Ruby coils were deployed within the left renal vein and subsequently within the caudal aspect of the splenorenral shunt. A completion left renal venogram was performed and the procedure was terminated. Follow-up splenic venogram confirms no further flow through the gastric variceal complex. Good antegrade flow through the TIPS shunt. Continued hepatopetal antegrade perfusion through the left portal vein. All wires, catheters and sheaths were removed from the patient. Hemostasis was achieved at the right IJ and groin access sites with manual compression. A dressing was placed. The patient tolerated the procedure well. Patient transferred to the PACU. Operators:  Henn/ Hassell/ Watts IMPRESSION: 1. Successful creation of a TIPS with reduction of portosystemic gradient to 3 mmHg. 2. Successful ultrasound-guided paracentesis yielding 13 L of ascitic fluid. PLAN: - Continued in-patient management as per the providing clinical service. - Followup post-BATO abd CT  when clinically stable - Patient be seen in follow-up consultation at the interventional radiology clinic with postprocedural TIPS ultrasound and labs (CMP, INR and ammonia levels) in 4-6 weeks. Electronically Signed   By: Lucrezia Europe M.D.   On: 04/05/2017 11:43   [place] [facility] Roadmapping  Result Date: 04/05/2017 CLINICAL DATA:  Cirrhosis with gastric varices. Acute hemorrhage and hemodynamic instability. See previous consultation by Dr. Anselm Pancoast. EXAM: 1. TRANSJUGULAR INTRAHEPATIC PORTOSYSTEMIC SHUNT 2. IR PARACENTESIS 1. ULTRASOUND GUIDANCE FOR VENOUS ACCESS x2 2. TRANSJUGLAR INTRHEPATIC PORTOSYSTEMIC SHUNT (TIPS) CREATION 3. SELECTIVE  GASTRIC VARICEAL VENOGRAM 4. PERCUTANEOUS SCLEROSANT EMBOLIZATION ANESTHESIA/SEDATION: General - as administered by the Anesthesia department MEDICATIONS: Sodium Tetradecyl Sulfate 82m, lipiodol 293miv CONTRAST:  [id number] IOPAMIDOL (ISOVUE-300) INJECTION 61% FLUOROSCOPY TIME:  Fluoroscopy Time: 61 min 36 sec [id number] COMPLICATIONS: None immediate. PROCEDURE: Informed written consent was obtained from the family after a thorough discussion of the procedural risks, benefits and alternatives. All questions were addressed. Maximal Sterile Barrier Technique was utilized including caps, mask, sterile gowns, sterile gloves, sterile drape, hand hygiene and skin antiseptic. A timeout was performed prior to the initiation of the procedure. The skin overlying the right upper abdominal quadrant as well as the right neck and femoral region were prepped and draped in usual sterile fashion. Initial ultrasound scanning demonstrates a small amount of perihepatic ascites ; no paracentesis was indicated. Under direct ultrasound guidance, a peripheral aspect of the right portal vein was accessed with a 22 gauge needle. Ultrasound image was saved for procedural documentation purposes. The track was dilated with the inner 3 French catheter from the [employer] [place]. Several contrast limited portal venograms were performed. A Nitrex wire was advanced through the 3 FrePakistantheter with the radiopaque transition positioned at the target entrance to the peripheral right portal vein. Next, the right internal jugular vein was accessed under direct ultrasound. Ultrasound image was saved for procedural documentation purposes. This allowed for placement of the 10 French TIPS vascular sheath. With the aid of angiographic guidewires, an MPA catheter was utilized to select the right hepatic vein and a hepatic venogram was performed. Under fluoroscopic guidance, the right portal vein with targeted with a Rsch-Uchida TIPS needle directed at the  radiopaque target within the right portal vein. Ultimately, the right portal vein was accessed at a desirable location allowing advancement of a stiff Glidewire into the main portal vein. A 4 French glide catheter was advanced over the stiff Glidewire and a portal venogram was performed. Next, as there was difficulty  advancing a measuring catheter through the intrahepatic track, the track was dilated with a 4 mm Mustang balloon, allowing advancement measuring pigtail into the right portal vein. A portal venogram was performed with a measuring Omni Flush catheter. Tract further dilated with 8 mm Conquest balloon to facilitate advancement of the sheath into the main portal vein. The percutaneous portal microcatheter and wire were removed. Next, a 2 cm (un covered) x 6 cm (covered) x 10 mm GORE VIATORR TIPS Endoprosthesis was advanced through the intra portal track and deployed. The TIPS stent was angioplastied in multiple stations to 10 mm diameter. Follow-up portal venogram demonstrated good flow, no residual stenosis. There is continued retrograde collateral flow through dilated left gastric vein to supply the gastric varices, ultimately draining for retroperitoneal collaterals into the left renal vein. A 5 French angiographic catheter was advanced into the dilated left gastric vein with the aid of an Amplatz Glidewire. Under direct ultrasound guidance, the right common femoral vein was accessed with a micropuncture kit. An ultrasound image was saved for procedural documentation purposes. Over a Bentson wire, the track was dilated allowing for placement of a 10 French TIPS sheath to the level of the mid IVC under intermittent fluoroscopic guidance. A Cobra catheter was utilized to select the left renal vein and a left renal venogram was performed. A Rosen wire was advanced into the caudal pole of the left kidney as the access sheath was advanced to the origin of left renal vein. Next, a 4 French angled glide  catheter was cannulated within the sheath, next to the [place] wire, and with the use of a regular glidewire, was utilized to select the outflow origin of the hypertrophied gastric varix. The Rosen wire was then removed and over a coiled Amplatz wire, the vascular sheath was advanced into the outflow of the splenorenal shunt. Contrast injection confirmed appropriate positioning. The left gastric vein catheter was exchanged for a 100 cm Renato Battles scientific occlusion balloon. A 65 cm [place] scientific occlusion balloon was then advanced into the caudal aspect of the outflow of the splenorenal shunt. Inflation of the left gastric balloon and subsequent injection confirmed stasis of flow in the gastric varix complex. Inflation of the splenorenal shunt balloon pulled caudal to near vessel's confluence with the left renal veinwith subsequent left gastric injection confirmed stasis of flow in the gastric varix complex. Next, a Lantern micro catheter was advanced through the splenorenal occlusion balloon into the caudal aspect of the markedly hypertrophied shunt. Venography confirmed appropriate positioning. A second Lantern microcatheter was advanced through the left gastric venous occlusion balloon into the left gastric vein. The left gastric occlusion balloon was inflated. At this time, sclerosant was administered (in a mixture of 2 cc of Lipiodol, 4 cc of STS and 6 cc of air) under fluoroscopic guidance. Multiple spot fluoroscopic and radiographic images were obtained during the sclerosant administration. Once sclerosis and was noted into the proximal aspect of the splenorenal shunt, the inferior balloon was inflated. At this time multiple overlapping Ruby coils were deployed within the left renal vein and subsequently within the caudal aspect of the splenorenral shunt. A completion left renal venogram was performed and the procedure was terminated. Follow-up splenic venogram confirms no further flow through the gastric  variceal complex. Good antegrade flow through the TIPS shunt. Continued hepatopetal antegrade perfusion through the left portal vein. All wires, catheters and sheaths were removed from the patient. Hemostasis was achieved at the right IJ and groin access sites with manual compression.  A dressing was placed. The patient tolerated the procedure well. Patient transferred to the PACU. Operators:  Henn/ Hassell/ Watts IMPRESSION: 1. Successful creation of a TIPS with reduction of portosystemic gradient to 3 mmHg. 2. Successful ultrasound-guided paracentesis yielding 13 L of ascitic fluid. PLAN: - Continued in-patient management as per the providing clinical service. - Followup post-BATO abd CT  when clinically stable - Patient be seen in follow-up consultation at the interventional radiology clinic with postprocedural TIPS ultrasound and labs (CMP, INR and ammonia levels) in 4-6 weeks. Electronically Signed   By: Lucrezia Europe M.D.   On: 04/05/2017 11:43      Assessment: Actively bleeding gastric varix, status post TI PS/BATO  Plan: Continue octreotide Wean from ventilator as tolerated Monitor stools and hemoglobin for rebleeding    John Warren 04/06/2017, 8:19 AM  Pager [phone] If no answer or after 5 PM call [phone]

## 2017-04-06 NOTE — Progress Notes (Signed)
Progress Note  Patient Name: John Warren Date of Encounter: 04/06/2017  Primary Cardiologist: [place] intubated and sedated. 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 Not a cath candidate at this time d/t GI bleeding. Troponin rise to 33.  Inpatient Medications    Scheduled Meds: . budesonide (PULMICORT) nebulizer solution  0.5 mg Nebulization BID  . chlorhexidine gluconate (MEDLINE KIT)  15 mL Mouth Rinse BID  . Chlorhexidine Gluconate Cloth  6 each Topical Daily  . folic acid  1 mg Intravenous Daily  . furosemide  40 mg Intravenous Q12H  . ipratropium-albuterol  3 mL Nebulization Q6H  . lactulose  300 mL Rectal BID  . mouth rinse  15 mL Mouth Rinse QID  . pantoprazole (PROTONIX) IV  40 mg Intravenous Q12H  . rifaximin  550 mg Per Tube BID  . rocuronium  50 mg Intravenous Once  . sodium chloride flush  10-40 mL Intracatheter Q12H  . thiamine  100 mg Intravenous Daily   Continuous Infusions: . sodium chloride 10 mL/hr at 04/06/17 0800  . sodium chloride 10 mL/hr at 04/06/17 0800  . ceFEPime (MAXIPIME) IV Stopped (04/06/17 0545)  . DOPamine    . fentaNYL infusion INTRAVENOUS 200 mcg/hr (04/06/17 0800)  . metronidazole Stopped (04/06/17 0121)  . norepinephrine (LEVOPHED) Adult infusion 15 mcg/min (04/06/17 0800)  . octreotide  (SANDOSTATIN)    IV infusion 50 mcg/hr (04/06/17 0800)  . phenylephrine 28m/500mL (0.160mmL) infusion 300 mcg/min (04/06/17 0800)  . potassium chloride    . vancomycin (VANCOCIN) IVPB 1000 mg/100 mL central line Stopped (04/06/17 0300)   PRN Meds: acetaminophen **OR** acetaminophen, fentaNYL, midazolam, midazolam, sodium chloride flush   Vital Signs    Vitals:   04/06/17 0630 04/06/17 0645 04/06/17 0700 04/06/17 0800  BP: (!) 101/58  (!) 106/58 112/60  Pulse: (!) 101 (!)  101 100 (!) 102  Resp: (!) 24 (!) 24 (!) 24 (!) 22  Temp: 99.3 F (37.4 C) 99.3 F (37.4 C) 99.3 F (37.4 C) 99.5 F (37.5 C)  TempSrc:      SpO2: 100% 100% 100% 100%  Weight:      Height:        Intake/Output Summary (Last 24 hours) at 04/06/17 0840 Last data filed at 04/06/17 0800  Gross per 24 hour  Intake          6536.15 ml  Output             4205 ml  Net          2331.15 ml   Filed Weights   04/04/17 0802 04/05/17 0130 04/06/17 0200  Weight: 207 lb (93.9 kg) 227 lb 8.2 oz (103.2 kg) 231 lb 0.7 oz (104.8 kg)    Telemetry    Sinus tachycardia at 100-105 - Personally Reviewed  ECG    No EKG today  Physical Exam  General appearance: no distress and intubated, sedated on vent, awakens to voice Neck: no carotid bruit and no JVD Lungs: diminished breath sounds bilaterally Heart: systolic murmur: early systolic 2/6, blowing at apex and regular tachycardia Abdomen: soft, non-tender; bowel sounds normal; no masses,  no organomegaly Extremities: edema 2+ pedal Pulses: 2+ and symmetric Skin: pale, cool, dry Neurologic: Mental status: awakens to voice, waves right hand Psych: Pleasant   Labs    Chemistry Recent Labs Lab 04/01/17 0301  04/04/17 1918  04/05/17 0430 04/05/17 1608 04/06/17 0346  NA 135  < > 137 137 136 138  K 3.8  < > 3.8 4.3 4.3 3.5  CL 111  < > 102 103 103 103  CO2 20*  < > '25 23 25 26  ' GLUCOSE 130*  < > 192* 135* 156* 130*  BUN 13  < > '9 11 11 11  ' CREATININE 0.78  < > 0.89 1.16 1.08 0.96  CALCIUM 7.4*  < > 7.2* 7.2* 7.3* 7.4*  PROT 4.2*  --  5.6*  --   --   --   ALBUMIN 2.3*  --  3.5  --   --   --   AST 26  --  66*  --   --   --   ALT 18  --  25  --   --   --   ALKPHOS 52  --  63  --   --   --   BILITOT 0.7  --  3.3*  --   --   --   GFRNONAA >60  < > >60 >60 >60 >60  GFRAA >60  < > >60 >60 >60 >60  ANIONGAP 4*  < > '10 11 8 9  ' < > = values in this interval not displayed.   Hematology Recent Labs Lab 04/04/17 2348 04/05/17 0430   04/05/17 1723 04/06/17 0000 04/06/17 0346  WBC 24.2* 26.4*  --   --   --  21.1*  RBC 4.06* 4.13*  --   --   --  3.84*  HGB 11.7* 12.0*  < > 11.7* 11.4* 10.9*  HCT 35.8* 36.4*  < > 35.2* 35.4* 34.7*  MCV 88.2 88.1  --   --   --  90.4  MCH 28.8 29.1  --   --   --  28.4  MCHC 32.7 33.0  --   --   --  31.4  RDW 16.7* 17.3*  --   --   --  17.6*  PLT 171 186  --   --   --  130*  < > = values in this interval not displayed.  Cardiac Enzymes  Recent Labs Lab 03/31/17 0812 04/04/17 1918 04/04/17 2348 04/05/17 0923  TROPONINI 0.05* 3.85* 15.43* 33.25*   No results for input(s): TROPIPOC in the last 168 hours.   BNP  Recent Labs Lab 04/04/17 1530  BNP 96.1     DDimer No results for input(s): DDIMER in the last 168 hours.   Radiology    Ir Angiogram Selective Each Additional Vessel  Result Date: 04/05/2017 CLINICAL DATA:  Cirrhosis with gastric varices. Acute hemorrhage and hemodynamic instability. See previous consultation by Dr. Anselm Pancoast. EXAM: 1. TRANSJUGULAR INTRAHEPATIC PORTOSYSTEMIC SHUNT 2. IR PARACENTESIS 1. ULTRASOUND GUIDANCE FOR VENOUS ACCESS x2 2. TRANSJUGLAR INTRHEPATIC PORTOSYSTEMIC SHUNT (TIPS) CREATION 3. SELECTIVE GASTRIC VARICEAL VENOGRAM 4. PERCUTANEOUS SCLEROSANT EMBOLIZATION ANESTHESIA/SEDATION: General - as administered by the Anesthesia department MEDICATIONS: Sodium Tetradecyl Sulfate 22m, lipiodol 212miv CONTRAST:  20046mSOVUE-300 IOPAMIDOL (ISOVUE-300) INJECTION 61% FLUOROSCOPY TIME:  Fluoroscopy Time: 61 min 36 sec [id number] COMPLICATIONS: None immediate. PROCEDURE: Informed written consent was obtained from the family after a thorough discussion of the procedural risks, benefits and alternatives. All questions were addressed. Maximal Sterile Barrier Technique was utilized including caps, mask, sterile gowns, sterile gloves, sterile drape, hand hygiene and skin antiseptic. A timeout was performed prior to the initiation of the procedure. The skin overlying the right  upper abdominal quadrant as well as  the right neck and femoral region were prepped and draped in usual sterile fashion. Initial ultrasound scanning demonstrates a small amount of perihepatic ascites ; no paracentesis was indicated. Under direct ultrasound guidance, a peripheral aspect of the right portal vein was accessed with a 22 gauge needle. Ultrasound image was saved for procedural documentation purposes. The track was dilated with the inner 3 French catheter from the [place] set. Several contrast limited portal venograms were performed. A Nitrex wire was advanced through the 3 [place] catheter with the radiopaque transition positioned at the target entrance to the peripheral right portal vein. Next, the right internal jugular vein was accessed under direct ultrasound. Ultrasound image was saved for procedural documentation purposes. This allowed for placement of the 10 French TIPS vascular sheath. With the aid of angiographic guidewires, an MPA catheter was utilized to select the right hepatic vein and a hepatic venogram was performed. Under fluoroscopic guidance, the right portal vein with targeted with a Rsch-Uchida TIPS needle directed at the radiopaque target within the right portal vein. Ultimately, the right portal vein was accessed at a desirable location allowing advancement of a stiff Glidewire into the main portal vein. A 4 French glide catheter was advanced over the stiff Glidewire and a portal venogram was performed. Next, as there was difficulty advancing a measuring catheter through the intrahepatic track, the track was dilated with a 4 mm Mustang balloon, allowing advancement measuring pigtail into the right portal vein. A portal venogram was performed with a measuring Omni Flush catheter. Tract further dilated with 8 mm Conquest balloon to facilitate advancement of the sheath into the main portal vein. The percutaneous portal microcatheter and wire were removed. Next, a 2 cm (un covered) x 6  cm (covered) x 10 mm GORE VIATORR TIPS Endoprosthesis was advanced through the intra portal track and deployed. The TIPS stent was angioplastied in multiple stations to 10 mm diameter. Follow-up portal venogram demonstrated good flow, no residual stenosis. There is continued retrograde collateral flow through dilated left gastric vein to supply the gastric varices, ultimately draining for retroperitoneal collaterals into the left renal vein. A 5 French angiographic catheter was advanced into the dilated left gastric vein with the aid of an Amplatz Glidewire. Under direct ultrasound guidance, the right common femoral vein was accessed with a micropuncture kit. An ultrasound image was saved for procedural documentation purposes. Over a Bentson wire, the track was dilated allowing for placement of a 10 French TIPS sheath to the level of the mid IVC under intermittent fluoroscopic guidance. A Cobra catheter was utilized to select the left renal vein and a left renal venogram was performed. A Rosen wire was advanced into the caudal pole of the left kidney as the access sheath was advanced to the origin of left renal vein. Next, a 4 French angled glide catheter was cannulated within the sheath, next to the [place] wire, and with the use of a regular glidewire, was utilized to select the outflow origin of the hypertrophied gastric varix. The Rosen wire was then removed and over a coiled Amplatz wire, the vascular sheath was advanced into the outflow of the splenorenal shunt. Contrast injection confirmed appropriate positioning. The left gastric vein catheter was exchanged for a 100 cm Renato Battles scientific occlusion balloon. A 65 cm [place] scientific occlusion balloon was then advanced into the caudal aspect of the outflow of the splenorenal shunt. Inflation of the left gastric balloon and subsequent injection confirmed stasis of flow in the gastric varix complex. Inflation of  the splenorenal shunt balloon pulled caudal to  near vessel's confluence with the left renal veinwith subsequent left gastric injection confirmed stasis of flow in the gastric varix complex. Next, a Lantern micro catheter was advanced through the splenorenal occlusion balloon into the caudal aspect of the markedly hypertrophied shunt. Venography confirmed appropriate positioning. A second Lantern microcatheter was advanced through the left gastric venous occlusion balloon into the left gastric vein. The left gastric occlusion balloon was inflated. At this time, sclerosant was administered (in a mixture of 2 cc of Lipiodol, 4 cc of STS and 6 cc of air) under fluoroscopic guidance. Multiple spot fluoroscopic and radiographic images were obtained during the sclerosant administration. Once sclerosis and was noted into the proximal aspect of the splenorenal shunt, the inferior balloon was inflated. At this time multiple overlapping Ruby coils were deployed within the left renal vein and subsequently within the caudal aspect of the splenorenral shunt. A completion left renal venogram was performed and the procedure was terminated. Follow-up splenic venogram confirms no further flow through the gastric variceal complex. Good antegrade flow through the TIPS shunt. Continued hepatopetal antegrade perfusion through the left portal vein. All wires, catheters and sheaths were removed from the patient. Hemostasis was achieved at the right IJ and groin access sites with manual compression. A dressing was placed. The patient tolerated the procedure well. Patient transferred to the PACU. Operators:  Henn/ Hassell/ Watts IMPRESSION: 1. Successful creation of a TIPS with reduction of portosystemic gradient to 3 mmHg. 2. Successful ultrasound-guided paracentesis yielding 13 L of ascitic fluid. PLAN: - Continued in-patient management as per the providing clinical service. - Followup post-BATO abd CT  when clinically stable - Patient be seen in follow-up consultation at the  interventional radiology clinic with postprocedural TIPS ultrasound and labs (CMP, INR and ammonia levels) in 4-6 weeks. Electronically Signed   By: Lucrezia Europe M.D.   On: 04/05/2017 11:43   Ir Angiogram Selective Each Additional Vessel  Result Date: 04/05/2017 CLINICAL DATA:  Cirrhosis with gastric varices. Acute hemorrhage and hemodynamic instability. See previous consultation by Dr. Anselm Pancoast. EXAM: 1. TRANSJUGULAR INTRAHEPATIC PORTOSYSTEMIC SHUNT 2. IR PARACENTESIS 1. ULTRASOUND GUIDANCE FOR VENOUS ACCESS x2 2. TRANSJUGLAR INTRHEPATIC PORTOSYSTEMIC SHUNT (TIPS) CREATION 3. SELECTIVE GASTRIC VARICEAL VENOGRAM 4. PERCUTANEOUS SCLEROSANT EMBOLIZATION ANESTHESIA/SEDATION: General - as administered by the Anesthesia department MEDICATIONS: Sodium Tetradecyl Sulfate 13m, lipiodol 231miv CONTRAST:  20063mSOVUE-300 IOPAMIDOL (ISOVUE-300) INJECTION 61% FLUOROSCOPY TIME:  Fluoroscopy Time: 61 min 36 sec 1.27.425 COMPLICATIONS: None immediate. PROCEDURE: Informed written consent was obtained from the family after a thorough discussion of the procedural risks, benefits and alternatives. All questions were addressed. Maximal Sterile Barrier Technique was utilized including caps, mask, sterile gowns, sterile gloves, sterile drape, hand hygiene and skin antiseptic. A timeout was performed prior to the initiation of the procedure. The skin overlying the right upper abdominal quadrant as well as the right neck and femoral region were prepped and draped in usual sterile fashion. Initial ultrasound scanning demonstrates a small amount of perihepatic ascites ; no paracentesis was indicated. Under direct ultrasound guidance, a peripheral aspect of the right portal vein was accessed with a 22 gauge needle. Ultrasound image was saved for procedural documentation purposes. The track was dilated with the inner 3 French catheter from the [place]. Several contrast limited portal venograms were performed. A Nitrex wire was advanced  through the 3 FrePakistantheter with the radiopaque transition positioned at the target entrance to the peripheral right portal vein. Next,  the right internal jugular vein was accessed under direct ultrasound. Ultrasound image was saved for procedural documentation purposes. This allowed for placement of the 10 French TIPS vascular sheath. With the aid of angiographic guidewires, an MPA catheter was utilized to select the right hepatic vein and a hepatic venogram was performed. Under fluoroscopic guidance, the right portal vein with targeted with a Rsch-Uchida TIPS needle directed at the radiopaque target within the right portal vein. Ultimately, the right portal vein was accessed at a desirable location allowing advancement of a stiff Glidewire into the main portal vein. A 4 French glide catheter was advanced over the stiff Glidewire and a portal venogram was performed. Next, as there was difficulty advancing a measuring catheter through the intrahepatic track, the track was dilated with a 4 mm Mustang balloon, allowing advancement measuring pigtail into the right portal vein. A portal venogram was performed with a measuring Omni Flush catheter. Tract further dilated with 8 mm Conquest balloon to facilitate advancement of the sheath into the main portal vein. The percutaneous portal microcatheter and wire were removed. Next, a 2 cm (un covered) x 6 cm (covered) x 10 mm GORE VIATORR TIPS Endoprosthesis was advanced through the intra portal track and deployed. The TIPS stent was angioplastied in multiple stations to 10 mm diameter. Follow-up portal venogram demonstrated good flow, no residual stenosis. There is continued retrograde collateral flow through dilated left gastric vein to supply the gastric varices, ultimately draining for retroperitoneal collaterals into the left renal vein. A 5 French angiographic catheter was advanced into the dilated left gastric vein with the aid of an Amplatz Glidewire. Under  direct ultrasound guidance, the right common femoral vein was accessed with a micropuncture kit. An ultrasound image was saved for procedural documentation purposes. Over a Bentson wire, the track was dilated allowing for placement of a 10 French TIPS sheath to the level of the mid IVC under intermittent fluoroscopic guidance. A Cobra catheter was utilized to select the left renal vein and a left renal venogram was performed. A Rosen wire was advanced into the caudal pole of the left kidney as the access sheath was advanced to the origin of left renal vein. Next, a 4 French angled glide catheter was cannulated within the sheath, next to the [place] wire, and with the use of a regular glidewire, was utilized to select the outflow origin of the hypertrophied gastric varix. The Rosen wire was then removed and over a coiled Amplatz wire, the vascular sheath was advanced into the outflow of the splenorenal shunt. Contrast injection confirmed appropriate positioning. The left gastric vein catheter was exchanged for a 100 cm Renato Battles scientific occlusion balloon. A 65 cm [place] scientific occlusion balloon was then advanced into the caudal aspect of the outflow of the splenorenal shunt. Inflation of the left gastric balloon and subsequent injection confirmed stasis of flow in the gastric varix complex. Inflation of the splenorenal shunt balloon pulled caudal to near vessel's confluence with the left renal veinwith subsequent left gastric injection confirmed stasis of flow in the gastric varix complex. Next, a Lantern micro catheter was advanced through the splenorenal occlusion balloon into the caudal aspect of the markedly hypertrophied shunt. Venography confirmed appropriate positioning. A second Lantern microcatheter was advanced through the left gastric venous occlusion balloon into the left gastric vein. The left gastric occlusion balloon was inflated. At this time, sclerosant was administered (in a mixture of 2 cc of  Lipiodol, 4 cc of STS and 6 cc of air) under  fluoroscopic guidance. Multiple spot fluoroscopic and radiographic images were obtained during the sclerosant administration. Once sclerosis and was noted into the proximal aspect of the splenorenal shunt, the inferior balloon was inflated. At this time multiple overlapping Ruby coils were deployed within the left renal vein and subsequently within the caudal aspect of the splenorenral shunt. A completion left renal venogram was performed and the procedure was terminated. Follow-up splenic venogram confirms no further flow through the gastric variceal complex. Good antegrade flow through the TIPS shunt. Continued hepatopetal antegrade perfusion through the left portal vein. All wires, catheters and sheaths were removed from the patient. Hemostasis was achieved at the right IJ and groin access sites with manual compression. A dressing was placed. The patient tolerated the procedure well. Patient transferred to the PACU. Operators:  Henn/ Hassell/ Watts IMPRESSION: 1. Successful creation of a TIPS with reduction of portosystemic gradient to 3 mmHg. 2. Successful ultrasound-guided paracentesis yielding 13 L of ascitic fluid. PLAN: - Continued in-patient management as per the providing clinical service. - Followup post-BATO abd CT  when clinically stable - Patient be seen in follow-up consultation at the interventional radiology clinic with postprocedural TIPS ultrasound and labs (CMP, INR and ammonia levels) in 4-6 weeks. Electronically Signed   By: Lucrezia Europe M.D.   On: 04/05/2017 11:43   Ir Venogram Renal Uni Right  Result Date: 04/05/2017 CLINICAL DATA:  Cirrhosis with gastric varices. Acute hemorrhage and hemodynamic instability. See previous consultation by Dr. Anselm Pancoast. EXAM: 1. TRANSJUGULAR INTRAHEPATIC PORTOSYSTEMIC SHUNT 2. IR PARACENTESIS 1. ULTRASOUND GUIDANCE FOR VENOUS ACCESS x2 2. TRANSJUGLAR INTRHEPATIC PORTOSYSTEMIC SHUNT (TIPS) CREATION 3. SELECTIVE GASTRIC  VARICEAL VENOGRAM 4. PERCUTANEOUS SCLEROSANT EMBOLIZATION ANESTHESIA/SEDATION: General - as administered by the Anesthesia department MEDICATIONS: Sodium Tetradecyl Sulfate 83m, lipiodol 268miv CONTRAST:  20054mSOVUE-300 IOPAMIDOL (ISOVUE-300) INJECTION 61% FLUOROSCOPY TIME:  Fluoroscopy Time: 61 min 36 sec 1.20.630 COMPLICATIONS: None immediate. PROCEDURE: Informed written consent was obtained from the family after a thorough discussion of the procedural risks, benefits and alternatives. All questions were addressed. Maximal Sterile Barrier Technique was utilized including caps, mask, sterile gowns, sterile gloves, sterile drape, hand hygiene and skin antiseptic. A timeout was performed prior to the initiation of the procedure. The skin overlying the right upper abdominal quadrant as well as the right neck and femoral region were prepped and draped in usual sterile fashion. Initial ultrasound scanning demonstrates a small amount of perihepatic ascites ; no paracentesis was indicated. Under direct ultrasound guidance, a peripheral aspect of the right portal vein was accessed with a 22 gauge needle. Ultrasound image was saved for procedural documentation purposes. The track was dilated with the inner 3 French catheter from the [place]. Several contrast limited portal venograms were performed. A Nitrex wire was advanced through the 3 FrePakistantheter with the radiopaque transition positioned at the target entrance to the peripheral right portal vein. Next, the right internal jugular vein was accessed under direct ultrasound. Ultrasound image was saved for procedural documentation purposes. This allowed for placement of the 10 French TIPS vascular sheath. With the aid of angiographic guidewires, an MPA catheter was utilized to select the right hepatic vein and a hepatic venogram was performed. Under fluoroscopic guidance, the right portal vein with targeted with a Rsch-Uchida TIPS needle directed at the  radiopaque target within the right portal vein. Ultimately, the right portal vein was accessed at a desirable location allowing advancement of a stiff Glidewire into the main portal vein. A 4 French glide catheter was  advanced over the stiff Glidewire and a portal venogram was performed. Next, as there was difficulty advancing a measuring catheter through the intrahepatic track, the track was dilated with a 4 mm Mustang balloon, allowing advancement measuring pigtail into the right portal vein. A portal venogram was performed with a measuring Omni Flush catheter. Tract further dilated with 8 mm Conquest balloon to facilitate advancement of the sheath into the main portal vein. The percutaneous portal microcatheter and wire were removed. Next, a 2 cm (un covered) x 6 cm (covered) x 10 mm GORE VIATORR TIPS Endoprosthesis was advanced through the intra portal track and deployed. The TIPS stent was angioplastied in multiple stations to 10 mm diameter. Follow-up portal venogram demonstrated good flow, no residual stenosis. There is continued retrograde collateral flow through dilated left gastric vein to supply the gastric varices, ultimately draining for retroperitoneal collaterals into the left renal vein. A 5 French angiographic catheter was advanced into the dilated left gastric vein with the aid of an Amplatz Glidewire. Under direct ultrasound guidance, the right common femoral vein was accessed with a micropuncture kit. An ultrasound image was saved for procedural documentation purposes. Over a Bentson wire, the track was dilated allowing for placement of a 10 French TIPS sheath to the level of the mid IVC under intermittent fluoroscopic guidance. A Cobra catheter was utilized to select the left renal vein and a left renal venogram was performed. A Rosen wire was advanced into the caudal pole of the left kidney as the access sheath was advanced to the origin of left renal vein. Next, a 4 French angled glide  catheter was cannulated within the sheath, next to the [place] wire, and with the use of a regular glidewire, was utilized to select the outflow origin of the hypertrophied gastric varix. The Rosen wire was then removed and over a coiled Amplatz wire, the vascular sheath was advanced into the outflow of the splenorenal shunt. Contrast injection confirmed appropriate positioning. The left gastric vein catheter was exchanged for a 100 cm Renato Battles scientific occlusion balloon. A 65 cm [place] scientific occlusion balloon was then advanced into the caudal aspect of the outflow of the splenorenal shunt. Inflation of the left gastric balloon and subsequent injection confirmed stasis of flow in the gastric varix complex. Inflation of the splenorenal shunt balloon pulled caudal to near vessel's confluence with the left renal veinwith subsequent left gastric injection confirmed stasis of flow in the gastric varix complex. Next, a Lantern micro catheter was advanced through the splenorenal occlusion balloon into the caudal aspect of the markedly hypertrophied shunt. Venography confirmed appropriate positioning. A second Lantern microcatheter was advanced through the left gastric venous occlusion balloon into the left gastric vein. The left gastric occlusion balloon was inflated. At this time, sclerosant was administered (in a mixture of 2 cc of Lipiodol, 4 cc of STS and 6 cc of air) under fluoroscopic guidance. Multiple spot fluoroscopic and radiographic images were obtained during the sclerosant administration. Once sclerosis and was noted into the proximal aspect of the splenorenal shunt, the inferior balloon was inflated. At this time multiple overlapping Ruby coils were deployed within the left renal vein and subsequently within the caudal aspect of the splenorenral shunt. A completion left renal venogram was performed and the procedure was terminated. Follow-up splenic venogram confirms no further flow through the gastric  variceal complex. Good antegrade flow through the TIPS shunt. Continued hepatopetal antegrade perfusion through the left portal vein. All wires, catheters and sheaths were removed from  the patient. Hemostasis was achieved at the right IJ and groin access sites with manual compression. A dressing was placed. The patient tolerated the procedure well. Patient transferred to the PACU. Operators:  Henn/ Hassell/ Watts IMPRESSION: 1. Successful creation of a TIPS with reduction of portosystemic gradient to 3 mmHg. 2. Successful ultrasound-guided paracentesis yielding 13 L of ascitic fluid. PLAN: - Continued in-patient management as per the providing clinical service. - Followup post-BATO abd CT  when clinically stable - Patient be seen in follow-up consultation at the interventional radiology clinic with postprocedural TIPS ultrasound and labs (CMP, INR and ammonia levels) in 4-6 weeks. Electronically Signed   By: Lucrezia Europe M.D.   On: 04/05/2017 11:43   Ir Tips  Result Date: 04/05/2017 CLINICAL DATA:  Cirrhosis with gastric varices. Acute hemorrhage and hemodynamic instability. See previous consultation by Dr. Anselm Pancoast. EXAM: 1. TRANSJUGULAR INTRAHEPATIC PORTOSYSTEMIC SHUNT 2. IR PARACENTESIS 1. ULTRASOUND GUIDANCE FOR VENOUS ACCESS x2 2. TRANSJUGLAR INTRHEPATIC PORTOSYSTEMIC SHUNT (TIPS) CREATION 3. SELECTIVE GASTRIC VARICEAL VENOGRAM 4. PERCUTANEOUS SCLEROSANT EMBOLIZATION ANESTHESIA/SEDATION: General - as administered by the Anesthesia department MEDICATIONS: Sodium Tetradecyl Sulfate 20m, lipiodol 240miv CONTRAST:  20059mSOVUE-300 IOPAMIDOL (ISOVUE-300) INJECTION 61% FLUOROSCOPY TIME:  Fluoroscopy Time: 61 min 36 sec [id number] COMPLICATIONS: None immediate. PROCEDURE: Informed written consent was obtained from the family after a thorough discussion of the procedural risks, benefits and alternatives. All questions were addressed. Maximal Sterile Barrier Technique was utilized including caps, mask, sterile gowns,  sterile gloves, sterile drape, hand hygiene and skin antiseptic. A timeout was performed prior to the initiation of the procedure. The skin overlying the right upper abdominal quadrant as well as the right neck and femoral region were prepped and draped in usual sterile fashion. Initial ultrasound scanning demonstrates a small amount of perihepatic ascites ; no paracentesis was indicated. Under direct ultrasound guidance, a peripheral aspect of the right portal vein was accessed with a 22 gauge needle. Ultrasound image was saved for procedural documentation purposes. The track was dilated with the inner 3 French catheter from the AccCascadet. Several contrast limited portal venograms were performed. A Nitrex wire was advanced through the 3 FrePakistantheter with the radiopaque transition positioned at the target entrance to the peripheral right portal vein. Next, the right internal jugular vein was accessed under direct ultrasound. Ultrasound image was saved for procedural documentation purposes. This allowed for placement of the 10 French TIPS vascular sheath. With the aid of angiographic guidewires, an MPA catheter was utilized to select the right hepatic vein and a hepatic venogram was performed. Under fluoroscopic guidance, the right portal vein with targeted with a Rsch-Uchida TIPS needle directed at the radiopaque target within the right portal vein. Ultimately, the right portal vein was accessed at a desirable location allowing advancement of a stiff Glidewire into the main portal vein. A 4 French glide catheter was advanced over the stiff Glidewire and a portal venogram was performed. Next, as there was difficulty advancing a measuring catheter through the intrahepatic track, the track was dilated with a 4 mm Mustang balloon, allowing advancement measuring pigtail into the right portal vein. A portal venogram was performed with a measuring Omni Flush catheter. Tract further dilated with 8 mm Conquest  balloon to facilitate advancement of the sheath into the main portal vein. The percutaneous portal microcatheter and wire were removed. Next, a 2 cm (un covered) x 6 cm (covered) x 10 mm GORE VIATORR TIPS Endoprosthesis was advanced through the intra portal track and deployed.  The TIPS stent was angioplastied in multiple stations to 10 mm diameter. Follow-up portal venogram demonstrated good flow, no residual stenosis. There is continued retrograde collateral flow through dilated left gastric vein to supply the gastric varices, ultimately draining for retroperitoneal collaterals into the left renal vein. A 5 French angiographic catheter was advanced into the dilated left gastric vein with the aid of an Amplatz Glidewire. Under direct ultrasound guidance, the right common femoral vein was accessed with a micropuncture kit. An ultrasound image was saved for procedural documentation purposes. Over a Bentson wire, the track was dilated allowing for placement of a 10 French TIPS sheath to the level of the mid IVC under intermittent fluoroscopic guidance. A Cobra catheter was utilized to select the left renal vein and a left renal venogram was performed. A Rosen wire was advanced into the caudal pole of the left kidney as the access sheath was advanced to the origin of left renal vein. Next, a 4 French angled glide catheter was cannulated within the sheath, next to the [place] wire, and with the use of a regular glidewire, was utilized to select the outflow origin of the hypertrophied gastric varix. The Rosen wire was then removed and over a coiled Amplatz wire, the vascular sheath was advanced into the outflow of the splenorenal shunt. Contrast injection confirmed appropriate positioning. The left gastric vein catheter was exchanged for a 100 cm Renato Battles scientific occlusion balloon. A 65 cm [place] scientific occlusion balloon was then advanced into the caudal aspect of the outflow of the splenorenal shunt. Inflation of  the left gastric balloon and subsequent injection confirmed stasis of flow in the gastric varix complex. Inflation of the splenorenal shunt balloon pulled caudal to near vessel's confluence with the left renal veinwith subsequent left gastric injection confirmed stasis of flow in the gastric varix complex. Next, a Lantern micro catheter was advanced through the splenorenal occlusion balloon into the caudal aspect of the markedly hypertrophied shunt. Venography confirmed appropriate positioning. A second Lantern microcatheter was advanced through the left gastric venous occlusion balloon into the left gastric vein. The left gastric occlusion balloon was inflated. At this time, sclerosant was administered (in a mixture of 2 cc of Lipiodol, 4 cc of STS and 6 cc of air) under fluoroscopic guidance. Multiple spot fluoroscopic and radiographic images were obtained during the sclerosant administration. Once sclerosis and was noted into the proximal aspect of the splenorenal shunt, the inferior balloon was inflated. At this time multiple overlapping Ruby coils were deployed within the left renal vein and subsequently within the caudal aspect of the splenorenral shunt. A completion left renal venogram was performed and the procedure was terminated. Follow-up splenic venogram confirms no further flow through the gastric variceal complex. Good antegrade flow through the TIPS shunt. Continued hepatopetal antegrade perfusion through the left portal vein. All wires, catheters and sheaths were removed from the patient. Hemostasis was achieved at the right IJ and groin access sites with manual compression. A dressing was placed. The patient tolerated the procedure well. Patient transferred to the PACU. Operators:  Henn/ Hassell/ Watts IMPRESSION: 1. Successful creation of a TIPS with reduction of portosystemic gradient to 3 mmHg. 2. Successful ultrasound-guided paracentesis yielding 13 L of ascitic fluid. PLAN: - Continued  in-patient management as per the providing clinical service. - Followup post-BATO abd CT  when clinically stable - Patient be seen in follow-up consultation at the interventional radiology clinic with postprocedural TIPS ultrasound and labs (CMP, INR and ammonia levels) in 4-6 weeks.  Electronically Signed   By: Lucrezia Europe M.D.   On: 04/05/2017 11:43   Ir US Guide Vasc Access Right  Result Date: 04/05/2017 CLINICAL DATA:  Cirrhosis with gastric varices. Acute hemorrhage and hemodynamic instability. See previous consultation by Dr. Anselm Pancoast. EXAM: 1. TRANSJUGULAR INTRAHEPATIC PORTOSYSTEMIC SHUNT 2. IR PARACENTESIS 1. ULTRASOUND GUIDANCE FOR VENOUS ACCESS x2 2. TRANSJUGLAR INTRHEPATIC PORTOSYSTEMIC SHUNT (TIPS) CREATION 3. SELECTIVE GASTRIC VARICEAL VENOGRAM 4. PERCUTANEOUS SCLEROSANT EMBOLIZATION ANESTHESIA/SEDATION: General - as administered by the Anesthesia department MEDICATIONS: Sodium Tetradecyl Sulfate 33m, lipiodol 239miv CONTRAST:  20011mSOVUE-300 IOPAMIDOL (ISOVUE-300) INJECTION 61% FLUOROSCOPY TIME:  Fluoroscopy Time: 61 min 36 sec [id number] COMPLICATIONS: None immediate. PROCEDURE: Informed written consent was obtained from the family after a thorough discussion of the procedural risks, benefits and alternatives. All questions were addressed. Maximal Sterile Barrier Technique was utilized including caps, mask, sterile gowns, sterile gloves, sterile drape, hand hygiene and skin antiseptic. A timeout was performed prior to the initiation of the procedure. The skin overlying the right upper abdominal quadrant as well as the right neck and femoral region were prepped and draped in usual sterile fashion. Initial ultrasound scanning demonstrates a small amount of perihepatic ascites ; no paracentesis was indicated. Under direct ultrasound guidance, a peripheral aspect of the right portal vein was accessed with a 22 gauge needle. Ultrasound image was saved for procedural documentation purposes. The track was  dilated with the inner 3 French catheter from the AccNiotazet. Several contrast limited portal venograms were performed. A Nitrex wire was advanced through the 3 FrePakistantheter with the radiopaque transition positioned at the target entrance to the peripheral right portal vein. Next, the right internal jugular vein was accessed under direct ultrasound. Ultrasound image was saved for procedural documentation purposes. This allowed for placement of the 10 French TIPS vascular sheath. With the aid of angiographic guidewires, an MPA catheter was utilized to select the right hepatic vein and a hepatic venogram was performed. Under fluoroscopic guidance, the right portal vein with targeted with a Rsch-Uchida TIPS needle directed at the radiopaque target within the right portal vein. Ultimately, the right portal vein was accessed at a desirable location allowing advancement of a stiff Glidewire into the main portal vein. A 4 French glide catheter was advanced over the stiff Glidewire and a portal venogram was performed. Next, as there was difficulty advancing a measuring catheter through the intrahepatic track, the track was dilated with a 4 mm Mustang balloon, allowing advancement measuring pigtail into the right portal vein. A portal venogram was performed with a measuring Omni Flush catheter. Tract further dilated with 8 mm Conquest balloon to facilitate advancement of the sheath into the main portal vein. The percutaneous portal microcatheter and wire were removed. Next, a 2 cm (un covered) x 6 cm (covered) x 10 mm GORE VIATORR TIPS Endoprosthesis was advanced through the intra portal track and deployed. The TIPS stent was angioplastied in multiple stations to 10 mm diameter. Follow-up portal venogram demonstrated good flow, no residual stenosis. There is continued retrograde collateral flow through dilated left gastric vein to supply the gastric varices, ultimately draining for retroperitoneal collaterals into  the left renal vein. A 5 French angiographic catheter was advanced into the dilated left gastric vein with the aid of an Amplatz Glidewire. Under direct ultrasound guidance, the right common femoral vein was accessed with a micropuncture kit. An ultrasound image was saved for procedural documentation purposes. Over a Bentson wire, the track was dilated allowing for placement of  a 10 [place] TIPS sheath to the level of the mid IVC under intermittent fluoroscopic guidance. A Cobra catheter was utilized to select the left renal vein and a left renal venogram was performed. A Rosen wire was advanced into the caudal pole of the left kidney as the access sheath was advanced to the origin of left renal vein. Next, a 4 French angled glide catheter was cannulated within the sheath, next to the [place] wire, and with the use of a regular glidewire, was utilized to select the outflow origin of the hypertrophied gastric varix. The Rosen wire was then removed and over a coiled Amplatz wire, the vascular sheath was advanced into the outflow of the splenorenal shunt. Contrast injection confirmed appropriate positioning. The left gastric vein catheter was exchanged for a 100 cm Renato Battles scientific occlusion balloon. A 65 cm [place] scientific occlusion balloon was then advanced into the caudal aspect of the outflow of the splenorenal shunt. Inflation of the left gastric balloon and subsequent injection confirmed stasis of flow in the gastric varix complex. Inflation of the splenorenal shunt balloon pulled caudal to near vessel's confluence with the left renal veinwith subsequent left gastric injection confirmed stasis of flow in the gastric varix complex. Next, a Lantern micro catheter was advanced through the splenorenal occlusion balloon into the caudal aspect of the markedly hypertrophied shunt. Venography confirmed appropriate positioning. A second Lantern microcatheter was advanced through the left gastric venous occlusion balloon  into the left gastric vein. The left gastric occlusion balloon was inflated. At this time, sclerosant was administered (in a mixture of 2 cc of Lipiodol, 4 cc of STS and 6 cc of air) under fluoroscopic guidance. Multiple spot fluoroscopic and radiographic images were obtained during the sclerosant administration. Once sclerosis and was noted into the proximal aspect of the splenorenal shunt, the inferior balloon was inflated. At this time multiple overlapping Ruby coils were deployed within the left renal vein and subsequently within the caudal aspect of the splenorenral shunt. A completion left renal venogram was performed and the procedure was terminated. Follow-up splenic venogram confirms no further flow through the gastric variceal complex. Good antegrade flow through the TIPS shunt. Continued hepatopetal antegrade perfusion through the left portal vein. All wires, catheters and sheaths were removed from the patient. Hemostasis was achieved at the right IJ and groin access sites with manual compression. A dressing was placed. The patient tolerated the procedure well. Patient transferred to the PACU. Operators:  Henn/ Hassell/ Watts IMPRESSION: 1. Successful creation of a TIPS with reduction of portosystemic gradient to 3 mmHg. 2. Successful ultrasound-guided paracentesis yielding 13 L of ascitic fluid. PLAN: - Continued in-patient management as per the providing clinical service. - Followup post-BATO abd CT  when clinically stable - Patient be seen in follow-up consultation at the interventional radiology clinic with postprocedural TIPS ultrasound and labs (CMP, INR and ammonia levels) in 4-6 weeks. Electronically Signed   By: Lucrezia Europe M.D.   On: 04/05/2017 11:43   Dg Chest Port 1 View  Result Date: 04/05/2017 CLINICAL DATA:  Respiratory failure. EXAM: PORTABLE CHEST 1 VIEW COMPARISON:  04/04/2017.  03/29/2017. FINDINGS: Endotracheal tube and right IJ line in stable position. Heart size normal. Diffuse  severe bilateral pulmonary interstitial prominence consistent with interstitial edema and/or pneumonitis again noted without interim change. Small left pleural effusion. Biapical pleural thickening noted consistent scarring. Surgical coils noted in the upper abdomen. IMPRESSION: 1.  Lines and tubes in stable position. 2. Diffuse severe bilateral from interstitial prominence  again noted consistent with interstitial edema and/or pneumonitis. No interim change. Small left pleural effusion. Electronically Signed   By: Marcello Moores  Register   On: 04/05/2017 07:01   Dg Chest Port 1 View  Result Date: 04/04/2017 CLINICAL DATA:  Status post endotracheal intubation. EXAM: PORTABLE CHEST 1 VIEW COMPARISON:  Radiographs of Apr 02, 2017. FINDINGS: The heart size and mediastinal contours are within normal limits. Endotracheal tube is seen projected over tracheal air shadow with distal tip 7.5 cm above the carina. Right internal jugular catheter is noted with distal tip in expected position of the SVC. No pneumothorax is noted. There is interval development of diffuse reticulonodular opacities throughout both lungs most consistent with inflammation or pneumonia or possibly edema. Mild right pleural effusion is noted. The visualized skeletal structures are unremarkable. IMPRESSION: Endotracheal tube and right internal jugular venous catheter in grossly good position. Interval development of bilateral diffuse lung opacities as described above, most consistent with inflammation or pneumonia, or less likely edema. Mild right pleural effusion is noted. Electronically Signed   By: Marijo Conception, M.D.   On: 04/04/2017 11:36   [place] Guide Roadmapping  Result Date: 04/05/2017 CLINICAL DATA:  Cirrhosis with gastric varices. Acute hemorrhage and hemodynamic instability. See previous consultation by Dr. Anselm Pancoast. EXAM: 1. TRANSJUGULAR INTRAHEPATIC PORTOSYSTEMIC SHUNT 2. IR PARACENTESIS 1. ULTRASOUND GUIDANCE  FOR VENOUS ACCESS x2 2. TRANSJUGLAR INTRHEPATIC PORTOSYSTEMIC SHUNT (TIPS) CREATION 3. SELECTIVE GASTRIC VARICEAL VENOGRAM 4. PERCUTANEOUS SCLEROSANT EMBOLIZATION ANESTHESIA/SEDATION: General - as administered by the Anesthesia department MEDICATIONS: Sodium Tetradecyl Sulfate 59m, lipiodol 239miv CONTRAST:  20052mSOVUE-300 IOPAMIDOL (ISOVUE-300) INJECTION 61% FLUOROSCOPY TIME:  Fluoroscopy Time: 61 min 36 sec 1.26.269 COMPLICATIONS: None immediate. PROCEDURE: Informed written consent was obtained from the family after a thorough discussion of the procedural risks, benefits and alternatives. All questions were addressed. Maximal Sterile Barrier Technique was utilized including caps, mask, sterile gowns, sterile gloves, sterile drape, hand hygiene and skin antiseptic. A timeout was performed prior to the initiation of the procedure. The skin overlying the right upper abdominal quadrant as well as the right neck and femoral region were prepped and draped in usual sterile fashion. Initial ultrasound scanning demonstrates a small amount of perihepatic ascites ; no paracentesis was indicated. Under direct ultrasound guidance, a peripheral aspect of the right portal vein was accessed with a 22 gauge needle. Ultrasound image was saved for procedural documentation purposes. The track was dilated with the inner 3 French catheter from the [place]. Several contrast limited portal venograms were performed. A Nitrex wire was advanced through the 3 FrePakistantheter with the radiopaque transition positioned at the target entrance to the peripheral right portal vein. Next, the right internal jugular vein was accessed under direct ultrasound. Ultrasound image was saved for procedural documentation purposes. This allowed for placement of the 10 French TIPS vascular sheath. With the aid of angiographic guidewires, an MPA catheter was utilized to select the right hepatic vein and a hepatic venogram was performed. Under  fluoroscopic guidance, the right portal vein with targeted with a Rsch-Uchida TIPS needle directed at the radiopaque target within the right portal vein. Ultimately, the right portal vein was accessed at a desirable location allowing advancement of a stiff Glidewire into the main portal vein. A 4 French glide catheter was advanced over the stiff Glidewire and a portal venogram was performed. Next, as there was difficulty advancing a measuring catheter through the intrahepatic track, the track was  dilated with a 4 mm Mustang balloon, allowing advancement measuring pigtail into the right portal vein. A portal venogram was performed with a measuring Omni Flush catheter. Tract further dilated with 8 mm Conquest balloon to facilitate advancement of the sheath into the main portal vein. The percutaneous portal microcatheter and wire were removed. Next, a 2 cm (un covered) x 6 cm (covered) x 10 mm GORE VIATORR TIPS Endoprosthesis was advanced through the intra portal track and deployed. The TIPS stent was angioplastied in multiple stations to 10 mm diameter. Follow-up portal venogram demonstrated good flow, no residual stenosis. There is continued retrograde collateral flow through dilated left gastric vein to supply the gastric varices, ultimately draining for retroperitoneal collaterals into the left renal vein. A 5 French angiographic catheter was advanced into the dilated left gastric vein with the aid of an Amplatz Glidewire. Under direct ultrasound guidance, the right common femoral vein was accessed with a micropuncture kit. An ultrasound image was saved for procedural documentation purposes. Over a Bentson wire, the track was dilated allowing for placement of a 10 French TIPS sheath to the level of the mid IVC under intermittent fluoroscopic guidance. A Cobra catheter was utilized to select the left renal vein and a left renal venogram was performed. A Rosen wire was advanced into the caudal pole of the left  kidney as the access sheath was advanced to the origin of left renal vein. Next, a 4 French angled glide catheter was cannulated within the sheath, next to the [place] wire, and with the use of a regular glidewire, was utilized to select the outflow origin of the hypertrophied gastric varix. The Rosen wire was then removed and over a coiled Amplatz wire, the vascular sheath was advanced into the outflow of the splenorenal shunt. Contrast injection confirmed appropriate positioning. The left gastric vein catheter was exchanged for a 100 cm Renato Battles scientific occlusion balloon. A 65 cm [place] scientific occlusion balloon was then advanced into the caudal aspect of the outflow of the splenorenal shunt. Inflation of the left gastric balloon and subsequent injection confirmed stasis of flow in the gastric varix complex. Inflation of the splenorenal shunt balloon pulled caudal to near vessel's confluence with the left renal veinwith subsequent left gastric injection confirmed stasis of flow in the gastric varix complex. Next, a Lantern micro catheter was advanced through the splenorenal occlusion balloon into the caudal aspect of the markedly hypertrophied shunt. Venography confirmed appropriate positioning. A second Lantern microcatheter was advanced through the left gastric venous occlusion balloon into the left gastric vein. The left gastric occlusion balloon was inflated. At this time, sclerosant was administered (in a mixture of 2 cc of Lipiodol, 4 cc of STS and 6 cc of air) under fluoroscopic guidance. Multiple spot fluoroscopic and radiographic images were obtained during the sclerosant administration. Once sclerosis and was noted into the proximal aspect of the splenorenal shunt, the inferior balloon was inflated. At this time multiple overlapping Ruby coils were deployed within the left renal vein and subsequently within the caudal aspect of the splenorenral shunt. A completion left renal venogram was performed and  the procedure was terminated. Follow-up splenic venogram confirms no further flow through the gastric variceal complex. Good antegrade flow through the TIPS shunt. Continued hepatopetal antegrade perfusion through the left portal vein. All wires, catheters and sheaths were removed from the patient. Hemostasis was achieved at the right IJ and groin access sites with manual compression. A dressing was placed. The patient tolerated the procedure well. Patient  transferred to the PACU. Operators:  Henn/ Hassell/ Watts IMPRESSION: 1. Successful creation of a TIPS with reduction of portosystemic gradient to 3 mmHg. 2. Successful ultrasound-guided paracentesis yielding 13 L of ascitic fluid. PLAN: - Continued in-patient management as per the providing clinical service. - Followup post-BATO abd CT  when clinically stable - Patient be seen in follow-up consultation at the interventional radiology clinic with postprocedural TIPS ultrasound and labs (CMP, INR and ammonia levels) in 4-6 weeks. Electronically Signed   By: Lucrezia Europe M.D.   On: 04/05/2017 11:43   [place] Guide Roadmapping  Result Date: 04/05/2017 CLINICAL DATA:  Cirrhosis with gastric varices. Acute hemorrhage and hemodynamic instability. See previous consultation by Dr. Anselm Pancoast. EXAM: 1. TRANSJUGULAR INTRAHEPATIC PORTOSYSTEMIC SHUNT 2. IR PARACENTESIS 1. ULTRASOUND GUIDANCE FOR VENOUS ACCESS x2 2. TRANSJUGLAR INTRHEPATIC PORTOSYSTEMIC SHUNT (TIPS) CREATION 3. SELECTIVE GASTRIC VARICEAL VENOGRAM 4. PERCUTANEOUS SCLEROSANT EMBOLIZATION ANESTHESIA/SEDATION: General - as administered by the Anesthesia department MEDICATIONS: Sodium Tetradecyl Sulfate 42m, lipiodol 226miv CONTRAST:  [id number]-300 IOPAMIDOL (ISOVUE-300) INJECTION 61% FLUOROSCOPY TIME:  Fluoroscopy Time: 61 min 36 sec 1.29.518 COMPLICATIONS: None immediate. PROCEDURE: Informed written consent was obtained from the family after a thorough discussion of the procedural  risks, benefits and alternatives. All questions were addressed. Maximal Sterile Barrier Technique was utilized including caps, mask, sterile gowns, sterile gloves, sterile drape, hand hygiene and skin antiseptic. A timeout was performed prior to the initiation of the procedure. The skin overlying the right upper abdominal quadrant as well as the right neck and femoral region were prepped and draped in usual sterile fashion. Initial ultrasound scanning demonstrates a small amount of perihepatic ascites ; no paracentesis was indicated. Under direct ultrasound guidance, a peripheral aspect of the right portal vein was accessed with a 22 gauge needle. Ultrasound image was saved for procedural documentation purposes. The track was dilated with the inner 3 French catheter from the [place]. Several contrast limited portal venograms were performed. A Nitrex wire was advanced through the 3 FrePakistantheter with the radiopaque transition positioned at the target entrance to the peripheral right portal vein. Next, the right internal jugular vein was accessed under direct ultrasound. Ultrasound image was saved for procedural documentation purposes. This allowed for placement of the 10 French TIPS vascular sheath. With the aid of angiographic guidewires, an MPA catheter was utilized to select the right hepatic vein and a hepatic venogram was performed. Under fluoroscopic guidance, the right portal vein with targeted with a Rsch-Uchida TIPS needle directed at the radiopaque target within the right portal vein. Ultimately, the right portal vein was accessed at a desirable location allowing advancement of a stiff Glidewire into the main portal vein. A 4 French glide catheter was advanced over the stiff Glidewire and a portal venogram was performed. Next, as there was difficulty advancing a measuring catheter through the intrahepatic track, the track was dilated with a 4 mm Mustang balloon, allowing advancement measuring  pigtail into the right portal vein. A portal venogram was performed with a measuring Omni Flush catheter. Tract further dilated with 8 mm Conquest balloon to facilitate advancement of the sheath into the main portal vein. The percutaneous portal microcatheter and wire were removed. Next, a 2 cm (un covered) x 6 cm (covered) x 10 mm GORE VIATORR TIPS Endoprosthesis was advanced through the intra portal track and deployed. The TIPS stent was angioplastied in multiple stations to 10 mm diameter. Follow-up portal venogram demonstrated good  flow, no residual stenosis. There is continued retrograde collateral flow through dilated left gastric vein to supply the gastric varices, ultimately draining for retroperitoneal collaterals into the left renal vein. A 5 French angiographic catheter was advanced into the dilated left gastric vein with the aid of an Amplatz Glidewire. Under direct ultrasound guidance, the right common femoral vein was accessed with a micropuncture kit. An ultrasound image was saved for procedural documentation purposes. Over a Bentson wire, the track was dilated allowing for placement of a 10 French TIPS sheath to the level of the mid IVC under intermittent fluoroscopic guidance. A Cobra catheter was utilized to select the left renal vein and a left renal venogram was performed. A Rosen wire was advanced into the caudal pole of the left kidney as the access sheath was advanced to the origin of left renal vein. Next, a 4 French angled glide catheter was cannulated within the sheath, next to the [place] wire, and with the use of a regular glidewire, was utilized to select the outflow origin of the hypertrophied gastric varix. The Rosen wire was then removed and over a coiled Amplatz wire, the vascular sheath was advanced into the outflow of the splenorenal shunt. Contrast injection confirmed appropriate positioning. The left gastric vein catheter was exchanged for a 100 cm Renato Battles scientific occlusion  balloon. A 65 cm Boston scientific occlusion balloon was then advanced into the caudal aspect of the outflow of the splenorenal shunt. Inflation of the left gastric balloon and subsequent injection confirmed stasis of flow in the gastric varix complex. Inflation of the splenorenal shunt balloon pulled caudal to near vessel's confluence with the left renal veinwith subsequent left gastric injection confirmed stasis of flow in the gastric varix complex. Next, a Lantern micro catheter was advanced through the splenorenal occlusion balloon into the caudal aspect of the markedly hypertrophied shunt. Venography confirmed appropriate positioning. A second Lantern microcatheter was advanced through the left gastric venous occlusion balloon into the left gastric vein. The left gastric occlusion balloon was inflated. At this time, sclerosant was administered (in a mixture of 2 cc of Lipiodol, 4 cc of STS and 6 cc of air) under fluoroscopic guidance. Multiple spot fluoroscopic and radiographic images were obtained during the sclerosant administration. Once sclerosis and was noted into the proximal aspect of the splenorenal shunt, the inferior balloon was inflated. At this time multiple overlapping Ruby coils were deployed within the left renal vein and subsequently within the caudal aspect of the splenorenral shunt. A completion left renal venogram was performed and the procedure was terminated. Follow-up splenic venogram confirms no further flow through the gastric variceal complex. Good antegrade flow through the TIPS shunt. Continued hepatopetal antegrade perfusion through the left portal vein. All wires, catheters and sheaths were removed from the patient. Hemostasis was achieved at the right IJ and groin access sites with manual compression. A dressing was placed. The patient tolerated the procedure well. Patient transferred to the PACU. Operators:  Henn/ Hassell/ Watts IMPRESSION: 1. Successful creation of a TIPS with  reduction of portosystemic gradient to 3 mmHg. 2. Successful ultrasound-guided paracentesis yielding 13 L of ascitic fluid. PLAN: - Continued in-patient management as per the providing clinical service. - Followup post-BATO abd CT  when clinically stable - Patient be seen in follow-up consultation at the interventional radiology clinic with postprocedural TIPS ultrasound and labs (CMP, INR and ammonia levels) in 4-6 weeks. Electronically Signed   By: Lucrezia Europe M.D.   On: 04/05/2017 11:43  Patient Profile     66 y.o. male with cirrhosis and gastric varices and recurrent GI bleeding, had circulatory collapse associated with ECG changes suggestive of anterolateral ischemia, resolved after correction of hypotension and anemia using transfusion and pressors. Troponin increased to 33.  Assessment & Plan    1. CAD:  NSTEMI - troponin to 33. There is a new anterior WMA with EF 45-50%. Not a cath candidate or anticoagulation candidate at this time. Continue adequate treatment of anemia and pressors to keep MAP >60.  2. 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 Hemoglobin appears stable.  3. Acute systolic congestive heart failure - LVEF 45-50%, volume overloaded from resuscitation and likely new anterior MI - up 9L today. Creatinine stable. Significant IV fluid intake, despite diuresis. Would increase lasix to 40 mg IV TID.  Pixie Casino, MD, [place]  Attending Cardiologist  Direct Dial: [phone]  Fax: [phone]  Website:  [email or web address]  Pixie Casino, MD  04/06/2017, 8:40 AM

## 2017-04-07 DIAGNOSIS — I8501 Esophageal varices with bleeding: Secondary | ICD-10-CM

## 2017-04-07 DIAGNOSIS — I214 Non-ST elevation (NSTEMI) myocardial infarction: Secondary | ICD-10-CM | POA: Diagnosis present

## 2017-04-07 DIAGNOSIS — I5041 Acute combined systolic (congestive) and diastolic (congestive) heart failure: Secondary | ICD-10-CM | POA: Diagnosis present

## 2017-04-07 DIAGNOSIS — I5043 Acute on chronic combined systolic (congestive) and diastolic (congestive) heart failure: Secondary | ICD-10-CM

## 2017-04-07 LAB — BASIC METABOLIC PANEL
ANION GAP: 9 (ref 5–15)
Anion gap: 8 (ref 5–15)
BUN: 13 mg/dL (ref 6–20)
BUN: 14 mg/dL (ref 6–20)
CALCIUM: 7.5 mg/dL — AB (ref 8.9–10.3)
CALCIUM: 7.7 mg/dL — AB (ref 8.9–10.3)
CHLORIDE: 107 mmol/L (ref 101–111)
CHLORIDE: 109 mmol/L (ref 101–111)
CO2: 26 mmol/L (ref 22–32)
CO2: 26 mmol/L (ref 22–32)
CREATININE: 0.92 mg/dL (ref 0.61–1.24)
Creatinine, Ser: 0.88 mg/dL (ref 0.61–1.24)
GFR calc non Af Amer: >60 mL/min (ref >60)
GFR calc non Af Amer: >60 mL/min (ref >60)
GLUCOSE: 105 mg/dL — AB (ref 65–99)
Glucose, Bld: 118 mg/dL — ABNORMAL HIGH (ref 65–99)
POTASSIUM: 3.2 mmol/L — AB (ref 3.5–5.1)
Potassium: 3.3 mmol/L — ABNORMAL LOW (ref 3.5–5.1)
Sodium: 142 mmol/L (ref 135–145)
Sodium: 143 mmol/L (ref 135–145)

## 2017-04-07 LAB — CBC
HEMATOCRIT: 33.9 % — AB (ref 39.0–52.0)
HEMOGLOBIN: 10.7 g/dL — AB (ref 13.0–17.0)
MCH: 28.5 pg (ref 26.0–34.0)
MCHC: 31.6 g/dL (ref 30.0–36.0)
MCV: 90.4 fL (ref 78.0–100.0)
Platelets: 131 10*3/uL — ABNORMAL LOW (ref 150–400)
RBC: 3.75 MIL/uL — ABNORMAL LOW (ref 4.22–5.81)
RDW: 17.6 % — ABNORMAL HIGH (ref 11.5–15.5)
WBC: 17.4 10*3/uL — AB (ref 4.0–10.5)

## 2017-04-07 LAB — GLUCOSE, CAPILLARY
GLUCOSE-CAPILLARY: 100 mg/dL — AB (ref 65–99)
Glucose-Capillary: 117 mg/dL — ABNORMAL HIGH (ref 65–99)
Glucose-Capillary: 128 mg/dL — ABNORMAL HIGH (ref 65–99)
Glucose-Capillary: 95 mg/dL (ref 65–99)

## 2017-04-07 LAB — AMMONIA: Ammonia: 37 umol/L — ABNORMAL HIGH (ref 9–35)

## 2017-04-07 LAB — MAGNESIUM: Magnesium: 1.9 mg/dL (ref 1.7–2.4)

## 2017-04-07 LAB — HEMOGLOBIN AND HEMATOCRIT, BLOOD
HEMATOCRIT: 34.1 % — AB (ref 39.0–52.0)
Hemoglobin: 10.6 g/dL — ABNORMAL LOW (ref 13.0–17.0)

## 2017-04-07 LAB — PROCALCITONIN: PROCALCITONIN: 1.19 ng/mL

## 2017-04-07 LAB — PHOSPHORUS: PHOSPHORUS: 1.2 mg/dL — AB (ref 2.5–4.6)

## 2017-04-07 MED ORDER — POTASSIUM CHLORIDE 10 MEQ/50ML IV SOLN
10.0000 meq | INTRAVENOUS | Status: AC
  Administered 2017-04-07 (×4): 10 meq via INTRAVENOUS
  Filled 2017-04-07 (×4): qty 50

## 2017-04-07 MED ORDER — POTASSIUM CHLORIDE 20 MEQ/15ML (10%) PO SOLN
40.0000 meq | Freq: Once | ORAL | Status: DC
  Filled 2017-04-07: qty 30

## 2017-04-07 MED ORDER — MAGNESIUM SULFATE 2 GM/50ML IV SOLN
2.0000 g | Freq: Once | INTRAVENOUS | Status: AC
  Administered 2017-04-07: 2 g via INTRAVENOUS
  Filled 2017-04-07: qty 50

## 2017-04-07 MED ORDER — POTASSIUM CHLORIDE 10 MEQ/50ML IV SOLN
10.0000 meq | INTRAVENOUS | Status: AC
  Administered 2017-04-07 (×4): 10 meq via INTRAVENOUS
  Filled 2017-04-07: qty 50

## 2017-04-07 MED ORDER — POTASSIUM PHOSPHATES 15 MMOLE/5ML IV SOLN
30.0000 mmol | Freq: Once | INTRAVENOUS | Status: AC
  Administered 2017-04-07: 30 mmol via INTRAVENOUS
  Filled 2017-04-07: qty 10

## 2017-04-07 NOTE — Progress Notes (Signed)
[facility] Progress Note Patient Name: John Warren DOB: [birth year] MRN: [id number]   Date of Service  04/07/2017  HPI/Events of Note  Hypokalemia, hypophos, and moderately low mag  eICU Interventions  Potassium, phos, and mag replaced     Intervention Category Intermediate Interventions: Electrolyte abnormality - evaluation and management  DETERDING,ELIZABETH 04/07/2017, 4:32 AM

## 2017-04-07 NOTE — Progress Notes (Signed)
[facility] Progress Note Patient Name: John Warren DOB: 07-17-1951 MRN: [id number]   Date of Service  04/07/2017  HPI/Events of Note  K+ = 3.3 and Creatinine = 0.92.  eICU Interventions  Will replace K+.      Intervention Category Major Interventions: Electrolyte abnormality - evaluation and management  [name],Steven Eugene 04/07/2017, 4:42 PM

## 2017-04-07 NOTE — Progress Notes (Signed)
PULMONARY / CRITICAL CARE MEDICINE   Name: John Warren MRN: [id number] DOB: 1951/06/30    ADMISSION DATE:  03/29/2017 CONSULTATION DATE:  04/04/17  REFERRING MD:  Evette Doffing  CHIEF COMPLAINT:  GI Bleed  HISTORY OF PRESENT ILLNESS: Pt is encephelopathic; therefore, this HPI is obtained from chart review. John Warren is a 66 y.o. male with PMH as outlined below including but not limited to EtOH cirrhosis, portal gastropathy, and prior GI bleeds (3 admissions since Jan 2017, previously due to AVM and sigmoid diverticulosis). He was admitted 03/30/17 with recurrent GI bleed that started the day prior.  He was in his usual state of health then had a dark tarry BM followed by BRBPR a few hours later.  He had not been on any NSAID's, antiplatelets, anticoagulants.  He supposedly had quit drinking 1 year ago.  He was admitted and was given PRBC's and FFP and also started on PPI and octreotide.  He had tagged bleeding scan that demonstrated active bleed but it was difficult to localize the source of bleeding. 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  Based on CT findings as well as EGD and colonoscopy (see below), BRTO was recommended and planned for 04/05/17.  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  Capsule endoscopy performed 04/03/17 and demonstrated small non-bleeding AVM in mid small bowel and one non-specific erosion in the small bowel, no evidence of blood in the small bowel or colon (though poor visualization).  Colonoscopy was performed 04/01/17 which demonstrated large amount of fresh and old blood along with clots in the entire colon along with diverticulosis in the sigmoid and descending colon without active bleeding, non bleeding  internal hemorrhoids.  Source of bleeding felt to be gastric varices.  Following colonoscopy (which was performed under MAC anesthesia), pt had worsening hypotension with SBP as low as 60's.  Anesthesia administered 3 bags of albumin (12.5g) and started phenylephrine infusion.  He was also given 3u PRBC's.  CVL and A-line were placed and PCCM was called for further management.  BP responded well to albumin, neo, PRBC's (SBP currently 130's).  Neo was dropped to 71mg and pt was transferred to ICU.  After transfer to ICU, he decompensated further and required intubation due to respiratory insufficiency.    SUBJECTIVE:   No major issues overnight. Levophed weaned off. Just on neosynephrine. Looks comfortable on vent. (-) cp.  (-) further bleeding. Hb and Hct have been stable.    VITAL SIGNS: BP (!) 95/57   Pulse 88   Temp 98.8 F (37.1 C)   Resp (!) 24   Ht 5' 11"  (1.803 m)   Wt 103.3 kg (227 lb 11.8 oz)   SpO2 97%   BMI 31.76 kg/m   HEMODYNAMICS:    VENTILATOR SETTINGS: Vent Mode: PRVC FiO2 (%):  [40 %-50 %] 40 % Set Rate:  [24 bmp] 24 bmp Vt Set:  [600 mL] 600 mL PEEP:  [5 cmH20-8 cmH20] 5 cmH20 Pressure Support:  [5 cmH20] 5 cmH20 Plateau Pressure:  [12 cmH20-19 cmH20] 14 cmH20  INTAKE / OUTPUT: I/O last 3 completed shifts: In: 8595.5 [I.V.:6455.5; Other:1040; IV Piggyback:1100] Out: 8255 [Urine:6180; Stool:2075]   PHYSICAL EXAMINATION: General: Chronically ill appearing male, comfortable.  Neuro: [place]/AT. Sedated. HEENT: [place]/AT.  MMM. Cardiovascular: RRR, no M/R/G.  Lungs: Coarse crackles bases Abdomen: BS x 4, S/NT/ND. Musculoskeletal: No gross deformities.  2+ edema. L > R (chronic) >> edema less than 5/5 Skin: Warm, dry.  LABS:  BMET  Recent Labs Lab 04/06/17 1200 04/07/17 0356 04/07/17 1422  NA 138 142 143  K 3.3* 3.2* 3.3*  CL 106 107 109  CO2 25 26 26   BUN 10 13 14   CREATININE 0.88 0.88 0.92  GLUCOSE 120* 105* 118*    Electrolytes  Recent  Labs Lab 04/05/17 0430  04/06/17 0346 04/06/17 1200 04/07/17 0356 04/07/17 1422  CALCIUM 7.2*  < > 7.4* 7.5* 7.5* 7.7*  MG 1.5*  --  1.9  --  1.9  --   PHOS 4.3  --  2.1*  --  1.2*  --   < > = values in this interval not displayed.  CBC  Recent Labs Lab 04/05/17 0430  04/06/17 0346  04/06/17 1805 04/07/17 0041 04/07/17 0356  WBC 26.4*  --  21.1*  --   --   --  17.4*  HGB 12.0*  < > 10.9*  < > 10.5* 10.6* 10.7*  HCT 36.4*  < > 34.7*  < > 32.1* 34.1* 33.9*  PLT 186  --  130*  --   --   --  131*  < > = values in this interval not displayed.  Coag's  Recent Labs Lab 04/04/17 1530  INR 1.49    Sepsis Markers  Recent Labs Lab 04/04/17 2358 04/05/17 0923 04/06/17 0346 04/07/17 0356  LATICACIDVEN 2.9* 3.1*  --   --   PROCALCITON  --  1.31 1.66 1.19    ABG  Recent Labs Lab 04/05/17 0440 04/06/17 0347 04/06/17 0946  PHART 7.322* 7.331* 7.346*  PCO2ART 45.8 54.0* 48.1*  PO2ART 80.0* 155.0* 104.0    Liver Enzymes  Recent Labs Lab 04/01/17 0301 04/04/17 1918  AST 26 66*  ALT 18 25  ALKPHOS 52 63  BILITOT 0.7 3.3*  ALBUMIN 2.3* 3.5    Cardiac Enzymes  Recent Labs Lab 04/04/17 1918 04/04/17 2348 04/05/17 0923  TROPONINI 3.85* 15.43* 33.25*    Glucose  Recent Labs Lab 04/06/17 1619 04/06/17 1928 04/06/17 2337 04/07/17 0451 04/07/17 0731 04/07/17 1216  GLUCAP 105* 113* 98 100* 117* 128*    Imaging No results found.   STUDIES:  EGD 4/28 >  type 1 isolated gastric varices without bleeding, single non-bleeding angiodysplastic lesion in the stomach, normal duodenum. Capsule endoscopy 5/2 > small non-bleeding AVM in mid small bowel and one non-specific erosion in the small bowel, no evidence of blood in the small bowel or colon (though poor visualization). Colonoscopy 4/30 > large amount of fresh and old blood along with clots in the entire colon along with diverticulosis in the sigmoid and descending colon without active bleeding, non  bleeding internal hemorrhoids.  Source of bleeding felt to be gastric varices.  CULTURES: Blood 4/28 > neg MRSA (-) Trache 5/4 > (-) so far  ANTIBIOTICS: Vanc 5/4 >  Cefepime 5/4 >  Flagyl 5/4 >    SIGNIFICANT EVENTS: 4/28 > admit 5/3 > decompensation following colonoscopy > transferred to ICU > intubated. Shock.   LINES/TUBES: ETT 5/3 >  R IJ CVL 5/3 >  R radial a line 5/3 >   DISCUSSION: 66 y.o. male with hx EtOH cirrhosis and portal HTN as well as previous GI bleed (3 admissions since Jan 2017).  Admitted 4/28 with GI bleed.  EGD showed gastric varices without bleeding, capsule endo  showed small non bleeding AVM in small bowel, colonoscopy showed large amount of fresh and old blood throughout entire colon along with diverticulosis. After colonoscopy, he had sudden drop in BP; therefore, received aggressive IVF's and PRBC's.  He was then transferred to ICU where he required intubation for respiratory insufficiency. Required massive transfusion and pressors. Febrile, inc WBC and on abx.    ASSESSMENT / PLAN:  PULMONARY A: Acute Hypoxemic resp failure 2/2 acute pulm edema with volume resuscitation with GI bleed + Demand ischemia + TRALI +/- HCAP  COPD without evidence of exacerbation. Tobacco dependence. P:   Full vent support. On 40% FiO2 and PEEP 5 >> better.  Wean as able. Looks good on PST. Given recent "big" demand ischemia on Thursday/Friday, will rest on the weekend and maybe extubate on Monday/Tuesday VAP prevention measures. Cont  diuresis (lasix 40 TID + KCl). Tolerating diuresis despite pressors Cont Abx for  possible HCAP. Fever pattern better with abx. Cultures (-).  Cont pressors. Wean off neosynephrine   CARDIOVASCULAR A:  Hemorrhagic shock - due to bleeding gastric and esophageal varices.  Demand ischemia Pulmonary edema Hx SVT, HTN, dCHF (Echo from Jan 2017 with EF 65-70%, G2DD). P:  Continue pressors > weaning off Cont diuresis at 40 q8. Tolerates  it despite pressors Echo 5/4 EF 45%, hypokinesis Can not give heparin + cardiac drugs (BB, ASA, statin, etc) 2/2 hemorrhagic shock + cirrhosis   RENAL A:   Azotemia Pulm edema P:   Diuresce as he tolerates   GASTROINTESTINAL A:   Hemorrhagic shock  due to bleeding gastric and esophageal varices.  S/P TIPS and BRTO on 5/3 Hx EtOH cirrhosis, portal HTN / gastropathy, hepatic encephalopathy. P:   Hb and Hct have been stable. No further bleeding Cont octreotide drip >> wean off in am Cont Pantoprazole BID NPO for now. Holding off on [place]hoff 2/2 esophageal varices Continue preadmission lactulose PR   HEMATOLOGIC A:   Hemorrhagic shock Thrombocytopenia. P:  Transfuse for Hgb < 7. No transfusion over 2 days.   SCD's only.   INFECTIOUS A:   Concern for HCAP, possible abd infection.  P:   Cont cefepime + Vanc + flagyl panculture f/u > (-) so far PCT elevated but better   ENDOCRINE A:   No acute issues.   P:   No interventions required.   NEUROLOGIC A:   Acute encephalopathy, resolved.  Hx EtOH abuse (reportedly stopped 1 year ago). P:   Sedation: Fentanyl gtt / Midazolam PRN. RASS goal: 0 - -1 Daily WUA. Continue preadmission thiamine / folate.  Family updated: Plan d/w pt and girlfriend on 5/4. No family around 5/6  Interdisciplinary Family Meeting v Palliative Care Meeting:  Due by: 04/11/17.  CC time: 35 min.    Monica Becton, MD 04/07/2017, 3:22 PM [facility] Pager [phone] After 3 pm or if no answer, call [phone]

## 2017-04-07 NOTE — Progress Notes (Signed)
Progress Note  Patient Name: John Warren Date of Encounter: 04/07/2017  Primary Cardiologist: [place] intubated and sedated. Labs stable overnight.   Inpatient Medications    Scheduled Meds: . budesonide (PULMICORT) nebulizer solution  0.5 mg Nebulization BID  . chlorhexidine gluconate (MEDLINE KIT)  15 mL Mouth Rinse BID  . Chlorhexidine Gluconate Cloth  6 each Topical Daily  . folic acid  1 mg Intravenous Daily  . furosemide  40 mg Intravenous Q8H  . ipratropium-albuterol  3 mL Nebulization Q6H  . lactulose  300 mL Rectal BID  . mouth rinse  15 mL Mouth Rinse QID  . pantoprazole (PROTONIX) IV  40 mg Intravenous Q12H  . rifaximin  550 mg Per Tube BID  . rocuronium  50 mg Intravenous Once  . sodium chloride flush  10-40 mL Intracatheter Q12H  . thiamine  100 mg Intravenous Daily   Continuous Infusions: . sodium chloride 10 mL/hr at 04/07/17 1000  . sodium chloride 10 mL/hr at 04/07/17 1000  . ceFEPime (MAXIPIME) IV Stopped (04/07/17 0629)  . fentaNYL infusion INTRAVENOUS Stopped (04/07/17 0826)  . metronidazole Stopped (04/07/17 0941)  . norepinephrine (LEVOPHED) Adult infusion Stopped (04/07/17 0000)  . octreotide  (SANDOSTATIN)    IV infusion 50 mcg/hr (04/07/17 1000)  . phenylephrine 64m/500mL (0.152mmL) infusion 175 mcg/min (04/07/17 1000)  . potassium phosphate IVPB (mmol) 30 mmol (04/07/17 0552)  . vancomycin (VANCOCIN) IVPB 1000 mg/100 mL central line Stopped (04/07/17 0344)   PRN Meds: acetaminophen **OR** acetaminophen, diphenhydrAMINE, fentaNYL, midazolam, midazolam, sodium chloride flush   Vital Signs    Vitals:   04/07/17 0930 04/07/17 0945 04/07/17 1000 04/07/17 1015  BP:   (!) 93/52   Pulse: 97 (!) 101 100 96  Resp: (!) 24 (!) 23 (!) 24 (!) 24  Temp: 98.1 F (36.7 C) 98.4 F (36.9 C) 98.4 F (36.9 C) 98.4 F (36.9 C)  TempSrc:      SpO2: 94% 94% 92% 96%  Weight:      Height:        Intake/Output Summary (Last 24  hours) at 04/07/17 1021 Last data filed at 04/07/17 1000  Gross per 24 hour  Intake          5165.37 ml  Output             6675 ml  Net         -1509.63 ml   Filed Weights   04/05/17 0130 04/06/17 0200 04/07/17 0401  Weight: 227 lb 8.2 oz (103.2 kg) 231 lb 0.7 oz (104.8 kg) 227 lb 11.8 oz (103.3 kg)    Telemetry    Sinus rhythm in 90's - Personally Reviewed  ECG    No EKG today  Physical Exam  General appearance: no distress and intubated, sedated on vent, awakens to voice, denies chest pain when asked Neck: no carotid bruit and no JVD Lungs: diminished breath sounds bilaterally Heart: regular rate and rhythm, systolic murmur: early systolic 2/6, blowing at apex and no gallop Abdomen: distended Extremities: edema anasarcicd Pulses: 2+ and symmetric Skin: pale, cool, dry Neurologic: Mental status: awakens to voice, able to answer questions Psych: pleasant   Labs    Chemistry Recent Labs Lab 04/01/17 0301  04/04/17 1918  04/06/17 0346 04/06/17 1200 04/07/17 0356  NA 135  < > 137  < > 138 138 142  K 3.8  < > 3.8  < > 3.5 3.3* 3.2*  CL 111  < >  102  < > 103 106 107  CO2 20*  < > 25  < > '26 25 26  ' GLUCOSE 130*  < > 192*  < > 130* 120* 105*  BUN 13  < > 9  < > '11 10 13  ' CREATININE 0.78  < > 0.89  < > 0.96 0.88 0.88  CALCIUM 7.4*  < > 7.2*  < > 7.4* 7.5* 7.5*  PROT 4.2*  --  5.6*  --   --   --   --   ALBUMIN 2.3*  --  3.5  --   --   --   --   AST 26  --  66*  --   --   --   --   ALT 18  --  25  --   --   --   --   ALKPHOS 52  --  63  --   --   --   --   BILITOT 0.7  --  3.3*  --   --   --   --   GFRNONAA >60  < > >60  < > >60 >60 >60  GFRAA >60  < > >60  < > >60 >60 >60  ANIONGAP 4*  < > 10  < > '9 7 9  ' < > = values in this interval not displayed.   Hematology Recent Labs Lab 04/05/17 0430  04/06/17 0346  04/06/17 1805 04/07/17 0041 04/07/17 0356  WBC 26.4*  --  21.1*  --   --   --  17.4*  RBC 4.13*  --  3.84*  --   --   --  3.75*  HGB 12.0*  < > 10.9*   < > 10.5* 10.6* 10.7*  HCT 36.4*  < > 34.7*  < > 32.1* 34.1* 33.9*  MCV 88.1  --  90.4  --   --   --  90.4  MCH 29.1  --  28.4  --   --   --  28.5  MCHC 33.0  --  31.4  --   --   --  31.6  RDW 17.3*  --  17.6*  --   --   --  17.6*  PLT 186  --  130*  --   --   --  131*  < > = values in this interval not displayed.  Cardiac Enzymes  Recent Labs Lab 04/04/17 1918 04/04/17 2348 04/05/17 0923  TROPONINI 3.85* 15.43* 33.25*   No results for input(s): TROPIPOC in the last 168 hours.   BNP  Recent Labs Lab 04/04/17 1530  BNP 96.1     DDimer No results for input(s): DDIMER in the last 168 hours.   Radiology    No results found.  Patient Profile     66 y.o. male with cirrhosis and gastric varices and recurrent GI bleeding, had circulatory collapse associated with ECG changes suggestive of anterolateral ischemia, resolved after correction of hypotension and anemia using transfusion and pressors. Troponin increased to 33.  Assessment & Plan    1. CAD:  NSTEMI - troponin to 33. There is a new anterior WMA with EF 45-50%. Not a cath candidate or anticoagulation candidate at this time. Continue adequate treatment of anemia and pressors to keep MAP >60. Denies chest pain today.  2. 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 Hemoglobin appears stable. Plan to wean octreotide gtts tomorrow.  3. Acute systolic congestive heart failure - LVEF 45-50%, volume overloaded from resuscitation and likely new anterior MI - up 9L today. Creatinine stable. Significant IV fluid intake, despite diuresis. On lasix to 40 mg IV TID - continue today - try to reduce IV intake (levophed weaned off, remains on neo). Barely net negative yesterday.  Pixie Casino, MD, [place]  Attending Cardiologist  Direct Dial: [phone]  Fax: [phone]  Website:  [email or web address]  Pixie Casino, MD  04/07/2017, 10:21 AM

## 2017-04-07 NOTE — Progress Notes (Signed)
Referring Physician(s): Outlaw,W  Supervising Physician: Aletta Edouard  Patient Status:  [facility] - In-pt  Chief Complaint:  GI bleed  Subjective: Pt remains intubated, girlfriend in room; no further bleeding overnight for nursing   Allergies: Penicillins  Medications:  Current Facility-Administered Medications:  .  0.9 %  sodium chloride infusion, , Intravenous, Continuous, Lucious Groves, DO, Last Rate: 10 mL/hr at 04/06/17 0800 .  0.9 %  sodium chloride infusion, , Intravenous, Continuous, Raylene Miyamoto, MD, Last Rate: 10 mL/hr at 04/06/17 0800 .  acetaminophen (TYLENOL) tablet 650 mg, 650 mg, Oral, Q6H PRN, 650 mg at 04/03/17 1737 **OR** acetaminophen (TYLENOL) suppository 650 mg, 650 mg, Rectal, Q6H PRN, Rice, Resa Miner, MD .  budesonide (PULMICORT) nebulizer solution 0.5 mg, 0.5 mg, Nebulization, BID, de Dios, Cameron A, MD, 0.5 mg at 04/06/17 0836 .  ceFEPIme (MAXIPIME) 1 g in dextrose 5 % 50 mL IVPB, 1 g, Intravenous, Q8H, de Dios, Mike Gip, MD, Stopped at 04/06/17 [phone] .  chlorhexidine gluconate (MEDLINE KIT) (PERIDEX) 0.12 % solution 15 mL, 15 mL, Mouth Rinse, BID, de Dios, [place] A, MD, 15 mL at 04/06/17 0749 .  Chlorhexidine Gluconate Cloth 2 % PADS 6 each, 6 each, Topical, Daily, Raylene Miyamoto, MD, 6 each at 04/05/17 1644 .  fentaNYL (SUBLIMAZE) bolus via infusion 25 mcg, 25 mcg, Intravenous, Q1H PRN, Jennelle Human B, NP, 25 mcg at 04/05/17 2055 .  fentaNYL 2550mg in NS 2554m(101mml) infusion-PREMIX, 25-400 mcg/hr, Intravenous, Continuous, Simpson, Paula B, NP, Last Rate: 20 mL/hr at 04/06/17 0800, 200 mcg/hr at 04/06/17 0800 .  folic acid injection 1 mg, 1 mg, Intravenous, Daily, Desai, Rahul P, PA-C, 1 mg at 04/06/17 0924496 furosemide (LASIX) injection 40 mg, 40 mg, Intravenous, Q8H, Hilty, Kenneth C, MD .  ipratropium-albuterol (DUONEB) 0.5-2.5 (3) MG/3ML nebulizer solution 3 mL, 3 mL, Nebulization, Q6H, Simpson, Paula B, NP, 3  mL at 04/06/17 0836 .  lactulose (CHRONULAC) enema 200 gm, 300 mL, Rectal, BID, de Dios, JosLake Stickney MD, 300 mL at 04/06/17 0749 .  MEDLINE mouth rinse, 15 mL, Mouth Rinse, QID, de Dios, JosWallula MD, 15 mL at 04/06/17 0352 .  metroNIDAZOLE (FLAGYL) IVPB 500 mg, 500 mg, Intravenous, Q8H, de Dios, JosRoy MD, Last Rate: 100 mL/hr at 04/06/17 0928, 500 mg at 04/06/17 0928 .  midazolam (VERSED) injection 1 mg, 1 mg, Intravenous, Q15 min PRN, SimJennelle Human NP .  midazolam (VERSED) injection 1 mg, 1 mg, Intravenous, Q2H PRN, SimJennelle Human NP .  norepinephrine (LEVOPHED) 16 mg in dextrose 5 % 250 mL (0.064 mg/mL) infusion, 0-40 mcg/min, Intravenous, Titrated, VinEvette DoffingnMallie MusselD, Last Rate: 14.1 mL/hr at 04/06/17 0800, 15 mcg/min at 04/06/17 0800 .  [COMPLETED] octreotide (SANDOSTATIN) 2 mcg/mL load via infusion 50 mcg, 50 mcg, Intravenous, Once, 50 mcg at 04/04/17 1300 **AND** octreotide (SANDOSTATIN) 500 mcg in sodium chloride 0.9 % 250 mL (2 mcg/mL) infusion, 50 mcg/hr, Intravenous, Continuous, Outlaw, Noa, MD, Last Rate: 25 mL/hr at 04/06/17 0800, 50 mcg/hr at 04/06/17 0800 .  pantoprazole (PROTONIX) injection 40 mg, 40 mg, Intravenous, Q12H, HofLucious GrovesO, 40 mg at 04/06/17 0926 .  phenylephrine (NEO-SYNEPHRINE) 80 mg in sodium chloride 0.9 % 500 mL (0.16 mg/mL) infusion, 0-400 mcg/min, Intravenous, Titrated, VinAxel FillerD, Last Rate: 112.5 mL/hr at 04/06/17 0800, 300 mcg/min at 04/06/17 0800 .  potassium chloride 10 mEq in 50 mL *CENTRAL LINE* IVPB,  10 mEq, Intravenous, Q1 Hr x 2, de Dios, [place] A, MD, Last Rate: 50 mL/hr at 04/06/17 0926, 10 mEq at 04/06/17 0926 .  rifaximin (XIFAXAN) tablet 550 mg, 550 mg, Per Tube, BID, Desai, Rahul P, PA-C .  rocuronium (ZEMURON) injection 50 mg, 50 mg, Intravenous, Once, Desai, Rahul P, PA-C .  sodium chloride flush (NS) 0.9 % injection 10-40 mL, 10-40 mL, Intracatheter, Q12H, Raylene Miyamoto, MD, 10 mL  at 04/05/17 2051 .  sodium chloride flush (NS) 0.9 % injection 10-40 mL, 10-40 mL, Intracatheter, PRN, Raylene Miyamoto, MD .  thiamine (B-1) injection 100 mg, 100 mg, Intravenous, Daily, Desai, Rahul P, PA-C, 100 mg at 04/06/17 0926 .  [COMPLETED] vancomycin (VANCOCIN) 1,000 mg in sodium chloride 0.9 % 100 mL IVPB, 1,000 mg, Intravenous, Once, Stopped at 04/05/17 1206 **FOLLOWED BY** vancomycin (VANCOCIN) 1,000 mg in sodium chloride 0.9 % 100 mL IVPB, 1,000 mg, Intravenous, Q12H, Priscella Mann, RPH, Stopped at 04/06/17 0300    Vital Signs: BP (!) 93/52   Pulse 96   Temp 98.4 F (36.9 C)   Resp (!) 24   Ht '5\' 11"'  (1.803 m)   Wt 227 lb 11.8 oz (103.3 kg)   SpO2 96%   BMI 31.76 kg/m   Physical Exam intubated but awakes and communicates Neck:right IJ line intact, site okay,  Chest with scattered crackles,  Heart with tachycardic but regular rhythm,  Abdomen sl distended, positive bowel sounds,NT  Imaging:  IR BATO and TIPS  Result Date: 04/05/2017 CLINICAL DATA:  Cirrhosis with gastric varices. Acute hemorrhage and hemodynamic instability. See previous consultation by Dr. Anselm Pancoast. EXAM: 1. TRANSJUGULAR INTRAHEPATIC PORTOSYSTEMIC SHUNT 2. IR PARACENTESIS 1. ULTRASOUND GUIDANCE FOR VENOUS ACCESS x2 2. TRANSJUGLAR INTRHEPATIC PORTOSYSTEMIC SHUNT (TIPS) CREATION 3. SELECTIVE GASTRIC VARICEAL VENOGRAM 4. PERCUTANEOUS SCLEROSANT EMBOLIZATION ANESTHESIA/SEDATION: General - as administered by the Anesthesia department MEDICATIONS: Sodium Tetradecyl Sulfate 17m, lipiodol 268miv CONTRAST:  [id number]-300 IOPAMIDOL (ISOVUE-300) INJECTION 61% FLUOROSCOPY TIME:  Fluoroscopy Time: 61 min 36 sec [phone] COMPLICATIONS: None immediate. PROCEDURE: Informed written consent was obtained from the family after a thorough discussion of the procedural risks, benefits and alternatives. All questions were addressed. Maximal Sterile Barrier Technique was utilized including caps, mask, sterile gowns, sterile  gloves, sterile drape, hand hygiene and skin antiseptic. A timeout was performed prior to the initiation of the procedure. The skin overlying the right upper abdominal quadrant as well as the right neck and femoral region were prepped and draped in usual sterile fashion. Initial ultrasound scanning demonstrates a small amount of perihepatic ascites ; no paracentesis was indicated. Under direct ultrasound guidance, a peripheral aspect of the right portal vein was accessed with a 22 gauge needle. Ultrasound image was saved for procedural documentation purposes. The track was dilated with the inner 3 French catheter from the [employer] [place]. Several contrast limited portal venograms were performed. A Nitrex wire was advanced through the 3 FrePakistantheter with the radiopaque transition positioned at the target entrance to the peripheral right portal vein. Next, the right internal jugular vein was accessed under direct ultrasound. Ultrasound image was saved for procedural documentation purposes. This allowed for placement of the 10 French TIPS vascular sheath. With the aid of angiographic guidewires, an MPA catheter was utilized to select the right hepatic vein and a hepatic venogram was performed. Under fluoroscopic guidance, the right portal vein with targeted with a Rsch-Uchida TIPS needle directed at the radiopaque target within the right portal vein. Ultimately, the  right portal vein was accessed at a desirable location allowing advancement of a stiff Glidewire into the main portal vein. A 4 French glide catheter was advanced over the stiff Glidewire and a portal venogram was performed. Next, as there was difficulty advancing a measuring catheter through the intrahepatic track, the track was dilated with a 4 mm Mustang balloon, allowing advancement measuring pigtail into the right portal vein. A portal venogram was performed with a measuring Omni Flush catheter. Tract further dilated with 8 mm Conquest balloon to  facilitate advancement of the sheath into the main portal vein. The percutaneous portal microcatheter and wire were removed. Next, a 2 cm (un covered) x 6 cm (covered) x 10 mm GORE VIATORR TIPS Endoprosthesis was advanced through the intra portal track and deployed. The TIPS stent was angioplastied in multiple stations to 10 mm diameter. Follow-up portal venogram demonstrated good flow, no residual stenosis. There is continued retrograde collateral flow through dilated left gastric vein to supply the gastric varices, ultimately draining for retroperitoneal collaterals into the left renal vein. A 5 French angiographic catheter was advanced into the dilated left gastric vein with the aid of an Amplatz Glidewire. Under direct ultrasound guidance, the right common femoral vein was accessed with a micropuncture kit. An ultrasound image was saved for procedural documentation purposes. Over a Bentson wire, the track was dilated allowing for placement of a 10 French TIPS sheath to the level of the mid IVC under intermittent fluoroscopic guidance. A Cobra catheter was utilized to select the left renal vein and a left renal venogram was performed. A Rosen wire was advanced into the caudal pole of the left kidney as the access sheath was advanced to the origin of left renal vein. Next, a 4 French angled glide catheter was cannulated within the sheath, next to the [place] wire, and with the use of a regular glidewire, was utilized to select the outflow origin of the hypertrophied gastric varix. The Rosen wire was then removed and over a coiled Amplatz wire, the vascular sheath was advanced into the outflow of the splenorenal shunt. Contrast injection confirmed appropriate positioning. The left gastric vein catheter was exchanged for a 100 cm Renato Battles scientific occlusion balloon. A 65 cm [place] scientific occlusion balloon was then advanced into the caudal aspect of the outflow of the splenorenal shunt. Inflation of the left  gastric balloon and subsequent injection confirmed stasis of flow in the gastric varix complex. Inflation of the splenorenal shunt balloon pulled caudal to near vessel's confluence with the left renal veinwith subsequent left gastric injection confirmed stasis of flow in the gastric varix complex. Next, a Lantern micro catheter was advanced through the splenorenal occlusion balloon into the caudal aspect of the markedly hypertrophied shunt. Venography confirmed appropriate positioning. A second Lantern microcatheter was advanced through the left gastric venous occlusion balloon into the left gastric vein. The left gastric occlusion balloon was inflated. At this time, sclerosant was administered (in a mixture of 2 cc of Lipiodol, 4 cc of STS and 6 cc of air) under fluoroscopic guidance. Multiple spot fluoroscopic and radiographic images were obtained during the sclerosant administration. Once sclerosis and was noted into the proximal aspect of the splenorenal shunt, the inferior balloon was inflated. At this time multiple overlapping Ruby coils were deployed within the left renal vein and subsequently within the caudal aspect of the splenorenral shunt. A completion left renal venogram was performed and the procedure was terminated. Follow-up splenic venogram confirms no further flow through the gastric  variceal complex. Good antegrade flow through the TIPS shunt. Continued hepatopetal antegrade perfusion through the left portal vein. All wires, catheters and sheaths were removed from the patient. Hemostasis was achieved at the right IJ and groin access sites with manual compression. A dressing was placed. The patient tolerated the procedure well. Patient transferred to the PACU. Operators:  Henn/ Hassell/ Watts IMPRESSION: 1. Successful creation of a TIPS with reduction of portosystemic gradient to 3 mmHg. 2. Successful ultrasound-guided paracentesis yielding 13 L of ascitic fluid. PLAN: - Continued in-patient  management as per the providing clinical service. - Followup post-BATO abd CT  when clinically stable - Patient be seen in follow-up consultation at the interventional radiology clinic with postprocedural TIPS ultrasound and labs (CMP, INR and ammonia levels) in 4-6 weeks. Electronically Signed   By: Lucrezia Europe M.D.   On: 04/05/2017 11:43    Labs:  CBC:  Recent Labs  04/04/17 2348 04/05/17 0430  04/06/17 0346 04/06/17 1200 04/06/17 1805 04/07/17 0041 04/07/17 0356  WBC 24.2* 26.4*  --  21.1*  --   --   --  17.4*  HGB 11.7* 12.0*  < > 10.9* 10.4* 10.5* 10.6* 10.7*  HCT 35.8* 36.4*  < > 34.7* 33.2* 32.1* 34.1* 33.9*  PLT 171 186  --  130*  --   --   --  131*  < > = values in this interval not displayed.  COAGS:  Recent Labs  03/29/17 1855 03/31/17 0352 04/04/17 1530  INR 1.34 1.32 1.49    BMP:  Recent Labs  04/05/17 1608 04/06/17 0346 04/06/17 1200 04/07/17 0356  NA 136 138 138 142  K 4.3 3.5 3.3* 3.2*  CL 103 103 106 107  CO2 '25 26 25 26  ' GLUCOSE 156* 130* 120* 105*  BUN '11 11 10 13  ' CALCIUM 7.3* 7.4* 7.5* 7.5*  CREATININE 1.08 0.96 0.88 0.88  GFRNONAA >60 >60 >60 >60  GFRAA >60 >60 >60 >60    LIVER FUNCTION TESTS:  Recent Labs  03/29/17 1634 03/30/17 0228 04/01/17 0301 04/04/17 1918  BILITOT 1.3* 1.1 0.7 3.3*  AST 32 33 26 66*  ALT '27 23 18 25  ' ALKPHOS 86 70 52 63  PROT 6.0* 5.0* 4.2* 5.6*  ALBUMIN 3.4* 3.0* 2.3* 3.5    Assessment and Plan: Pt with history of cirrhosis, portal hypertension, recent GI bleed; status post TIPS and BATO of gastric varices on 5/3.  No further bleeding at this point. WBC cont to trend down IR cont to follow  Electronically Signed: Ascencion Dike 04/07/2017, 10:33 AM   I spent a total of 20 minutes at the the patient's bedside AND on the patient's hospital floor or unit, greater than 50% of which was counseling/coordinating care for TIPS/BATO

## 2017-04-07 NOTE — Progress Notes (Signed)
[facility] Progress Note  Subjective: Patient intubated but alert, some attempts at weaning ventilator parameters being made this morning. Patient is weaned off leave the fed and continues on octreotide. Lactulose enema currently returning darkish fluid.  Objective: Vital signs in last 24 hours: Temp:  [98.4 F (36.9 C)-99.7 F (37.6 C)] 98.6 F (37 C) (05/06 0815) Pulse Rate:  [82-107] 88 (05/06 0815) Resp:  [12-38] 16 (05/06 0815) BP: (93-120)/(52-69) 95/61 (05/06 0800) SpO2:  [94 %-100 %] 98 % (05/06 0815) Arterial Line BP: (83-150)/(36-56) 114/48 (05/06 0815) FiO2 (%):  [40 %-60 %] 40 % (05/06 0815) Weight:  [103.3 kg (227 lb 11.8 oz)] 103.3 kg (227 lb 11.8 oz) (05/06 0401) Weight change: -1.5 kg (-3 lb 4.9 oz)   PE: Unchanged  Lab Results: Results for orders placed or performed during the hospital encounter of 03/29/17 (from the past 24 hour(s))  I-STAT 3, arterial blood gas (G3+)     Status: Abnormal   Collection Time: 04/06/17  9:46 AM  Result Value Ref Range   pH, Arterial 7.346 (L) 7.350 - 7.450   pCO2 arterial 48.1 (H) 32.0 - 48.0 mmHg   pO2, Arterial 104.0 83.0 - 108.0 mmHg   Bicarbonate 26.2 20.0 - 28.0 mmol/L   TCO2 28 0 - 100 mmol/L   O2 Saturation 98.0 %   Patient temperature 99.0 F    Collection site ARTERIAL LINE    Drawn by Operator    Sample type ARTERIAL   Basic metabolic panel     Status: Abnormal   Collection Time: 04/06/17 12:00 PM  Result Value Ref Range   Sodium 138 135 - 145 mmol/L   Potassium 3.3 (L) 3.5 - 5.1 mmol/L   Chloride 106 101 - 111 mmol/L   CO2 25 22 - 32 mmol/L   Glucose, Bld 120 (H) 65 - 99 mg/dL   BUN 10 6 - 20 mg/dL   Creatinine, Ser 0.88 0.61 - 1.24 mg/dL   Calcium 7.5 (L) 8.9 - 10.3 mg/dL   GFR calc non Af Amer >60 >60 mL/min   GFR calc Af Amer >60 >60 mL/min   Anion gap 7 5 - 15  Hemoglobin and hematocrit, blood     Status: Abnormal   Collection Time: 04/06/17 12:00 PM  Result Value Ref Range   Hemoglobin  10.4 (L) 13.0 - 17.0 g/dL   HCT 33.2 (L) 39.0 - 52.0 %  Glucose, capillary     Status: Abnormal   Collection Time: 04/06/17 12:02 PM  Result Value Ref Range   Glucose-Capillary 112 (H) 65 - 99 mg/dL   Comment 1 Capillary Specimen    Comment 2 Notify RN   Glucose, capillary     Status: Abnormal   Collection Time: 04/06/17  4:19 PM  Result Value Ref Range   Glucose-Capillary 105 (H) 65 - 99 mg/dL   Comment 1 Capillary Specimen    Comment 2 Notify RN   Hemoglobin and hematocrit, blood     Status: Abnormal   Collection Time: 04/06/17  6:05 PM  Result Value Ref Range   Hemoglobin 10.5 (L) 13.0 - 17.0 g/dL   HCT 32.1 (L) 39.0 - 52.0 %  Glucose, capillary     Status: Abnormal   Collection Time: 04/06/17  7:28 PM  Result Value Ref Range   Glucose-Capillary 113 (H) 65 - 99 mg/dL   Comment 1 Notify RN   Glucose, capillary     Status: None   Collection Time: 04/06/17 11:37 PM  Result Value Ref Range   Glucose-Capillary 98 65 - 99 mg/dL   Comment 1 Notify RN   Hemoglobin and hematocrit, blood     Status: Abnormal   Collection Time: 04/07/17 12:41 AM  Result Value Ref Range   Hemoglobin 10.6 (L) 13.0 - 17.0 g/dL   HCT 34.1 (L) 39.0 - 52.0 %  CBC     Status: Abnormal   Collection Time: 04/07/17  3:56 AM  Result Value Ref Range   WBC 17.4 (H) 4.0 - 10.5 K/uL   RBC 3.75 (L) 4.22 - 5.81 MIL/uL   Hemoglobin 10.7 (L) 13.0 - 17.0 g/dL   HCT 33.9 (L) 39.0 - 52.0 %   MCV 90.4 78.0 - 100.0 fL   MCH 28.5 26.0 - 34.0 pg   MCHC 31.6 30.0 - 36.0 g/dL   RDW 17.6 (H) 11.5 - 15.5 %   Platelets 131 (L) 150 - 400 K/uL  Basic metabolic panel     Status: Abnormal   Collection Time: 04/07/17  3:56 AM  Result Value Ref Range   Sodium 142 135 - 145 mmol/L   Potassium 3.2 (L) 3.5 - 5.1 mmol/L   Chloride 107 101 - 111 mmol/L   CO2 26 22 - 32 mmol/L   Glucose, Bld 105 (H) 65 - 99 mg/dL   BUN 13 6 - 20 mg/dL   Creatinine, Ser 0.88 0.61 - 1.24 mg/dL   Calcium 7.5 (L) 8.9 - 10.3 mg/dL   GFR calc non Af  Amer >60 >60 mL/min   GFR calc Af Amer >60 >60 mL/min   Anion gap 9 5 - 15  Magnesium     Status: None   Collection Time: 04/07/17  3:56 AM  Result Value Ref Range   Magnesium 1.9 1.7 - 2.4 mg/dL  Phosphorus     Status: Abnormal   Collection Time: 04/07/17  3:56 AM  Result Value Ref Range   Phosphorus 1.2 (L) 2.5 - 4.6 mg/dL  Procalcitonin     Status: None   Collection Time: 04/07/17  3:56 AM  Result Value Ref Range   Procalcitonin 1.19 ng/mL  Glucose, capillary     Status: Abnormal   Collection Time: 04/07/17  4:51 AM  Result Value Ref Range   Glucose-Capillary 100 (H) 65 - 99 mg/dL   Comment 1 Notify RN   Glucose, capillary     Status: Abnormal   Collection Time: 04/07/17  7:31 AM  Result Value Ref Range   Glucose-Capillary 117 (H) 65 - 99 mg/dL   Comment 1 Capillary Specimen    Comment 2 Notify RN     Studies/Results: No results found.    Assessment: Actively bleeding gastric varix. Status post TIPS and BATO  Plan: Wean from the ventilator as tolerated Probably stop octreotide tomorrow Monitor stools and hemoglobin Begin feeding once extubated.    Keveon Amsler C 04/07/2017, 8:48 AM  Pager [phone] If no answer or after 5 PM call [phone]

## 2017-04-08 ENCOUNTER — Encounter (HOSPITAL_COMMUNITY): Payer: Self-pay | Admitting: Gastroenterology

## 2017-04-08 DIAGNOSIS — A419 Sepsis, unspecified organism: Secondary | ICD-10-CM

## 2017-04-08 DIAGNOSIS — R6521 Severe sepsis with septic shock: Secondary | ICD-10-CM

## 2017-04-08 LAB — CBC
HEMATOCRIT: 35.2 % — AB (ref 39.0–52.0)
Hemoglobin: 10.8 g/dL — ABNORMAL LOW (ref 13.0–17.0)
MCH: 27.8 pg (ref 26.0–34.0)
MCHC: 30.7 g/dL (ref 30.0–36.0)
MCV: 90.7 fL (ref 78.0–100.0)
PLATELETS: 128 10*3/uL — AB (ref 150–400)
RBC: 3.88 MIL/uL — ABNORMAL LOW (ref 4.22–5.81)
RDW: 17.7 % — AB (ref 11.5–15.5)
WBC: 14.6 10*3/uL — AB (ref 4.0–10.5)

## 2017-04-08 LAB — BASIC METABOLIC PANEL
Anion gap: 8 (ref 5–15)
BUN: 16 mg/dL (ref 6–20)
CO2: 27 mmol/L (ref 22–32)
CREATININE: 0.92 mg/dL (ref 0.61–1.24)
Calcium: 7.5 mg/dL — ABNORMAL LOW (ref 8.9–10.3)
Chloride: 109 mmol/L (ref 101–111)
GFR calc Af Amer: >60 mL/min (ref >60)
GLUCOSE: 106 mg/dL — AB (ref 65–99)
Potassium: 3.4 mmol/L — ABNORMAL LOW (ref 3.5–5.1)
Sodium: 144 mmol/L (ref 135–145)

## 2017-04-08 LAB — TYPE AND SCREEN
ABO/RH(D): O POS
Antibody Screen: NEGATIVE
UNIT DIVISION: 0
UNIT DIVISION: 0
UNIT DIVISION: 0
UNIT DIVISION: 0
UNIT DIVISION: 0
Unit division: 0
Unit division: 0
Unit division: 0
Unit division: 0

## 2017-04-08 LAB — BPAM RBC
BLOOD PRODUCT EXPIRATION DATE: 201805172359
BLOOD PRODUCT EXPIRATION DATE: 201805242359
BLOOD PRODUCT EXPIRATION DATE: 201805242359
Blood Product Expiration Date: 201805172359
Blood Product Expiration Date: 201805242359
Blood Product Expiration Date: 201805242359
Blood Product Expiration Date: 201805242359
Blood Product Expiration Date: 201805292359
Blood Product Expiration Date: 201805292359
ISSUE DATE / TIME: 201805031012
ISSUE DATE / TIME: 201805031240
ISSUE DATE / TIME: 201805031324
ISSUE DATE / TIME: 201805031012
ISSUE DATE / TIME: 201805031037
ISSUE DATE / TIME: 201805031240
ISSUE DATE / TIME: 201805031324
ISSUE DATE / TIME: 201805061128
ISSUE DATE / TIME: 201805061128
UNIT TYPE AND RH: 5100
UNIT TYPE AND RH: 5100
UNIT TYPE AND RH: 5100
UNIT TYPE AND RH: 5100
UNIT TYPE AND RH: 9500
UNIT TYPE AND RH: 9500
Unit Type and Rh: 5100
Unit Type and Rh: 5100
Unit Type and Rh: 5100

## 2017-04-08 LAB — GLUCOSE, CAPILLARY
GLUCOSE-CAPILLARY: 100 mg/dL — AB (ref 65–99)
GLUCOSE-CAPILLARY: 103 mg/dL — AB (ref 65–99)
GLUCOSE-CAPILLARY: 120 mg/dL — AB (ref 65–99)
Glucose-Capillary: 95 mg/dL (ref 65–99)
Glucose-Capillary: 106 mg/dL — ABNORMAL HIGH (ref 65–99)
Glucose-Capillary: 96 mg/dL (ref 65–99)
Glucose-Capillary: 149 mg/dL — ABNORMAL HIGH (ref 65–99)

## 2017-04-08 LAB — PHOSPHORUS: Phosphorus: 1.5 mg/dL — ABNORMAL LOW (ref 2.5–4.6)

## 2017-04-08 LAB — CULTURE, RESPIRATORY W GRAM STAIN: Special Requests: NORMAL

## 2017-04-08 LAB — PROCALCITONIN: Procalcitonin: 0.9 ng/mL

## 2017-04-08 LAB — MAGNESIUM: Magnesium: 2 mg/dL (ref 1.7–2.4)

## 2017-04-08 MED ORDER — POTASSIUM PHOSPHATES 15 MMOLE/5ML IV SOLN
24.0000 mmol | Freq: Once | INTRAVENOUS | Status: AC
  Administered 2017-04-08: 24 mmol via INTRAVENOUS
  Filled 2017-04-08: qty 8

## 2017-04-08 MED ORDER — ORAL CARE MOUTH RINSE
15.0000 mL | Freq: Two times a day (BID) | OROMUCOSAL | Status: DC
Start: 2017-04-08 — End: 2017-04-13
  Administered 2017-04-08 – 2017-04-12 (×8): 15 mL via OROMUCOSAL

## 2017-04-08 NOTE — Progress Notes (Signed)
[facility] Progress Note and Electrolyte Replacement  Patient Name: John Warren DOB: 1951/02/18 MRN: [id number]  Date of Service  04/08/2017   HPI/Events of Note    Recent Labs Lab 04/04/17 0300  04/04/17 2348 04/05/17 0430  04/06/17 0346 04/06/17 1200 04/07/17 0356 04/07/17 1422 04/08/17 0358  NA 135  < >  --  137  < > 138 138 142 143 144  K 4.8  < >  --  4.3  < > 3.5 3.3* 3.2* 3.3* 3.4*  CL 103  < >  --  103  < > 103 106 107 109 109  CO2 22  < >  --  23  < > 26 25 26 26 27   GLUCOSE 136*  < >  --  135*  < > 130* 120* 105* 118* 106*  BUN 9  < >  --  11  < > 11 10 13 14 16   CREATININE 0.94  < >  --  1.16  < > 0.96 0.88 0.88 0.92 0.92  CALCIUM 7.9*  < >  --  7.2*  < > 7.4* 7.5* 7.5* 7.7* 7.5*  MG 1.8  --   --  1.5*  --  1.9  --  1.9  --  2.0  PHOS  --   --  5.5* 4.3  --  2.1*  --  1.2*  --  1.5*  < > = values in this interval not displayed.  Estimated Creatinine Clearance: 94.1 mL/min (by C-G formula based on SCr of 0.92 mg/dL).  Intake/Output      05/06 0701 - 05/07 0700   I.V. (mL/kg) 2395.2 (23.1)   IV Piggyback 900   Total Intake(mL/kg) 3295.2 (31.8)   Urine (mL/kg/hr) 4920 (2)   Stool 750 (0.3)   Total Output 5670   Net -2374.9        - I/O DETAILED x 24h    Total I/O In: 1428.1 [I.V.:828.1; IV Piggyback:600] Out: 2545 [Urine:1795; Stool:750] - I/O THIS SHIFT    ASSESSMENT LOW K LOW PHOS  eICURN Interventions  REPLETE K AND PHOS   ASSESSMENT: MAJOR ELECTROLYTE      Dr. Brand Males, M.D., [facility]C.P Pulmonary and Critical Care Medicine Staff Physician [facility] Pager: [phone], If no answer or between  15:00h - 7:00h: call [phone]  04/08/2017 5:42 AM

## 2017-04-08 NOTE — Progress Notes (Signed)
Referring Physician(s): Dr Margurite Auerbach  Supervising Physician: Sandi Mariscal  Patient Status:  [facility] - In-pt  Chief Complaint:  GI Bleed  Subjective:  5/3: 1. Transjuglar IntrhepaticPortosystemic Shunt (TIPS) creation 2. 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  Allergies: Penicillins  Medications: Prior to Admission medications   Medication Sig Start Date End Date Taking? Authorizing Provider  acetaminophen (TYLENOL) 500 MG tablet Take 500-1,000 mg by mouth every 8 (eight) hours as needed (for pain).   Yes [provider]  ferrous sulfate 325 (65 FE) MG tablet Take 1 tablet (325 mg total) by mouth 3 (three) times daily with meals. Patient taking differently: Take 325 mg by mouth every evening.  07/17/15  Yes Dunn, Dayna N, PA-C  folic acid (FOLVITE) 1 MG tablet Take 1 tablet (1 mg total) by mouth daily. 01/02/16  Yes Thurnell Lose, MD  furosemide (LASIX) 80 MG tablet Take 80 mg by mouth daily.   Yes [provider]  nitroGLYCERIN (NITROSTAT) 0.4 MG SL tablet Place 0.4 mg under the tongue every 5 (five) minutes as needed for chest pain.   Yes [provider]  potassium chloride SA (K-DUR,KLOR-CON) 20 MEQ tablet Take 20 mEq by mouth every morning.    Yes [provider]  spironolactone (ALDACTONE) 50 MG tablet Take 1 tablet (50 mg total) by mouth daily. Patient taking differently: Take 50 mg by mouth 2 (two) times daily.  01/02/16  Yes Thurnell Lose, MD  thiamine 100 MG tablet Take 1 tablet (100 mg total) by mouth daily. 01/02/16  Yes Thurnell Lose, MD  traMADol (ULTRAM) 50 MG tablet Take 50 mg by mouth every 6 (six) hours as needed (for pain).   Yes [provider]  lactulose (CHRONULAC) 10 GM/15ML solution Take 30 mLs (20 g total) by mouth daily. Patient not taking: Reported on 03/29/2017  01/26/16   Florencia Reasons, MD  omeprazole (PRILOSEC) 40 MG capsule Take 1 capsule (40 mg total) by mouth every evening. Patient not taking: Reported on 03/29/2017 01/02/16   Thurnell Lose, MD  ranitidine (ZANTAC) 150 MG tablet Take 150 mg by mouth 2 (two) times daily.    [provider]  rifaximin (XIFAXAN) 550 MG TABS tablet Take 1 tablet (550 mg total) by mouth 2 (two) times daily. Patient not taking: Reported on 03/29/2017 01/26/16   Florencia Reasons, MD     Vital Signs: BP (!) 98/54   Pulse 90   Temp 98.2 F (36.8 C) (Core (Comment))   Resp 19   Ht '5\' 11"'  (1.803 m)   Wt 228 lb 2.8 oz (103.5 kg)   SpO2 (!) 89%   BMI 31.82 kg/m   Physical Exam  Constitutional: He is oriented to person, place, and time.  Cardiovascular: Normal rate.   Pulmonary/Chest: Effort normal.  Abdominal: Soft. Bowel sounds are normal.  Musculoskeletal: Normal range of motion.  Neurological: He is alert and oriented to person, place, and time.  Skin: Skin is warm and dry.  Right groin site clean;dry NT no bleeding No hematoma Noted 3 small blisters of skin under op site dressing. Blisters are intact and NT  Rt foot 2+ pulses  Psychiatric: He has a normal mood and affect. His behavior is normal.  Nursing note and vitals reviewed.   Imaging: Ir Angiogram Selective Each Additional Vessel  Result Date: 04/05/2017 CLINICAL DATA:  Cirrhosis  with gastric varices. Acute hemorrhage and hemodynamic instability. See previous consultation by Dr. Anselm Pancoast. EXAM: 1. TRANSJUGULAR INTRAHEPATIC PORTOSYSTEMIC SHUNT 2. IR PARACENTESIS 1. ULTRASOUND GUIDANCE FOR VENOUS ACCESS x2 2. TRANSJUGLAR INTRHEPATIC PORTOSYSTEMIC SHUNT (TIPS) CREATION 3. SELECTIVE GASTRIC VARICEAL VENOGRAM 4. PERCUTANEOUS SCLEROSANT EMBOLIZATION ANESTHESIA/SEDATION: General - as administered by the Anesthesia department MEDICATIONS: Sodium Tetradecyl Sulfate 12m, lipiodol 233miv CONTRAST:  20023mSOVUE-300 IOPAMIDOL (ISOVUE-300) INJECTION 61% FLUOROSCOPY  TIME:  Fluoroscopy Time: 61 min 36 sec [id number] COMPLICATIONS: None immediate. PROCEDURE: Informed written consent was obtained from the family after a thorough discussion of the procedural risks, benefits and alternatives. All questions were addressed. Maximal Sterile Barrier Technique was utilized including caps, mask, sterile gowns, sterile gloves, sterile drape, hand hygiene and skin antiseptic. A timeout was performed prior to the initiation of the procedure. The skin overlying the right upper abdominal quadrant as well as the right neck and femoral region were prepped and draped in usual sterile fashion. Initial ultrasound scanning demonstrates a small amount of perihepatic ascites ; no paracentesis was indicated. Under direct ultrasound guidance, a peripheral aspect of the right portal vein was accessed with a 22 gauge needle. Ultrasound image was saved for procedural documentation purposes. The track was dilated with the inner 3 French catheter from the [place]. Several contrast limited portal venograms were performed. A Nitrex wire was advanced through the 3 FrePakistantheter with the radiopaque transition positioned at the target entrance to the peripheral right portal vein. Next, the right internal jugular vein was accessed under direct ultrasound. Ultrasound image was saved for procedural documentation purposes. This allowed for placement of the 10 French TIPS vascular sheath. With the aid of angiographic guidewires, an MPA catheter was utilized to select the right hepatic vein and a hepatic venogram was performed. Under fluoroscopic guidance, the right portal vein with targeted with a Rsch-Uchida TIPS needle directed at the radiopaque target within the right portal vein. Ultimately, the right portal vein was accessed at a desirable location allowing advancement of a stiff Glidewire into the main portal vein. A 4 French glide catheter was advanced over the stiff Glidewire and a portal venogram was  performed. Next, as there was difficulty advancing a measuring catheter through the intrahepatic track, the track was dilated with a 4 mm Mustang balloon, allowing advancement measuring pigtail into the right portal vein. A portal venogram was performed with a measuring Omni Flush catheter. Tract further dilated with 8 mm Conquest balloon to facilitate advancement of the sheath into the main portal vein. The percutaneous portal microcatheter and wire were removed. Next, a 2 cm (un covered) x 6 cm (covered) x 10 mm GORE VIATORR TIPS Endoprosthesis was advanced through the intra portal track and deployed. The TIPS stent was angioplastied in multiple stations to 10 mm diameter. Follow-up portal venogram demonstrated good flow, no residual stenosis. There is continued retrograde collateral flow through dilated left gastric vein to supply the gastric varices, ultimately draining for retroperitoneal collaterals into the left renal vein. A 5 French angiographic catheter was advanced into the dilated left gastric vein with the aid of an Amplatz Glidewire. Under direct ultrasound guidance, the right common femoral vein was accessed with a micropuncture kit. An ultrasound image was saved for procedural documentation purposes. Over a Bentson wire, the track was dilated allowing for placement of a 10 French TIPS sheath to the level of the mid IVC under intermittent fluoroscopic guidance. A Cobra catheter was utilized to select the left renal vein and a left  renal venogram was performed. A Rosen wire was advanced into the caudal pole of the left kidney as the access sheath was advanced to the origin of left renal vein. Next, a 4 French angled glide catheter was cannulated within the sheath, next to the [place] wire, and with the use of a regular glidewire, was utilized to select the outflow origin of the hypertrophied gastric varix. The Rosen wire was then removed and over a coiled Amplatz wire, the vascular sheath was advanced  into the outflow of the splenorenal shunt. Contrast injection confirmed appropriate positioning. The left gastric vein catheter was exchanged for a 100 cm Renato Battles scientific occlusion balloon. A 65 cm [place] scientific occlusion balloon was then advanced into the caudal aspect of the outflow of the splenorenal shunt. Inflation of the left gastric balloon and subsequent injection confirmed stasis of flow in the gastric varix complex. Inflation of the splenorenal shunt balloon pulled caudal to near vessel's confluence with the left renal veinwith subsequent left gastric injection confirmed stasis of flow in the gastric varix complex. Next, a Lantern micro catheter was advanced through the splenorenal occlusion balloon into the caudal aspect of the markedly hypertrophied shunt. Venography confirmed appropriate positioning. A second Lantern microcatheter was advanced through the left gastric venous occlusion balloon into the left gastric vein. The left gastric occlusion balloon was inflated. At this time, sclerosant was administered (in a mixture of 2 cc of Lipiodol, 4 cc of STS and 6 cc of air) under fluoroscopic guidance. Multiple spot fluoroscopic and radiographic images were obtained during the sclerosant administration. Once sclerosis and was noted into the proximal aspect of the splenorenal shunt, the inferior balloon was inflated. At this time multiple overlapping Ruby coils were deployed within the left renal vein and subsequently within the caudal aspect of the splenorenral shunt. A completion left renal venogram was performed and the procedure was terminated. Follow-up splenic venogram confirms no further flow through the gastric variceal complex. Good antegrade flow through the TIPS shunt. Continued hepatopetal antegrade perfusion through the left portal vein. All wires, catheters and sheaths were removed from the patient. Hemostasis was achieved at the right IJ and groin access sites with manual compression.  A dressing was placed. The patient tolerated the procedure well. Patient transferred to the PACU. Operators:  Henn/ Hassell/ Watts IMPRESSION: 1. Successful creation of a TIPS with reduction of portosystemic gradient to 3 mmHg. 2. Successful ultrasound-guided paracentesis yielding 13 L of ascitic fluid. PLAN: - Continued in-patient management as per the providing clinical service. - Followup post-BATO abd CT  when clinically stable - Patient be seen in follow-up consultation at the interventional radiology clinic with postprocedural TIPS ultrasound and labs (CMP, INR and ammonia levels) in 4-6 weeks. Electronically Signed   By: Lucrezia Europe M.D.   On: 04/05/2017 11:43   Ir Angiogram Selective Each Additional Vessel  Result Date: 04/05/2017 CLINICAL DATA:  Cirrhosis with gastric varices. Acute hemorrhage and hemodynamic instability. See previous consultation by Dr. Anselm Pancoast. EXAM: 1. TRANSJUGULAR INTRAHEPATIC PORTOSYSTEMIC SHUNT 2. IR PARACENTESIS 1. ULTRASOUND GUIDANCE FOR VENOUS ACCESS x2 2. TRANSJUGLAR INTRHEPATIC PORTOSYSTEMIC SHUNT (TIPS) CREATION 3. SELECTIVE GASTRIC VARICEAL VENOGRAM 4. PERCUTANEOUS SCLEROSANT EMBOLIZATION ANESTHESIA/SEDATION: General - as administered by the Anesthesia department MEDICATIONS: Sodium Tetradecyl Sulfate 25m, lipiodol 226miv CONTRAST:  20078mSOVUE-300 IOPAMIDOL (ISOVUE-300) INJECTION 61% FLUOROSCOPY TIME:  Fluoroscopy Time: 61 min 36 sec [id number] COMPLICATIONS: None immediate. PROCEDURE: Informed written consent was obtained from the family after a thorough discussion of the procedural risks, benefits  and alternatives. All questions were addressed. Maximal Sterile Barrier Technique was utilized including caps, mask, sterile gowns, sterile gloves, sterile drape, hand hygiene and skin antiseptic. A timeout was performed prior to the initiation of the procedure. The skin overlying the right upper abdominal quadrant as well as the right neck and femoral region were prepped and draped  in usual sterile fashion. Initial ultrasound scanning demonstrates a small amount of perihepatic ascites ; no paracentesis was indicated. Under direct ultrasound guidance, a peripheral aspect of the right portal vein was accessed with a 22 gauge needle. Ultrasound image was saved for procedural documentation purposes. The track was dilated with the inner 3 French catheter from the [place] set. Several contrast limited portal venograms were performed. A Nitrex wire was advanced through the 3 [place] catheter with the radiopaque transition positioned at the target entrance to the peripheral right portal vein. Next, the right internal jugular vein was accessed under direct ultrasound. Ultrasound image was saved for procedural documentation purposes. This allowed for placement of the 10 French TIPS vascular sheath. With the aid of angiographic guidewires, an MPA catheter was utilized to select the right hepatic vein and a hepatic venogram was performed. Under fluoroscopic guidance, the right portal vein with targeted with a Rsch-Uchida TIPS needle directed at the radiopaque target within the right portal vein. Ultimately, the right portal vein was accessed at a desirable location allowing advancement of a stiff Glidewire into the main portal vein. A 4 French glide catheter was advanced over the stiff Glidewire and a portal venogram was performed. Next, as there was difficulty advancing a measuring catheter through the intrahepatic track, the track was dilated with a 4 mm Mustang balloon, allowing advancement measuring pigtail into the right portal vein. A portal venogram was performed with a measuring Omni Flush catheter. Tract further dilated with 8 mm Conquest balloon to facilitate advancement of the sheath into the main portal vein. The percutaneous portal microcatheter and wire were removed. Next, a 2 cm (un covered) x 6 cm (covered) x 10 mm GORE VIATORR TIPS Endoprosthesis was advanced through the intra portal  track and deployed. The TIPS stent was angioplastied in multiple stations to 10 mm diameter. Follow-up portal venogram demonstrated good flow, no residual stenosis. There is continued retrograde collateral flow through dilated left gastric vein to supply the gastric varices, ultimately draining for retroperitoneal collaterals into the left renal vein. A 5 French angiographic catheter was advanced into the dilated left gastric vein with the aid of an Amplatz Glidewire. Under direct ultrasound guidance, the right common femoral vein was accessed with a micropuncture kit. An ultrasound image was saved for procedural documentation purposes. Over a Bentson wire, the track was dilated allowing for placement of a 10 French TIPS sheath to the level of the mid IVC under intermittent fluoroscopic guidance. A Cobra catheter was utilized to select the left renal vein and a left renal venogram was performed. A Rosen wire was advanced into the caudal pole of the left kidney as the access sheath was advanced to the origin of left renal vein. Next, a 4 French angled glide catheter was cannulated within the sheath, next to the [place] wire, and with the use of a regular glidewire, was utilized to select the outflow origin of the hypertrophied gastric varix. The Rosen wire was then removed and over a coiled Amplatz wire, the vascular sheath was advanced into the outflow of the splenorenal shunt. Contrast injection confirmed appropriate positioning. The left gastric vein catheter was exchanged  for a 100 cm Renato Battles scientific occlusion balloon. A 65 cm [place] scientific occlusion balloon was then advanced into the caudal aspect of the outflow of the splenorenal shunt. Inflation of the left gastric balloon and subsequent injection confirmed stasis of flow in the gastric varix complex. Inflation of the splenorenal shunt balloon pulled caudal to near vessel's confluence with the left renal veinwith subsequent left gastric injection confirmed  stasis of flow in the gastric varix complex. Next, a Lantern micro catheter was advanced through the splenorenal occlusion balloon into the caudal aspect of the markedly hypertrophied shunt. Venography confirmed appropriate positioning. A second Lantern microcatheter was advanced through the left gastric venous occlusion balloon into the left gastric vein. The left gastric occlusion balloon was inflated. At this time, sclerosant was administered (in a mixture of 2 cc of Lipiodol, 4 cc of STS and 6 cc of air) under fluoroscopic guidance. Multiple spot fluoroscopic and radiographic images were obtained during the sclerosant administration. Once sclerosis and was noted into the proximal aspect of the splenorenal shunt, the inferior balloon was inflated. At this time multiple overlapping Ruby coils were deployed within the left renal vein and subsequently within the caudal aspect of the splenorenral shunt. A completion left renal venogram was performed and the procedure was terminated. Follow-up splenic venogram confirms no further flow through the gastric variceal complex. Good antegrade flow through the TIPS shunt. Continued hepatopetal antegrade perfusion through the left portal vein. All wires, catheters and sheaths were removed from the patient. Hemostasis was achieved at the right IJ and groin access sites with manual compression. A dressing was placed. The patient tolerated the procedure well. Patient transferred to the PACU. Operators:  Henn/ Hassell/ Watts IMPRESSION: 1. Successful creation of a TIPS with reduction of portosystemic gradient to 3 mmHg. 2. Successful ultrasound-guided paracentesis yielding 13 L of ascitic fluid. PLAN: - Continued in-patient management as per the providing clinical service. - Followup post-BATO abd CT  when clinically stable - Patient be seen in follow-up consultation at the interventional radiology clinic with postprocedural TIPS ultrasound and labs (CMP, INR and ammonia levels)  in 4-6 weeks. Electronically Signed   By: Lucrezia Europe M.D.   On: 04/05/2017 11:43   Ir Venogram Renal Uni Right  Result Date: 04/05/2017 CLINICAL DATA:  Cirrhosis with gastric varices. Acute hemorrhage and hemodynamic instability. See previous consultation by Dr. Anselm Pancoast. EXAM: 1. TRANSJUGULAR INTRAHEPATIC PORTOSYSTEMIC SHUNT 2. IR PARACENTESIS 1. ULTRASOUND GUIDANCE FOR VENOUS ACCESS x2 2. TRANSJUGLAR INTRHEPATIC PORTOSYSTEMIC SHUNT (TIPS) CREATION 3. SELECTIVE GASTRIC VARICEAL VENOGRAM 4. PERCUTANEOUS SCLEROSANT EMBOLIZATION ANESTHESIA/SEDATION: General - as administered by the Anesthesia department MEDICATIONS: Sodium Tetradecyl Sulfate 104m, lipiodol 26miv CONTRAST:  20080mSOVUE-300 IOPAMIDOL (ISOVUE-300) INJECTION 61% FLUOROSCOPY TIME:  Fluoroscopy Time: 61 min 36 sec 1.21.062 COMPLICATIONS: None immediate. PROCEDURE: Informed written consent was obtained from the family after a thorough discussion of the procedural risks, benefits and alternatives. All questions were addressed. Maximal Sterile Barrier Technique was utilized including caps, mask, sterile gowns, sterile gloves, sterile drape, hand hygiene and skin antiseptic. A timeout was performed prior to the initiation of the procedure. The skin overlying the right upper abdominal quadrant as well as the right neck and femoral region were prepped and draped in usual sterile fashion. Initial ultrasound scanning demonstrates a small amount of perihepatic ascites ; no paracentesis was indicated. Under direct ultrasound guidance, a peripheral aspect of the right portal vein was accessed with a 22 gauge needle. Ultrasound image was saved for procedural documentation purposes.  The track was dilated with the inner 3 French catheter from the [place] set. Several contrast limited portal venograms were performed. A Nitrex wire was advanced through the 3 [place] catheter with the radiopaque transition positioned at the target entrance to the peripheral right portal  vein. Next, the right internal jugular vein was accessed under direct ultrasound. Ultrasound image was saved for procedural documentation purposes. This allowed for placement of the 10 French TIPS vascular sheath. With the aid of angiographic guidewires, an MPA catheter was utilized to select the right hepatic vein and a hepatic venogram was performed. Under fluoroscopic guidance, the right portal vein with targeted with a Rsch-Uchida TIPS needle directed at the radiopaque target within the right portal vein. Ultimately, the right portal vein was accessed at a desirable location allowing advancement of a stiff Glidewire into the main portal vein. A 4 French glide catheter was advanced over the stiff Glidewire and a portal venogram was performed. Next, as there was difficulty advancing a measuring catheter through the intrahepatic track, the track was dilated with a 4 mm Mustang balloon, allowing advancement measuring pigtail into the right portal vein. A portal venogram was performed with a measuring Omni Flush catheter. Tract further dilated with 8 mm Conquest balloon to facilitate advancement of the sheath into the main portal vein. The percutaneous portal microcatheter and wire were removed. Next, a 2 cm (un covered) x 6 cm (covered) x 10 mm GORE VIATORR TIPS Endoprosthesis was advanced through the intra portal track and deployed. The TIPS stent was angioplastied in multiple stations to 10 mm diameter. Follow-up portal venogram demonstrated good flow, no residual stenosis. There is continued retrograde collateral flow through dilated left gastric vein to supply the gastric varices, ultimately draining for retroperitoneal collaterals into the left renal vein. A 5 French angiographic catheter was advanced into the dilated left gastric vein with the aid of an Amplatz Glidewire. Under direct ultrasound guidance, the right common femoral vein was accessed with a micropuncture kit. An ultrasound image was saved for  procedural documentation purposes. Over a Bentson wire, the track was dilated allowing for placement of a 10 French TIPS sheath to the level of the mid IVC under intermittent fluoroscopic guidance. A Cobra catheter was utilized to select the left renal vein and a left renal venogram was performed. A Rosen wire was advanced into the caudal pole of the left kidney as the access sheath was advanced to the origin of left renal vein. Next, a 4 French angled glide catheter was cannulated within the sheath, next to the [place] wire, and with the use of a regular glidewire, was utilized to select the outflow origin of the hypertrophied gastric varix. The Rosen wire was then removed and over a coiled Amplatz wire, the vascular sheath was advanced into the outflow of the splenorenal shunt. Contrast injection confirmed appropriate positioning. The left gastric vein catheter was exchanged for a 100 cm Renato Battles scientific occlusion balloon. A 65 cm [place] scientific occlusion balloon was then advanced into the caudal aspect of the outflow of the splenorenal shunt. Inflation of the left gastric balloon and subsequent injection confirmed stasis of flow in the gastric varix complex. Inflation of the splenorenal shunt balloon pulled caudal to near vessel's confluence with the left renal veinwith subsequent left gastric injection confirmed stasis of flow in the gastric varix complex. Next, a Lantern micro catheter was advanced through the splenorenal occlusion balloon into the caudal aspect of the markedly hypertrophied shunt. Venography confirmed appropriate positioning. A second  Lantern microcatheter was advanced through the left gastric venous occlusion balloon into the left gastric vein. The left gastric occlusion balloon was inflated. At this time, sclerosant was administered (in a mixture of 2 cc of Lipiodol, 4 cc of STS and 6 cc of air) under fluoroscopic guidance. Multiple spot fluoroscopic and radiographic images were obtained  during the sclerosant administration. Once sclerosis and was noted into the proximal aspect of the splenorenal shunt, the inferior balloon was inflated. At this time multiple overlapping Ruby coils were deployed within the left renal vein and subsequently within the caudal aspect of the splenorenral shunt. A completion left renal venogram was performed and the procedure was terminated. Follow-up splenic venogram confirms no further flow through the gastric variceal complex. Good antegrade flow through the TIPS shunt. Continued hepatopetal antegrade perfusion through the left portal vein. All wires, catheters and sheaths were removed from the patient. Hemostasis was achieved at the right IJ and groin access sites with manual compression. A dressing was placed. The patient tolerated the procedure well. Patient transferred to the PACU. Operators:  Henn/ Hassell/ Watts IMPRESSION: 1. Successful creation of a TIPS with reduction of portosystemic gradient to 3 mmHg. 2. Successful ultrasound-guided paracentesis yielding 13 L of ascitic fluid. PLAN: - Continued in-patient management as per the providing clinical service. - Followup post-BATO abd CT  when clinically stable - Patient be seen in follow-up consultation at the interventional radiology clinic with postprocedural TIPS ultrasound and labs (CMP, INR and ammonia levels) in 4-6 weeks. Electronically Signed   By: Lucrezia Europe M.D.   On: 04/05/2017 11:43   Ir Tips  Result Date: 04/05/2017 CLINICAL DATA:  Cirrhosis with gastric varices. Acute hemorrhage and hemodynamic instability. See previous consultation by Dr. Anselm Pancoast. EXAM: 1. TRANSJUGULAR INTRAHEPATIC PORTOSYSTEMIC SHUNT 2. IR PARACENTESIS 1. ULTRASOUND GUIDANCE FOR VENOUS ACCESS x2 2. TRANSJUGLAR INTRHEPATIC PORTOSYSTEMIC SHUNT (TIPS) CREATION 3. SELECTIVE GASTRIC VARICEAL VENOGRAM 4. PERCUTANEOUS SCLEROSANT EMBOLIZATION ANESTHESIA/SEDATION: General - as administered by the Anesthesia department MEDICATIONS: Sodium  Tetradecyl Sulfate 50m, lipiodol 243miv CONTRAST:  20023mSOVUE-300 IOPAMIDOL (ISOVUE-300) INJECTION 61% FLUOROSCOPY TIME:  Fluoroscopy Time: 61 min 36 sec 1.20.923 COMPLICATIONS: None immediate. PROCEDURE: Informed written consent was obtained from the family after a thorough discussion of the procedural risks, benefits and alternatives. All questions were addressed. Maximal Sterile Barrier Technique was utilized including caps, mask, sterile gowns, sterile gloves, sterile drape, hand hygiene and skin antiseptic. A timeout was performed prior to the initiation of the procedure. The skin overlying the right upper abdominal quadrant as well as the right neck and femoral region were prepped and draped in usual sterile fashion. Initial ultrasound scanning demonstrates a small amount of perihepatic ascites ; no paracentesis was indicated. Under direct ultrasound guidance, a peripheral aspect of the right portal vein was accessed with a 22 gauge needle. Ultrasound image was saved for procedural documentation purposes. The track was dilated with the inner 3 French catheter from the AccSacramentot. Several contrast limited portal venograms were performed. A Nitrex wire was advanced through the 3 FrePakistantheter with the radiopaque transition positioned at the target entrance to the peripheral right portal vein. Next, the right internal jugular vein was accessed under direct ultrasound. Ultrasound image was saved for procedural documentation purposes. This allowed for placement of the 10 French TIPS vascular sheath. With the aid of angiographic guidewires, an MPA catheter was utilized to select the right hepatic vein and a hepatic venogram was performed. Under fluoroscopic guidance, the right portal vein with  targeted with a Rsch-Uchida TIPS needle directed at the radiopaque target within the right portal vein. Ultimately, the right portal vein was accessed at a desirable location allowing advancement of a stiff Glidewire  into the main portal vein. A 4 French glide catheter was advanced over the stiff Glidewire and a portal venogram was performed. Next, as there was difficulty advancing a measuring catheter through the intrahepatic track, the track was dilated with a 4 mm Mustang balloon, allowing advancement measuring pigtail into the right portal vein. A portal venogram was performed with a measuring Omni Flush catheter. Tract further dilated with 8 mm Conquest balloon to facilitate advancement of the sheath into the main portal vein. The percutaneous portal microcatheter and wire were removed. Next, a 2 cm (un covered) x 6 cm (covered) x 10 mm GORE VIATORR TIPS Endoprosthesis was advanced through the intra portal track and deployed. The TIPS stent was angioplastied in multiple stations to 10 mm diameter. Follow-up portal venogram demonstrated good flow, no residual stenosis. There is continued retrograde collateral flow through dilated left gastric vein to supply the gastric varices, ultimately draining for retroperitoneal collaterals into the left renal vein. A 5 French angiographic catheter was advanced into the dilated left gastric vein with the aid of an Amplatz Glidewire. Under direct ultrasound guidance, the right common femoral vein was accessed with a micropuncture kit. An ultrasound image was saved for procedural documentation purposes. Over a Bentson wire, the track was dilated allowing for placement of a 10 French TIPS sheath to the level of the mid IVC under intermittent fluoroscopic guidance. A Cobra catheter was utilized to select the left renal vein and a left renal venogram was performed. A Rosen wire was advanced into the caudal pole of the left kidney as the access sheath was advanced to the origin of left renal vein. Next, a 4 French angled glide catheter was cannulated within the sheath, next to the [place] wire, and with the use of a regular glidewire, was utilized to select the outflow origin of the  hypertrophied gastric varix. The Rosen wire was then removed and over a coiled Amplatz wire, the vascular sheath was advanced into the outflow of the splenorenal shunt. Contrast injection confirmed appropriate positioning. The left gastric vein catheter was exchanged for a 100 cm Renato Battles scientific occlusion balloon. A 65 cm [place] scientific occlusion balloon was then advanced into the caudal aspect of the outflow of the splenorenal shunt. Inflation of the left gastric balloon and subsequent injection confirmed stasis of flow in the gastric varix complex. Inflation of the splenorenal shunt balloon pulled caudal to near vessel's confluence with the left renal veinwith subsequent left gastric injection confirmed stasis of flow in the gastric varix complex. Next, a Lantern micro catheter was advanced through the splenorenal occlusion balloon into the caudal aspect of the markedly hypertrophied shunt. Venography confirmed appropriate positioning. A second Lantern microcatheter was advanced through the left gastric venous occlusion balloon into the left gastric vein. The left gastric occlusion balloon was inflated. At this time, sclerosant was administered (in a mixture of 2 cc of Lipiodol, 4 cc of STS and 6 cc of air) under fluoroscopic guidance. Multiple spot fluoroscopic and radiographic images were obtained during the sclerosant administration. Once sclerosis and was noted into the proximal aspect of the splenorenal shunt, the inferior balloon was inflated. At this time multiple overlapping Ruby coils were deployed within the left renal vein and subsequently within the caudal aspect of the splenorenral shunt. A completion left renal  venogram was performed and the procedure was terminated. Follow-up splenic venogram confirms no further flow through the gastric variceal complex. Good antegrade flow through the TIPS shunt. Continued hepatopetal antegrade perfusion through the left portal vein. All wires, catheters and  sheaths were removed from the patient. Hemostasis was achieved at the right IJ and groin access sites with manual compression. A dressing was placed. The patient tolerated the procedure well. Patient transferred to the PACU. Operators:  Henn/ Hassell/ Watts IMPRESSION: 1. Successful creation of a TIPS with reduction of portosystemic gradient to 3 mmHg. 2. Successful ultrasound-guided paracentesis yielding 13 L of ascitic fluid. PLAN: - Continued in-patient management as per the providing clinical service. - Followup post-BATO abd CT  when clinically stable - Patient be seen in follow-up consultation at the interventional radiology clinic with postprocedural TIPS ultrasound and labs (CMP, INR and ammonia levels) in 4-6 weeks. Electronically Signed   By: Lucrezia Europe M.D.   On: 04/05/2017 11:43   Ir US Guide Vasc Access Right  Result Date: 04/05/2017 CLINICAL DATA:  Cirrhosis with gastric varices. Acute hemorrhage and hemodynamic instability. See previous consultation by Dr. Anselm Pancoast. EXAM: 1. TRANSJUGULAR INTRAHEPATIC PORTOSYSTEMIC SHUNT 2. IR PARACENTESIS 1. ULTRASOUND GUIDANCE FOR VENOUS ACCESS x2 2. TRANSJUGLAR INTRHEPATIC PORTOSYSTEMIC SHUNT (TIPS) CREATION 3. SELECTIVE GASTRIC VARICEAL VENOGRAM 4. PERCUTANEOUS SCLEROSANT EMBOLIZATION ANESTHESIA/SEDATION: General - as administered by the Anesthesia department MEDICATIONS: Sodium Tetradecyl Sulfate 70m, lipiodol 237miv CONTRAST:  20025mSOVUE-300 IOPAMIDOL (ISOVUE-300) INJECTION 61% FLUOROSCOPY TIME:  Fluoroscopy Time: 61 min 36 sec 1.25.400 COMPLICATIONS: None immediate. PROCEDURE: Informed written consent was obtained from the family after a thorough discussion of the procedural risks, benefits and alternatives. All questions were addressed. Maximal Sterile Barrier Technique was utilized including caps, mask, sterile gowns, sterile gloves, sterile drape, hand hygiene and skin antiseptic. A timeout was performed prior to the initiation of the procedure. The skin  overlying the right upper abdominal quadrant as well as the right neck and femoral region were prepped and draped in usual sterile fashion. Initial ultrasound scanning demonstrates a small amount of perihepatic ascites ; no paracentesis was indicated. Under direct ultrasound guidance, a peripheral aspect of the right portal vein was accessed with a 22 gauge needle. Ultrasound image was saved for procedural documentation purposes. The track was dilated with the inner 3 French catheter from the AccZearingt. Several contrast limited portal venograms were performed. A Nitrex wire was advanced through the 3 FrePakistantheter with the radiopaque transition positioned at the target entrance to the peripheral right portal vein. Next, the right internal jugular vein was accessed under direct ultrasound. Ultrasound image was saved for procedural documentation purposes. This allowed for placement of the 10 French TIPS vascular sheath. With the aid of angiographic guidewires, an MPA catheter was utilized to select the right hepatic vein and a hepatic venogram was performed. Under fluoroscopic guidance, the right portal vein with targeted with a Rsch-Uchida TIPS needle directed at the radiopaque target within the right portal vein. Ultimately, the right portal vein was accessed at a desirable location allowing advancement of a stiff Glidewire into the main portal vein. A 4 French glide catheter was advanced over the stiff Glidewire and a portal venogram was performed. Next, as there was difficulty advancing a measuring catheter through the intrahepatic track, the track was dilated with a 4 mm Mustang balloon, allowing advancement measuring pigtail into the right portal vein. A portal venogram was performed with a measuring Omni Flush catheter. Tract further dilated with 8  mm Conquest balloon to facilitate advancement of the sheath into the main portal vein. The percutaneous portal microcatheter and wire were removed. Next, a 2  cm (un covered) x 6 cm (covered) x 10 mm GORE VIATORR TIPS Endoprosthesis was advanced through the intra portal track and deployed. The TIPS stent was angioplastied in multiple stations to 10 mm diameter. Follow-up portal venogram demonstrated good flow, no residual stenosis. There is continued retrograde collateral flow through dilated left gastric vein to supply the gastric varices, ultimately draining for retroperitoneal collaterals into the left renal vein. A 5 French angiographic catheter was advanced into the dilated left gastric vein with the aid of an Amplatz Glidewire. Under direct ultrasound guidance, the right common femoral vein was accessed with a micropuncture kit. An ultrasound image was saved for procedural documentation purposes. Over a Bentson wire, the track was dilated allowing for placement of a 10 French TIPS sheath to the level of the mid IVC under intermittent fluoroscopic guidance. A Cobra catheter was utilized to select the left renal vein and a left renal venogram was performed. A Rosen wire was advanced into the caudal pole of the left kidney as the access sheath was advanced to the origin of left renal vein. Next, a 4 French angled glide catheter was cannulated within the sheath, next to the [place] wire, and with the use of a regular glidewire, was utilized to select the outflow origin of the hypertrophied gastric varix. The Rosen wire was then removed and over a coiled Amplatz wire, the vascular sheath was advanced into the outflow of the splenorenal shunt. Contrast injection confirmed appropriate positioning. The left gastric vein catheter was exchanged for a 100 cm Renato Battles scientific occlusion balloon. A 65 cm [place] scientific occlusion balloon was then advanced into the caudal aspect of the outflow of the splenorenal shunt. Inflation of the left gastric balloon and subsequent injection confirmed stasis of flow in the gastric varix complex. Inflation of the splenorenal shunt balloon  pulled caudal to near vessel's confluence with the left renal veinwith subsequent left gastric injection confirmed stasis of flow in the gastric varix complex. Next, a Lantern micro catheter was advanced through the splenorenal occlusion balloon into the caudal aspect of the markedly hypertrophied shunt. Venography confirmed appropriate positioning. A second Lantern microcatheter was advanced through the left gastric venous occlusion balloon into the left gastric vein. The left gastric occlusion balloon was inflated. At this time, sclerosant was administered (in a mixture of 2 cc of Lipiodol, 4 cc of STS and 6 cc of air) under fluoroscopic guidance. Multiple spot fluoroscopic and radiographic images were obtained during the sclerosant administration. Once sclerosis and was noted into the proximal aspect of the splenorenal shunt, the inferior balloon was inflated. At this time multiple overlapping Ruby coils were deployed within the left renal vein and subsequently within the caudal aspect of the splenorenral shunt. A completion left renal venogram was performed and the procedure was terminated. Follow-up splenic venogram confirms no further flow through the gastric variceal complex. Good antegrade flow through the TIPS shunt. Continued hepatopetal antegrade perfusion through the left portal vein. All wires, catheters and sheaths were removed from the patient. Hemostasis was achieved at the right IJ and groin access sites with manual compression. A dressing was placed. The patient tolerated the procedure well. Patient transferred to the PACU. Operators:  Henn/ Hassell/ Watts IMPRESSION: 1. Successful creation of a TIPS with reduction of portosystemic gradient to 3 mmHg. 2. Successful ultrasound-guided paracentesis yielding 13 L of  ascitic fluid. PLAN: - Continued in-patient management as per the providing clinical service. - Followup post-BATO abd CT  when clinically stable - Patient be seen in follow-up consultation  at the interventional radiology clinic with postprocedural TIPS ultrasound and labs (CMP, INR and ammonia levels) in 4-6 weeks. Electronically Signed   By: Lucrezia Europe M.D.   On: 04/05/2017 11:43   Dg Chest Port 1 View  Result Date: 04/05/2017 CLINICAL DATA:  Respiratory failure. EXAM: PORTABLE CHEST 1 VIEW COMPARISON:  04/04/2017.  03/29/2017. FINDINGS: Endotracheal tube and right IJ line in stable position. Heart size normal. Diffuse severe bilateral pulmonary interstitial prominence consistent with interstitial edema and/or pneumonitis again noted without interim change. Small left pleural effusion. Biapical pleural thickening noted consistent scarring. Surgical coils noted in the upper abdomen. IMPRESSION: 1.  Lines and tubes in stable position. 2. Diffuse severe bilateral from interstitial prominence again noted consistent with interstitial edema and/or pneumonitis. No interim change. Small left pleural effusion. Electronically Signed   By: Marcello Moores  Register   On: 04/05/2017 07:01   Ir Ileana Ladd Art  [place] Guide Roadmapping  Result Date: 04/05/2017 CLINICAL DATA:  Cirrhosis with gastric varices. Acute hemorrhage and hemodynamic instability. See previous consultation by Dr. Anselm Pancoast. EXAM: 1. TRANSJUGULAR INTRAHEPATIC PORTOSYSTEMIC SHUNT 2. IR PARACENTESIS 1. ULTRASOUND GUIDANCE FOR VENOUS ACCESS x2 2. TRANSJUGLAR INTRHEPATIC PORTOSYSTEMIC SHUNT (TIPS) CREATION 3. SELECTIVE GASTRIC VARICEAL VENOGRAM 4. PERCUTANEOUS SCLEROSANT EMBOLIZATION ANESTHESIA/SEDATION: General - as administered by the Anesthesia department MEDICATIONS: Sodium Tetradecyl Sulfate 73m, lipiodol 217miv CONTRAST:  20052mSOVUE-300 IOPAMIDOL (ISOVUE-300) INJECTION 61% FLUOROSCOPY TIME:  Fluoroscopy Time: 61 min 36 sec 1.20.277 COMPLICATIONS: None immediate. PROCEDURE: Informed written consent was obtained from the family after a thorough discussion of the procedural risks, benefits and alternatives. All questions were  addressed. Maximal Sterile Barrier Technique was utilized including caps, mask, sterile gowns, sterile gloves, sterile drape, hand hygiene and skin antiseptic. A timeout was performed prior to the initiation of the procedure. The skin overlying the right upper abdominal quadrant as well as the right neck and femoral region were prepped and draped in usual sterile fashion. Initial ultrasound scanning demonstrates a small amount of perihepatic ascites ; no paracentesis was indicated. Under direct ultrasound guidance, a peripheral aspect of the right portal vein was accessed with a 22 gauge needle. Ultrasound image was saved for procedural documentation purposes. The track was dilated with the inner 3 French catheter from the Acc[place]. Several contrast limited portal venograms were performed. A Nitrex wire was advanced through the 3 FrePakistantheter with the radiopaque transition positioned at the target entrance to the peripheral right portal vein. Next, the right internal jugular vein was accessed under direct ultrasound. Ultrasound image was saved for procedural documentation purposes. This allowed for placement of the 10 French TIPS vascular sheath. With the aid of angiographic guidewires, an MPA catheter was utilized to select the right hepatic vein and a hepatic venogram was performed. Under fluoroscopic guidance, the right portal vein with targeted with a Rsch-Uchida TIPS needle directed at the radiopaque target within the right portal vein. Ultimately, the right portal vein was accessed at a desirable location allowing advancement of a stiff Glidewire into the main portal vein. A 4 French glide catheter was advanced over the stiff Glidewire and a portal venogram was performed. Next, as there was difficulty advancing a measuring catheter through the intrahepatic track, the track was dilated with a 4 mm Mustang balloon, allowing advancement measuring pigtail into  the right portal vein. A portal venogram was  performed with a measuring Omni Flush catheter. Tract further dilated with 8 mm Conquest balloon to facilitate advancement of the sheath into the main portal vein. The percutaneous portal microcatheter and wire were removed. Next, a 2 cm (un covered) x 6 cm (covered) x 10 mm GORE VIATORR TIPS Endoprosthesis was advanced through the intra portal track and deployed. The TIPS stent was angioplastied in multiple stations to 10 mm diameter. Follow-up portal venogram demonstrated good flow, no residual stenosis. There is continued retrograde collateral flow through dilated left gastric vein to supply the gastric varices, ultimately draining for retroperitoneal collaterals into the left renal vein. A 5 French angiographic catheter was advanced into the dilated left gastric vein with the aid of an Amplatz Glidewire. Under direct ultrasound guidance, the right common femoral vein was accessed with a micropuncture kit. An ultrasound image was saved for procedural documentation purposes. Over a Bentson wire, the track was dilated allowing for placement of a 10 French TIPS sheath to the level of the mid IVC under intermittent fluoroscopic guidance. A Cobra catheter was utilized to select the left renal vein and a left renal venogram was performed. A Rosen wire was advanced into the caudal pole of the left kidney as the access sheath was advanced to the origin of left renal vein. Next, a 4 French angled glide catheter was cannulated within the sheath, next to the [place] wire, and with the use of a regular glidewire, was utilized to select the outflow origin of the hypertrophied gastric varix. The Rosen wire was then removed and over a coiled Amplatz wire, the vascular sheath was advanced into the outflow of the splenorenal shunt. Contrast injection confirmed appropriate positioning. The left gastric vein catheter was exchanged for a 100 cm Renato Battles scientific occlusion balloon. A 65 cm [place] scientific occlusion balloon was then  advanced into the caudal aspect of the outflow of the splenorenal shunt. Inflation of the left gastric balloon and subsequent injection confirmed stasis of flow in the gastric varix complex. Inflation of the splenorenal shunt balloon pulled caudal to near vessel's confluence with the left renal veinwith subsequent left gastric injection confirmed stasis of flow in the gastric varix complex. Next, a Lantern micro catheter was advanced through the splenorenal occlusion balloon into the caudal aspect of the markedly hypertrophied shunt. Venography confirmed appropriate positioning. A second Lantern microcatheter was advanced through the left gastric venous occlusion balloon into the left gastric vein. The left gastric occlusion balloon was inflated. At this time, sclerosant was administered (in a mixture of 2 cc of Lipiodol, 4 cc of STS and 6 cc of air) under fluoroscopic guidance. Multiple spot fluoroscopic and radiographic images were obtained during the sclerosant administration. Once sclerosis and was noted into the proximal aspect of the splenorenal shunt, the inferior balloon was inflated. At this time multiple overlapping Ruby coils were deployed within the left renal vein and subsequently within the caudal aspect of the splenorenral shunt. A completion left renal venogram was performed and the procedure was terminated. Follow-up splenic venogram confirms no further flow through the gastric variceal complex. Good antegrade flow through the TIPS shunt. Continued hepatopetal antegrade perfusion through the left portal vein. All wires, catheters and sheaths were removed from the patient. Hemostasis was achieved at the right IJ and groin access sites with manual compression. A dressing was placed. The patient tolerated the procedure well. Patient transferred to the PACU. Operators:  Henn/ Hassell/ Watts IMPRESSION: 1. Successful  creation of a TIPS with reduction of portosystemic gradient to 3 mmHg. 2. Successful  ultrasound-guided paracentesis yielding 13 L of ascitic fluid. PLAN: - Continued in-patient management as per the providing clinical service. - Followup post-BATO abd CT  when clinically stable - Patient be seen in follow-up consultation at the interventional radiology clinic with postprocedural TIPS ultrasound and labs (CMP, INR and ammonia levels) in 4-6 weeks. Electronically Signed   By: Lucrezia Europe M.D.   On: 04/05/2017 11:43   [place] Guide Roadmapping  Result Date: 04/05/2017 CLINICAL DATA:  Cirrhosis with gastric varices. Acute hemorrhage and hemodynamic instability. See previous consultation by Dr. Anselm Pancoast. EXAM: 1. TRANSJUGULAR INTRAHEPATIC PORTOSYSTEMIC SHUNT 2. IR PARACENTESIS 1. ULTRASOUND GUIDANCE FOR VENOUS ACCESS x2 2. TRANSJUGLAR INTRHEPATIC PORTOSYSTEMIC SHUNT (TIPS) CREATION 3. SELECTIVE GASTRIC VARICEAL VENOGRAM 4. PERCUTANEOUS SCLEROSANT EMBOLIZATION ANESTHESIA/SEDATION: General - as administered by the Anesthesia department MEDICATIONS: Sodium Tetradecyl Sulfate 42m, lipiodol 25miv CONTRAST:  20047mSOVUE-300 IOPAMIDOL (ISOVUE-300) INJECTION 61% FLUOROSCOPY TIME:  Fluoroscopy Time: 61 min 36 sec 1.28.315 COMPLICATIONS: None immediate. PROCEDURE: Informed written consent was obtained from the family after a thorough discussion of the procedural risks, benefits and alternatives. All questions were addressed. Maximal Sterile Barrier Technique was utilized including caps, mask, sterile gowns, sterile gloves, sterile drape, hand hygiene and skin antiseptic. A timeout was performed prior to the initiation of the procedure. The skin overlying the right upper abdominal quadrant as well as the right neck and femoral region were prepped and draped in usual sterile fashion. Initial ultrasound scanning demonstrates a small amount of perihepatic ascites ; no paracentesis was indicated. Under direct ultrasound guidance, a peripheral aspect of the right portal vein was  accessed with a 22 gauge needle. Ultrasound image was saved for procedural documentation purposes. The track was dilated with the inner 3 French catheter from the [place]. Several contrast limited portal venograms were performed. A Nitrex wire was advanced through the 3 FrePakistantheter with the radiopaque transition positioned at the target entrance to the peripheral right portal vein. Next, the right internal jugular vein was accessed under direct ultrasound. Ultrasound image was saved for procedural documentation purposes. This allowed for placement of the 10 French TIPS vascular sheath. With the aid of angiographic guidewires, an MPA catheter was utilized to select the right hepatic vein and a hepatic venogram was performed. Under fluoroscopic guidance, the right portal vein with targeted with a Rsch-Uchida TIPS needle directed at the radiopaque target within the right portal vein. Ultimately, the right portal vein was accessed at a desirable location allowing advancement of a stiff Glidewire into the main portal vein. A 4 French glide catheter was advanced over the stiff Glidewire and a portal venogram was performed. Next, as there was difficulty advancing a measuring catheter through the intrahepatic track, the track was dilated with a 4 mm Mustang balloon, allowing advancement measuring pigtail into the right portal vein. A portal venogram was performed with a measuring Omni Flush catheter. Tract further dilated with 8 mm Conquest balloon to facilitate advancement of the sheath into the main portal vein. The percutaneous portal microcatheter and wire were removed. Next, a 2 cm (un covered) x 6 cm (covered) x 10 mm GORE VIATORR TIPS Endoprosthesis was advanced through the intra portal track and deployed. The TIPS stent was angioplastied in multiple stations to 10 mm diameter. Follow-up portal venogram demonstrated good flow, no residual stenosis. There is continued retrograde collateral flow through  dilated left gastric vein to supply the gastric varices, ultimately draining for retroperitoneal collaterals into the left renal vein. A 5 French angiographic catheter was advanced into the dilated left gastric vein with the aid of an Amplatz Glidewire. Under direct ultrasound guidance, the right common femoral vein was accessed with a micropuncture kit. An ultrasound image was saved for procedural documentation purposes. Over a Bentson wire, the track was dilated allowing for placement of a 10 French TIPS sheath to the level of the mid IVC under intermittent fluoroscopic guidance. A Cobra catheter was utilized to select the left renal vein and a left renal venogram was performed. A Rosen wire was advanced into the caudal pole of the left kidney as the access sheath was advanced to the origin of left renal vein. Next, a 4 French angled glide catheter was cannulated within the sheath, next to the Sunnyslope wire, and with the use of a regular glidewire, was utilized to select the outflow origin of the hypertrophied gastric varix. The Rosen wire was then removed and over a coiled Amplatz wire, the vascular sheath was advanced into the outflow of the splenorenal shunt. Contrast injection confirmed appropriate positioning. The left gastric vein catheter was exchanged for a 100 cm Renato Battles scientific occlusion balloon. A 65 cm Boston scientific occlusion balloon was then advanced into the caudal aspect of the outflow of the splenorenal shunt. Inflation of the left gastric balloon and subsequent injection confirmed stasis of flow in the gastric varix complex. Inflation of the splenorenal shunt balloon pulled caudal to near vessel's confluence with the left renal veinwith subsequent left gastric injection confirmed stasis of flow in the gastric varix complex. Next, a Lantern micro catheter was advanced through the splenorenal occlusion balloon into the caudal aspect of the markedly hypertrophied shunt. Venography confirmed  appropriate positioning. A second Lantern microcatheter was advanced through the left gastric venous occlusion balloon into the left gastric vein. The left gastric occlusion balloon was inflated. At this time, sclerosant was administered (in a mixture of 2 cc of Lipiodol, 4 cc of STS and 6 cc of air) under fluoroscopic guidance. Multiple spot fluoroscopic and radiographic images were obtained during the sclerosant administration. Once sclerosis and was noted into the proximal aspect of the splenorenal shunt, the inferior balloon was inflated. At this time multiple overlapping Ruby coils were deployed within the left renal vein and subsequently within the caudal aspect of the splenorenral shunt. A completion left renal venogram was performed and the procedure was terminated. Follow-up splenic venogram confirms no further flow through the gastric variceal complex. Good antegrade flow through the TIPS shunt. Continued hepatopetal antegrade perfusion through the left portal vein. All wires, catheters and sheaths were removed from the patient. Hemostasis was achieved at the right IJ and groin access sites with manual compression. A dressing was placed. The patient tolerated the procedure well. Patient transferred to the PACU. Operators:  Henn/ Hassell/ Watts IMPRESSION: 1. Successful creation of a TIPS with reduction of portosystemic gradient to 3 mmHg. 2. Successful ultrasound-guided paracentesis yielding 13 L of ascitic fluid. PLAN: - Continued in-patient management as per the providing clinical service. - Followup post-BATO abd CT  when clinically stable - Patient be seen in follow-up consultation at the interventional radiology clinic with postprocedural TIPS ultrasound and labs (CMP, INR and ammonia levels) in 4-6 weeks. Electronically Signed   By: Lucrezia Europe M.D.   On: 04/05/2017 11:43    Labs:  CBC:  Recent Labs  04/05/17 0430  04/06/17 0346  04/06/17 1805 04/07/17 0041 04/07/17 0356 04/08/17 0358    WBC 26.4*  --  21.1*  --   --   --  17.4* 14.6*  HGB 12.0*  < > 10.9*  < > 10.5* 10.6* 10.7* 10.8*  HCT 36.4*  < > 34.7*  < > 32.1* 34.1* 33.9* 35.2*  PLT 186  --  130*  --   --   --  131* 128*  < > = values in this interval not displayed.  COAGS:  Recent Labs  03/29/17 1855 03/31/17 0352 04/04/17 1530  INR 1.34 1.32 1.49    BMP:  Recent Labs  04/06/17 1200 04/07/17 0356 04/07/17 1422 04/08/17 0358  NA 138 142 143 144  K 3.3* 3.2* 3.3* 3.4*  CL 106 107 109 109  CO2 '25 26 26 27  ' GLUCOSE 120* 105* 118* 106*  BUN '10 13 14 16  ' CALCIUM 7.5* 7.5* 7.7* 7.5*  CREATININE 0.88 0.88 0.92 0.92  GFRNONAA >60 >60 >60 >60  GFRAA >60 >60 >60 >60    LIVER FUNCTION TESTS:  Recent Labs  03/29/17 1634 03/30/17 0228 04/01/17 0301 04/04/17 1918  BILITOT 1.3* 1.1 0.7 3.3*  AST 32 33 26 66*  ALT '27 23 18 25  ' ALKPHOS 86 70 52 63  PROT 6.0* 5.0* 4.2* 5.6*  ALBUMIN 3.4* 3.0* 2.3* 3.5    Assessment and Plan:  TIPs and BATO 5/3 in IR Will need follow up in IR OP Clinic 4-6 weeks with [place] TIPS protocol and labs He wil hear form scheduler for time and date.  Electronically Signed: Monia Sabal A 04/08/2017, 1:44 PM   I spent a total of 15 Minutes at the the patient's bedside AND on the patient's hospital floor or unit, greater than 50% of which was counseling/coordinating care for TIPS/BATO

## 2017-04-08 NOTE — Procedures (Signed)
Extubation Procedure Note  Patient Details:   Name: CHANZE TEAGLE DOB: [birth year] MRN: [id number]   Airway Documentation:     Evaluation  O2 sats: stable throughout Complications: No apparent complications Patient did tolerate procedure well. Bilateral Breath Sounds: Diminished   Yes  Placed to 4l/min [place] Incentive spirometer instructed 1.5L  Revonda Standard 04/08/2017, 10:09 AM

## 2017-04-08 NOTE — Progress Notes (Signed)
Fentanyl 100 cc wasted with witness Regan Rakers.

## 2017-04-08 NOTE — Progress Notes (Signed)
[facility] Attending Note  Presenting HPI:  66 y.o. male with alcoholic cirrhosis, portal gastropathy, and prior GI bleeds. Patient has had 3 admissions since January 2017 with GI bleeding. Last admitted 03/30/17 with recurrent GI bleed starting the day prior. Endorse bright red blood per rectum after a dark, tarry bowel movement. No known NSAID use or anticoagulants. Reportedly quit using alcohol 1 year ago. Patient was admitted and given packed red blood cells and FFP. Initially started on a PPI and octreotide. Difficult to localize bleeding source on CT imaging. Patient subsequently underwent BRTO on 5/4. Following colonoscopy on 4/30 patient had worsening hypotension. Patient was transfused 3 units packed red blood cells, given IV albumin, and started on phenylephrine infusion. Central line was placed as well as arterial line. PCCM was consulted for further management. Patient's blood pressure seemed to respond well to albumin, Nori Riis, and transfusion. He was transferred to the ICU but further declined after transfer requiring endotracheal intubation for respiratory insufficiency.  Subjective:  Patient tolerated spontaneous breathing trial today. No acute events overnight.  Review of Systems:  Unable to obtain given intubation & sedation.   Vent Mode: PRVC FiO2 (%):  [30 %-40 %] 30 % Set Rate:  [24 bmp] 24 bmp Vt Set:  [600 mL] 600 mL PEEP:  [5 cmH20] 5 cmH20 Pressure Support:  [5 cmH20] 5 cmH20 Plateau Pressure:  [14 cmH20-18 cmH20] 15 cmH20  Temp:  [96.8 F (36 C)-99.1 F (37.3 C)] 98.2 F (36.8 C) (05/07 0600) Pulse Rate:  [79-113] 79 (05/07 0600) Resp:  [12-32] 24 (05/07 0600) BP: (82-112)/(49-65) 98/59 (05/07 0600) SpO2:  [92 %-100 %] 100 % (05/07 0600) Arterial Line BP: (89-152)/(39-54) 119/47 (05/07 0600) FiO2 (%):  [30 %-40 %] 30 % (05/07 0336) Weight:  [103.5 kg (228 lb 2.8 oz)] 103.5 kg (228 lb 2.8 oz) (05/07 0349)  General:  Girlfriend at bedside.  Intubated. No distress. Integument:  Warm & dry. No rash on exposed skin. Bruising over left ankle.  HEENT:  Moist mucus memebranes. Endotracheal tube in place. Neurological:  Pupils symmetric. Moving all 4 extremities equally. Nods appropriately to questions. Grossly nonfocal. Musculoskeletal:  No joint effusion or erythema appreciated. Symmetric muscle bulk. Pulmonary:  Symmetric chest wall rise on ventilator. Coarse breath sounds bilaterally. Cardiovascular:  Regular rate. No appreciable JVD. Normal S1 & S2. Adding bilateral lower extremity edema. Telemetry:  Sinus rhythm. Abdomen:  Soft. Nondistended. Normoactive bowel sounds.  LINES/TUBES: OETT 5/3 >>> R IJ CVL 5/3 >>> Art Line 5/3 >>> Foley >>> PIV  CBC Latest Ref Rng & Units 04/08/2017 04/07/2017 04/07/2017  WBC 4.0 - 10.5 K/uL 14.6(H) 17.4(H) -  Hemoglobin 13.0 - 17.0 g/dL 10.8(L) 10.7(L) 10.6(L)  Hematocrit 39.0 - 52.0 % 35.2(L) 33.9(L) 34.1(L)  Platelets 150 - 400 K/uL 128(L) 131(L) -   BMP Latest Ref Rng & Units 04/08/2017 04/07/2017 04/07/2017  Glucose 65 - 99 mg/dL 106(H) 118(H) 105(H)  BUN 6 - 20 mg/dL '16 14 13  ' Creatinine 0.61 - 1.24 mg/dL 0.92 0.92 0.88  Sodium 135 - 145 mmol/L 144 143 142  Potassium 3.5 - 5.1 mmol/L 3.4(L) 3.3(L) 3.2(L)  Chloride 101 - 111 mmol/L 109 109 107  CO2 22 - 32 mmol/L '27 26 26  ' Calcium 8.9 - 10.3 mg/dL 7.5(L) 7.7(L) 7.5(L)    Hepatic Function Latest Ref Rng & Units 04/04/2017 04/01/2017 03/30/2017  Total Protein 6.5 - 8.1 g/dL 5.6(L) 4.2(L) 5.0(L)  Albumin 3.5 - 5.0 g/dL 3.5 2.3(L) 3.0(L)  AST 15 -  41 U/L 66(H) 26 33  ALT 17 - 63 U/L '25 18 23  ' Alk Phosphatase 38 - 126 U/L 63 52 70  Total Bilirubin 0.3 - 1.2 mg/dL 3.3(H) 0.7 1.1  Bilirubin, Direct 0.1 - 0.5 mg/dL 1.2(H) - -    IMAGING/STUDIES: TTE 5/4: LV normal in size with EF 45-50% & normal wall thickness. Hypokinesis of anterior, apical, and inferoapical walls. Insufficient study to assess diastolic function. LA mildly dilated. Main  pulmonary artery normal in size. Aortic valve poorly visualized but gradients suggestive of at least moderate aortic stenosis. Mild mitral stenosis. Trivial pulmonic regurgitation. Mild tricuspid regurgitation. No pericardial effusion. PORT CXR 5/4:  Personally reviewed by me. Film rotated left. Patchy bilateral lower lobe opacities as well as reticulation indicating edema. Endotracheal tube and right internal jugular central venous catheter in good position.  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 Normal duodenum. 04/30 - Colonoscopy demonstrated a large amount of fresh and old blood along with clots in the entire colon along with diverticulosis in the sigmoid and descending colon without active bleeding. Nonbleeding internal hemorrhoids. 05/02 - Capsule Endoscopy demonstrated small nonbleeding AVM in the mid small bowel with one nonspecific erosion in the small bowel. No evidence of blood in the small bowel or colon though poor visualization. 05/03 - S/P TIPS & BRTO  ASSESSMENT/PLAN:  66 y.o. male with acute hypoxic respiratory failure and shock secondary to acute blood loss anemia post blood product transfusion. Respiratory status continually improving with diuresis. Planning for probable extubation this morning.  1. Acute hypoxic respiratory failure: Multifactorial from resuscitation fluids as well as likely some element of transfusion related acute lung injury. 2. Shock: Secondary to acute blood loss, NSTEMI, & sedation. Weaning off pressors.  3. Acute blood loss anemia: Secondary to variceal bleed. No further signs of active bleeding. Hemoglobin stable.  Continuing to trend cell counts daily with CBC.  4. Variceal bleed: Status post TIPS & BRTO on 5/3. Appreciate input from GI & IR. Weaning off Octreotide drip.  5. Possible Sepsis:  Awaiting finalization of cultures. Trending leukocytosis. Continuing broad spectrum antibiotics with Vancomycin, Cefepime & Flagyl for now. Likely will discontinue vancomycin tomorrow. Trending Procalcitonin per algorithm. 6. NSTEMI: Multifactorial from patient's shock and acute blood loss anemia. Unable to anticoagulate or start antiplatelet therapy due to acute blood loss. Unable to start beta blocker due to hypotension. 7. COPD:  No signs of acute exacerbation. Continuing Pulmicort nebulized twice a day. Continuing Duonebs every 6 hours. 8. Hypokalemia:  Mild. Replaced. Trending electrolytes daily.  9. Acute systolic & Chronic Grade 2 diastolic congestive heart failure: Continuing Lasix IV every 8 hour. Unable to start beta blocker or ACE inhibitor with hypotension. 10. 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 Last alcoholic beverage was approximately one year ago. 11. Acute encephalopathy: Multifactorial. Likely secondary to cirrhosis. Resolving. Continuing thiamine & folic acid. Continuing lactulose enema twice a day.   Prophylaxis:  SCDs & Protonix IV q12hr.  Diet:  NPO. Plan for clear liquids after 4 hours once patient is extubated.  Code Status:  Full Code per previous physician discussions. Disposition:  Remains intubated in the ICU. Plan to transition out of ICU once patient is extubated &  off pressors. Family Update:  Girlfriend updated at bedside.   I have personally spent a total of 31 minutes of critical care time today caring for the patient, updating his girlfriend at bedside, & reviewing the aptient's electronic medical record.  Sonia Baller Ashok Cordia, M.D. [facility] Pager:  [phone] After 3pm or if no response, call [phone] 7:03 AM 04/08/17

## 2017-04-08 NOTE — Progress Notes (Signed)
Pharmacy Antibiotic Note  John Warren is a 66 y.o. male admitted on 03/29/2017 with possible pneumonia and/or TRALI.  Today is day #4 of vancomycin/flagyl/cefepime. WBC 14.6, afeb, LA 3.1. All cultures are negative to date.    Plan: Vancomycin 1 g IV every 12 hours (run via central line) - recommend discontinuing Cefepime 1 g IV q8h Flagyl 500 mg IV q8h  Monitor renal function and culture results Narrow therapy as able    Height: 5\' 11"  (180.3 cm) Weight: 228 lb 2.8 oz (103.5 kg) IBW/kg (Calculated) : 75.3  Temp (24hrs), Avg:98.6 F (37 C), Min:96.8 F (36 C), Max:99.1 F (37.3 C)   Recent Labs Lab 04/04/17 2348 04/04/17 2358 04/05/17 0430 04/05/17 0923  04/06/17 0346 04/06/17 1200 04/07/17 0356 04/07/17 1422 04/08/17 0358  WBC 24.2*  --  26.4*  --   --  21.1*  --  17.4*  --  14.6*  CREATININE  --   --  1.16  --   < > 0.96 0.88 0.88 0.92 0.92  LATICACIDVEN  --  2.9*  --  3.1*  --   --   --   --   --   --   < > = values in this interval not displayed.  Estimated Creatinine Clearance: 94.1 mL/min (by C-G formula based on SCr of 0.92 mg/dL).    Allergies  Allergen Reactions  . Penicillins Rash    Has patient had a PCN reaction causing immediate rash, facial/tongue/throat swelling, SOB or lightheadedness with hypotension: Yes Has patient had a PCN reaction causing severe rash involving mucus membranes or skin necrosis: No Has patient had a PCN reaction that required hospitalization: No Has patient had a PCN reaction occurring within the last 10 years: No If all of the above answers are "NO", then may proceed with Cephalosporin use. Had CTX before    Antimicrobials this admission:  Ceftriaxone 4/28 >> 5/2 Vancomycin 5/4 >> Cefepime 5/4 >> Flagyl 5/4 >>  Dose adjustments this admission:  N/A  Microbiology results:  4/28 Blood cx: neg 4/27 MRSA pcr: neg 5/4 Sputum: neg    Hughes Better, PharmD, BCPS Clinical Pharmacist 04/08/2017 8:19 AM

## 2017-04-08 NOTE — Progress Notes (Signed)
Progress Note  Patient Name: John Warren Date of Encounter: 04/08/2017  Primary Cardiologist: Dr. Wynonia Lawman  Subjective   Pt was extubated this morning. States that he feels a lot better. No chest pain. No dyspnea while resting in bed.   Inpatient Medications    Scheduled Meds: . budesonide (PULMICORT) nebulizer solution  0.5 mg Nebulization BID  . chlorhexidine gluconate (MEDLINE KIT)  15 mL Mouth Rinse BID  . Chlorhexidine Gluconate Cloth  6 each Topical Daily  . folic acid  1 mg Intravenous Daily  . furosemide  40 mg Intravenous Q8H  . ipratropium-albuterol  3 mL Nebulization Q6H  . lactulose  300 mL Rectal BID  . mouth rinse  15 mL Mouth Rinse QID  . pantoprazole (PROTONIX) IV  40 mg Intravenous Q12H  . rifaximin  550 mg Per Tube BID  . rocuronium  50 mg Intravenous Once  . sodium chloride flush  10-40 mL Intracatheter Q12H  . thiamine  100 mg Intravenous Daily   Continuous Infusions: . sodium chloride 10 mL/hr at 04/08/17 0600  . sodium chloride 10 mL/hr at 04/08/17 0600  . ceFEPime (MAXIPIME) IV 1 g (04/08/17 0629)  . fentaNYL infusion INTRAVENOUS Stopped (04/07/17 0826)  . metronidazole Stopped (04/08/17 0130)  . phenylephrine 36m/500mL (0.163mmL) infusion 125 mcg/min (04/08/17 0800)  . potassium phosphate IVPB (mmol) 24 mmol (04/08/17 0630)  . vancomycin (VANCOCIN) IVPB 1000 mg/100 mL central line Stopped (04/08/17 0340)   PRN Meds: acetaminophen **OR** acetaminophen, diphenhydrAMINE, fentaNYL, midazolam, midazolam, sodium chloride flush   Vital Signs    Vitals:   04/08/17 0730 04/08/17 0734 04/08/17 0800 04/08/17 0900  BP:   109/64 (!) 98/54  Pulse:   (!) 111 90  Resp:   (!) 21 (!) 8  Temp:   98.4 F (36.9 C) 98.4 F (36.9 C)  TempSrc:      SpO2: 100% 100% 93% 100%  Weight:      Height:        Intake/Output Summary (Last 24 hours) at 04/08/17 1016 Last data filed at 04/08/17 0900  Gross per 24 hour  Intake          3258.39 ml  Output              5495 ml  Net         -2236.61 ml   Filed Weights   04/06/17 0200 04/07/17 0401 04/08/17 0349  Weight: 231 lb 0.7 oz (104.8 kg) 227 lb 11.8 oz (103.3 kg) 228 lb 2.8 oz (103.5 kg)    Telemetry     NSR- Personally Reviewed  ECG    No new tracings  Physical Exam   GEN: No acute distress.   Neck: No JVD Cardiac: RRR, 2/6 systolic murmur RUSB Respiratory: Clear to auscultation bilaterally except for scattered rhonchi. Congested cough.  GI: Soft, nontender, non-distended  MS: Generalized edema, right lower ext >left; No deformity. Neuro:  Nonfocal  Psych: Normal affect   Labs    Chemistry Recent Labs Lab 04/04/17 1918  04/07/17 0356 04/07/17 1422 04/08/17 0358  NA 137  < > 142 143 144  K 3.8  < > 3.2* 3.3* 3.4*  CL 102  < > 107 109 109  CO2 25  < > _0 GLUCOSE 192*  < > 105* 118* 106*  BUN 9  < > _1 CREATININE 0.89  < > 0.88 0.92 0.92  CALCIUM 7.2*  < > 7.5* 7.7* 7.5*  PROT  5.6*  --   --   --   --   ALBUMIN 3.5  --   --   --   --   AST 66*  --   --   --   --   ALT 25  --   --   --   --   ALKPHOS 63  --   --   --   --   BILITOT 3.3*  --   --   --   --   GFRNONAA >60  < > >60 >60 >60  GFRAA >60  < > >60 >60 >60  ANIONGAP 10  < > _0 < > = values in this interval not displayed.   Hematology Recent Labs Lab 04/06/17 0346  04/07/17 0041 04/07/17 0356 04/08/17 0358  WBC 21.1*  --   --  17.4* 14.6*  RBC 3.84*  --   --  3.75* 3.88*  HGB 10.9*  < > 10.6* 10.7* 10.8*  HCT 34.7*  < > 34.1* 33.9* 35.2*  MCV 90.4  --   --  90.4 90.7  MCH 28.4  --   --  28.5 27.8  MCHC 31.4  --   --  31.6 30.7  RDW 17.6*  --   --  17.6* 17.7*  PLT 130*  --   --  131* 128*  < > = values in this interval not displayed.  Cardiac Enzymes Recent Labs Lab 04/04/17 1918 04/04/17 2348 04/05/17 0923  TROPONINI 3.85* 15.43* 33.25*   No results for input(s): TROPIPOC in the last 168 hours.   BNP Recent Labs Lab 04/04/17 1530  BNP 96.1     DDimer No results for  input(s): DDIMER in the last 168 hours.   Radiology    No results found.  Cardiac Studies   Echo 04/05/17 Study Conclusions  - Left ventricle: The cavity size was normal. Wall thickness was   normal. Systolic function was mildly reduced. The estimated   ejection fraction was in the range of 45% to 50%. Anterior,   apical and inferoapical hypokinesis with a hyperdynamic base. The   study is not technically sufficient to allow evaluation of LV   diastolic function due to E/A fusion. Ejection fraction (MOD,   2-plane): 46%. - Aortic valve: Poorly visualized. Elevated gradients suggest at   least moderate aortic stenosis. Valve area (VTI): 0.83 cm^2.   Valve area (Vmax): 0.76 cm^2. Valve area (Vmean): 0.75 cm^2. - Mitral valve: Calcified annulus. Mildly thickened leaflets . Mild   stenosis. There was trivial regurgitation. Valve area by pressure   half-time: 2.42 cm^2. Valve area by continuity equation (using   LVOT flow): 1.03 cm^2. - Left atrium: The atrium was mildly dilated. - Tricuspid valve: There was mild regurgitation. - Pulmonary arteries: PA peak pressure: 50 mm Hg (S). - Inferior vena cava: The vessel was dilated. The respirophasic   diameter changes were blunted (< 50%), consistent with elevated   central venous pressure.  Impressions:  - Compared to a prior study in 12/2015, the LVEF is reduced from   65-70% to 45-50% with anterior, apical and inferoapical   hypokinesis and a hyperdynamic base. Findings could suggest LAD   territory ischemia/infarct or Takatsubo cardiomyopathy. Elevated   transvalvular gradients suggestive of at least moderate aortic   valve stenosis.  Patient Profile     66 y.o. male with cirrhosis and gastric varices and recurrent GI bleeding, had circulatory collapse associated with ECG changes suggestive of  anterolateral ischemia, resolved after correction of hypotension and anemia using transfusion and pressors. Troponin increased to  33.  Assessment & Plan    CAD/NSTEMI -Troponin max 33. New anterior WMA with EF 45-50%. Not a cath candidate or anticoagulation candidate at this time.  -Continuing to treat anemia.  -No chest pain, stable on neo, hopefully able to wean soon.   Cirrhosis with severe portal hypertension and bleeding esophageal varices s/p TIPS and sclerotherapy -Hgb stable at 10.8 today  Acute systolic congestive heart failure -LVEF 45-50%, volume overloaded from fluid resuscitation and likely new anterior MI. -Lasix 40 mg IV BID -Continues on neo, off levophed, SBP in the 90's -Wt 204 on admission up to max of 231 on 5/5, 228 today.  -I&O +6.7L since admission, negative 5.1L in last 24h. -Now extubated without current dyspnea.   Signed, Daune Perch, NP  04/08/2017, 10:16 AM    Patient examined chart reviewed. Exam with right central catheter rhonchi post extubation with plus 2 LE edema and no murmur. SEMI not clear of mechanism takatsubo in setting of hemodynamically significant variceal bleed or fixed epicardial CAD. Continue diuresis not a candidate for intervention or DAT with recent bleed. Will consider risk stratification with cath or myovue latter in week  Jenkins Rouge

## 2017-04-09 ENCOUNTER — Inpatient Hospital Stay (HOSPITAL_COMMUNITY): Payer: Medicare Other

## 2017-04-09 ENCOUNTER — Encounter (HOSPITAL_COMMUNITY): Payer: Self-pay | Admitting: Radiology

## 2017-04-09 LAB — CBC WITH DIFFERENTIAL/PLATELET
Basophils Absolute: 0 10*3/uL (ref 0.0–0.1)
Basophils Relative: 0 %
EOS ABS: 0.7 10*3/uL (ref 0.0–0.7)
EOS PCT: 5 %
HEMATOCRIT: 34.8 % — AB (ref 39.0–52.0)
Hemoglobin: 10.8 g/dL — ABNORMAL LOW (ref 13.0–17.0)
LYMPHS ABS: 1.5 10*3/uL (ref 0.7–4.0)
Lymphocytes Relative: 10 %
MCH: 28.1 pg (ref 26.0–34.0)
MCHC: 31 g/dL (ref 30.0–36.0)
MCV: 90.4 fL (ref 78.0–100.0)
MONO ABS: 1.9 10*3/uL — AB (ref 0.1–1.0)
MONOS PCT: 13 %
Neutro Abs: 10.1 10*3/uL — ABNORMAL HIGH (ref 1.7–7.7)
Neutrophils Relative %: 72 %
Platelets: 131 10*3/uL — ABNORMAL LOW (ref 150–400)
RBC: 3.85 MIL/uL — AB (ref 4.22–5.81)
RDW: 17.3 % — AB (ref 11.5–15.5)
WBC: 14.2 10*3/uL — AB (ref 4.0–10.5)

## 2017-04-09 LAB — MAGNESIUM: Magnesium: 1.8 mg/dL (ref 1.7–2.4)

## 2017-04-09 LAB — RENAL FUNCTION PANEL
ALBUMIN: 2.3 g/dL — AB (ref 3.5–5.0)
Anion gap: 7 (ref 5–15)
BUN: 16 mg/dL (ref 6–20)
CALCIUM: 7.5 mg/dL — AB (ref 8.9–10.3)
CO2: 29 mmol/L (ref 22–32)
Chloride: 106 mmol/L (ref 101–111)
Creatinine, Ser: 0.96 mg/dL (ref 0.61–1.24)
GFR calc Af Amer: >60 mL/min (ref >60)
GFR calc non Af Amer: >60 mL/min (ref >60)
GLUCOSE: 107 mg/dL — AB (ref 65–99)
PHOSPHORUS: 2 mg/dL — AB (ref 2.5–4.6)
Potassium: 3.1 mmol/L — ABNORMAL LOW (ref 3.5–5.1)
SODIUM: 142 mmol/L (ref 135–145)

## 2017-04-09 LAB — POCT I-STAT 3, ART BLOOD GAS (G3+)
ACID-BASE EXCESS: 4 mmol/L — AB (ref 0.0–2.0)
Bicarbonate: 27.1 mmol/L (ref 20.0–28.0)
O2 SAT: 85 %
PH ART: 7.504 — AB (ref 7.350–7.450)
TCO2: 28 mmol/L (ref 0–100)
pCO2 arterial: 34.4 mmHg (ref 32.0–48.0)
pO2, Arterial: 45 mmHg — ABNORMAL LOW (ref 83.0–108.0)

## 2017-04-09 LAB — GLUCOSE, CAPILLARY
GLUCOSE-CAPILLARY: 171 mg/dL — AB (ref 65–99)
GLUCOSE-CAPILLARY: 179 mg/dL — AB (ref 65–99)
Glucose-Capillary: 99 mg/dL (ref 65–99)
Glucose-Capillary: 105 mg/dL — ABNORMAL HIGH (ref 65–99)

## 2017-04-09 LAB — CORTISOL: CORTISOL PLASMA: 22.1 ug/dL

## 2017-04-09 LAB — PROCALCITONIN: PROCALCITONIN: 0.74 ng/mL

## 2017-04-09 MED ORDER — RIFAXIMIN 550 MG PO TABS
550.0000 mg | ORAL_TABLET | Freq: Two times a day (BID) | ORAL | Status: DC
  Administered 2017-04-09 – 2017-04-13 (×9): 550 mg via ORAL
  Filled 2017-04-09 (×9): qty 1

## 2017-04-09 MED ORDER — VITAMIN B-1 100 MG PO TABS
100.0000 mg | ORAL_TABLET | Freq: Every day | ORAL | Status: DC
  Administered 2017-04-09 – 2017-04-13 (×5): 100 mg via ORAL
  Filled 2017-04-09 (×6): qty 1

## 2017-04-09 MED ORDER — POTASSIUM CHLORIDE 10 MEQ/50ML IV SOLN
10.0000 meq | INTRAVENOUS | Status: AC
  Administered 2017-04-09 (×4): 10 meq via INTRAVENOUS
  Filled 2017-04-09 (×4): qty 50

## 2017-04-09 MED ORDER — LIDOCAINE HCL (PF) 1 % IJ SOLN
INTRAMUSCULAR | Status: AC
  Administered 2017-04-09: 1 mL
  Filled 2017-04-09: qty 5

## 2017-04-09 MED ORDER — IPRATROPIUM-ALBUTEROL 0.5-2.5 (3) MG/3ML IN SOLN
3.0000 mL | Freq: Three times a day (TID) | RESPIRATORY_TRACT | Status: DC
  Administered 2017-04-09 – 2017-04-14 (×14): 3 mL via RESPIRATORY_TRACT
  Filled 2017-04-09 (×13): qty 3

## 2017-04-09 MED ORDER — IOPAMIDOL (ISOVUE-300) INJECTION 61%
INTRAVENOUS | Status: AC
  Administered 2017-04-09: 100 mL
  Filled 2017-04-09: qty 100

## 2017-04-09 MED ORDER — FOLIC ACID 1 MG PO TABS
1.0000 mg | ORAL_TABLET | Freq: Every day | ORAL | Status: DC
  Administered 2017-04-09 – 2017-04-13 (×5): 1 mg via ORAL
  Filled 2017-04-09 (×6): qty 1

## 2017-04-09 MED ORDER — FUROSEMIDE 10 MG/ML IJ SOLN
60.0000 mg | Freq: Three times a day (TID) | INTRAMUSCULAR | Status: DC
Start: 2017-04-09 — End: 2017-04-10
  Administered 2017-04-09 – 2017-04-10 (×2): 60 mg via INTRAVENOUS
  Filled 2017-04-09 (×3): qty 6

## 2017-04-09 MED ORDER — LACTULOSE 10 GM/15ML PO SOLN
10.0000 g | Freq: Two times a day (BID) | ORAL | Status: DC
  Administered 2017-04-09 – 2017-04-12 (×7): 10 g via ORAL
  Filled 2017-04-09 (×7): qty 15

## 2017-04-09 MED ORDER — FUROSEMIDE 40 MG PO TABS
40.0000 mg | ORAL_TABLET | Freq: Two times a day (BID) | ORAL | Status: DC
  Administered 2017-04-09 (×2): 40 mg via ORAL
  Filled 2017-04-09 (×2): qty 1

## 2017-04-09 NOTE — Progress Notes (Signed)
[facility] Progress Note  Subjective: Extubated and alert with some mild epigastric pain.  Objective: Vital signs in last 24 hours: Temp:  [98.2 F (36.8 C)-99.9 F (37.7 C)] 99.9 F (37.7 C) (05/08 1000) Pulse Rate:  [88-112] 108 (05/08 1000) Resp:  [13-30] 22 (05/08 1000) BP: (85-102)/(45-59) 89/46 (05/08 1000) SpO2:  [85 %-100 %] 100 % (05/08 1000) Arterial Line BP: (48-123)/(37-53) 113/38 (05/08 1000) Weight:  [103.5 kg (228 lb 2.8 oz)] 103.5 kg (228 lb 2.8 oz) (05/08 0356) Weight change: 0 kg (0 lb)   PE: Abdomen soft mildly distended no obvious tenderness  Lab Results: Results for orders placed or performed during the hospital encounter of 03/29/17 (from the past 24 hour(s))  Glucose, capillary     Status: Abnormal   Collection Time: 04/08/17 12:22 PM  Result Value Ref Range   Glucose-Capillary 103 (H) 65 - 99 mg/dL   Comment 1 Document in Chart   Glucose, capillary     Status: Abnormal   Collection Time: 04/08/17  3:30 PM  Result Value Ref Range   Glucose-Capillary 106 (H) 65 - 99 mg/dL   Comment 1 Capillary Specimen    Comment 2 Notify RN   Glucose, capillary     Status: None   Collection Time: 04/08/17  7:32 PM  Result Value Ref Range   Glucose-Capillary 96 65 - 99 mg/dL   Comment 1 Capillary Specimen   Glucose, capillary     Status: Abnormal   Collection Time: 04/08/17 11:26 PM  Result Value Ref Range   Glucose-Capillary 149 (H) 65 - 99 mg/dL   Comment 1 Capillary Specimen    Comment 2 Notify RN   Glucose, capillary     Status: None   Collection Time: 04/09/17  3:07 AM  Result Value Ref Range   Glucose-Capillary 99 65 - 99 mg/dL   Comment 1 Capillary Specimen    Comment 2 Notify RN   Magnesium     Status: None   Collection Time: 04/09/17  4:10 AM  Result Value Ref Range   Magnesium 1.8 1.7 - 2.4 mg/dL  CBC with Differential/Platelet     Status: Abnormal   Collection Time: 04/09/17  4:10 AM  Result Value Ref Range   WBC 14.2 (H) 4.0 -  10.5 K/uL   RBC 3.85 (L) 4.22 - 5.81 MIL/uL   Hemoglobin 10.8 (L) 13.0 - 17.0 g/dL   HCT 34.8 (L) 39.0 - 52.0 %   MCV 90.4 78.0 - 100.0 fL   MCH 28.1 26.0 - 34.0 pg   MCHC 31.0 30.0 - 36.0 g/dL   RDW 17.3 (H) 11.5 - 15.5 %   Platelets 131 (L) 150 - 400 K/uL   Neutrophils Relative % 72 %   Neutro Abs 10.1 (H) 1.7 - 7.7 K/uL   Lymphocytes Relative 10 %   Lymphs Abs 1.5 0.7 - 4.0 K/uL   Monocytes Relative 13 %   Monocytes Absolute 1.9 (H) 0.1 - 1.0 K/uL   Eosinophils Relative 5 %   Eosinophils Absolute 0.7 0.0 - 0.7 K/uL   Basophils Relative 0 %   Basophils Absolute 0.0 0.0 - 0.1 K/uL  Procalcitonin     Status: None   Collection Time: 04/09/17  4:10 AM  Result Value Ref Range   Procalcitonin 0.74 ng/mL  Renal function panel     Status: Abnormal   Collection Time: 04/09/17  4:10 AM  Result Value Ref Range   Sodium 142 135 - 145 mmol/L   Potassium  3.1 (L) 3.5 - 5.1 mmol/L   Chloride 106 101 - 111 mmol/L   CO2 29 22 - 32 mmol/L   Glucose, Bld 107 (H) 65 - 99 mg/dL   BUN 16 6 - 20 mg/dL   Creatinine, Ser 0.96 0.61 - 1.24 mg/dL   Calcium 7.5 (L) 8.9 - 10.3 mg/dL   Phosphorus 2.0 (L) 2.5 - 4.6 mg/dL   Albumin 2.3 (L) 3.5 - 5.0 g/dL   GFR calc non Af Amer >60 >60 mL/min   GFR calc Af Amer >60 >60 mL/min   Anion gap 7 5 - 15  Glucose, capillary     Status: Abnormal   Collection Time: 04/09/17  7:20 AM  Result Value Ref Range   Glucose-Capillary 105 (H) 65 - 99 mg/dL   Comment 1 Capillary Specimen   Cortisol     Status: None   Collection Time: 04/09/17  9:44 AM  Result Value Ref Range   Cortisol, Plasma 22.1 ug/dL    Studies/Results: No results found.    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    Codylee Patil C 04/09/2017, 11:24 AM  Pager [phone] If no answer or after 5 PM call [phone]

## 2017-04-09 NOTE — Progress Notes (Signed)
[facility] Progress Note Patient Name: John Warren DOB: Feb 17, 1951 MRN: [id number]   Date of Service  04/09/2017  HPI/Events of Note  pcxr edema Dc oral lasix Start IV Get lytes  eICU Interventions       Intervention Category Major Interventions: Respiratory failure - evaluation and management  Raylene Miyamoto. 04/09/2017, 6:55 PM

## 2017-04-09 NOTE — Progress Notes (Signed)
[facility] Attending Note  Presenting HPI:  66 y.o. male with alcoholic cirrhosis, portal gastropathy, and prior GI bleeds. Patient has had 3 admissions since January 2017 with GI bleeding. Last admitted 03/30/17 with recurrent GI bleed starting the day prior. Endorse bright red blood per rectum after a dark, tarry bowel movement. No known NSAID use or anticoagulants. Reportedly quit using alcohol 1 year ago. Patient was admitted and given packed red blood cells and FFP. Initially started on a PPI and octreotide. Difficult to localize bleeding source on CT imaging. Patient subsequently underwent BRTO on 5/4. Following colonoscopy on 4/30 patient had worsening hypotension. Patient was transfused 3 units packed red blood cells, given IV albumin, and started on phenylephrine infusion. Central line was placed as well as arterial line. PCCM was consulted for further management. Patient's blood pressure seemed to respond well to albumin, Nori Riis, and transfusion. He was transferred to the ICU but further declined after transfer requiring endotracheal intubation for respiratory insufficiency.  Subjective:  No acute events overnight. Extubated yesterday. Patient denies any nausea. Reports mild epigastric abdominal discomfort. No dyspnea or cough.  Review of Systems:  No fever or chills. No chest pain or pressure.  Temp:  [98.1 F (36.7 C)-99.5 F (37.5 C)] 99.3 F (37.4 C) (05/08 0700) Pulse Rate:  [88-112] 92 (05/08 0700) Resp:  [8-30] 16 (05/08 0700) BP: (85-102)/(45-59) 101/52 (05/08 0700) SpO2:  [85 %-100 %] 93 % (05/08 0700) Arterial Line BP: (48-124)/(37-53) 112/46 (05/08 0700) Weight:  [103.5 kg (228 lb 2.8 oz)] 103.5 kg (228 lb 2.8 oz) (05/08 0356)  Gen.: Girlfriend again at bedside. No acute distress. Awake. Alert. Neurological: Cranial nerves grossly intact. Alert and oriented 4. No meningismus. Moving all 4 extremities equally. Cardiovascular: Regular rate. No JVD  appreciated. Pitting edema bilateral lower studies. Pulmonary: Mildly decreased breath sounds bilateral lung bases. Normal work of breathing on nasal cannula oxygen. Abdomen: Soft. Protuberant. Nontender. HEENT: Moist is membranes. No scleral injection.  LINES/TUBES: OETT 5/3 - 5/7 R IJ CVL 5/3 >>> Art Line 5/3 >>> Foley >>> Rectal Tube >>> PIV  CBC Latest Ref Rng & Units 04/09/2017 04/08/2017 04/07/2017  WBC 4.0 - 10.5 K/uL 14.2(H) 14.6(H) 17.4(H)  Hemoglobin 13.0 - 17.0 g/dL 10.8(L) 10.8(L) 10.7(L)  Hematocrit 39.0 - 52.0 % 34.8(L) 35.2(L) 33.9(L)  Platelets 150 - 400 K/uL 131(L) 128(L) 131(L)   BMP Latest Ref Rng & Units 04/09/2017 04/08/2017 04/07/2017  Glucose 65 - 99 mg/dL 107(H) 106(H) 118(H)  BUN 6 - 20 mg/dL _0 Creatinine 0.61 - 1.24 mg/dL 0.96 0.92 0.92  Sodium 135 - 145 mmol/L 142 144 143  Potassium 3.5 - 5.1 mmol/L 3.1(L) 3.4(L) 3.3(L)  Chloride 101 - 111 mmol/L 106 109 109  CO2 22 - 32 mmol/L _1 Calcium 8.9 - 10.3 mg/dL 7.5(L) 7.5(L) 7.7(L)    Hepatic Function Latest Ref Rng & Units 04/09/2017 04/04/2017 04/01/2017  Total Protein 6.5 - 8.1 g/dL - 5.6(L) 4.2(L)  Albumin 3.5 - 5.0 g/dL 2.3(L) 3.5 2.3(L)  AST 15 - 41 U/L - 66(H) 26  ALT 17 - 63 U/L - 25 18  Alk Phosphatase 38 - 126 U/L - 63 52  Total Bilirubin 0.3 - 1.2 mg/dL - 3.3(H) 0.7  Bilirubin, Direct 0.1 - 0.5 mg/dL - 1.2(H) -    IMAGING/STUDIES: TTE 5/4: LV normal in size with EF 45-50% & normal wall thickness. Hypokinesis of anterior, apical, and inferoapical walls. Insufficient study to assess diastolic function. LA  mildly dilated. Main pulmonary artery normal in size. Aortic valve poorly visualized but gradients suggestive of at least moderate aortic stenosis. Mild mitral stenosis. Trivial pulmonic regurgitation. Mild tricuspid regurgitation. No pericardial effusion. PORT CXR 5/4:  Previously reviewed by me. Film rotated left. Patchy bilateral lower lobe opacities as well as reticulation indicating edema.  Endotracheal tube and right internal jugular central venous catheter in good position.  MICROBIOLOGY: MRSA PCR 4/27:  Negative Blood Cultures x2 4/28:  Negative  Tracheal Aspirate Culture 5/4:  Rare C albicans  ANTIBIOTICS: Rocephin 4/28 - 5/2 Vancomycin 5/4 - 5/8 Cefepime 5/4 >>> Flagyl 5/4 >>>  SIGNIFICANT EVENTS: 04/27 - Admit 04/28 - EGD demonstrated type I isolated gastric varices without bleeding and a single nonbleeding angiodysplastic lesion within the stomach. Normal duodenum. 04/30 - Colonoscopy demonstrated a large amount of fresh and old blood along with clots in the entire colon along with diverticulosis in the sigmoid and descending colon without active bleeding. Nonbleeding internal hemorrhoids. 05/02 - Capsule Endoscopy demonstrated small nonbleeding AVM in the mid small bowel with one nonspecific erosion in the small bowel. No evidence of blood in the small bowel or colon though poor visualization. 05/03 - S/P TIPS & BATO (Balloon-occluded Antegrade Transvenous Obliteration of Gastric Varices) by IR  ASSESSMENT/PLAN:  66 y.o. male with resolving acute hypoxic respiratory failure postextubation. Shock persists and is of unclear etiology. Certainly could be due to his acute myocardial infarction but also could be secondary to sepsis. Patient has no further signs of active bleeding postprocedure and neurological status seems to be intact. He is tolerating a full liquid diet.   1. Acute hypoxic respiratory failure: Improving. Continuing incentive spirometry for pulmonary toilette & diuresis with Lasix. Weaning FiO2 for saturation 94%. 2. Shock: Unclear etiology and ongoing. Differential includes myocardial infarction and sepsis. Continuing to wean vasopressor infusion. Checking serum cortisol. 3. Acute blood loss anemia: Secondary to variceal bleed. Hemoglobin stable.Further signs of active bleeding. Trending cell counts daily with CBC. 4. Possible sepsis: Possible  intra-abdominal source with TIPS and BATO. Discontinuing vancomycin. Continuing cefepime and Flagyl while trending Procalcitonin and leukocytosis. 5. Non-STEMI: Multifactorial from patient's shock as well as acute blood loss anemia. Cardiology consulted. Appreciate recommendations. Unable to anticoagulate or start antiplatelet therapy due to acute blood loss. Unable to start beta blocker due to hypotension. 6. COPD: No signs of acute exacerbation. Continuing Duonebs 3 times a day & Pulmicort nebulized twice a day. 7. 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 Continuing patient on rifaximin twice a day & switching lactulose to by mouth twice a day. 8. Acute systolic and chronically 2 diastolic congestive heart failure: Switching Lasix to 40 mg by mouth twice a day. Unable to start beta blocker or ACE inhibitor in the setting of hypotension. 9. Acute encephalopathy: Multifactorial. Resolved. Continuing oral thiamine and folic acid. Continuing lactulose by mouth twice a day. 10. Hypokalemia: KCl 10 mEq IV 4 runs. Repeat electrolyte panel in the morning.   Prophylaxis:  SCDs & Protonix IV q12hr.  Diet:  Continuing full liquid diet.  Code Status:  Full Code per previous physician discussions. Disposition:  Remains critically ill with continued titration of vasopressor infusion. Family Update: Girlfriend and patient updated during rounds today.  I have spent a total of 33 minutes of critical care time today caring for the patient, updating him during rounds, and reviewing the patient's electronic medical record.   Donna Christen Jamison Neighbor, M.D. [facility] Pager:  [phone] After 3pm or if no response,  call [phone] 8:32 AM 04/09/17

## 2017-04-09 NOTE — Progress Notes (Signed)
Progress Note  Patient Name: John Warren Date of Encounter: 04/09/2017  Primary Cardiologist: Dr. Wynonia Lawman  Subjective   No chest pain taking soft solids   Inpatient Medications    Scheduled Meds: . budesonide (PULMICORT) nebulizer solution  0.5 mg Nebulization BID  . Chlorhexidine Gluconate Cloth  6 each Topical Daily  . folic acid  1 mg Oral Daily  . furosemide  40 mg Oral BID  . ipratropium-albuterol  3 mL Nebulization TID  . lactulose  10 g Oral BID  . mouth rinse  15 mL Mouth Rinse BID  . pantoprazole (PROTONIX) IV  40 mg Intravenous Q12H  . rifaximin  550 mg Oral BID  . sodium chloride flush  10-40 mL Intracatheter Q12H  . thiamine  100 mg Oral Daily   Continuous Infusions: . sodium chloride 10 mL/hr at 04/08/17 0600  . sodium chloride 10 mL/hr at 04/08/17 0600  . ceFEPime (MAXIPIME) IV Stopped (04/09/17 9485)  . metronidazole 500 mg (04/09/17 4627)  . phenylephrine 80mg /550mL (0.16mg /mL) infusion 130 mcg/min (04/09/17 1024)  . potassium chloride 10 mEq (04/09/17 1029)   PRN Meds: acetaminophen **OR** acetaminophen, diphenhydrAMINE, sodium chloride flush   Vital Signs    Vitals:   04/09/17 0700 04/09/17 0907 04/09/17 0910 04/09/17 1000  BP: (!) 101/52   (!) 89/46  Pulse: 92   (!) 108  Resp: 16   (!) 22  Temp: 99.3 F (37.4 C)   99.9 F (37.7 C)  TempSrc:      SpO2: 93% 92% 100% 100%  Weight:      Height:        Intake/Output Summary (Last 24 hours) at 04/09/17 1048 Last data filed at 04/09/17 1000  Gross per 24 hour  Intake           2851.8 ml  Output             3875 ml  Net          -1023.2 ml   Filed Weights   04/07/17 0401 04/08/17 0349 04/09/17 0356  Weight: 227 lb 11.8 oz (103.3 kg) 228 lb 2.8 oz (103.5 kg) 228 lb 2.8 oz (103.5 kg)    Telemetry     NSR- Personally Reviewed  ECG    No new tracings  Physical Exam   Affect appropriate Chronically ill white male  HEENT: normal Neck supple with no adenopathy JVP normal no bruits  no thyromegaly Right IJ  Lungs clear with no wheezing and good diaphragmatic motion Heart:  S1/S2 no murmur, no rub, gallop or click PMI normal Abdomen: benighn, BS positve, no tenderness, no AAA no bruit.  No HSM or HJR Distal pulses intact with no bruits Plus 2 bilateral edema Neuro non-focal Skin warm and dry No muscular weakness   Labs    Chemistry Recent Labs Lab 04/04/17 1918  04/07/17 1422 04/08/17 0358 04/09/17 0410  NA 137  < > 143 144 142  K 3.8  < > 3.3* 3.4* 3.1*  CL 102  < > 109 109 106  CO2 25  < > 26 27 29   GLUCOSE 192*  < > 118* 106* 107*  BUN 9  < > 14 16 16   CREATININE 0.89  < > 0.92 0.92 0.96  CALCIUM 7.2*  < > 7.7* 7.5* 7.5*  PROT 5.6*  --   --   --   --   ALBUMIN 3.5  --   --   --  2.3*  AST 66*  --   --   --   --  ALT 25  --   --   --   --   ALKPHOS 63  --   --   --   --   BILITOT 3.3*  --   --   --   --   GFRNONAA >60  < > >60 >60 >60  GFRAA >60  < > >60 >60 >60  ANIONGAP 10  < > 8 8 7   < > = values in this interval not displayed.   Hematology  Recent Labs Lab 04/07/17 0356 04/08/17 0358 04/09/17 0410  WBC 17.4* 14.6* 14.2*  RBC 3.75* 3.88* 3.85*  HGB 10.7* 10.8* 10.8*  HCT 33.9* 35.2* 34.8*  MCV 90.4 90.7 90.4  MCH 28.5 27.8 28.1  MCHC 31.6 30.7 31.0  RDW 17.6* 17.7* 17.3*  PLT 131* 128* 131*    Cardiac Enzymes  Recent Labs Lab 04/04/17 1918 04/04/17 2348 04/05/17 0923  TROPONINI 3.85* 15.43* 33.25*   No results for input(s): TROPIPOC in the last 168 hours.   BNP  Recent Labs Lab 04/04/17 1530  BNP 96.1     DDimer No results for input(s): DDIMER in the last 168 hours.   Radiology    No results found.  Cardiac Studies   Echo 04/05/17 Study Conclusions  - Left ventricle: The cavity size was normal. Wall thickness was   normal. Systolic function was mildly reduced. The estimated   ejection fraction was in the range of 45% to 50%. Anterior,   apical and inferoapical hypokinesis with a hyperdynamic base.  The   study is not technically sufficient to allow evaluation of LV   diastolic function due to E/A fusion. Ejection fraction (MOD,   2-plane): 46%. - Aortic valve: Poorly visualized. Elevated gradients suggest at   least moderate aortic stenosis. Valve area (VTI): 0.83 cm^2.   Valve area (Vmax): 0.76 cm^2. Valve area (Vmean): 0.75 cm^2. - Mitral valve: Calcified annulus. Mildly thickened leaflets . Mild   stenosis. There was trivial regurgitation. Valve area by pressure   half-time: 2.42 cm^2. Valve area by continuity equation (using   LVOT flow): 1.03 cm^2. - Left atrium: The atrium was mildly dilated. - Tricuspid valve: There was mild regurgitation. - Pulmonary arteries: PA peak pressure: 50 mm Hg (S). - Inferior vena cava: The vessel was dilated. The respirophasic   diameter changes were blunted (< 50%), consistent with elevated   central venous pressure.  Impressions:  - Compared to a prior study in 12/2015, the LVEF is reduced from   65-70% to 45-50% with anterior, apical and inferoapical   hypokinesis and a hyperdynamic base. Findings could suggest LAD   territory ischemia/infarct or Takatsubo cardiomyopathy. Elevated   transvalvular gradients suggestive of at least moderate aortic   valve stenosis.  Patient Profile     66 y.o. male with cirrhosis and gastric varices and recurrent GI bleeding, had circulatory collapse associated with ECG changes suggestive of anterolateral ischemia, resolved after correction of hypotension and anemia using transfusion and pressors. Troponin increased to 33.  Assessment & Plan    CAD/NSTEMI -Troponin max 33. New anterior WMA with EF 45-50%. Not a cath candidate or anticoagulation candidate at this time.  -Continuing to treat anemia.  -No chest pain, stable on neo, hopefully able to wean soon.  -Will consider risk stratification with cath or Myoview later in the week. - unable to use beta blocker due to hypotension   Cirrhosis with  severe portal hypertension and bleeding esophageal varices s/p TIPS and sclerotherapy -Hgb stable at  10.8 today  Acute systolic congestive heart failure -LVEF 45-50%, volume overloaded from fluid resuscitation and likely new anterior MI. -Lasix 40 mg IV TID changed to oral dosing with 40 mg bid per PCCM this am -Continues on neo, off levophed, SBP in the 90's -Wt 204 on admission up to max of 231 on 5/5, 228 today (228 yesterday).  -I&O +6.3L since admission, negative 424 mL in last 24h. -Now extubated without current dyspnea.  -Continue diuresis will likely use aldactone once stable given ascites and cirrhosis   Jenkins Rouge

## 2017-04-09 NOTE — Progress Notes (Signed)
Rt note- Patient with sp02 86% on 5l/min [place] post getting back from CT, called to assess. Increased to 6l/min [place] and ABG obtained, Dr. Titus Mould called at e-link, patient then increased fio2 to NRB. Xray pending.

## 2017-04-09 NOTE — Anesthesia Postprocedure Evaluation (Addendum)
Anesthesia Post Note  Patient: John Warren  Procedure(s) Performed: Procedure(s) (LRB): RADIOLOGY WITH ANESTHESIA (N/A)  Patient location during evaluation: SICU Anesthesia Type: General Level of consciousness: sedated Pain management: pain level controlled Vital Signs Assessment: post-procedure vital signs reviewed and stable Respiratory status: patient remains intubated per anesthesia plan Cardiovascular status: stable Anesthetic complications: no       Last Vitals:  Vitals:   04/09/17 0600 04/09/17 0700  BP: (!) 101/52 (!) 101/52  Pulse: 88 92  Resp: 15 16  Temp: 37.3 C 37.4 C    Last Pain:  Vitals:   04/09/17 0400  TempSrc:   PainSc: Asleep                 Dinesh Ulysse

## 2017-04-09 NOTE — Progress Notes (Signed)
[facility] Progress Note Patient Name: John Warren DOB: 29-Nov-1951 MRN: [id number]   Date of Service  04/09/2017  HPI/Events of Note  Post CT hypoxia No distress Ph WNL WOB wnl O2 increased responded  pcxr stat  eICU Interventions       Intervention Category Intermediate Interventions: Respiratory distress - evaluation and management  Raylene Miyamoto. 04/09/2017, 6:38 PM

## 2017-04-09 NOTE — Progress Notes (Signed)
[facility] ADULT ICU REPLACEMENT PROTOCOL FOR AM LAB REPLACEMENT ONLY  The patient does apply for the [facility] Adult ICU Electrolyte Replacment Protocol based on the criteria listed below:   1. Is GFR >/= 40 ml/min? Yes.    Patient's GFR today is >60 2. Is urine output >/= 0.5 ml/kg/hr for the last 6 hours? Yes.   Patient's UOP is .84 ml/kg/hr 3. Is BUN < 60 mg/dL? Yes.    Patient's BUN today is 16 4. Abnormal electrolyte(s): K-3.1 5. Ordered repletion with: per protocol 6. If a panic level lab has been reported, has the CCM MD in charge been notified? No..   Physician:  Terrill Mohr, Philis Nettle 04/09/2017 5:21 AM

## 2017-04-10 DIAGNOSIS — K7030 Alcoholic cirrhosis of liver without ascites: Secondary | ICD-10-CM

## 2017-04-10 DIAGNOSIS — R579 Shock, unspecified: Secondary | ICD-10-CM | POA: Diagnosis not present

## 2017-04-10 LAB — CBC WITH DIFFERENTIAL/PLATELET
BASOS ABS: 0 10*3/uL (ref 0.0–0.1)
BASOS PCT: 0 %
Eosinophils Absolute: 0.4 10*3/uL (ref 0.0–0.7)
Eosinophils Relative: 3 %
HEMATOCRIT: 37.3 % — AB (ref 39.0–52.0)
HEMOGLOBIN: 11.3 g/dL — AB (ref 13.0–17.0)
LYMPHS PCT: 11 %
Lymphs Abs: 1.6 10*3/uL (ref 0.7–4.0)
MCH: 27.6 pg (ref 26.0–34.0)
MCHC: 30.3 g/dL (ref 30.0–36.0)
MCV: 91 fL (ref 78.0–100.0)
Monocytes Absolute: 2.4 10*3/uL — ABNORMAL HIGH (ref 0.1–1.0)
Monocytes Relative: 16 %
NEUTROS ABS: 10.2 10*3/uL — AB (ref 1.7–7.7)
NEUTROS PCT: 70 %
Platelets: 129 10*3/uL — ABNORMAL LOW (ref 150–400)
RBC: 4.1 MIL/uL — AB (ref 4.22–5.81)
RDW: 17.4 % — ABNORMAL HIGH (ref 11.5–15.5)
WBC: 14.7 10*3/uL — AB (ref 4.0–10.5)

## 2017-04-10 LAB — RENAL FUNCTION PANEL
ALBUMIN: 2.2 g/dL — AB (ref 3.5–5.0)
Anion gap: 10 (ref 5–15)
BUN: 13 mg/dL (ref 6–20)
CHLORIDE: 99 mmol/L — AB (ref 101–111)
CO2: 32 mmol/L (ref 22–32)
Calcium: 7.3 mg/dL — ABNORMAL LOW (ref 8.9–10.3)
Creatinine, Ser: 1.02 mg/dL (ref 0.61–1.24)
Glucose, Bld: 107 mg/dL — ABNORMAL HIGH (ref 65–99)
POTASSIUM: 2.9 mmol/L — AB (ref 3.5–5.1)
Phosphorus: 2 mg/dL — ABNORMAL LOW (ref 2.5–4.6)
Sodium: 141 mmol/L (ref 135–145)

## 2017-04-10 LAB — BASIC METABOLIC PANEL
ANION GAP: 10 (ref 5–15)
BUN: 13 mg/dL (ref 6–20)
CALCIUM: 7.3 mg/dL — AB (ref 8.9–10.3)
CO2: 32 mmol/L (ref 22–32)
CREATININE: 1.02 mg/dL (ref 0.61–1.24)
Chloride: 99 mmol/L — ABNORMAL LOW (ref 101–111)
GFR calc Af Amer: >60 mL/min (ref >60)
GLUCOSE: 107 mg/dL — AB (ref 65–99)
Potassium: 2.9 mmol/L — ABNORMAL LOW (ref 3.5–5.1)
Sodium: 141 mmol/L (ref 135–145)

## 2017-04-10 LAB — GLUCOSE, CAPILLARY
Glucose-Capillary: 105 mg/dL — ABNORMAL HIGH (ref 65–99)
Glucose-Capillary: 108 mg/dL — ABNORMAL HIGH (ref 65–99)
Glucose-Capillary: 143 mg/dL — ABNORMAL HIGH (ref 65–99)

## 2017-04-10 LAB — MAGNESIUM: MAGNESIUM: 1.7 mg/dL (ref 1.7–2.4)

## 2017-04-10 LAB — PROCALCITONIN: PROCALCITONIN: 0.73 ng/mL

## 2017-04-10 LAB — TROPONIN I: TROPONIN I: 2.02 ng/mL — AB (ref <0.03)

## 2017-04-10 MED ORDER — ENSURE ENLIVE PO LIQD
237.0000 mL | Freq: Two times a day (BID) | ORAL | Status: DC
  Administered 2017-04-10 – 2017-04-12 (×4): 237 mL via ORAL
  Filled 2017-04-10 (×2): qty 237

## 2017-04-10 MED ORDER — HYDROCORTISONE NA SUCCINATE PF 100 MG IJ SOLR
100.0000 mg | Freq: Once | INTRAMUSCULAR | Status: AC
  Administered 2017-04-10: 100 mg via INTRAVENOUS
  Filled 2017-04-10: qty 2

## 2017-04-10 MED ORDER — POTASSIUM CHLORIDE 10 MEQ/50ML IV SOLN
10.0000 meq | INTRAVENOUS | Status: AC
  Administered 2017-04-10 (×8): 10 meq via INTRAVENOUS
  Filled 2017-04-10 (×8): qty 50

## 2017-04-10 MED ORDER — POTASSIUM CHLORIDE CRYS ER 20 MEQ PO TBCR
20.0000 meq | EXTENDED_RELEASE_TABLET | Freq: Once | ORAL | Status: AC
  Administered 2017-04-10: 20 meq via ORAL
  Filled 2017-04-10: qty 1

## 2017-04-10 MED ORDER — ASPIRIN EC 81 MG PO TBEC
81.0000 mg | DELAYED_RELEASE_TABLET | Freq: Every day | ORAL | Status: DC
  Administered 2017-04-11 – 2017-04-17 (×7): 81 mg via ORAL
  Filled 2017-04-10 (×8): qty 1

## 2017-04-10 MED ORDER — FUROSEMIDE 40 MG PO TABS
40.0000 mg | ORAL_TABLET | Freq: Two times a day (BID) | ORAL | Status: DC
  Administered 2017-04-10 – 2017-04-13 (×7): 40 mg via ORAL
  Filled 2017-04-10 (×9): qty 1

## 2017-04-10 MED ORDER — PANTOPRAZOLE SODIUM 40 MG PO TBEC
40.0000 mg | DELAYED_RELEASE_TABLET | Freq: Two times a day (BID) | ORAL | Status: DC
  Administered 2017-04-10 – 2017-04-17 (×14): 40 mg via ORAL
  Filled 2017-04-10 (×14): qty 1

## 2017-04-10 MED ORDER — SPIRONOLACTONE 25 MG PO TABS
25.0000 mg | ORAL_TABLET | Freq: Every day | ORAL | Status: DC
  Administered 2017-04-10 – 2017-04-17 (×8): 25 mg via ORAL
  Filled 2017-04-10 (×8): qty 1

## 2017-04-10 NOTE — Progress Notes (Signed)
Dr Johnsie Cancel paged with troponin results 2.20 and also that patient refuses aspirin due to his bleeding issues

## 2017-04-10 NOTE — Progress Notes (Signed)
[facility] ADULT ICU REPLACEMENT PROTOCOL FOR AM LAB REPLACEMENT ONLY  The patient does apply for the [facility] Adult ICU Electrolyte Replacment Protocol based on the criteria listed below:   1. Is GFR >/= 40 ml/min? Yes.    Patient's GFR today is >60 2. Is urine output >/= 0.5 ml/kg/hr for the last 6 hours? Yes.   Patient's UOP is 4.49 ml/kg/hr 3. Is BUN < 60 mg/dL? Yes.    Patient's BUN today is 13 4. Abnormal electrolyte(s): Potassium 2.9 5. Ordered repletion with: Potassium per protocol 6. If a panic level lab has been reported, has the CCM MD in charge been notified? Yes.  .   Physician: Ferdinand Cava P 04/10/2017 4:43 AM

## 2017-04-10 NOTE — Progress Notes (Signed)
Progress Note  Patient Name: John Warren Date of Encounter: 04/10/2017  Primary Cardiologist: Dr. Wynonia Lawman  Subjective   No chest pain advancing diet   Inpatient Medications    Scheduled Meds: . budesonide (PULMICORT) nebulizer solution  0.5 mg Nebulization BID  . Chlorhexidine Gluconate Cloth  6 each Topical Daily  . folic acid  1 mg Oral Daily  . furosemide  40 mg Oral BID  . hydrocortisone sod succinate (SOLU-CORTEF) inj  100 mg Intravenous Once  . ipratropium-albuterol  3 mL Nebulization TID  . lactulose  10 g Oral BID  . mouth rinse  15 mL Mouth Rinse BID  . pantoprazole  40 mg Oral BID  . rifaximin  550 mg Oral BID  . sodium chloride flush  10-40 mL Intracatheter Q12H  . thiamine  100 mg Oral Daily   Continuous Infusions: . sodium chloride 10 mL/hr at 04/08/17 0600  . ceFEPime (MAXIPIME) IV Stopped (04/10/17 0650)  . metronidazole 500 mg (04/10/17 0936)  . phenylephrine 80mg /560mL (0.16mg /mL) infusion 100 mcg/min (04/10/17 0900)  . potassium chloride 10 mEq (04/10/17 0936)   PRN Meds: acetaminophen **OR** acetaminophen, diphenhydrAMINE, sodium chloride flush   Vital Signs    Vitals:   04/10/17 0900 04/10/17 0915 04/10/17 0930 04/10/17 0945  BP: (!) 93/51  (!) 85/45   Pulse: (!) 103 99 (!) 103 (!) 101  Resp: (!) 21 17 19 18   Temp:      TempSrc:      SpO2: (!) 89% 90% (!) 89% 94%  Weight:      Height:        Intake/Output Summary (Last 24 hours) at 04/10/17 1018 Last data filed at 04/10/17 0936  Gross per 24 hour  Intake          2035.54 ml  Output             8525 ml  Net         -6489.46 ml   Filed Weights   04/08/17 0349 04/09/17 0356 04/10/17 0426  Weight: 228 lb 2.8 oz (103.5 kg) 228 lb 2.8 oz (103.5 kg) 228 lb 2.8 oz (103.5 kg)    Telemetry     NSR- Personally Reviewed  ECG    ST rate 121 no acute St changes 04/05/17  Physical Exam   Affect appropriate Chronically ill white male  HEENT: normal Neck supple with no adenopathy right  IJ  JVP normal no bruits no thyromegaly Lungs clear with no wheezing and good diaphragmatic motion Heart:  S1/S2 no murmur, no rub, gallop or click PMI normal Abdomen: ascites with scrotal edema  no bruit.  No HSM or HJR Distal pulses intact with no bruits Plus 2 LE  edema Neuro non-focal Skin warm and dry No muscular weakness    Labs    Chemistry Recent Labs Lab 04/04/17 1918  04/08/17 0358 04/09/17 0410 04/10/17 0400  NA 137  < > 144 142 141  141  K 3.8  < > 3.4* 3.1* 2.9*  2.9*  CL 102  < > 109 106 99*  99*  CO2 25  < > 27 29 32  32  GLUCOSE 192*  < > 106* 107* 107*  107*  BUN 9  < > 16 16 13  13   CREATININE 0.89  < > 0.92 0.96 1.02  1.02  CALCIUM 7.2*  < > 7.5* 7.5* 7.3*  7.3*  PROT 5.6*  --   --   --   --  ALBUMIN 3.5  --   --  2.3* 2.2*  AST 66*  --   --   --   --   ALT 25  --   --   --   --   ALKPHOS 63  --   --   --   --   BILITOT 3.3*  --   --   --   --   GFRNONAA >60  < > >60 >60 >60  >60  GFRAA >60  < > >60 >60 >60  >60  ANIONGAP 10  < > 8 7 10  10   < > = values in this interval not displayed.   Hematology  Recent Labs Lab 04/08/17 0358 04/09/17 0410 04/10/17 0400  WBC 14.6* 14.2* 14.7*  RBC 3.88* 3.85* 4.10*  HGB 10.8* 10.8* 11.3*  HCT 35.2* 34.8* 37.3*  MCV 90.7 90.4 91.0  MCH 27.8 28.1 27.6  MCHC 30.7 31.0 30.3  RDW 17.7* 17.3* 17.4*  PLT 128* 131* 129*    Cardiac Enzymes  Recent Labs Lab 04/04/17 1918 04/04/17 2348 04/05/17 0923  TROPONINI 3.85* 15.43* 33.25*   No results for input(s): TROPIPOC in the last 168 hours.   BNP  Recent Labs Lab 04/04/17 1530  BNP 96.1     DDimer No results for input(s): DDIMER in the last 168 hours.   Radiology    Ct Abdomen Pelvis W Wo Contrast  Result Date: 04/10/2017 CLINICAL DATA:  66 year old male with a history of gastric varices and portal hypertension, status post balloon occluded trans venous obliteration of varices with TIPS. EXAM: CT ABDOMEN AND PELVIS WITHOUT AND  WITH CONTRAST TECHNIQUE: Multidetector CT imaging of the abdomen and pelvis was performed following the standard protocol before and following the bolus administration of intravenous contrast. CONTRAST:  152mL ISOVUE-300 IOPAMIDOL (ISOVUE-300) INJECTION 61% COMPARISON:  03/30/2017 FINDINGS: Lower chest: Pleural effusions bilateral lung bases. Associated atelectasis/ consolidation. Calcifications of the aortic valve. Native coronary calcifications. Bilateral gynecomastia Hepatobiliary: Interval changes of TIPS shunt which appears patent on the CT study. Similar configuration of the liver with nodular contour and decreasing volume with preferential enlargement of caudate. Differential enhancement of the posterior segment of the right liver, likely flow related given the new shunt. No evidence of pericholecystic fluid or inflammatory changes. High density material within the gallbladder either vicarious excretion of contrast or potentially biliary sludge/ stones. Pancreas: Unremarkable appearance of pancreas. Spleen: Unremarkable spleen. Adrenals/Urinary Tract: Unremarkable adrenal glands. No evidence of left-sided or right-sided hydronephrosis. No perinephric stranding on the left with the right. No nephrolithiasis. There is a focal region of differential enhancement on the lateral cortex of the right kidney which is new from the comparison. Unremarkable appearance of urinary bladder with catheter in position. Stomach/Bowel: Post treatment effects of embolization of the gastric varices with streak artifact at the level of the stomach extending into the esophageal varices system. No abnormally distended small bowel.  No transition point. Rectus seal. 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 No colonic wall thickening. Normal appendix. Lymphatic: Low-density free fluid within the pelvis. Mesenteric infiltration at the base of the mesenteric with trace free fluid on the liver margin. Trace  free fluid in the left upper quadrant adjacent to the spleen. No significant adenopathy. Body wall anasarca/ edema including evidence of scrotal swelling on the inferior images. Vasculature: Vascular calcifications of the abdominal aorta including mesenteric vessels and bilateral renal arteries. Proximal femoral vasculature is patent. Interval treatment changes of embolization of the portal to systemic  shunt system with streak artifact in the gastric an esophageal varices. No evidence of persisting opacification of varices in the stomach. No evidence of nontarget embolization. Reproductive: Unremarkable appearance of the pelvic organs. Other: Abdominal wall anasarca/edema Musculoskeletal: No displaced fracture. Multilevel degenerative changes of the lumbar spine. Vacuum disc phenomenon at L4-L5. IMPRESSION: Interval treatment changes of TIPS and embolization of gastric and esophageal varices. No evidence of persisting varices at the stomach or distal esophagus. Body wall and mesenteric anasarca/edema, with bilateral pleural effusions and small volume ascites in the pelvis. There is also evidence of scrotal edema, incompletely imaged. Focal differential enhancement of the lateral cortex of the right kidney, potentially flow related, or alternatively could represent a new renal infarction or renal infection. If there is concern for urinary tract infection, correlation with urinalysis may be useful. Cirrhotic liver changes. Differential geographic attenuation/enhancement of the posterior lobes of the right liver is favored to be a flow phenomenon given the new shunt. Aortic atherosclerosis and coronary artery disease. Electronically Signed   By: Corrie Mckusick D.O.   On: 04/10/2017 09:07   Dg Chest Port 1 View  Result Date: 04/09/2017 CLINICAL DATA:  Hypoxia. EXAM: PORTABLE CHEST 1 VIEW COMPARISON:  Earlier this day at 1836 hours FINDINGS: 1941 hour: Right internal jugular central venous catheter has been retracted,  the tip projects over the region of the distal brachiocephalic vein. A persistent loop/ kink is seen proximally in the right supraclavicular region. Diffuse bilateral interstitial opacities, right greater than left, with minimal improvement from recent prior. There are bilateral pleural effusions. Stable heart size and mediastinal contours. No pneumothorax. IMPRESSION: 1. Right internal jugular central venous catheter is been retracted, tip in the region the brachiocephalic vein. There is a persistent loop/kink in the supraclavicular region. 2. Persistent bilateral interstitial opacities likely pulmonary edema, mild improvement from recent prior may be technique related. Pleural effusions are again seen. Electronically Signed   By: Jeb Levering M.D.   On: 04/09/2017 20:07   Dg Chest Port 1 View  Result Date: 04/09/2017 CLINICAL DATA:  Central line placement EXAM: PORTABLE CHEST 1 VIEW COMPARISON:  Chest radiograph 04/05/2017 FINDINGS: There is a right IJ central venous catheter with tip in the lower SVC. A portion of the proximal catheter is kinked, overlying the medial first rib. It is unclear whether this is internal or external. Diffuse bilateral opacities persist, suggesting severe pulmonary edema. Unchanged cardiomegaly and calcific aortic atherosclerosis. Small bilateral pleural effusions. IMPRESSION: 1. Right IJ central venous catheter tip in the lower SVC. Recommend visual inspection to determine whether the kinked portion is subcutaneous or external. 2. Diffuse pulmonary edema and small bilateral pleural effusions, consistent with CHF. Electronically Signed   By: Ulyses Jarred M.D.   On: 04/09/2017 18:55    Cardiac Studies   Echo 04/05/17 Study Conclusions  - Left ventricle: The cavity size was normal. Wall thickness was   normal. Systolic function was mildly reduced. The estimated   ejection fraction was in the range of 45% to 50%. Anterior,   apical and inferoapical hypokinesis with a  hyperdynamic base. The   study is not technically sufficient to allow evaluation of LV   diastolic function due to E/A fusion. Ejection fraction (MOD,   2-plane): 46%. - Aortic valve: Poorly visualized. Elevated gradients suggest at   least moderate aortic stenosis. Valve area (VTI): 0.83 cm^2.   Valve area (Vmax): 0.76 cm^2. Valve area (Vmean): 0.75 cm^2. - Mitral valve: Calcified annulus. Mildly thickened leaflets . Mild  stenosis. There was trivial regurgitation. Valve area by pressure   half-time: 2.42 cm^2. Valve area by continuity equation (using   LVOT flow): 1.03 cm^2. - Left atrium: The atrium was mildly dilated. - Tricuspid valve: There was mild regurgitation. - Pulmonary arteries: PA peak pressure: 50 mm Hg (S). - Inferior vena cava: The vessel was dilated. The respirophasic   diameter changes were blunted (< 50%), consistent with elevated   central venous pressure.  Impressions:  - Compared to a prior study in 12/2015, the LVEF is reduced from   65-70% to 45-50% with anterior, apical and inferoapical   hypokinesis and a hyperdynamic base. Findings could suggest LAD   territory ischemia/infarct or Takatsubo cardiomyopathy. Elevated   transvalvular gradients suggestive of at least moderate aortic   valve stenosis.  Patient Profile     66 y.o. male with cirrhosis and gastric varices and recurrent GI bleeding, had circulatory collapse associated with ECG changes suggestive of anterolateral ischemia, resolved after correction of hypotension and anemia using transfusion and pressors. Troponin increased to 33.  Assessment & Plan    CAD/NSTEMI -Troponin max 33. New anterior WMA with EF 45-50%. Not a cath candidate or anticoagulation candidate at this time. Will repeat to make sure it is going down. With EF 45-50% low BP should not be from cardiogenic shock. Will try to start low dose aspirin given SEMI   Cirrhosis with severe portal hypertension and bleeding esophageal  varices s/p TIPS and sclerotherapy -Hgb stable at 12.1 today CT abdomen with good resolution of varices Still with volume overload  And ascites add aldactone and supplement K  Shock:  Wean pressors as tolerated limits Rx CAD likely some 3rd spacing fluid doubt cardiogenic etiology  Risk stratification for CAD will have to wait until off pressors and clinically more stable   Jenkins Rouge

## 2017-04-10 NOTE — Progress Notes (Signed)
[facility] Note  Presenting HPI:  66 y.o. male with alcoholic cirrhosis, portal gastropathy, and prior GI bleeds. Patient has had 3 admissions since January 2017 with GI bleeding. Last admitted 03/30/17 with recurrent GI bleed starting the day prior. Endorse bright red blood per rectum after a dark, tarry bowel movement. No known NSAID use or anticoagulants. Reportedly quit using alcohol 1 year ago. Patient was admitted and given packed red blood cells and FFP. Initially started on a PPI and octreotide. Difficult to localize bleeding source on CT imaging. Patient subsequently underwent BRTO on 5/4. Following colonoscopy on 4/30 patient had worsening hypotension. Patient was transfused 3 units packed red blood cells, given IV albumin, and started on phenylephrine infusion. Central line was placed as well as arterial line. PCCM was consulted for further management. Patient's blood pressure seemed to respond well to albumin, neo, and transfusion. He was transferred to the ICU but further declined after transfer requiring endotracheal intubation for respiratory insufficiency.  Subjective:  Pt reports feeling better.  No acute events overnight.  Remains on 100 mcg neo.  Pt reports he went to CT yesterday and central line was inadvertently pulled out and repositioned (??no notes in chart)   Temp:  [98.2 F (36.8 C)-99.9 F (37.7 C)] 98.2 F (36.8 C) (05/09 0736) Pulse Rate:  [85-178] 93 (05/09 0830) Resp:  [14-46] 14 (05/09 0830) BP: (80-102)/(43-71) 94/52 (05/09 0830) SpO2:  [49 %-100 %] 90 % (05/09 0830) Arterial Line BP: (76-138)/(34-70) 93/42 (05/09 0830) Weight:  [228 lb 2.8 oz (103.5 kg)] 228 lb 2.8 oz (103.5 kg) (05/09 0426)  General:  Chronically ill appearing male in NAD, eating breakfast / wife at bedside HEENT: MM pink/moist PSY: calm/appropriate Neuro: AAOx4, speech clear, MAE CV: s1s2 rrr, SEM noted 2-3/6 PULM: even/non-labored, lungs bilaterally  WP:YKDX,  non-tender, bsx4 active  Extremities: warm/dry, no edema  Skin: no rashes or lesions   LINES/TUBES: OETT 5/3 - 5/7 R IJ CVL 5/3 >> Art Line 5/3 >> Foley >> Rectal Tube >> PIV  CBC Latest Ref Rng & Units 04/10/2017 04/09/2017 04/08/2017  WBC 4.0 - 10.5 K/uL 14.7(H) 14.2(H) 14.6(H)  Hemoglobin 13.0 - 17.0 g/dL 11.3(L) 10.8(L) 10.8(L)  Hematocrit 39.0 - 52.0 % 37.3(L) 34.8(L) 35.2(L)  Platelets 150 - 400 K/uL 129(L) 131(L) 128(L)   BMP Latest Ref Rng & Units 04/10/2017 04/10/2017 04/09/2017  Glucose 65 - 99 mg/dL 107(H) 107(H) 107(H)  BUN 6 - 20 mg/dL _0 Creatinine 0.61 - 1.24 mg/dL 1.02 1.02 0.96  Sodium 135 - 145 mmol/L 141 141 142  Potassium 3.5 - 5.1 mmol/L 2.9(L) 2.9(L) 3.1(L)  Chloride 101 - 111 mmol/L 99(L) 99(L) 106  CO2 22 - 32 mmol/L 32 32 29  Calcium 8.9 - 10.3 mg/dL 7.3(L) 7.3(L) 7.5(L)    Hepatic Function Latest Ref Rng & Units 04/10/2017 04/09/2017 04/04/2017  Total Protein 6.5 - 8.1 g/dL - - 5.6(L)  Albumin 3.5 - 5.0 g/dL 2.2(L) 2.3(L) 3.5  AST 15 - 41 U/L - - 66(H)  ALT 17 - 63 U/L - - 25  Alk Phosphatase 38 - 126 U/L - - 63  Total Bilirubin 0.3 - 1.2 mg/dL - - 3.3(H)  Bilirubin, Direct 0.1 - 0.5 mg/dL - - 1.2(H)    IMAGING/STUDIES: TTE 5/4: LV normal in size with EF 45-50% & normal wall thickness. Hypokinesis of anterior, apical, and inferoapical walls. Insufficient study to assess diastolic function. LA mildly dilated. Main pulmonary artery normal in size. Aortic valve  poorly visualized but gradients suggestive of at least moderate aortic stenosis. Mild mitral stenosis. Trivial pulmonic regurgitation. Mild tricuspid regurgitation. No pericardial effusion. PORT CXR 5/4:  Previously reviewed by me. Film rotated left. Patchy bilateral lower lobe opacities as well as reticulation indicating edema. Endotracheal tube and right internal jugular central venous catheter in good position. 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 Normal duodenum. 04/30 - Colonoscopy demonstrated a large amount of fresh and old blood along with clots in the entire colon along with diverticulosis in the sigmoid and descending colon without active bleeding. Nonbleeding internal hemorrhoids. 05/02 - Capsule Endoscopy demonstrated small nonbleeding AVM in the mid small bowel with one nonspecific erosion in the small bowel. No evidence of blood in the small bowel or colon though poor visualization. 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 male with resolving acute hypoxic respiratory failure postextubation. Shock persists and is of unclear etiology. Certainly could be due to his acute myocardial infarction but also could be secondary to sepsis. Patient has no further signs of active bleeding postprocedure and neurological status seems to be intact. He is tolerating a full liquid diet.   Acute hypoxic respiratory failure - improved, suspect in setting of critical illness, atelectasis  Plan: Wean O2 for sats > 94% Push IS, mobilize  Intermittent CXR    Shock - unclear etiology, DDx includes MI, sepsis, physiologic changes post TIPS  Plan: Wean neosynephrine to off for MAP >60   Solucortef 100 mg x1 Continue Aline for now  Consider line removal as soon as possible    Acute blood loss anemia - in setting of variceal bleed, Hgb stable  Plan: Follow CBC  Monitor closely for bleeding    Possible sepsis - possible intra-abdominal source with TIPS & BATO.  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  Plan: Continue thiamine, folate Lactulose BID    Hypokalemia Plan: Replace K as appropriate  Follow up labs in am   Prophylaxis:  SCDs & Protonix PO q12hr.  Diet:  Full liquid diet as tolerated   Code Status:  Full Code if short term reversible process.  Would not want long term support if no chance of recovery Disposition:  ICU / vasopressor support. Family Update: Patient and girlfriend updated at bedside. Have asked social work to assist with living will completion.   CC Time: 13 minutes   Noe Gens, NP-C [facility] Pgr: [phone] or if no answer [phone] 04/10/2017, 9:01 AM

## 2017-04-10 NOTE — Progress Notes (Signed)
Nutrition Follow-up  DOCUMENTATION CODES:   Not applicable  INTERVENTION:   Ensure Enlive po BID, each supplement provides 350 kcal and 20 grams of protein  NUTRITION DIAGNOSIS:   Inadequate oral intake related to acute illness as evidenced by other (see comment) (full liquid diet).  Ongoing  GOAL:   Patient will meet greater than or equal to 90% of their needs  Progressing  MONITOR:   Diet advancement, PO intake, Supplement acceptance  ASSESSMENT:    66 y.o. male with PMH including but not limited to EtOH cirrhosis, portal gastropathy, and prior GI bleeds (3 admissions since Jan 2017, previously due to AVM and sigmoid diverticulosis). He was admitted 03/30/17 with recurrent GI bleed that started the day prior. S/P colonoscopy on 4/30. Developed respiratory failure and required intubation on 5/3.  Patient was extubated on 5/7. Diet has been advanced to full liquids, patient is tolerating well. He does not like milk or the tomato soup. Agreed to try chocolate Ensure to maximize intake of protein and calories. Labs reviewed: potassium 2.9 (L) Medications reviewed and include folic acid, lasix, lactulose, thiamine.  Diet Order:  Diet full liquid Room service appropriate? Yes; Fluid consistency: Thin  Skin:  Reviewed, no issues  Last BM:  5/9  Height:   Ht Readings from Last 1 Encounters:  04/04/17 5\' 11"  (1.803 m)    Weight:   Wt Readings from Last 1 Encounters:  04/10/17 228 lb 2.8 oz (103.5 kg)    Ideal Body Weight:  78.2 kg  BMI:  Body mass index is 31.82 kg/m.  Estimated Nutritional Needs:   Kcal:  2000-2200  Protein:  100-120 gm  Fluid:  2-2.2 L  EDUCATION NEEDS:   No education needs identified at this time  Molli Barrows, [place], [place], [place] Pager [phone] After Hours Pager [phone]

## 2017-04-10 NOTE — Care Management Note (Addendum)
Case Management Note  Patient Details  Name: QURON RUDDY MRN: [id number] Date of Birth: March 09, 1951  Subjective/Objective:    Pt admitted with GI bleed                Action/Plan:  PTA from home .  CM will continue to follow for discharge needs   Expected Discharge Date:                  Expected Discharge Plan:  Home/Self Care  In-House Referral:     Discharge planning Services  CM Consult  Post Acute Care Choice:    Choice offered to:     DME Arranged:    DME Agency:     HH Arranged:    HH Agency:     Status of Service:  In process, will continue to follow  If discussed at Long Length of Stay Meetings, dates discussed:    Additional Comments: 04/10/2017 Pt extubated 04/08/17.  PT eval ordered Maryclare Labrador, RN 04/10/2017, 2:57 PM

## 2017-04-11 ENCOUNTER — Inpatient Hospital Stay (HOSPITAL_COMMUNITY): Payer: Medicare Other

## 2017-04-11 DIAGNOSIS — J9690 Respiratory failure, unspecified, unspecified whether with hypoxia or hypercapnia: Secondary | ICD-10-CM | POA: Diagnosis not present

## 2017-04-11 LAB — RENAL FUNCTION PANEL
ALBUMIN: 2 g/dL — AB (ref 3.5–5.0)
ANION GAP: 8 (ref 5–15)
BUN: 14 mg/dL (ref 6–20)
CALCIUM: 7.5 mg/dL — AB (ref 8.9–10.3)
CO2: 28 mmol/L (ref 22–32)
CREATININE: 0.89 mg/dL (ref 0.61–1.24)
Chloride: 101 mmol/L (ref 101–111)
GFR calc non Af Amer: >60 mL/min (ref >60)
Glucose, Bld: 141 mg/dL — ABNORMAL HIGH (ref 65–99)
PHOSPHORUS: 1.8 mg/dL — AB (ref 2.5–4.6)
Potassium: 3.2 mmol/L — ABNORMAL LOW (ref 3.5–5.1)
SODIUM: 137 mmol/L (ref 135–145)

## 2017-04-11 LAB — CBC WITH DIFFERENTIAL/PLATELET
BASOS ABS: 0 10*3/uL (ref 0.0–0.1)
BASOS PCT: 0 %
EOS ABS: 0 10*3/uL (ref 0.0–0.7)
Eosinophils Relative: 0 %
HCT: 35.5 % — ABNORMAL LOW (ref 39.0–52.0)
HEMOGLOBIN: 11.1 g/dL — AB (ref 13.0–17.0)
LYMPHS PCT: 7 %
Lymphs Abs: 1.4 10*3/uL (ref 0.7–4.0)
MCH: 28 pg (ref 26.0–34.0)
MCHC: 31.3 g/dL (ref 30.0–36.0)
MCV: 89.4 fL (ref 78.0–100.0)
MONO ABS: 2.6 10*3/uL — AB (ref 0.1–1.0)
Monocytes Relative: 13 %
NEUTROS PCT: 80 %
Neutro Abs: 15.7 10*3/uL — ABNORMAL HIGH (ref 1.7–7.7)
PLATELETS: 149 10*3/uL — AB (ref 150–400)
RBC: 3.97 MIL/uL — ABNORMAL LOW (ref 4.22–5.81)
RDW: 17.3 % — ABNORMAL HIGH (ref 11.5–15.5)
WBC: 19.7 10*3/uL — AB (ref 4.0–10.5)

## 2017-04-11 LAB — MAGNESIUM: Magnesium: 1.8 mg/dL (ref 1.7–2.4)

## 2017-04-11 MED ORDER — MIDODRINE HCL 5 MG PO TABS
2.5000 mg | ORAL_TABLET | Freq: Three times a day (TID) | ORAL | Status: DC
  Administered 2017-04-11 – 2017-04-15 (×12): 2.5 mg via ORAL
  Filled 2017-04-11 (×13): qty 1

## 2017-04-11 MED ORDER — POTASSIUM CHLORIDE CRYS ER 20 MEQ PO TBCR
20.0000 meq | EXTENDED_RELEASE_TABLET | ORAL | Status: AC
  Administered 2017-04-11 (×2): 20 meq via ORAL
  Filled 2017-04-11 (×2): qty 1

## 2017-04-11 NOTE — Evaluation (Signed)
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 PMHx: cirrhosis, GIB, CHF  Clinical Impression  Pt pleasant and eager to get on his feet. Pt without dizziness or drop in BP with mobility and able to ambulate short distance today but did not want to attempt greater distance at this time due to fear of fatigue. On arrival pt on 6L [place] with sats 84%, transitioned pt to NRB 15L with RN present and sats 95% pt ambulated on 15L and turned down to 12L end of session with sats 94%. Pt with decreased functional mobility, transfers, gait and cardiopulmonary status who will benefit from acute therapy to maximize mobility, gait and independence to decrease burden of care and return pt to independent level.  BP 107/75 (86) in sitting HR 101-109    Follow Up Recommendations Home health PT;Supervision/Assistance - 24 hour    Equipment Recommendations  Rolling walker with 5" wheels;3in1 (PT)    Recommendations for Other Services OT consult     Precautions / Restrictions Precautions Precautions: Fall Precaution Comments: watch sats Restrictions Weight Bearing Restrictions: No      Mobility  Bed Mobility Overal bed mobility: Needs Assistance Bed Mobility: Supine to Sit     Supine to sit: Supervision;HOB elevated     General bed mobility comments: increased time with use of rail and HOB 25degrees  Transfers Overall transfer level: Needs assistance   Transfers: Sit to/from Stand;Stand Pivot Transfers Sit to Stand: Min assist Stand pivot transfers: Min assist       General transfer comment: cues for hand placement and safety with assist to rise, pivot chair to [facility]  Ambulation/Gait Ambulation/Gait assistance: Min  assist Ambulation Distance (Feet): 40 Feet Assistive device: Rolling walker (2 wheeled) Gait Pattern/deviations: Step-through pattern;Trunk flexed;Decreased stride length   Gait velocity interpretation: Below normal speed for age/gender General Gait Details: cues for looking up, position in RW, posture, breathing technique  Stairs            Wheelchair Mobility    Modified Rankin (Stroke Patients Only)       Balance Overall balance assessment: Needs assistance   Sitting balance-Leahy Scale: Good       Standing balance-Leahy Scale: Fair                               Pertinent Vitals/Pain Pain Assessment: 0-10 Pain Score: 4  Pain Location: abdomen Pain Descriptors / Indicators: Sore Pain Intervention(s): Limited activity within patient's tolerance;Repositioned;Monitored during session    Home Living Family/patient expects to be discharged to:: Private residence Living Arrangements: Spouse/significant other Available Help at Discharge: Family;Available 24 hours/day Type of Home: Mobile home Home Access: Stairs to enter Entrance Stairs-Rails: None Entrance Stairs-Number of Steps: 3 Home Layout: One level Home Equipment: None      Prior Function Level of Independence: Independent               Hand Dominance        Extremity/Trunk Assessment   Upper Extremity Assessment Upper Extremity Assessment: Overall WFL for tasks assessed    Lower Extremity Assessment Lower Extremity Assessment: Overall WFL for tasks assessed    Cervical / Trunk Assessment Cervical / Trunk Assessment: Kyphotic  Communication  Communication: No difficulties  Cognition Arousal/Alertness: Awake/alert Behavior During Therapy: WFL for tasks assessed/performed Overall Cognitive Status: Within Functional Limits for tasks assessed                                        General Comments      Exercises     Assessment/Plan    PT Assessment  Patient needs continued PT services  PT Problem List Decreased mobility;Decreased activity tolerance;Decreased knowledge of use of DME;Cardiopulmonary status limiting activity       PT Treatment Interventions DME instruction;Therapeutic activities;Gait training;Therapeutic exercise;Stair training;Balance training;Functional mobility training;Patient/family education    PT Goals (Current goals can be found in the Care Plan section)  Acute Rehab PT Goals Patient Stated Goal: return home and play with 3yo grandson PT Goal Formulation: With patient/family Time For Goal Achievement: 04/25/17 Potential to Achieve Goals: Good    Frequency Min 3X/week   Barriers to discharge Inaccessible home environment family reports ramp can be installed if needed    Co-evaluation               AM-PAC PT "6 Clicks" Daily Activity  Outcome Measure Difficulty turning over in bed (including adjusting bedclothes, sheets and blankets)?: Total Difficulty moving from lying on back to sitting on the side of the bed? : Total Difficulty sitting down on and standing up from a chair with arms (e.g., wheelchair, bedside commode, etc,.)?: A Lot Help needed moving to and from a bed to chair (including a wheelchair)?: A Little Help needed walking in hospital room?: A Little Help needed climbing 3-5 steps with a railing? : A Lot 6 Click Score: 12    End of Session Equipment Utilized During Treatment: Gait belt;Oxygen Activity Tolerance: Patient tolerated treatment well Patient left: with call bell/phone within reach;with family/visitor present;Other (comment) (On BSC with RN aware) Nurse Communication: Mobility status PT Visit Diagnosis: Difficulty in walking, not elsewhere classified (R26.2)    Time: 7564-3329 PT Time Calculation (min) (ACUTE ONLY): 26 min   Charges:   PT Evaluation $PT Eval Moderate Complexity: 1 Procedure PT Treatments $Gait Training: 8-22 mins   PT G Codes:        Elwyn Reach, PT [phone]   [place] 04/11/2017, 10:03 AM

## 2017-04-11 NOTE — Progress Notes (Signed)
   Facilitated completion of Advanced Directive.  Documentation notarized.   Original w/ patient.   Copy added to chart.   Will follow, as needed.   - Rev. Maili MDiv ThM

## 2017-04-11 NOTE — Progress Notes (Signed)
[facility] Attending Note  Presenting HPI:  66 y.o. male with alcoholic cirrhosis, portal gastropathy, and prior GI bleeds. Patient has had 3 admissions since January 2017 with GI bleeding. Last admitted 03/30/17 with recurrent GI bleed starting the day prior. Endorse bright red blood per rectum after a dark, tarry bowel movement. No known NSAID use or anticoagulants. Reportedly quit using alcohol 1 year ago. Patient was admitted and given packed red blood cells and FFP. Initially started on a PPI and octreotide. Difficult to localize bleeding source on CT imaging. Patient subsequently underwent BRTO on 5/4. Following colonoscopy on 4/30 patient had worsening hypotension. Patient was transfused 3 units packed red blood cells, given IV albumin, and started on phenylephrine infusion. Central line was placed as well as arterial line. PCCM was consulted for further management. Patient's blood pressure seemed to respond well to albumin, neo, and transfusion. He was transferred to the ICU but further declined after transfer requiring endotracheal intubation for respiratory insufficiency.  Subjective:  No acute events overnight. Remains on vasopressor but decreasing. Patient denies any chest pain or pressure. Oxygen requirement increased to eye flow nasal cannula. Patient up to chair this morning and did ambulate a very short distance in the ICU.  Review of Systems:  Mild epigastric abdominal fullness with oral intake. Denies any frank abdominal pain. Has had bowel movements overnight. No hematochezia or melena. No subjective fever or chills.  Temp:  [97.9 F (36.6 C)-99.2 F (37.3 C)] 98 F (36.7 C) (05/10 0717) Pulse Rate:  [73-108] 100 (05/10 1000) Resp:  [13-32] 27 (05/10 1000) BP: (77-109)/(43-75) 93/50 (05/10 1000) SpO2:  [87 %-98 %] 98 % (05/10 1000) Arterial Line BP: (60-126)/(33-84) 103/45 (05/10 0800) FiO2 (%):  [6 %] 6 % (05/09 1630) Weight:  [236 lb 5.3 oz (107.2 kg)] 236  lb 5.3 oz (107.2 kg) (05/10 0717)  Gen.: No distress. Comfortable. Sitting up in chair. Integument: No rash on exposed skin. Warm. Dry. Neurological: Alert and oriented 4. No meningismus. Grossly nonfocal. Cardiovascular: Regular rate. Regular rhythm. Continue to edema. Unable to appreciate JVD. Pulmonary: Diminished breath sounds bilateral lung bases. Able to pull 1500 mL on incentive spirometer. Normal work of breathing on high flow nasal cannula. Abdomen: Soft. Protuberant. Normal bowel sounds.  LINES/TUBES: OETT 5/3 - 5/7 R IJ CVL 5/3 >> Art Line 5/3 >> Foley >> PIV  CBC Latest Ref Rng & Units 04/11/2017 04/10/2017 04/09/2017  WBC 4.0 - 10.5 K/uL 19.7(H) 14.7(H) 14.2(H)  Hemoglobin 13.0 - 17.0 g/dL 11.1(L) 11.3(L) 10.8(L)  Hematocrit 39.0 - 52.0 % 35.5(L) 37.3(L) 34.8(L)  Platelets 150 - 400 K/uL 149(L) 129(L) 131(L)    BMP Latest Ref Rng & Units 04/11/2017 04/10/2017 04/10/2017  Glucose 65 - 99 mg/dL 141(H) 107(H) 107(H)  BUN 6 - 20 mg/dL '14 13 13  ' Creatinine 0.61 - 1.24 mg/dL 0.89 1.02 1.02  Sodium 135 - 145 mmol/L 137 141 141  Potassium 3.5 - 5.1 mmol/L 3.2(L) 2.9(L) 2.9(L)  Chloride 101 - 111 mmol/L 101 99(L) 99(L)  CO2 22 - 32 mmol/L 28 32 32  Calcium 8.9 - 10.3 mg/dL 7.5(L) 7.3(L) 7.3(L)   Hepatic Function Latest Ref Rng & Units 04/11/2017 04/10/2017 04/09/2017  Total Protein 6.5 - 8.1 g/dL - - -  Albumin 3.5 - 5.0 g/dL 2.0(L) 2.2(L) 2.3(L)  AST 15 - 41 U/L - - -  ALT 17 - 63 U/L - - -  Alk Phosphatase 38 - 126 U/L - - -  Total Bilirubin 0.3 -  1.2 mg/dL - - -  Bilirubin, Direct 0.1 - 0.5 mg/dL - - -    IMAGING/STUDIES: TTE 5/4: LV normal in size with EF 45-50% & normal wall thickness. Hypokinesis of anterior, apical, and inferoapical walls. Insufficient study to assess diastolic function. LA mildly dilated. Main pulmonary artery normal in size. Aortic valve poorly visualized but gradients suggestive of at least moderate aortic stenosis. Mild mitral stenosis. Trivial pulmonic  regurgitation. Mild tricuspid regurgitation. No pericardial effusion. PORT CXR 5/4: Previously reviewed by me. Film rotated left. Patchy bilateral lower lobe opacities as well as reticulation indicating edema. Endotracheal tube and right internal jugular central venous catheter in good position. CT ABD/Pelvis 5/8: Interval treatment changes of TIPS and embolization of gastric and esophageal varices, no evidence of persisting varices at the stomach or distal esophagus, body wall and mesenteric anasarca/edema, with bilateral pleural effusions and small volume ascites in the pelvis, scrotal edema, cirrhotic liver changes PORT CXR 5/10:  Personally reviewed by me. Bilateral lower lobe opacities slightly worsening with silhouetting of right hemidiaphragm. Alveolar component and mid lung zones as well. Right internal jugular central venous catheter in good position.  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 Normal duodenum. 04/30 - Colonoscopy demonstrated a large amount of fresh and old blood along with clots in the entire colon along with diverticulosis in the sigmoid and descending colon without active bleeding. Nonbleeding internal hemorrhoids. 05/02 - Capsule Endoscopy demonstrated small nonbleeding AVM in the mid small bowel with one nonspecific erosion in the small bowel. No evidence of blood in the small bowel or colon though poor visualization. 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 male with steadily improving shock. Neurological status remains stable. Rising leukocytosis could be secondary to atelectasis given findings on chest x-ray noted above.  1. Shock: Continuing to wean vasopressor support. Starting Midodrin 2.5 mg by mouth 3 times a day with meals. Leaving arterial line in place today. 2. Acute hypoxic respiratory failure: Slight worsening today. Continuing gentle diuresis with Lasix by mouth twice a day & Aldactone. Continuing pulmonary toilette. 3. Acute blood loss anemia: Secondary to variceal bleed. No further bleeding. Hemoglobin stable. Trending cell count with CBC. 4. Non-STEMI: Multifactorial from shock and anemia. Appreciate cardiology recommendations. Starting aspirin enteric-coated 81 mg by mouth daily. 5. Possible sepsis: Patient completing 7 day course of cefepime and Flagyl today. Plan to reculture for fever. 6. COPD: No signs of acute exacerbation. Continuing Pulmicort nebulized twice daily & Duonebs 3 times a day. 7. Liver cirrhosis: Secondary alcohol. Continuing lactulose and rifaximin twice a day. 8. Acute systolic and chronic grade 2 diastolic congestive heart failure: Continuing Lasix 40 mg by mouth twice a day & Aldactone. Unable to initiate beta blocker or ACE inhibitor due to hypotension. 9. Acute encephalopathy: Multifactorial. Resolved. Continuing oral thiamine and folic acid as well as lactulose. Also continuing rifaximin. 10. Hypokalemia: Replacing with oral potassium chloride 40 mEq. Trending electrolytes daily.  Prophylaxis: SCDs &Protonix PO BID.  Diet: Advancing to Heart  Healthy Diet as tolerated.  Code Status: Full Code per previous physician discussions. Disposition: Remains critically ill with continued titration of vasopressor infusion. Family Update:  Patient updated during rounds today.   I have personally spent a total of 32 minutes of critical care time today caring for the patient, updating  patient at bedside, & reviewing the patient's electronic medical record.  Sonia Baller Ashok Cordia, M.D. [facility] Pager:  [phone] After 3pm or if no response, call [phone] 11:08 AM 04/11/17

## 2017-04-11 NOTE — Progress Notes (Signed)
Progress Note  Patient Name: John Warren Date of Encounter: 04/11/2017  Primary Cardiologist: Dr. Wynonia Lawman  Subjective   Pt seen up walking with PT, first time up in 10 days. Using oxygen NRB. No chest pain or increased shortness of breath.   Inpatient Medications    Scheduled Meds: . aspirin EC  81 mg Oral Daily  . budesonide (PULMICORT) nebulizer solution  0.5 mg Nebulization BID  . Chlorhexidine Gluconate Cloth  6 each Topical Daily  . feeding supplement (ENSURE ENLIVE)  237 mL Oral BID BM  . folic acid  1 mg Oral Daily  . furosemide  40 mg Oral BID  . ipratropium-albuterol  3 mL Nebulization TID  . lactulose  10 g Oral BID  . mouth rinse  15 mL Mouth Rinse BID  . pantoprazole  40 mg Oral BID  . rifaximin  550 mg Oral BID  . sodium chloride flush  10-40 mL Intracatheter Q12H  . spironolactone  25 mg Oral Daily  . thiamine  100 mg Oral Daily   Continuous Infusions: . sodium chloride 10 mL/hr at 04/08/17 0600  . ceFEPime (MAXIPIME) IV Stopped (04/11/17 0919)  . metronidazole 500 mg (04/11/17 1002)  . phenylephrine 80mg /519mL (0.16mg /mL) infusion 90 mcg/min (04/11/17 0812)   PRN Meds: acetaminophen **OR** acetaminophen, diphenhydrAMINE, sodium chloride flush   Vital Signs    Vitals:   04/11/17 0809 04/11/17 0900 04/11/17 0915 04/11/17 1000  BP:  109/75  (!) 93/50  Pulse:  95  100  Resp:  (!) 27  (!) 27  Temp:      TempSrc:      SpO2: 93% 95% 93% 98%  Weight:      Height:        Intake/Output Summary (Last 24 hours) at 04/11/17 1052 Last data filed at 04/11/17 0600  Gross per 24 hour  Intake             1253 ml  Output             1425 ml  Net             -172 ml   Filed Weights   04/09/17 0356 04/10/17 0426 04/11/17 0717  Weight: 228 lb 2.8 oz (103.5 kg) 228 lb 2.8 oz (103.5 kg) 236 lb 5.3 oz (107.2 kg)    Telemetry     NSR 80's- Personally Reviewed  ECG    ST rate 121 no acute St changes 04/05/17  Physical Exam   Affect  appropriate Chronically ill white male  HEENT: normal except for scleral icterus   Neck supple with no adenopathy right IJ  JVP normal no bruits no thyromegaly Lungs clear with End expiratory wheezes Heart:  S1/S2 no murmur, no rub, gallop or click PMI normal Abdomen: ascites with scrotal edema  no bruit.  No HSM or HJR Distal pulses intact with no bruits Plus 2 LE  Edema, L>R Neuro non-focal Skin warm and dry Generalized weakness    Labs    Chemistry Recent Labs Lab 04/04/17 1918  04/09/17 0410 04/10/17 0400 04/11/17 0445  NA 137  < > 142 141  141 137  K 3.8  < > 3.1* 2.9*  2.9* 3.2*  CL 102  < > 106 99*  99* 101  CO2 25  < > 29 32  32 28  GLUCOSE 192*  < > 107* 107*  107* 141*  BUN 9  < > 16 13  13 14   CREATININE 0.89  < >  0.96 1.02  1.02 0.89  CALCIUM 7.2*  < > 7.5* 7.3*  7.3* 7.5*  PROT 5.6*  --   --   --   --   ALBUMIN 3.5  --  2.3* 2.2* 2.0*  AST 66*  --   --   --   --   ALT 25  --   --   --   --   ALKPHOS 63  --   --   --   --   BILITOT 3.3*  --   --   --   --   GFRNONAA >60  < > >60 >60  >60 >60  GFRAA >60  < > >60 >60  >60 >60  ANIONGAP 10  < > 7 10  10 8   < > = values in this interval not displayed.   Hematology  Recent Labs Lab 04/09/17 0410 04/10/17 0400 04/11/17 0455  WBC 14.2* 14.7* 19.7*  RBC 3.85* 4.10* 3.97*  HGB 10.8* 11.3* 11.1*  HCT 34.8* 37.3* 35.5*  MCV 90.4 91.0 89.4  MCH 28.1 27.6 28.0  MCHC 31.0 30.3 31.3  RDW 17.3* 17.4* 17.3*  PLT 131* 129* 149*    Cardiac Enzymes  Recent Labs Lab 04/04/17 1918 04/04/17 2348 04/05/17 0923 04/10/17 1126  TROPONINI 3.85* 15.43* 33.25* 2.02*   No results for input(s): TROPIPOC in the last 168 hours.   BNP  Recent Labs Lab 04/04/17 1530  BNP 96.1     DDimer No results for input(s): DDIMER in the last 168 hours.   Radiology    Ct Abdomen Pelvis W Wo Contrast  Result Date: 04/10/2017 CLINICAL DATA:  66 year old male with a history of gastric varices and portal  hypertension, status post balloon occluded trans venous obliteration of varices with TIPS. EXAM: CT ABDOMEN AND PELVIS WITHOUT AND WITH CONTRAST TECHNIQUE: Multidetector CT imaging of the abdomen and pelvis was performed following the standard protocol before and following the bolus administration of intravenous contrast. CONTRAST:  147mL ISOVUE-300 IOPAMIDOL (ISOVUE-300) INJECTION 61% COMPARISON:  03/30/2017 FINDINGS: Lower chest: Pleural effusions bilateral lung bases. Associated atelectasis/ consolidation. Calcifications of the aortic valve. Native coronary calcifications. Bilateral gynecomastia Hepatobiliary: Interval changes of TIPS shunt which appears patent on the CT study. Similar configuration of the liver with nodular contour and decreasing volume with preferential enlargement of caudate. Differential enhancement of the posterior segment of the right liver, likely flow related given the new shunt. No evidence of pericholecystic fluid or inflammatory changes. High density material within the gallbladder either vicarious excretion of contrast or potentially biliary sludge/ stones. Pancreas: Unremarkable appearance of pancreas. Spleen: Unremarkable spleen. Adrenals/Urinary Tract: Unremarkable adrenal glands. No evidence of left-sided or right-sided hydronephrosis. No perinephric stranding on the left with the right. No nephrolithiasis. There is a focal region of differential enhancement on the lateral cortex of the right kidney which is new from the comparison. Unremarkable appearance of urinary bladder with catheter in position. Stomach/Bowel: Post treatment effects of embolization of the gastric varices with streak artifact at the level of the stomach extending into the esophageal varices system. No abnormally distended small bowel.  No transition point. Rectus seal. 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 No colonic wall thickening. Normal appendix. Lymphatic: Low-density free  fluid within the pelvis. Mesenteric infiltration at the base of the mesenteric with trace free fluid on the liver margin. Trace free fluid in the left upper quadrant adjacent to the spleen. No significant adenopathy. Body wall anasarca/ edema including evidence of scrotal swelling  on the inferior images. Vasculature: Vascular calcifications of the abdominal aorta including mesenteric vessels and bilateral renal arteries. Proximal femoral vasculature is patent. Interval treatment changes of embolization of the portal to systemic shunt system with streak artifact in the gastric an esophageal varices. No evidence of persisting opacification of varices in the stomach. No evidence of nontarget embolization. Reproductive: Unremarkable appearance of the pelvic organs. Other: Abdominal wall anasarca/edema Musculoskeletal: No displaced fracture. Multilevel degenerative changes of the lumbar spine. Vacuum disc phenomenon at L4-L5. IMPRESSION: Interval treatment changes of TIPS and embolization of gastric and esophageal varices. No evidence of persisting varices at the stomach or distal esophagus. Body wall and mesenteric anasarca/edema, with bilateral pleural effusions and small volume ascites in the pelvis. There is also evidence of scrotal edema, incompletely imaged. Focal differential enhancement of the lateral cortex of the right kidney, potentially flow related, or alternatively could represent a new renal infarction or renal infection. If there is concern for urinary tract infection, correlation with urinalysis may be useful. Cirrhotic liver changes. Differential geographic attenuation/enhancement of the posterior lobes of the right liver is favored to be a flow phenomenon given the new shunt. Aortic atherosclerosis and coronary artery disease. Electronically Signed   By: Corrie Mckusick D.O.   On: 04/10/2017 09:07   Dg Chest Port 1 View  Result Date: 04/11/2017 CLINICAL DATA:  Acute respiratory failure with hypoxia.  EXAM: PORTABLE CHEST 1 VIEW COMPARISON:  Radiograph of Apr 09, 2017. FINDINGS: Stable cardiomediastinal silhouette. Atherosclerosis of thoracic aorta is noted. Right internal jugular venous catheter is unchanged with distal tip in expected position of SVC. No pneumothorax is noted. Increased bilateral diffuse lung opacities are noted concerning for edema or possibly inflammation. Probable mild bilateral pleural effusions are noted. Bony thorax is unremarkable. IMPRESSION: Aortic atherosclerosis. Worsening bilateral lung opacities most consistent with worsening edema with associated pleural effusions. Electronically Signed   By: Marijo Conception, M.D.   On: 04/11/2017 07:24   Dg Chest Port 1 View  Result Date: 04/09/2017 CLINICAL DATA:  Hypoxia. EXAM: PORTABLE CHEST 1 VIEW COMPARISON:  Earlier this day at 1836 hours FINDINGS: 1941 hour: Right internal jugular central venous catheter has been retracted, the tip projects over the region of the distal brachiocephalic vein. A persistent loop/ kink is seen proximally in the right supraclavicular region. Diffuse bilateral interstitial opacities, right greater than left, with minimal improvement from recent prior. There are bilateral pleural effusions. Stable heart size and mediastinal contours. No pneumothorax. IMPRESSION: 1. Right internal jugular central venous catheter is been retracted, tip in the region the brachiocephalic vein. There is a persistent loop/kink in the supraclavicular region. 2. Persistent bilateral interstitial opacities likely pulmonary edema, mild improvement from recent prior may be technique related. Pleural effusions are again seen. Electronically Signed   By: Jeb Levering M.D.   On: 04/09/2017 20:07   Dg Chest Port 1 View  Result Date: 04/09/2017 CLINICAL DATA:  Central line placement EXAM: PORTABLE CHEST 1 VIEW COMPARISON:  Chest radiograph 04/05/2017 FINDINGS: There is a right IJ central venous catheter with tip in the lower SVC. A  portion of the proximal catheter is kinked, overlying the medial first rib. It is unclear whether this is internal or external. Diffuse bilateral opacities persist, suggesting severe pulmonary edema. Unchanged cardiomegaly and calcific aortic atherosclerosis. Small bilateral pleural effusions. IMPRESSION: 1. Right IJ central venous catheter tip in the lower SVC. Recommend visual inspection to determine whether the kinked portion is subcutaneous or external. 2. Diffuse pulmonary edema  and small bilateral pleural effusions, consistent with CHF. Electronically Signed   By: Ulyses Jarred M.D.   On: 04/09/2017 18:55    Cardiac Studies   Echo 04/05/17 Study Conclusions  - Left ventricle: The cavity size was normal. Wall thickness was   normal. Systolic function was mildly reduced. The estimated   ejection fraction was in the range of 45% to 50%. Anterior,   apical and inferoapical hypokinesis with a hyperdynamic base. The   study is not technically sufficient to allow evaluation of LV   diastolic function due to E/A fusion. Ejection fraction (MOD,   2-plane): 46%. - Aortic valve: Poorly visualized. Elevated gradients suggest at   least moderate aortic stenosis. Valve area (VTI): 0.83 cm^2.   Valve area (Vmax): 0.76 cm^2. Valve area (Vmean): 0.75 cm^2. - Mitral valve: Calcified annulus. Mildly thickened leaflets . Mild   stenosis. There was trivial regurgitation. Valve area by pressure   half-time: 2.42 cm^2. Valve area by continuity equation (using   LVOT flow): 1.03 cm^2. - Left atrium: The atrium was mildly dilated. - Tricuspid valve: There was mild regurgitation. - Pulmonary arteries: PA peak pressure: 50 mm Hg (S). - Inferior vena cava: The vessel was dilated. The respirophasic   diameter changes were blunted (< 50%), consistent with elevated   central venous pressure.  Impressions:  - Compared to a prior study in 12/2015, the LVEF is reduced from   65-70% to 45-50% with anterior,  apical and inferoapical   hypokinesis and a hyperdynamic base. Findings could suggest LAD   territory ischemia/infarct or Takatsubo cardiomyopathy. Elevated   transvalvular gradients suggestive of at least moderate aortic   valve stenosis.  Patient Profile     66 y.o. male with cirrhosis and gastric varices and recurrent GI bleeding, had circulatory collapse associated with ECG changes suggestive of anterolateral ischemia, resolved after correction of hypotension and anemia using transfusion and pressors. Troponin increased to 33.  Assessment & Plan    CAD/NSTEMI -Troponin max 33. New anterior WMA with EF 45-50%. Not a cath candidate or anticoagulation candidate at this time. Repeat troponin yesterday 2.02. With EF 45-50% low BP should not be from cardiogenic shock. Will try to start low dose aspirin given SEMI  Cirrhosis with severe portal hypertension and bleeding esophageal varices s/p TIPS and sclerotherapy -Hgb stable at 11.1 today. CT abdomen with good resolution of varices. Still with volume overload and ascites. Aldactone added and supplement K. Continues on Lasix 40 mg po bid.  Wt recorded as up 8 lbs from yesterday- bed weight, ? Accuracy. Had 2L urine output.   Shock:   Still on neo. SBP stable in the 90's. Wean pressors as tolerated.  Doubt cardiogenic etiology. Likely 3rd spacing of fluid.  Risk stratification for CAD will have to wait until off pressors and clinically more stable  COPD:  was on 6L O2 this am with O2 sat 84%, NRB placed. Pt desats with talking.    Daune Perch, AGNP-C 04/11/2017  10:52 AM Pager: [phone]  Patient examined chart reviewed. Discussed need for ASA with critical care Dr Ashok Cordia. He has had large MI With troponin 33. Echo confirms RWMA. Hb/Hct have been stable and varices documented to be decompressed by CT scan will start 81 mg ASA. Still on pressors with 3rd spacing and edema in hands and legs. Continue lasix and aldactone Suspect  diagnostic cath may not occur until early next week Rhythm stable and no chest pain  Jenkins Rouge

## 2017-04-12 LAB — CBC WITH DIFFERENTIAL/PLATELET
Basophils Absolute: 0 10*3/uL (ref 0.0–0.1)
Basophils Relative: 0 %
EOS PCT: 4 %
Eosinophils Absolute: 0.3 10*3/uL (ref 0.0–0.7)
HEMATOCRIT: 32.1 % — AB (ref 39.0–52.0)
Hemoglobin: 10.2 g/dL — ABNORMAL LOW (ref 13.0–17.0)
LYMPHS ABS: 1.1 10*3/uL (ref 0.7–4.0)
LYMPHS PCT: 12 %
MCH: 29 pg (ref 26.0–34.0)
MCHC: 31.8 g/dL (ref 30.0–36.0)
MCV: 91.2 fL (ref 78.0–100.0)
Monocytes Absolute: 0.8 10*3/uL (ref 0.1–1.0)
Monocytes Relative: 9 %
NEUTROS ABS: 6.5 10*3/uL (ref 1.7–7.7)
NEUTROS PCT: 75 %
PLATELETS: 68 10*3/uL — AB (ref 150–400)
RBC: 3.52 MIL/uL — AB (ref 4.22–5.81)
RDW: 17.5 % — ABNORMAL HIGH (ref 11.5–15.5)
WBC: 8.6 10*3/uL (ref 4.0–10.5)

## 2017-04-12 LAB — RENAL FUNCTION PANEL
ALBUMIN: 1.8 g/dL — AB (ref 3.5–5.0)
ANION GAP: 6 (ref 5–15)
BUN: 13 mg/dL (ref 6–20)
CHLORIDE: 101 mmol/L (ref 101–111)
CO2: 30 mmol/L (ref 22–32)
Calcium: 7.4 mg/dL — ABNORMAL LOW (ref 8.9–10.3)
Creatinine, Ser: 0.95 mg/dL (ref 0.61–1.24)
Glucose, Bld: 121 mg/dL — ABNORMAL HIGH (ref 65–99)
PHOSPHORUS: 2 mg/dL — AB (ref 2.5–4.6)
POTASSIUM: 3 mmol/L — AB (ref 3.5–5.1)
Sodium: 137 mmol/L (ref 135–145)

## 2017-04-12 LAB — MAGNESIUM: Magnesium: 1.7 mg/dL (ref 1.7–2.4)

## 2017-04-12 MED ORDER — POTASSIUM CHLORIDE 10 MEQ/50ML IV SOLN: 10.0000 meq | INTRAVENOUS | Status: DC

## 2017-04-12 MED ORDER — POTASSIUM CHLORIDE CRYS ER 20 MEQ PO TBCR
40.0000 meq | EXTENDED_RELEASE_TABLET | ORAL | Status: AC
  Administered 2017-04-12 (×2): 40 meq via ORAL
  Filled 2017-04-12 (×2): qty 2

## 2017-04-12 MED ORDER — LACTULOSE 10 GM/15ML PO SOLN
10.0000 g | Freq: Two times a day (BID) | ORAL | Status: DC
  Administered 2017-04-12 – 2017-04-17 (×8): 10 g via ORAL
  Filled 2017-04-12 (×11): qty 15

## 2017-04-12 MED ORDER — ADULT MULTIVITAMIN W/MINERALS CH
1.0000 | ORAL_TABLET | Freq: Every day | ORAL | Status: DC
  Administered 2017-04-12 – 2017-04-13 (×2): 1 via ORAL
  Filled 2017-04-12 (×2): qty 1

## 2017-04-12 NOTE — Progress Notes (Signed)
Discussed patient with Dr. Ashok Cordia. IM teaching service will resume care on 04/13/2017.

## 2017-04-12 NOTE — Progress Notes (Signed)
[facility] ADULT ICU REPLACEMENT PROTOCOL FOR AM LAB REPLACEMENT ONLY  The patient does apply for the [facility] Adult ICU Electrolyte Replacment Protocol based on the criteria listed below:   1. Is GFR >/= 40 ml/min? Yes.    Patient's GFR today is >60 2. Is urine output >/= 0.5 ml/kg/hr for the last 6 hours? Yes.   Patient's UOP is 1.5 ml/kg/hr 3. Is BUN < 60 mg/dL? Yes.    Patient's BUN today is 13 4. Abnormal electrolyte(s): 3.0 5. Ordered repletion with per rpotocol 6. If a panic level lab has been reported, has the CCM MD in charge been notified? Yes.  .   Physician:  Dr. Cloretta Ned, Philis Nettle 04/12/2017 4:59 AM

## 2017-04-12 NOTE — Progress Notes (Signed)
Physical Therapy Treatment Patient Details Name: John Warren MRN: [id number] DOB: 1950/12/14 Today's Date: 04/12/2017    History of Present Illness  66 yo admitted with recurrent GIB s/p S/P TIPS & BATO (Balloon-occluded Antegrade Transvenous Obliteration of Gastric Varices 5/3, ETT 5/3-5/7, with hypotension. PMHx: cirrhosis, GIB, CHF    PT Comments    Pt with marked improvement from yesterday able to ambulate around the unit. Maintaining O2 sats on 6L with gait and talking. Educated for transfers and HEP with encouragement to continue mobility daily with nursing assist.   BP 114/53 sitting sats 90-95% on 6L Hr 99-112   Follow Up Recommendations  Home health PT;Supervision/Assistance - 24 hour     Equipment Recommendations  Rolling walker with 5" wheels;3in1 (PT)    Recommendations for Other Services       Precautions / Restrictions Precautions Precautions: Fall    Mobility  Bed Mobility Overal bed mobility: Needs Assistance Bed Mobility: Supine to Sit     Supine to sit: Supervision;HOB elevated     General bed mobility comments: increased time with use of rail and HOB 25degrees  Transfers Overall transfer level: Needs assistance     Sit to Stand: Min guard Stand pivot transfers: Min guard       General transfer comment: cues for hand placement and safety  Ambulation/Gait Ambulation/Gait assistance: Min guard Ambulation Distance (Feet): 150 Feet Assistive device: Rolling walker (2 wheeled) Gait Pattern/deviations: Step-through pattern;Trunk flexed;Decreased stride length   Gait velocity interpretation: Below normal speed for age/gender General Gait Details: cues for looking up, position in RW, posture, breathing technique pt able to maintain sats on 6L >90%   Stairs            Wheelchair Mobility    Modified Rankin (Stroke Patients Only)       Balance Overall balance assessment: No apparent balance deficits (not formally assessed)                                           Cognition Arousal/Alertness: Awake/alert Behavior During Therapy: WFL for tasks assessed/performed Overall Cognitive Status: Within Functional Limits for tasks assessed                                        Exercises General Exercises - Lower Extremity Long Arc Quad: AROM;Both;Seated;15 reps Hip Flexion/Marching: Both;Seated;15 reps;AROM    General Comments        Pertinent Vitals/Pain Pain Score: 4  Pain Location: abdomen Pain Descriptors / Indicators: Sore Pain Intervention(s): Limited activity within patient's tolerance    Home Living                      Prior Function            PT Goals (current goals can now be found in the care plan section) Progress towards PT goals: Progressing toward goals    Frequency           PT Plan Current plan remains appropriate    Co-evaluation              AM-PAC PT "6 Clicks" Daily Activity  Outcome Measure  Difficulty turning over in bed (including adjusting bedclothes, sheets and blankets)?: A Lot Difficulty moving from lying on back to sitting on  the side of the bed? : A Lot Difficulty sitting down on and standing up from a chair with arms (e.g., wheelchair, bedside commode, etc,.)?: A Little Help needed moving to and from a bed to chair (including a wheelchair)?: A Little Help needed walking in hospital room?: A Little Help needed climbing 3-5 steps with a railing? : A Lot 6 Click Score: 15    End of Session Equipment Utilized During Treatment: Gait belt;Oxygen Activity Tolerance: Patient tolerated treatment well Patient left: with call bell/phone within reach;with family/visitor present;Other (comment) Nurse Communication: Mobility status PT Visit Diagnosis: Difficulty in walking, not elsewhere classified (R26.2)     Time: 8185-6314 PT Time Calculation (min) (ACUTE ONLY): 21 min  Charges:  $Gait Training: 8-22 mins                     G Codes:       Elwyn Reach, PT [phone]    [place] 04/12/2017, 10:07 AM

## 2017-04-12 NOTE — Progress Notes (Signed)
Pharmacy Antibiotic Note  John Warren is a 66 y.o. male admitted on 03/29/2017 with possible pneumonia and/or TRALI.  Today is day #8 of flagyl/cefepime. WBC down to wnl, Afeb, PCT 1.19. SCr stable 0.95. All cultures are negative (final).    Plan: Cefepime 1 g IV q8h Flagyl 500 mg IV q8h  Monitor renal function, clinical progress F/u duration of abx, d/c 5/11?   Height: 5\' 11"  (180.3 cm) Weight: 226 lb 10.1 oz (102.8 kg) IBW/kg (Calculated) : 75.3  Temp (24hrs), Avg:98.3 F (36.8 C), Min:98 F (36.7 C), Max:99.3 F (37.4 C)   Recent Labs Lab 04/05/17 0923  04/08/17 0358 04/09/17 0410 04/10/17 0400 04/11/17 0445 04/11/17 0455 04/12/17 0408  WBC  --   < > 14.6* 14.2* 14.7*  --  19.7* 8.6  CREATININE  --   < > 0.92 0.96 1.02  1.02 0.89  --  0.95  LATICACIDVEN 3.1*  --   --   --   --   --   --   --   < > = values in this interval not displayed.  Estimated Creatinine Clearance: 90.8 mL/min (by C-G formula based on SCr of 0.95 mg/dL).    Allergies  Allergen Reactions  . Penicillins Rash    Has patient had a PCN reaction causing immediate rash, facial/tongue/throat swelling, SOB or lightheadedness with hypotension: Yes Has patient had a PCN reaction causing severe rash involving mucus membranes or skin necrosis: No Has patient had a PCN reaction that required hospitalization: No Has patient had a PCN reaction occurring within the last 10 years: No If all of the above answers are "NO", then may proceed with Cephalosporin use. Had CTX before    Antimicrobials this admission:  Ceftriaxone 4/28 >> 5/2 Vancomycin 5/4 >> 5/8 Cefepime 5/4 >> Flagyl 5/4 >>  Microbiology results:  4/28 Blood x2 - ngF 4/27 MRSA PCR - neg 5/4 Sputum >> candida albicans    Elicia Lamp, PharmD, BCPS Clinical Pharmacist 04/12/2017 8:44 AM

## 2017-04-12 NOTE — Progress Notes (Signed)
[facility]  Presenting HPI:  66 y.o. male with alcoholic cirrhosis, portal gastropathy, and prior GI bleeds. Patient has had 3 admissions since January 2017 with GI bleeding. Last admitted 03/30/17 with recurrent GI bleed starting the day prior. Endorse bright red blood per rectum after a dark, tarry bowel movement. No known NSAID use or anticoagulants. Reportedly quit using alcohol 1 year ago. Patient was admitted and given packed red blood cells and FFP. Initially started on a PPI and octreotide. Difficult to localize bleeding source on CT imaging. Patient subsequently underwent BRTO on 5/4. Following colonoscopy on 4/30 patient had worsening hypotension. Patient was transfused 3 units packed red blood cells, given IV albumin, and started on phenylephrine infusion. Central line was placed as well as arterial line. PCCM was consulted for further management. Patient's blood pressure seemed to respond well to albumin, neo, and transfusion. He was transferred to the ICU but further declined after transfer requiring endotracheal intubation for respiratory insufficiency.  Subjective:  Low K (replaced by [facility]), platelets decreased to 68.  Afebrile / VSS.  O2 weaned to 6L via nasal cannula  Temp:  [98 F (36.7 C)-99.3 F (37.4 C)] 98 F (36.7 C) (05/11 0714) Pulse Rate:  [86-108] 105 (05/11 0600) Resp:  [12-31] 16 (05/11 0600) BP: (88-109)/(37-75) 105/50 (05/11 0600) SpO2:  [89 %-99 %] 94 % (05/11 0736) Arterial Line BP: (93-131)/(42-49) 117/46 (05/10 1630) Weight:  [226 lb 10.1 oz (102.8 kg)] 226 lb 10.1 oz (102.8 kg) (05/11 0500)  General: chronically ill appearing male in NAD HEENT: MM pink/moist PSY: calm/appropriate Neuro: AAOx4, speech clear, MAE, generalized weakness  CV: s1s2 rrr, 3/6 SEM PULM: even/non-labored, lungs bilaterally clear anterior/lateral  EM:LJQG, non-tender, bsx4 active  Extremities: warm/dry, generalized 1-2+ pitting edema, greater in LE's    Skin: no rashes or lesions   LINES/TUBES: OETT 5/3 - 5/7 R IJ CVL 5/3 >> 5/11 Art Line 5/3 >> out Foley >> PIV  CBC Latest Ref Rng & Units 04/12/2017 04/11/2017 04/10/2017  WBC 4.0 - 10.5 K/uL 8.6 19.7(H) 14.7(H)  Hemoglobin 13.0 - 17.0 g/dL 10.2(L) 11.1(L) 11.3(L)  Hematocrit 39.0 - 52.0 % 32.1(L) 35.5(L) 37.3(L)  Platelets 150 - 400 K/uL 68(L) 149(L) 129(L)    BMP Latest Ref Rng & Units 04/12/2017 04/11/2017 04/10/2017  Glucose 65 - 99 mg/dL 121(H) 141(H) 107(H)  BUN 6 - 20 mg/dL '13 14 13  ' Creatinine 0.61 - 1.24 mg/dL 0.95 0.89 1.02  Sodium 135 - 145 mmol/L 137 137 141  Potassium 3.5 - 5.1 mmol/L 3.0(L) 3.2(L) 2.9(L)  Chloride 101 - 111 mmol/L 101 101 99(L)  CO2 22 - 32 mmol/L 30 28 32  Calcium 8.9 - 10.3 mg/dL 7.4(L) 7.5(L) 7.3(L)   Hepatic Function Latest Ref Rng & Units 04/12/2017 04/11/2017 04/10/2017  Total Protein 6.5 - 8.1 g/dL - - -  Albumin 3.5 - 5.0 g/dL 1.8(L) 2.0(L) 2.2(L)  AST 15 - 41 U/L - - -  ALT 17 - 63 U/L - - -  Alk Phosphatase 38 - 126 U/L - - -  Total Bilirubin 0.3 - 1.2 mg/dL - - -  Bilirubin, Direct 0.1 - 0.5 mg/dL - - -    IMAGING/STUDIES: TTE 5/4: LV normal in size with EF 45-50% & normal wall thickness. Hypokinesis of anterior, apical, and inferoapical walls. Insufficient study to assess diastolic function. LA mildly dilated. Main pulmonary artery normal in size. Aortic valve poorly visualized but gradients suggestive of at least moderate aortic stenosis. Mild mitral stenosis.  Trivial pulmonic regurgitation. Mild tricuspid regurgitation. No pericardial effusion. PORT CXR 5/4:  Film rotated left. Patchy bilateral lower lobe opacities as well as reticulation indicating edema. Endotracheal tube and right internal jugular central venous catheter in good position. CT ABD/Pelvis 5/8: Interval treatment changes of TIPS and embolization of gastric and esophageal varices, no evidence of persisting varices at the stomach or distal esophagus, body wall and mesenteric  anasarca/edema, with bilateral pleural effusions and small volume ascites in the pelvis, scrotal edema, cirrhotic liver changes PORT CXR 5/10: Bilateral lower lobe opacities slightly worsening with silhouetting of right hemidiaphragm. Alveolar component and mid lung zones as well. Right internal jugular central venous catheter in good position.  MICROBIOLOGY: MRSA PCR 4/27: Negative Blood Cultures x2 4/28: Negative  Tracheal Aspirate Culture 5/4: Rare C albicans  ANTIBIOTICS: Rocephin 4/28 - 5/2 Vancomycin 5/4 - 5/8 Cefepime 5/4 >> 5/11 Flagyl 5/4 >> 5/11  SIGNIFICANT EVENTS: 04/27 - Admit 04/28 - EGD demonstrated type I isolated gastric varices without bleeding and a single nonbleeding angiodysplastic lesion within the stomach. Normal duodenum. 04/30 - Colonoscopy demonstrated a large amount of fresh and old blood along with clots in the entire colon along with diverticulosis in the sigmoid and descending colon without active bleeding. Nonbleeding internal hemorrhoids. 05/02 - Capsule Endoscopy demonstrated small nonbleeding AVM in the mid small bowel with one nonspecific erosion in the small bowel. No evidence of blood in the small bowel or colon though poor visualization. 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 male with ETOH cirrhosis admitted with GIB/shock.  Steadily improving shock. Neurological status remains stable.  Started on midodrine for hypotension after TIPS.   Shock - improved on midodrine, suspect this may be his new normal after TIPS P: Continue midodrine 2.5 mg TID  Discontinue central line once PIV's confirmed functional  Monitor BP trend  Transfer to telemetry & TRH     Acute hypoxic respiratory failure - improving, O2 needs down to 6L via [place], off high  flow P: Continue gentle diuresis > Lasix 40 mg PO BID + Spironolactone 25 mg QD Pulmonary hygiene  Intermittent CXR    Acute blood loss anemia - in setting of variceal bleed, stabilized  P: Monitor for bleeding  Trend H/H   Continue folic acid, thiamine + MVI   Non-STEMI - in setting of shock + anemia  P: Cardiology following, appreciate assistance  ASA 81 mg QD Telemetry monitoring    Possible sepsis - completed 7 days of cefepime + flagyl  P: Monitor fever curve / WBC trend  If new fevers, reculture   COPD - without acute exacerbation  P: Continue pulmicort BID + Duonebs   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 Continuing lactulose and rifaximin twice a day. Will need GI follow up at discharge   Acute systolic and chronic grade 2 diastolic congestive heart failure P: Continue lasix + aldactone as above  No ACE due to hypotension    Acute encephalopathy - multifactorial in the setting of shock, resolved  P: Supportive care Continue rifaximin, lactulose, thiamine & folic acid    Hypokalemia  P: K replaced per [facility] Follow up BMP in am    Nipple Pain - chronic x1 year, suspect gynecomastia in  the setting of hepatic disease P: Follow up with PCP as outpatient   Prophylaxis: SCDs &Protonix PO BID.  Diet: Heart Healthy Diet as tolerated.  Code Status: Full Code  Disposition:  Transfer to telemetry and to [facility] as of 5/12 am  Family Update:  Patient & significant other updated on am rounds    Noe Gens, NP-C [place] Pgr: [phone] or if no answer [phone] 04/12/2017, 7:47 AM

## 2017-04-12 NOTE — Progress Notes (Signed)
Progress Note  Patient Name: John Warren Date of Encounter: 04/12/2017  Primary Cardiologist: Dr. Wynonia Lawman  Subjective   There were no acute events overnight. Pt denies any dyspnea or chest pain. He was feeling well this morning, and was eating breakfast when examined    Inpatient Medications    Scheduled Meds: . aspirin EC  81 mg Oral Daily  . budesonide (PULMICORT) nebulizer solution  0.5 mg Nebulization BID  . Chlorhexidine Gluconate Cloth  6 each Topical Daily  . feeding supplement (ENSURE ENLIVE)  237 mL Oral BID BM  . folic acid  1 mg Oral Daily  . furosemide  40 mg Oral BID  . ipratropium-albuterol  3 mL Nebulization TID  . lactulose  10 g Oral BID  . mouth rinse  15 mL Mouth Rinse BID  . midodrine  2.5 mg Oral TID WC  . pantoprazole  40 mg Oral BID  . potassium chloride  40 mEq Oral Q4H  . rifaximin  550 mg Oral BID  . sodium chloride flush  10-40 mL Intracatheter Q12H  . spironolactone  25 mg Oral Daily  . thiamine  100 mg Oral Daily   Continuous Infusions: . sodium chloride 10 mL/hr at 04/12/17 0900  . phenylephrine 80mg /556mL (0.16mg /mL) infusion Stopped (04/11/17 1730)   PRN Meds: acetaminophen **OR** acetaminophen, diphenhydrAMINE, sodium chloride flush   Vital Signs    Vitals:   04/12/17 0600 04/12/17 0714 04/12/17 0723 04/12/17 0736  BP: (!) 105/50     Pulse: (!) 105     Resp: 16     Temp:  98 F (36.7 C)    TempSrc:  Oral    SpO2: 95%  95% 94%  Weight:      Height:        Intake/Output Summary (Last 24 hours) at 04/12/17 0952 Last data filed at 04/12/17 0900  Gross per 24 hour  Intake          1458.83 ml  Output             1250 ml  Net           208.83 ml   Filed Weights   04/10/17 0426 04/11/17 0717 04/12/17 0500  Weight: 228 lb 2.8 oz (103.5 kg) 236 lb 5.3 oz (107.2 kg) 226 lb 10.1 oz (102.8 kg)    Telemetry     NSR 80's- Personally Reviewed  ECG    ST rate 121 no acute St changes 04/05/17  Physical Exam   Affect  appropriate Chronically ill white male  , eating breakfast  HEENT: normal except for scleral icterus   Neck supple with no adenopathy right IJ  JVP normal no bruits no thyromegaly Lungs clear with End expiratory wheezes Heart:  S1/S2 no murmur, no rub, gallop or click Abdomen: ascites  But nontender nondistended  no bruit. Distal pulses intact with no bruits Plus 2 LE  Edema, L>R Neuro non-focal Skin warm and dry Generalized weakness    Labs    Chemistry  Recent Labs Lab 04/10/17 0400 04/11/17 0445 04/12/17 0408  NA 141  141 137 137  K 2.9*  2.9* 3.2* 3.0*  CL 99*  99* 101 101  CO2 32  32 28 30  GLUCOSE 107*  107* 141* 121*  BUN 13  13 14 13   CREATININE 1.02  1.02 0.89 0.95  CALCIUM 7.3*  7.3* 7.5* 7.4*  ALBUMIN 2.2* 2.0* 1.8*  GFRNONAA >60  >60 >60 >60  GFRAA >60  >  60 >60 >60  ANIONGAP 10  10 8 6      Hematology  Recent Labs Lab 04/10/17 0400 04/11/17 0455 04/12/17 0408  WBC 14.7* 19.7* 8.6  RBC 4.10* 3.97* 3.52*  HGB 11.3* 11.1* 10.2*  HCT 37.3* 35.5* 32.1*  MCV 91.0 89.4 91.2  MCH 27.6 28.0 29.0  MCHC 30.3 31.3 31.8  RDW 17.4* 17.3* 17.5*  PLT 129* 149* 68*    Cardiac Enzymes  Recent Labs Lab 04/10/17 1126  TROPONINI 2.02*   No results for input(s): TROPIPOC in the last 168 hours.   BNP No results for input(s): BNP, PROBNP in the last 168 hours.   DDimer No results for input(s): DDIMER in the last 168 hours.   Radiology    Dg Chest Port 1 View  Result Date: 04/11/2017 CLINICAL DATA:  Acute respiratory failure with hypoxia. EXAM: PORTABLE CHEST 1 VIEW COMPARISON:  Radiograph of Apr 09, 2017. FINDINGS: Stable cardiomediastinal silhouette. Atherosclerosis of thoracic aorta is noted. Right internal jugular venous catheter is unchanged with distal tip in expected position of SVC. No pneumothorax is noted. Increased bilateral diffuse lung opacities are noted concerning for edema or possibly inflammation. Probable mild bilateral pleural  effusions are noted. Bony thorax is unremarkable. IMPRESSION: Aortic atherosclerosis. Worsening bilateral lung opacities most consistent with worsening edema with associated pleural effusions. Electronically Signed   By: Marijo Conception, M.D.   On: 04/11/2017 07:24    Cardiac Studies   Echo 04/05/17 Study Conclusions  - Left ventricle: The cavity size was normal. Wall thickness was   normal. Systolic function was mildly reduced. The estimated   ejection fraction was in the range of 45% to 50%. Anterior,   apical and inferoapical hypokinesis with a hyperdynamic base. The   study is not technically sufficient to allow evaluation of LV   diastolic function due to E/A fusion. Ejection fraction (MOD,   2-plane): 46%. - Aortic valve: Poorly visualized. Elevated gradients suggest at   least moderate aortic stenosis. Valve area (VTI): 0.83 cm^2.   Valve area (Vmax): 0.76 cm^2. Valve area (Vmean): 0.75 cm^2. - Mitral valve: Calcified annulus. Mildly thickened leaflets . Mild   stenosis. There was trivial regurgitation. Valve area by pressure   half-time: 2.42 cm^2. Valve area by continuity equation (using   LVOT flow): 1.03 cm^2. - Left atrium: The atrium was mildly dilated. - Tricuspid valve: There was mild regurgitation. - Pulmonary arteries: PA peak pressure: 50 mm Hg (S). - Inferior vena cava: The vessel was dilated. The respirophasic   diameter changes were blunted (< 50%), consistent with elevated   central venous pressure.  Impressions:  - Compared to a prior study in 12/2015, the LVEF is reduced from   65-70% to 45-50% with anterior, apical and inferoapical   hypokinesis and a hyperdynamic base. Findings could suggest LAD   territory ischemia/infarct or Takatsubo cardiomyopathy. Elevated   transvalvular gradients suggestive of at least moderate aortic   valve stenosis.  Patient Profile     66 y.o. male with cirrhosis and gastric varices and recurrent GI bleeding, had  circulatory collapse associated with ECG changes suggestive of anterolateral ischemia, resolved after correction of hypotension and anemia using transfusion and pressors. Troponin increased to 33.  Assessment & Plan    CAD/NSTEMI -Troponin max 33 which had decreased to 2.02 5/9.  New anterior WMA with EF 45-50%. Not a cath candidate or anticoagulation candidate at this time.  With EF 45-50%. low BP should not be from cardiogenic shock. Pt  has been started on aspirin.   -diagnostic cath most likely next week.  -continue aspirin -monitor HgB  Cirrhosis with severe portal hypertension and bleeding esophageal varices s/p TIPS and sclerotherapy -Hgb 11--> 10 today. CT abdomen with good resolution of varices. Still with volume overload and ascites. Aldactone added and supplement K. Continues on Lasix 40 mg po bid.  Had 1.3 L urine output  Shock:   Still on neo. SBP stable in the 90's. Wean pressors as tolerated.  Doubt cardiogenic etiology. Likely 3rd spacing of fluid.  -continue midodrine   Risk stratification for CAD will have to wait until off pressors and clinically more stable  COPD:  Is on 6L [place]   Signed Burgess Estelle  MD PGY-2 IMTS  Patient examined chart reviewed. Explained to him yesterday reasoning for ASA He is finally off pressors Still with edema in scrotom LE;s and hands. Will tentatively Arrange for cath on Monday Hb stable varices have been decompressed with TIPS Documented by f/u CT.  No chest pain troponin has come down  [employer]

## 2017-04-12 NOTE — Care Management Note (Addendum)
Case Management Note  Patient Details  Name: John Warren MRN: [id number] Date of Birth: [birth year]  Subjective/Objective:    Pt admitted with GI bleed                Action/Plan:  PTA from home .  CM will continue to follow for discharge needs   Expected Discharge Date:                  Expected Discharge Plan:  Home/Self Care  In-House Referral:     Discharge planning Services  CM Consult  Post Acute Care Choice:    Choice offered to:     DME Arranged:    DME Agency:     HH Arranged:    HH Agency:     Status of Service:  In process, will continue to follow  If discussed at Long Length of Stay Meetings, dates discussed:    Additional Comments: 04/12/2017  CM provided pt list of [facility] agencies as requested.  CM requested order via physician sticky tab  Pt alert and oriented.  Pt states he is completely independent at home.  Girlfriend will provide recommended supervision at discharge.    5/9 Pt extubated 04/08/17.  PT eval ordered Maryclare Labrador, RN 04/12/2017, 12:06 PM

## 2017-04-13 DIAGNOSIS — I959 Hypotension, unspecified: Secondary | ICD-10-CM

## 2017-04-13 DIAGNOSIS — R601 Generalized edema: Secondary | ICD-10-CM

## 2017-04-13 DIAGNOSIS — I2510 Atherosclerotic heart disease of native coronary artery without angina pectoris: Secondary | ICD-10-CM | POA: Diagnosis present

## 2017-04-13 DIAGNOSIS — I864 Gastric varices: Secondary | ICD-10-CM | POA: Diagnosis present

## 2017-04-13 DIAGNOSIS — Y95 Nosocomial condition: Secondary | ICD-10-CM

## 2017-04-13 DIAGNOSIS — I472 Ventricular tachycardia: Secondary | ICD-10-CM

## 2017-04-13 DIAGNOSIS — I25118 Atherosclerotic heart disease of native coronary artery with other forms of angina pectoris: Secondary | ICD-10-CM

## 2017-04-13 DIAGNOSIS — I4729 Other ventricular tachycardia: Secondary | ICD-10-CM

## 2017-04-13 DIAGNOSIS — K7290 Hepatic failure, unspecified without coma: Secondary | ICD-10-CM

## 2017-04-13 DIAGNOSIS — E8770 Fluid overload, unspecified: Secondary | ICD-10-CM

## 2017-04-13 LAB — RENAL FUNCTION PANEL
Albumin: 2 g/dL — ABNORMAL LOW (ref 3.5–5.0)
Anion gap: 7 (ref 5–15)
BUN: 11 mg/dL (ref 6–20)
CHLORIDE: 102 mmol/L (ref 101–111)
CO2: 27 mmol/L (ref 22–32)
CREATININE: 0.98 mg/dL (ref 0.61–1.24)
Calcium: 7.6 mg/dL — ABNORMAL LOW (ref 8.9–10.3)
GFR calc non Af Amer: >60 mL/min (ref >60)
Glucose, Bld: 105 mg/dL — ABNORMAL HIGH (ref 65–99)
POTASSIUM: 3.6 mmol/L (ref 3.5–5.1)
Phosphorus: 2.2 mg/dL — ABNORMAL LOW (ref 2.5–4.6)
Sodium: 136 mmol/L (ref 135–145)

## 2017-04-13 LAB — CBC WITH DIFFERENTIAL/PLATELET
Basophils Absolute: 0 10*3/uL (ref 0.0–0.1)
Basophils Relative: 0 %
EOS ABS: 0.5 10*3/uL (ref 0.0–0.7)
EOS PCT: 4 %
HCT: 34.5 % — ABNORMAL LOW (ref 39.0–52.0)
HEMOGLOBIN: 10.8 g/dL — AB (ref 13.0–17.0)
LYMPHS ABS: 1.1 10*3/uL (ref 0.7–4.0)
Lymphocytes Relative: 10 %
MCH: 27.9 pg (ref 26.0–34.0)
MCHC: 31.3 g/dL (ref 30.0–36.0)
MCV: 89.1 fL (ref 78.0–100.0)
Monocytes Absolute: 1.1 10*3/uL — ABNORMAL HIGH (ref 0.1–1.0)
Monocytes Relative: 10 %
Neutro Abs: 8.6 10*3/uL — ABNORMAL HIGH (ref 1.7–7.7)
Neutrophils Relative %: 77 %
Platelets: 115 10*3/uL — ABNORMAL LOW (ref 150–400)
RBC: 3.87 MIL/uL — ABNORMAL LOW (ref 4.22–5.81)
RDW: 17.5 % — ABNORMAL HIGH (ref 11.5–15.5)
WBC: 11.2 10*3/uL — ABNORMAL HIGH (ref 4.0–10.5)

## 2017-04-13 LAB — MAGNESIUM: MAGNESIUM: 1.9 mg/dL (ref 1.7–2.4)

## 2017-04-13 MED ORDER — FUROSEMIDE 10 MG/ML IJ SOLN
40.0000 mg | Freq: Two times a day (BID) | INTRAMUSCULAR | Status: DC
  Administered 2017-04-13 – 2017-04-15 (×4): 40 mg via INTRAVENOUS
  Filled 2017-04-13 (×4): qty 4

## 2017-04-13 MED ORDER — MAGNESIUM SULFATE 2 GM/50ML IV SOLN
2.0000 g | Freq: Once | INTRAVENOUS | Status: AC
  Administered 2017-04-13: 2 g via INTRAVENOUS
  Filled 2017-04-13: qty 50

## 2017-04-13 MED ORDER — HEPARIN SODIUM (PORCINE) 5000 UNIT/ML IJ SOLN: 5000.0000 [IU] | Freq: Three times a day (TID) | INTRAMUSCULAR | Status: DC

## 2017-04-13 MED ORDER — POTASSIUM CHLORIDE 20 MEQ PO PACK
60.0000 meq | PACK | Freq: Once | ORAL | Status: AC
  Administered 2017-04-13: 60 meq via ORAL
  Filled 2017-04-13: qty 3

## 2017-04-13 NOTE — Progress Notes (Signed)
Progress Note  Patient Name: John Warren Date of Encounter: 04/13/2017  Primary Cardiologist: Wynonia Lawman  Subjective   Denies chest pain and shortness of breath.   Inpatient Medications    Scheduled Meds: . aspirin EC  81 mg Oral Daily  . budesonide (PULMICORT) nebulizer solution  0.5 mg Nebulization BID  . Chlorhexidine Gluconate Cloth  6 each Topical Daily  . feeding supplement (ENSURE ENLIVE)  237 mL Oral BID BM  . folic acid  1 mg Oral Daily  . furosemide  40 mg Oral BID  . ipratropium-albuterol  3 mL Nebulization TID  . lactulose  10 g Oral BID  . mouth rinse  15 mL Mouth Rinse BID  . midodrine  2.5 mg Oral TID WC  . multivitamin with minerals  1 tablet Oral Daily  . pantoprazole  40 mg Oral BID  . rifaximin  550 mg Oral BID  . spironolactone  25 mg Oral Daily  . thiamine  100 mg Oral Daily   Continuous Infusions: . sodium chloride 10 mL/hr at 04/12/17 0900   PRN Meds: acetaminophen **OR** acetaminophen, diphenhydrAMINE   Vital Signs    Vitals:   04/12/17 2220 04/13/17 0524 04/13/17 0930 04/13/17 1018  BP: (!) 111/51 (!) 95/43  (!) 98/44  Pulse: (!) 107 96  (!) 106  Resp: 18 18  (!) 148  Temp: 98.6 F (37 C) 98.3 F (36.8 C)  98.2 F (36.8 C)  TempSrc: Oral Oral  Oral  SpO2: 91% 93% 95% 99%  Weight: 231 lb 0.7 oz (104.8 kg)     Height:        Intake/Output Summary (Last 24 hours) at 04/13/17 1103 Last data filed at 04/13/17 0600  Gross per 24 hour  Intake              720 ml  Output             1050 ml  Net             -330 ml   Filed Weights   04/11/17 0717 04/12/17 0500 04/12/17 2220  Weight: 236 lb 5.3 oz (107.2 kg) 226 lb 10.1 oz (102.8 kg) 231 lb 0.7 oz (104.8 kg)    Telemetry    Sinus rhythm/sinus tachycardia with 7 beat run of NSVT - Personally Reviewed  ECG     - Personally Reviewed  Physical Exam   GEN: No acute distress.   Neck: No JVD Cardiac: RRR, 3/6 apical and left sternal border holosystolic murmur, 2/6 ejection  systolic murmur over RUSB, no rubs or gallops.  Respiratory: Clear to auscultation bilaterally. GI: Soft, nontender mildly distended  MS: 2+ pitting pretibial edema b/l Neuro:  Nonfocal  Psych: Normal affect   Labs    Chemistry Recent Labs Lab 04/11/17 0445 04/12/17 0408 04/13/17 0531  NA 137 137 136  K 3.2* 3.0* 3.6  CL 101 101 102  CO2 28 30 27   GLUCOSE 141* 121* 105*  BUN 14 13 11   CREATININE 0.89 0.95 0.98  CALCIUM 7.5* 7.4* 7.6*  ALBUMIN 2.0* 1.8* 2.0*  GFRNONAA >60 >60 >60  GFRAA >60 >60 >60  ANIONGAP 8 6 7      Hematology Recent Labs Lab 04/11/17 0455 04/12/17 0408 04/13/17 0531  WBC 19.7* 8.6 11.2*  RBC 3.97* 3.52* 3.87*  HGB 11.1* 10.2* 10.8*  HCT 35.5* 32.1* 34.5*  MCV 89.4 91.2 89.1  MCH 28.0 29.0 27.9  MCHC 31.3 31.8 31.3  RDW 17.3* 17.5* 17.5*  PLT 149* 68* 115*    Cardiac Enzymes Recent Labs Lab 04/10/17 1126  TROPONINI 2.02*   No results for input(s): TROPIPOC in the last 168 hours.   BNPNo results for input(s): BNP, PROBNP in the last 168 hours.   DDimer No results for input(s): DDIMER in the last 168 hours.   Radiology    No results found.  Cardiac Studies   Echocardiogram 04/05/17:  Study Conclusions  - Left ventricle: The cavity size was normal. Wall thickness was   normal. Systolic function was mildly reduced. The estimated   ejection fraction was in the range of 45% to 50%. Anterior,   apical and inferoapical hypokinesis with a hyperdynamic base. The   study is not technically sufficient to allow evaluation of LV   diastolic function due to E/A fusion. Ejection fraction (MOD,   2-plane): 46%. - Aortic valve: Poorly visualized. Elevated gradients suggest at   least moderate aortic stenosis. Valve area (VTI): 0.83 cm^2.   Valve area (Vmax): 0.76 cm^2. Valve area (Vmean): 0.75 cm^2. - Mitral valve: Calcified annulus. Mildly thickened leaflets . Mild   stenosis. There was trivial regurgitation. Valve area by pressure    half-time: 2.42 cm^2. Valve area by continuity equation (using   LVOT flow): 1.03 cm^2. - Left atrium: The atrium was mildly dilated. - Tricuspid valve: There was mild regurgitation. - Pulmonary arteries: PA peak pressure: 50 mm Hg (S). - Inferior vena cava: The vessel was dilated. The respirophasic   diameter changes were blunted (< 50%), consistent with elevated   central venous pressure.  Impressions:  - Compared to a prior study in 12/2015, the LVEF is reduced from   65-70% to 45-50% with anterior, apical and inferoapical   hypokinesis and a hyperdynamic base. Findings could suggest LAD   territory ischemia/infarct or Takatsubo cardiomyopathy. Elevated   transvalvular gradients suggestive of at least moderate aortic   valve stenosis.  Patient Profile     66 y.o. male with cirrhosis and gastric varices and recurrent GI bleeding, had circulatory collapse associated with ECG changes suggestive of anterolateral ischemia, resolved after correction of hypotension and anemia using transfusion and pressors. Troponin increased to 33.  Assessment & Plan    1. CAD/NSTEMI: Troponins peaked at 33, down to 2.02 on 5/9. Echo with reduced LVEF and wall motion abnormalities. On ASA. Has been hypotensive. Plan for cath next week. Unable to add beta blockers or ACEI/ARB at this time nor statins (cirrhosis).  2. Cirrhosis with severe portal hypertension and bleeding esophageal varices s/p TIPS and sclerotherapy: Hgb 10.8 today. CT abd with resolution of gastric and esophageal varices. Still volume overloaded, on both Lasix and spironolactone. 570 cc output in last 24 hrs.  3. Shock:  No longer on pressors. On midodrine. SBP 90's. Noncardiac in etiology. Continue midodrine.  4. NSVT: K low normal. Will give 60 meq KCl today. Mg ok.    Signed, Kate Sable, MD  04/13/2017, 11:03 AM

## 2017-04-13 NOTE — Clinical Social Work Note (Signed)
CSW acknowledges consult "Please assist patient with advanced directives. He has them filled out and ready to be notarized. Thanks so much!" Please consult chaplain for this.  CSW signing off. Consult again if any social work needs arise.  Dayton Scrape, [place]

## 2017-04-13 NOTE — Progress Notes (Signed)
Internal Medicine Attending:   I saw and examined the patient. I reviewed the resident's note and I agree with the resident's findings and plan as documented in the resident's note.  Doing well today with symptoms. Active problems now include diffuse anasarca due to cirrhosis and volume resuscitation. We will continue with diuresis with IV Lasix. Remove foley once the patient can position himself better to use the urinal, still has a lot of scrotal swelling that puts him at risk for retention. I think we can wean off the midodrine soon. No signs of encephalopathy, so I think we can also stop the rifaximin.

## 2017-04-13 NOTE — Progress Notes (Signed)
Subjective: [name] was transferred from ICU to the telemetry floor this morning.Marland Kitchen He is ambulatory to the bathroom and around the nursing unit yesterday. He feels no shortness of breath at rest but feels much weaker than usual. He denies any new fever or chills overnight. He has a foley catheter in place and passed regular bowel movements overnight.  Objective:  Vital signs in last 24 hours: Vitals:   04/12/17 2220 04/13/17 0524 04/13/17 0930 04/13/17 1018  BP: (!) 111/51 (!) 95/43  (!) 98/44  Pulse: (!) 107 96  (!) 106  Resp: 18 18  (!) 148  Temp: 98.6 F (37 C) 98.3 F (36.8 C)  98.2 F (36.8 C)  TempSrc: Oral Oral  Oral  SpO2: 91% 93% 95% 99%  Weight: 231 lb 0.7 oz (104.8 kg)     Height:       GENERAL- Sleeping bu easily aroused and conversational, NAD HEENT- Dressing over recently removed RIJ site CARDIAC- RRR, no murmurs, rubs or gallops RESP- Bibasilar inspiratory crackles in supine position, normal WOB ABDOMEN- Soft, nontender, protuberant EXTREMITIES- Diffuse 2+ pitting edema in bilateral upper and lower extremities SKIN- Warm and dry, RLE chronic hyperpigmented skin changes on medial calf PSYCH- Normal mood and affect, appropriate thought content and speech.   Assessment/Plan: 66 yo man admitted with anemia due to active GI bleeding on 4/27 with hospital course complicated by severe hypotension after rebleeding at bowel prep and colonoscopy on 5/3 requiring pressor support and respiratory failure related to large volume fluid resuscitation. An actively bleeding gastric varix was identified on EGD and variceal bleeding controlled by BRTO and TIPS. He was extubated on 5/7 and IV neosynephrine discontinued on 5/10.  #Acute combined systolic and diastolic heart failure #NSTEMI 2/2 hypotension and acute blood loss anemia #NSVT He remains mildly hypotensive today on low dose midodrine but with warm perfused extremities. His baseline blood pressure was low to normal  previously so this is not actually a large decrease. We will continue to diurese cautiously with BID PO lasix and spironolactone for now. His 2.5mg  midodrine will be continued during this. Electrolytes to be supplemented as needed during diuresis. He needs ischemic workup this admission for CAD and possibly new ischemic cardiomyopathy and Cardiology is following to decide on timing for cath, possibly Monday vs another time next week.  #Anasarca #Pulmonary edema He is diffusely edematous in upper and lower extremities. This is probably more from third spacing and cirrhosis but also new heart failure component. We are diuresing as above. His oxygen requirement at rest is improved, although he required more supplementation with activity yesterday. He was encouraged to get out of bed multiple times today. We will continue diuresis with foley catheter in place over the weekend.  #Bleeding gastric varices #Colonic AVM Bleeding remains resolved since BRTO and TIPS with resolution of varices on 5/3.  #Liver cirrhosis with ascites #Portal hypertension s/p TIPS #Hepatic encephalopathy His mental status appears normal today currently on lactulose and rifaximin BID. We will need to monitor closely for hepatic encephalopathy with new TIPS but he may not require rifaximin as long term therapy.  #Thrombocytopenia 115 today. This is more consistent with his previous values so the 68 reported yesterday may be a somewhat spurious result.  #Hospital acquired pneumonia Antibiotics discontinued yesterday, with no residual cough, fever, or complaint. He will need eventual follow up CXR.  Dispo: Anticipated discharge pending clinical improvement and cardiac evaluation next week, approximately 6-10 day(s).   Collier Salina, MD  PGY-II Internal Medicine Resident Pager# [phone] 04/13/2017, 12:25 PM

## 2017-04-14 DIAGNOSIS — E876 Hypokalemia: Secondary | ICD-10-CM

## 2017-04-14 DIAGNOSIS — Z9889 Other specified postprocedural states: Secondary | ICD-10-CM

## 2017-04-14 LAB — BASIC METABOLIC PANEL
Anion gap: 8 (ref 5–15)
BUN: 10 mg/dL (ref 6–20)
CHLORIDE: 102 mmol/L (ref 101–111)
CO2: 24 mmol/L (ref 22–32)
Calcium: 7.6 mg/dL — ABNORMAL LOW (ref 8.9–10.3)
Creatinine, Ser: 0.91 mg/dL (ref 0.61–1.24)
GFR calc Af Amer: >60 mL/min (ref >60)
Glucose, Bld: 91 mg/dL (ref 65–99)
POTASSIUM: 3.8 mmol/L (ref 3.5–5.1)
SODIUM: 134 mmol/L — AB (ref 135–145)

## 2017-04-14 LAB — MAGNESIUM: MAGNESIUM: 1.9 mg/dL (ref 1.7–2.4)

## 2017-04-14 MED ORDER — HEPARIN SODIUM (PORCINE) 5000 UNIT/ML IJ SOLN
5000.0000 [IU] | Freq: Three times a day (TID) | INTRAMUSCULAR | Status: DC
  Administered 2017-04-14 – 2017-04-15 (×3): 5000 [IU] via SUBCUTANEOUS
  Filled 2017-04-14 (×3): qty 1

## 2017-04-14 MED ORDER — IPRATROPIUM-ALBUTEROL 0.5-2.5 (3) MG/3ML IN SOLN: 3.0000 mL | RESPIRATORY_TRACT | Status: DC | PRN

## 2017-04-14 MED ORDER — MAGNESIUM SULFATE 2 GM/50ML IV SOLN
2.0000 g | Freq: Once | INTRAVENOUS | Status: AC
  Administered 2017-04-14: 2 g via INTRAVENOUS
  Filled 2017-04-14: qty 50

## 2017-04-14 MED ORDER — POTASSIUM CHLORIDE 20 MEQ PO PACK
40.0000 meq | PACK | Freq: Once | ORAL | Status: AC
  Administered 2017-04-14: 40 meq via ORAL
  Filled 2017-04-14: qty 2

## 2017-04-14 NOTE — Progress Notes (Signed)
Internal Medicine Attending:   I saw and examined the patient. I reviewed the resident's note and I agree with the resident's findings and plan as documented in the resident's note.  66 year old man with prolonged hospitalization due to a variceal GI bleed which was managed with TIPS and BRTO ten days ago with no recurrent bleeding. We are still managing diffuse anasarca which is due to volume resuscitation, cirrhosis, and HFrEF. Continue with IV lasix, remove foley once there is less scrotal edema. Cardiology planning on [facility] tomorrow, hopefully he will be able to lie flat by then. Given prolonged stay and severe edema, he has high DVT risk and currently not anticoagulated. I think at this point, with decompression of portal pressures and treatment of the varices, we can start back prophylactic heparin.

## 2017-04-14 NOTE — Progress Notes (Signed)
Progress Note  Patient Name: John Warren Date of Encounter: 04/14/2017  Primary Cardiologist: Wynonia Lawman  Subjective   Denies chest pain and shortness of breath. Michela Pitcher he was hot earlier but "It was 80 degrees in the room".  Inpatient Medications    Scheduled Meds: . aspirin EC  81 mg Oral Daily  . budesonide (PULMICORT) nebulizer solution  0.5 mg Nebulization BID  . feeding supplement (ENSURE ENLIVE)  237 mL Oral BID BM  . furosemide  40 mg Intravenous Q12H  . ipratropium-albuterol  3 mL Nebulization TID  . lactulose  10 g Oral BID  . midodrine  2.5 mg Oral TID WC  . pantoprazole  40 mg Oral BID  . spironolactone  25 mg Oral Daily   Continuous Infusions:  PRN Meds: acetaminophen **OR** acetaminophen   Vital Signs    Vitals:   04/13/17 1018 04/13/17 1736 04/13/17 2202 04/14/17 0445  BP: (!) 98/44 (!) 120/55 (!) 96/39 (!) 97/45  Pulse: (!) 106 (!) 103 96 92  Resp: 18 20 16 18   Temp: 98.2 F (36.8 C) 98.2 F (36.8 C) 98.6 F (37 C) 97.5 F (36.4 C)  TempSrc: Oral Oral Oral Oral  SpO2: 99% 94% 94% 95%  Weight:   227 lb 1.2 oz (103 kg)   Height:        Intake/Output Summary (Last 24 hours) at 04/14/17 0938 Last data filed at 04/14/17 0700  Gross per 24 hour  Intake              560 ml  Output             6400 ml  Net            -5840 ml   Filed Weights   04/12/17 0500 04/12/17 2220 04/13/17 2202  Weight: 226 lb 10.1 oz (102.8 kg) 231 lb 0.7 oz (104.8 kg) 227 lb 1.2 oz (103 kg)    Telemetry    NSR - Personally Reviewed  ECG     - Personally Reviewed  Physical Exam   GEN:No acute distress.   Neck:No JVD Cardiac:RRR, 3/6 apical and left sternal border holosystolic murmur, 2/6 ejection systolic murmur over RUSB, no rubs or gallops.  Respiratory:Clear to auscultation bilaterally. PJ:KDTO, nontender mildly distended  MS:2+ pitting pretibial and dorsal pedal edema b/l Neuro:Nonfocal  Psych: Normal affect   Labs    Chemistry Recent  Labs Lab 04/11/17 0445 04/12/17 0408 04/13/17 0531 04/14/17 0448  NA 137 137 136 134*  K 3.2* 3.0* 3.6 3.8  CL 101 101 102 102  CO2 28 30 27 24   GLUCOSE 141* 121* 105* 91  BUN 14 13 11 10   CREATININE 0.89 0.95 0.98 0.91  CALCIUM 7.5* 7.4* 7.6* 7.6*  ALBUMIN 2.0* 1.8* 2.0*  --   GFRNONAA >60 >60 >60 >60  GFRAA >60 >60 >60 >60  ANIONGAP 8 6 7 8      Hematology Recent Labs Lab 04/11/17 0455 04/12/17 0408 04/13/17 0531  WBC 19.7* 8.6 11.2*  RBC 3.97* 3.52* 3.87*  HGB 11.1* 10.2* 10.8*  HCT 35.5* 32.1* 34.5*  MCV 89.4 91.2 89.1  MCH 28.0 29.0 27.9  MCHC 31.3 31.8 31.3  RDW 17.3* 17.5* 17.5*  PLT 149* 68* 115*    Cardiac Enzymes Recent Labs Lab 04/10/17 1126  TROPONINI 2.02*   No results for input(s): TROPIPOC in the last 168 hours.   BNPNo results for input(s): BNP, PROBNP in the last 168 hours.   DDimer No results for  input(s): DDIMER in the last 168 hours.   Radiology    No results found.  Cardiac Studies   Echocardiogram 04/05/17:  Study Conclusions  - Left ventricle: The cavity size was normal. Wall thickness was normal. Systolic function was mildly reduced. The estimated ejection fraction was in the range of 45% to 50%. Anterior, apical and inferoapical hypokinesis with a hyperdynamic base. The study is not technically sufficient to allow evaluation of LV diastolic function due to E/A fusion. Ejection fraction (MOD, 2-plane): 46%. - Aortic valve: Poorly visualized. Elevated gradients suggest at least moderate aortic stenosis. Valve area (VTI): 0.83 cm^2. Valve area (Vmax): 0.76 cm^2. Valve area (Vmean): 0.75 cm^2. - Mitral valve: Calcified annulus. Mildly thickened leaflets . Mild stenosis. There was trivial regurgitation. Valve area by pressure half-time: 2.42 cm^2. Valve area by continuity equation (using LVOT flow): 1.03 cm^2. - Left atrium: The atrium was mildly dilated. - Tricuspid valve: There was mild  regurgitation. - Pulmonary arteries: PA peak pressure: 50 mm Hg (S). - Inferior vena cava: The vessel was dilated. The respirophasic diameter changes were blunted (<50%), consistent with elevated central venous pressure.  Impressions:  - Compared to a prior study in 12/2015, the LVEF is reduced from 65-70% to 45-50% with anterior, apical and inferoapical hypokinesis and a hyperdynamic base. Findings could suggest LAD territory ischemia/infarct or Takatsubo cardiomyopathy. Elevated transvalvular gradients suggestive of at least moderate aortic valve stenosis.  Patient Profile     66 y.o. male with cirrhosis and gastric varices and recurrent GI bleeding, had circulatory collapse associated with ECG changes suggestive of anterolateral ischemia, resolved after correction of hypotension and anemia using transfusion and pressors. Troponin increased to 33.  Assessment & Plan    1. CAD/NSTEMI: Troponins peaked at 33, down to 2.02 on 5/9. Echo with reduced LVEF and wall motion abnormalities. On ASA. Has been hypotensive. Plan for cath this week. Unable to add beta blockers or ACEI/ARB at this time nor statins (cirrhosis).  2. Cirrhosis with severe portal hypertension and bleeding esophageal varices s/p TIPS and sclerotherapy: Hgb 10.8 yesterday. CT abd with resolution of gastric and esophageal varices. Still volume overloaded, on both Lasix and spironolactone. 1800 cc output in last 24 hrs.  3. Shock: No longer on pressors. On midodrine. SBP 90's. Noncardiac in etiology. Continue midodrine.  4. NSVT: K normal after 60 meq KCl given yesterday. Mg ok. No recurrences of NSVT overnight.   Signed, Kate Sable, MD  04/14/2017, 9:38 AM

## 2017-04-14 NOTE — Progress Notes (Signed)
   Subjective:  Patient states he feels well today; he is eager to get in his chair this morning and also to get moving around. He endorses some shortness of breath when he woke up which he attributed to being hot (was 90F in room after [facility] was turned on during my visit), which has resolved now. He denies chest pain.   Objective:  Vital signs in last 24 hours: Vitals:   04/13/17 1018 04/13/17 1736 04/13/17 2202 04/14/17 0445  BP: (!) 98/44 (!) 120/55 (!) 96/39 (!) 97/45  Pulse: (!) 106 (!) 103 96 92  Resp: 18 20 16 18   Temp: 98.2 F (36.8 C) 98.2 F (36.8 C) 98.6 F (37 C) 97.5 F (36.4 C)  TempSrc: Oral Oral Oral Oral  SpO2: 99% 94% 94% 95%  Weight:   103 kg (227 lb 1.2 oz)   Height:       Constitutional: NAD, lying in bed comfortably CV: RRR, no murmurs, rubs or gallops appreciated, pulses intact throughout. 3+ bil LE edema, 2+ UE edema, warm extremities Resp: bibasilar crackles, no increased work of breathing, no wheezing Abd: soft, NDNT, +BS  Assessment/Plan:  Principal Problem:   Acute combined systolic and diastolic heart failure (HCC) Active Problems:   AVM (arteriovenous malformation) of colon   Sigmoid diverticulosis   Hypokalemia   Thrombocytopenia (HCC)   Cirrhosis of liver with ascites (HCC)   Non-ST elevation (NSTEMI) myocardial infarction (HCC)   Hypervolemia   Coronary artery disease   NSVT (nonsustained ventricular tachycardia) (HCC)  Anasarca  Pulmonary edema: Patient with cirrhosis and new acute combined heart failure in setting of large volume repletion now with overt volume overload. Patient continues to diurese well (-5.7L yesterday), but still has quite a ways to go. He is currently on 3L [place] - we will work on titrating him off of oxygen as able.  --lasix 40mg  IV BID --follow bmets and replace electrolytes as necessary --continue encouraging activity as tolerated --supplemental oxygen as needed --daily weights, ins/outs  NSTEMI 2/2 HypoTN and  acute blood loss anemia  Acute combined congestive heart failure: Cardiology following and will plan on performing cardiac cath this admission.  --diuresis as above --replete electrolytes as necessary --continue midodrine, ASA  Cirrhosis Portal HTN, s/p TIPS: --continue lactulose, pantoprazole  Dispo: Anticipated discharge in approximately 7-10 days day(s).   Alphonzo Grieve, MD 04/14/2017, 7:57 AM Pager [phone]

## 2017-04-15 ENCOUNTER — Inpatient Hospital Stay (HOSPITAL_COMMUNITY)
Admission: EM | Disposition: A | Discharge status: Home / Self Care | Payer: Self-pay | Source: Home / Self Care | Attending: Pulmonary Disease

## 2017-04-15 DIAGNOSIS — I2510 Atherosclerotic heart disease of native coronary artery without angina pectoris: Secondary | ICD-10-CM

## 2017-04-15 DIAGNOSIS — Z960 Presence of urogenital implants: Secondary | ICD-10-CM

## 2017-04-15 HISTORY — PX: RIGHT/LEFT HEART CATH AND CORONARY ANGIOGRAPHY: CATH118266

## 2017-04-15 LAB — BASIC METABOLIC PANEL
ANION GAP: 11 (ref 5–15)
BUN: 11 mg/dL (ref 6–20)
CALCIUM: 7.8 mg/dL — AB (ref 8.9–10.3)
CHLORIDE: 102 mmol/L (ref 101–111)
CO2: 24 mmol/L (ref 22–32)
CREATININE: 0.99 mg/dL (ref 0.61–1.24)
GFR calc Af Amer: >60 mL/min (ref >60)
GFR calc non Af Amer: >60 mL/min (ref >60)
GLUCOSE: 96 mg/dL (ref 65–99)
Potassium: 4.1 mmol/L (ref 3.5–5.1)
Sodium: 137 mmol/L (ref 135–145)

## 2017-04-15 LAB — CBC
HCT: 38.1 % — ABNORMAL LOW (ref 39.0–52.0)
HEMOGLOBIN: 12 g/dL — AB (ref 13.0–17.0)
MCH: 28.3 pg (ref 26.0–34.0)
MCHC: 31.5 g/dL (ref 30.0–36.0)
MCV: 89.9 fL (ref 78.0–100.0)
Platelets: 141 10*3/uL — ABNORMAL LOW (ref 150–400)
RBC: 4.24 MIL/uL (ref 4.22–5.81)
RDW: 18.2 % — ABNORMAL HIGH (ref 11.5–15.5)
WBC: 11.5 10*3/uL — ABNORMAL HIGH (ref 4.0–10.5)

## 2017-04-15 LAB — POCT I-STAT 3, ART BLOOD GAS (G3+)
ACID-BASE EXCESS: 1 mmol/L (ref 0.0–2.0)
BICARBONATE: 22.3 mmol/L (ref 20.0–28.0)
O2 Saturation: 95 %
PO2 ART: 65 mmHg — AB (ref 83.0–108.0)
TCO2: 23 mmol/L (ref 0–100)
pCO2 arterial: 26.4 mmHg — ABNORMAL LOW (ref 32.0–48.0)
pH, Arterial: 7.534 — ABNORMAL HIGH (ref 7.350–7.450)

## 2017-04-15 LAB — MAGNESIUM: MAGNESIUM: 2 mg/dL (ref 1.7–2.4)

## 2017-04-15 LAB — GLUCOSE, CAPILLARY: Glucose-Capillary: 122 mg/dL — ABNORMAL HIGH (ref 65–99)

## 2017-04-15 LAB — POCT I-STAT 3, VENOUS BLOOD GAS (G3P V)
Acid-Base Excess: 1 mmol/L (ref 0.0–2.0)
BICARBONATE: 23.2 mmol/L (ref 20.0–28.0)
O2 SAT: 76 %
TCO2: 24 mmol/L (ref 0–100)
pCO2, Ven: 29 mmHg — ABNORMAL LOW (ref 44.0–60.0)
pH, Ven: 7.511 — ABNORMAL HIGH (ref 7.250–7.430)
pO2, Ven: 36 mmHg (ref 32.0–45.0)

## 2017-04-15 LAB — POCT ACTIVATED CLOTTING TIME: Activated Clotting Time: 164 seconds

## 2017-04-15 SURGERY — RIGHT/LEFT HEART CATH AND CORONARY ANGIOGRAPHY
Anesthesia: LOCAL

## 2017-04-15 MED ORDER — SODIUM CHLORIDE 0.9% FLUSH
3.0000 mL | Freq: Two times a day (BID) | INTRAVENOUS | Status: DC
  Administered 2017-04-16 – 2017-04-17 (×3): 3 mL via INTRAVENOUS

## 2017-04-15 MED ORDER — VERAPAMIL HCL 2.5 MG/ML IV SOLN
INTRAVENOUS | Status: DC | PRN
  Administered 2017-04-15: 10 mL via INTRA_ARTERIAL

## 2017-04-15 MED ORDER — HEPARIN SODIUM (PORCINE) 5000 UNIT/ML IJ SOLN
5000.0000 [IU] | Freq: Three times a day (TID) | INTRAMUSCULAR | Status: DC
  Administered 2017-04-15 – 2017-04-17 (×5): 5000 [IU] via SUBCUTANEOUS
  Filled 2017-04-15 (×6): qty 1

## 2017-04-15 MED ORDER — SODIUM CHLORIDE 0.9% FLUSH: 3.0000 mL | INTRAVENOUS | Status: DC | PRN

## 2017-04-15 MED ORDER — HEPARIN (PORCINE) IN NACL 2-0.9 UNIT/ML-% IJ SOLN
INTRAMUSCULAR | Status: AC
  Filled 2017-04-15: qty 1000

## 2017-04-15 MED ORDER — IOPAMIDOL (ISOVUE-370) INJECTION 76%
INTRAVENOUS | Status: AC
  Filled 2017-04-15: qty 100

## 2017-04-15 MED ORDER — HEPARIN SODIUM (PORCINE) 1000 UNIT/ML IJ SOLN
INTRAMUSCULAR | Status: AC
  Filled 2017-04-15: qty 1

## 2017-04-15 MED ORDER — IOPAMIDOL (ISOVUE-370) INJECTION 76%
INTRAVENOUS | Status: DC | PRN
  Administered 2017-04-15: 80 mL via INTRA_ARTERIAL

## 2017-04-15 MED ORDER — SODIUM CHLORIDE 0.9 % WEIGHT BASED INFUSION: 1.0000 mL/kg/h | INTRAVENOUS | Status: AC

## 2017-04-15 MED ORDER — LIDOCAINE HCL 1 % IJ SOLN
INTRAMUSCULAR | Status: AC
  Filled 2017-04-15: qty 20

## 2017-04-15 MED ORDER — HEPARIN SODIUM (PORCINE) 1000 UNIT/ML IJ SOLN
INTRAMUSCULAR | Status: DC | PRN
  Administered 2017-04-15: 4000 [IU] via INTRAVENOUS

## 2017-04-15 MED ORDER — HEPARIN (PORCINE) IN NACL 2-0.9 UNIT/ML-% IJ SOLN
INTRAMUSCULAR | Status: AC | PRN
  Administered 2017-04-15: 1000 mL

## 2017-04-15 MED ORDER — VERAPAMIL HCL 2.5 MG/ML IV SOLN
INTRAVENOUS | Status: AC
  Filled 2017-04-15: qty 2

## 2017-04-15 MED ORDER — ASPIRIN 81 MG PO CHEW: 81.0000 mg | CHEWABLE_TABLET | ORAL | Status: DC

## 2017-04-15 MED ORDER — SODIUM CHLORIDE 0.9 % IV SOLN: 250.0000 mL | INTRAVENOUS | Status: DC | PRN

## 2017-04-15 MED ORDER — LIDOCAINE HCL (PF) 1 % IJ SOLN
INTRAMUSCULAR | Status: DC | PRN
  Administered 2017-04-15: 5 mL via INTRADERMAL
  Administered 2017-04-15: 5 mL

## 2017-04-15 SURGICAL SUPPLY — 14 items
CATH 5FR JL3.5 JR4 ANG PIG MP (CATHETERS) ×2 IMPLANT
CATH SWAN GANZ 7F STRAIGHT (CATHETERS) ×2 IMPLANT
COVER PRB 48X5XTLSCP FOLD TPE (BAG) ×1 IMPLANT
COVER PROBE 5X48 (BAG) ×1
DEVICE RAD COMP TR BAND LRG (VASCULAR PRODUCTS) ×2 IMPLANT
GLIDESHEATH SLEND SS 6F .021 (SHEATH) ×2 IMPLANT
GUIDEWIRE INQWIRE 1.5J.035X260 (WIRE) ×1 IMPLANT
INQWIRE 1.5J .035X260CM (WIRE) ×2
KIT HEART LEFT (KITS) ×2 IMPLANT
PACK CARDIAC CATHETERIZATION (CUSTOM PROCEDURE TRAY) ×2 IMPLANT
SHEATH PINNACLE 7F 10CM (SHEATH) ×2 IMPLANT
SYR MEDRAD MARK V 150ML (SYRINGE) ×2 IMPLANT
TRANSDUCER W/STOPCOCK (MISCELLANEOUS) ×2 IMPLANT
TUBING CIL FLEX 10 FLL-RA (TUBING) ×2 IMPLANT

## 2017-04-15 NOTE — Progress Notes (Signed)
   Subjective:  Patient states he has not had more exacerbations of shortness of breath since yesterday morning. He denies chest pain; he endorses leg discomfort in setting of his edema. He has sat up in his chair yesterday but has not been able to ambulate with nursing yesterday. He does not feel ready to have his foley catheter out yet due to the still heavy diuresis.  Objective:  Vital signs in last 24 hours: Vitals:   04/15/17 0448 04/15/17 0800 04/15/17 0858 04/15/17 0937  BP: (!) 110/49 (!) 97/43  (!) 103/45  Pulse: 97 97  97  Resp: 18 16  18   Temp: 98.2 F (36.8 C) 98.4 F (36.9 C)  98.3 F (36.8 C)  TempSrc: Oral Oral  Oral  SpO2: 95% 98% 95% 97%  Weight:      Height:       Constitutional: NAD, lying in bed comfortably CV: RRR, no murmurs, rubs or gallops appreciated, pulses intact throughout. 3+ bil LE edema, nonpitting UE edema, warm extremities Resp: minimal bibasilar crackles, no increased work of breathing, no wheezing Abd: soft, NDNT, +BS  Assessment/Plan:  Principal Problem:   Acute combined systolic and diastolic heart failure (HCC) Active Problems:   AVM (arteriovenous malformation) of colon   Sigmoid diverticulosis   Hypokalemia   Thrombocytopenia (HCC)   Cirrhosis of liver with ascites (HCC)   Non-ST elevation (NSTEMI) myocardial infarction (HCC)   Hypervolemia   Coronary artery disease   NSVT (nonsustained ventricular tachycardia) (HCC)  Anasarca  Pulmonary edema: Patient with cirrhosis and new acute combined heart failure in setting of large volume repletion now with overt volume overload. Patient continues to diurese well, but still has quite a ways to go. He is currently on 3L [place] - we will work on titrating him off of oxygen as able.  --lasix 40mg  IV BID --continue spironolactone 25mg  daily --follow bmets and replace electrolytes as necessary --continue encouraging activity as tolerated --supplemental oxygen as needed --daily weights,  ins/outs --ambulate qshift  NSTEMI 2/2 HypoTN and acute blood loss anemia  Acute combined congestive heart failure: Cardiology following and will plan on performing cardiac cath today. --diuresis as above --replete electrolytes as necessary --ASA --BP has been stable; will d/c midodrine and continue monitoring  Cirrhosis Portal HTN, s/p TIPS: --continue lactulose, pantoprazole   Diet: soft VTE ppx: heparin sq Code: FULL  Dispo: Anticipated discharge in approximately 6-7 days day(s).   Alphonzo Grieve, MD 04/15/2017, 10:29 AM Pager [phone]

## 2017-04-15 NOTE — Progress Notes (Signed)
Internal Medicine Attending:   I saw and examined the patient. I reviewed the resident's note and I agree with the resident's findings and plan as documented in the resident's note.  Doing well today. No weight down five more pounds as of last night, he is responding nicely to IV lasix which we will continue for anasarca. Will remove foley once scrotal swelling improves. He can lay flat now, so can go for [facility] when able.

## 2017-04-15 NOTE — H&P (View-Only) (Signed)
Subjective:  I was just notified this morning that this patient has been in the hospital.  He has been seen by [facility] cardiology for the past week.  Including this weekend.  Chart reviewed and note was read from yesterday.  He is on the cath schedule for today.  He has a previous suspected anterior infarction and does need catheterization.  He is anxious to go home.  No recurrent chest pain.  Objective:  Vital Signs in the last 24 hours: BP (!) 103/45 (BP Location: Left Arm)   Pulse 97   Temp 98.3 F (36.8 C) (Oral)   Resp 18   Ht 5\' 11"  (1.803 m)   Wt 100.7 kg (222 lb 1.6 oz)   SpO2 97%   BMI 30.98 kg/m   Physical Exam: Pleasant male in no acute distress Lungs:  Clear Cardiac:  Regular rhythm, normal S1 and S2, no S3 Abdomen:  Soft, nontender, no masses Extremities:  No edema present  Intake/Output from previous day: 05/13 0701 - 05/14 0700 In: 600 [P.O.:600] Out: 3850 [Urine:3850]  Weight Filed Weights   04/12/17 2220 04/13/17 2202 04/14/17 2104  Weight: 104.8 kg (231 lb 0.7 oz) 103 kg (227 lb 1.2 oz) 100.7 kg (222 lb 1.6 oz)    Lab Results: Basic Metabolic Panel:  Recent Labs  04/14/17 0448 04/15/17 0724  NA 134* 137  K 3.8 4.1  CL 102 102  CO2 24 24  GLUCOSE 91 96  BUN 10 11  CREATININE 0.91 0.99   CBC:  Recent Labs  04/13/17 0531 04/15/17 0724  WBC 11.2* 11.5*  NEUTROABS 8.6*  --   HGB 10.8* 12.0*  HCT 34.5* 38.1*  MCV 89.1 89.9  PLT 115* 141*    Assessment/Plan:  1.  CAD/non-STEMI with some reduction in LV   he will not be a candidate for coronary stenting because he is not to be able to take antiplatelet agents.  I'm in favor of proceeding with catheterization today to define his coronary anatomy.  If he has critical anatomy will need to decide what to do at that time but don't really think he is a candidate for stenting.  Cardiac catheterization was discussed with the patient fully including risks of myocardial infarction, death, stroke,  bleeding, arrhythmia, dye allergy, renal insufficiency or bleeding.  The patient understands and is willing to proceed.  Possibility of intervention at the same time also discussed with patient and they understand and are agreeable to proceed      W. Doristine Church  MD [facility]  04/15/2017, 1:43 PM

## 2017-04-15 NOTE — Progress Notes (Signed)
TR BAND REMOVAL  LOCATION:    right radial  DEFLATED PER PROTOCOL:    Yes.    TIME BAND OFF / DRESSING APPLIED:    1830   SITE UPON ARRIVAL:    Level 0  SITE AFTER BAND REMOVAL:    Level 0  CIRCULATION SENSATION AND MOVEMENT:    Within Normal Limits   Yes.    COMMENTS:   1900 no change

## 2017-04-15 NOTE — Interval H&P Note (Signed)
History and Physical Interval Note:  04/15/2017 2:34 PM  John Warren  has presented today for surgery, with the diagnosis of cp  The various methods of treatment have been discussed with the patient and family. After consideration of risks, benefits and other options for treatment, the patient has consented to  Procedure(s): Right/Left Heart Cath and Coronary Angiography (N/A) as a surgical intervention .  The patient's history has been reviewed, patient examined, no change in status, stable for surgery.  I have reviewed the patient's chart and labs.  Questions were answered to the patient's satisfaction.     Collier Salina [facility] 04/15/2017 2:34 PM

## 2017-04-15 NOTE — Progress Notes (Addendum)
Physical Therapy Treatment Patient Details Name: John Warren MRN: [id number] DOB: [birth year] Today's Date: 04/15/2017    History of Present Illness  66 yo admitted with recurrent GIB s/p S/P TIPS & BATO (Balloon-occluded Antegrade Transvenous Obliteration of Gastric Varices 5/3, ETT 5/3-5/7, with hypotension. PMHx: cirrhosis, GIB, CHF    PT Comments    Continuing work on functional mobility and activity tolerance;  Good progress evidenced by need for less supplemental O2 (3L this session compared to 6L last session); very motivated to get bettera nd back to independent walking and mobility; noted for Cardiac Catheterization later today   Follow Up Recommendations  Home health PT;Supervision/Assistance - 24 hour     Equipment Recommendations  Rolling walker with 5" wheels;3in1 (PT)    Recommendations for Other Services OT consult     Precautions / Restrictions Precautions Precautions: Fall Precaution Comments: watch sats Restrictions Weight Bearing Restrictions: No    Mobility  Bed Mobility Overal bed mobility: Needs Assistance Bed Mobility: Supine to Sit     Supine to sit: Supervision;HOB elevated     General bed mobility comments: increased time with use of rail and HOB 25degrees  Transfers Overall transfer level: Needs assistance Equipment used: Rolling walker (2 wheeled) Transfers: Sit to/from Stand Sit to Stand: Min guard         General transfer comment: cues for hand placement and safety; elevated bed to approximate his high bed at home  Ambulation/Gait Ambulation/Gait assistance: Min guard Ambulation Distance (Feet): 120 Feet Assistive device: Rolling walker (2 wheeled) Gait Pattern/deviations: Step-through pattern;Trunk flexed;Decreased stride length   Gait velocity interpretation: Below normal speed for age/gender General Gait Details: RW height adjusted for optimal fit; Cues for posture and scapular retraction; walked on 3L O2 via [place] and O2 sats  remained greater than or equal to 90%; Highest HR observed was 138 bpm; 3 standing rest breaks   Stairs            Wheelchair Mobility    Modified Rankin (Stroke Patients Only)       Balance     Sitting balance-John Warren Scale: Good       Standing balance-John Warren Scale: Fair                              Cognition Arousal/Alertness: Awake/alert Behavior During Therapy: WFL for tasks assessed/performed Overall Cognitive Status: Within Functional Limits for tasks assessed                                        Exercises      General Comments        Pertinent Vitals/Pain Pain Assessment: Faces Faces Pain Scale: Hurts a little bit Pain Location: ankles; grimacing likely more related to effort as well Pain Descriptors / Indicators: Grimacing Pain Intervention(s): Monitored during session    Home Living                      Prior Function            PT Goals (current goals can now be found in the care plan section) Acute Rehab PT Goals Patient Stated Goal: return home and play with 3yo grandson PT Goal Formulation: With patient/family Time For Goal Achievement: 04/25/17 Potential to Achieve Goals: Good Progress towards PT goals: Progressing toward goals    Frequency  Min 3X/week      PT Plan Current plan remains appropriate    Co-evaluation              AM-PAC PT "6 Clicks" Daily Activity  Outcome Measure  Difficulty turning over in bed (including adjusting bedclothes, sheets and blankets)?: A Lot Difficulty moving from lying on back to sitting on the side of the bed? : A Lot Difficulty sitting down on and standing up from a chair with arms (e.g., wheelchair, bedside commode, etc,.)?: A Little Help needed moving to and from a bed to chair (including a wheelchair)?: A Little Help needed walking in hospital room?: A Little Help needed climbing 3-5 steps with a railing? : A Little 6 Click Score: 16     End of Session Equipment Utilized During Treatment: Gait belt;Oxygen Activity Tolerance: Patient tolerated treatment well Patient left: in bed;with call bell/phone within reach;with family/visitor present Nurse Communication: Mobility status PT Visit Diagnosis: Difficulty in walking, not elsewhere classified (R26.2)     Time: 5465-6812 PT Time Calculation (min) (ACUTE ONLY): 33 min  Charges:  $Gait Training: 23-37 mins                    G Codes:       [name], [name] Pager [phone] Office [phone]    [name] 04/15/2017, 1:40 PM

## 2017-04-15 NOTE — Progress Notes (Signed)
Subjective:  I was just notified this morning that this patient has been in the hospital.  He has been seen by [facility] cardiology for the past week.  Including this weekend.  Chart reviewed and note was read from yesterday.  He is on the cath schedule for today.  He has a previous suspected anterior infarction and does need catheterization.  He is anxious to go home.  No recurrent chest pain.  Objective:  Vital Signs in the last 24 hours: BP (!) 103/45 (BP Location: Left Arm)   Pulse 97   Temp 98.3 F (36.8 C) (Oral)   Resp 18   Ht 5\' 11"  (1.803 m)   Wt 100.7 kg (222 lb 1.6 oz)   SpO2 97%   BMI 30.98 kg/m   Physical Exam: Pleasant male in no acute distress Lungs:  Clear Cardiac:  Regular rhythm, normal S1 and S2, no S3 Abdomen:  Soft, nontender, no masses Extremities:  No edema present  Intake/Output from previous day: 05/13 0701 - 05/14 0700 In: 600 [P.O.:600] Out: 3850 [Urine:3850]  Weight Filed Weights   04/12/17 2220 04/13/17 2202 04/14/17 2104  Weight: 104.8 kg (231 lb 0.7 oz) 103 kg (227 lb 1.2 oz) 100.7 kg (222 lb 1.6 oz)    Lab Results: Basic Metabolic Panel:  Recent Labs  04/14/17 0448 04/15/17 0724  NA 134* 137  K 3.8 4.1  CL 102 102  CO2 24 24  GLUCOSE 91 96  BUN 10 11  CREATININE 0.91 0.99   CBC:  Recent Labs  04/13/17 0531 04/15/17 0724  WBC 11.2* 11.5*  NEUTROABS 8.6*  --   HGB 10.8* 12.0*  HCT 34.5* 38.1*  MCV 89.1 89.9  PLT 115* 141*    Assessment/Plan:  1.  CAD/non-STEMI with some reduction in LV   he will not be a candidate for coronary stenting because he is not to be able to take antiplatelet agents.  I'm in favor of proceeding with catheterization today to define his coronary anatomy.  If he has critical anatomy will need to decide what to do at that time but don't really think he is a candidate for stenting.  Cardiac catheterization was discussed with the patient fully including risks of myocardial infarction, death, stroke,  bleeding, arrhythmia, dye allergy, renal insufficiency or bleeding.  The patient understands and is willing to proceed.  Possibility of intervention at the same time also discussed with patient and they understand and are agreeable to proceed      W. Doristine Church  MD [facility] Cardiology  04/15/2017, 1:43 PM

## 2017-04-15 NOTE — Care Management Important Message (Signed)
Important Message  Patient Details  Name: KYSER WANDEL MRN: [id number] Date of Birth: 1951-09-27   Medicare Important Message Given:  Yes    Yaeli Hartung 04/15/2017, 1:50 PM

## 2017-04-16 ENCOUNTER — Encounter (HOSPITAL_COMMUNITY): Payer: Self-pay | Admitting: Cardiology

## 2017-04-16 LAB — BASIC METABOLIC PANEL
ANION GAP: 7 (ref 5–15)
BUN: 11 mg/dL (ref 6–20)
CALCIUM: 7.7 mg/dL — AB (ref 8.9–10.3)
CO2: 22 mmol/L (ref 22–32)
CREATININE: 0.86 mg/dL (ref 0.61–1.24)
Chloride: 106 mmol/L (ref 101–111)
GFR calc Af Amer: >60 mL/min (ref >60)
GLUCOSE: 125 mg/dL — AB (ref 65–99)
Potassium: 3.9 mmol/L (ref 3.5–5.1)
Sodium: 135 mmol/L (ref 135–145)

## 2017-04-16 LAB — CBC
HCT: 33.5 % — ABNORMAL LOW (ref 39.0–52.0)
Hemoglobin: 10.8 g/dL — ABNORMAL LOW (ref 13.0–17.0)
MCH: 28.6 pg (ref 26.0–34.0)
MCHC: 32.2 g/dL (ref 30.0–36.0)
MCV: 88.9 fL (ref 78.0–100.0)
PLATELETS: 166 10*3/uL (ref 150–400)
RBC: 3.77 MIL/uL — ABNORMAL LOW (ref 4.22–5.81)
RDW: 18.1 % — AB (ref 11.5–15.5)
WBC: 10.4 10*3/uL (ref 4.0–10.5)

## 2017-04-16 LAB — MAGNESIUM: MAGNESIUM: 2.1 mg/dL (ref 1.7–2.4)

## 2017-04-16 MED ORDER — ATENOLOL 25 MG PO TABS
12.5000 mg | ORAL_TABLET | Freq: Two times a day (BID) | ORAL | Status: DC
  Administered 2017-04-16 (×2): 12.5 mg via ORAL
  Filled 2017-04-16 (×4): qty 1

## 2017-04-16 MED ORDER — FUROSEMIDE 80 MG PO TABS
80.0000 mg | ORAL_TABLET | Freq: Every day | ORAL | Status: DC
  Administered 2017-04-16 – 2017-04-17 (×2): 80 mg via ORAL
  Filled 2017-04-16 (×2): qty 1

## 2017-04-16 MED FILL — Lidocaine HCl Local Inj 1%: INTRAMUSCULAR | Qty: 20 | Status: AC

## 2017-04-16 NOTE — Progress Notes (Addendum)
Subjective:  Patient feels better this morning but is still weak and unsteady on his feet and wants to walk.  Catheterization yesterday showed moderate three-vessel coronary artery disease that is best treated medically..  Objective:  Vital Signs in the last 24 hours: BP (!) 105/50 (BP Location: Left Arm)   Pulse (!) 102   Temp 98.1 F (36.7 C) (Oral)   Resp (!) 23   Ht 5\' 11"  (1.803 m)   Wt 97.7 kg (215 lb 6.2 oz)   SpO2 98%   BMI 30.04 kg/m   Physical Exam: Pleasant male in no acute distress Lungs:  Clear Cardiac:  Regular rhythm, normal S1 and S2, no S3, 2/6 systolic murmur across precordium Extremities:  2+  edema present  Intake/Output from previous day: 05/14 0701 - 05/15 0700 In: 1446.6 [P.O.:1220; I.V.:226.6] Out: 4100 [Urine:4100]  Weight Filed Weights   04/13/17 2202 04/14/17 2104 04/16/17 0400  Weight: 103 kg (227 lb 1.2 oz) 100.7 kg (222 lb 1.6 oz) 97.7 kg (215 lb 6.2 oz)    Lab Results: Basic Metabolic Panel:  Recent Labs  04/15/17 0724 04/16/17 0131  NA 137 135  K 4.1 3.9  CL 102 106  CO2 24 22  GLUCOSE 96 125*  BUN 11 11  CREATININE 0.99 0.86   CBC:  Recent Labs  04/15/17 0724 04/16/17 0131  WBC 11.5* 10.4  HGB 12.0* 10.8*  HCT 38.1* 33.5*  MCV 89.9 88.9  PLT 141* 166    Assessment/Plan:  1.  Three-vessel coronary artery disease best treated medically 2.  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  Non-STEMI likely due to the stress of blood loss and bleeding  Recommendations:  If okay with GI would like to place on atenolol for his CAD.  He needs physical therapy and cardiac rehabilitation to work with him.  He is not a candidate for anything other than aspirin because of his GI bleeding and if further bleeding could stay off of aspirin.  Once he is stable on his feet okay to go home from a cardiovascular viewpoint.      Kerry Hough  MD [facility] Cardiology  04/16/2017, 9:27 AM

## 2017-04-16 NOTE — Progress Notes (Signed)
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 cardiac Cath on 5/14; PMHx: cirrhosis, GIB, CHF    PT Comments    Continuing work on functional mobility and activity tolerance; Walked hallway on [employer] and O2 sats remained greater than or equal to 90% - excellent progress, no need for home O2;  While I favor HHPT follow up, it looks that that is not an option; on tranck for dc hoem tomorrow from a functional mobility standpoint  Follow Up Recommendations  Home health PT;Supervision/Assistance - 24 hour     Equipment Recommendations  Rolling walker with 5" wheels;3in1 (PT)    Recommendations for Other Services       Precautions / Restrictions Precautions Precautions: Fall Precaution Comments: watch sats Restrictions Weight Bearing Restrictions: No    Mobility  Bed Mobility Overal bed mobility: Needs Assistance Bed Mobility: Supine to Sit     Supine to sit: Supervision (HOB flat)     General bed mobility comments: Supervision for safety  Transfers Overall transfer level: Needs assistance Equipment used: Rolling walker (2 wheeled) Transfers: Sit to/from Stand Sit to Stand: Supervision         General transfer comment: cues for hand placement and safety; elevated bed to approximate his high bed at home  Ambulation/Gait Ambulation/Gait assistance: Min guard Ambulation Distance (Feet): 200 Feet Assistive device: Rolling walker (2 wheeled) Gait Pattern/deviations: Step-through pattern;Trunk flexed;Decreased stride length   Gait velocity interpretation: Below normal speed for age/gender General Gait Details: Cues to self-monitor for activity tolerance; O2  sats remained greater tahn or equal to 92% with amb on Room Air   Stairs Stairs: Yes   Stair Management: One rail Right;Forwards;Step to pattern Number of Stairs: 3 General stair comments: Cues for technqiue; overall managed stairs well  Wheelchair Mobility    Modified Rankin (Stroke Patients Only)       Balance     Sitting balance-Leahy Scale: Good       Standing balance-Leahy Scale:  (approaching Good)                              Cognition Arousal/Alertness: Awake/alert Behavior During Therapy: WFL for tasks assessed/performed Overall Cognitive Status: Within Functional Limits for tasks assessed                                        Exercises      General Comments        Pertinent Vitals/Pain Pain Assessment: No/denies pain Pain Intervention(s): Monitored during session    Home Living                      Prior Function            PT Goals (current goals can now be found in the care plan section) Acute Rehab PT Goals Patient Stated Goal: return home and play with 3yo grandson PT Goal Formulation: With patient/family Time For Goal Achievement: 04/25/17 Potential to Achieve Goals: Good Progress towards PT goals: Progressing toward goals    Frequency    Min 3X/week  PT Plan Current plan remains appropriate    Co-evaluation              AM-PAC PT "6 Clicks" Daily Activity  Outcome Measure  Difficulty turning over in bed (including adjusting bedclothes, sheets and blankets)?: A Little Difficulty moving from lying on back to sitting on the side of the bed? : A Little Difficulty sitting down on and standing up from a chair with arms (e.g., wheelchair, bedside commode, etc,.)?: A Little Help needed moving to and from a bed to chair (including a wheelchair)?: A Little Help needed walking in hospital room?: None Help needed climbing 3-5 steps with a railing? : A Little 6 Click Score: 19    End  of Session Equipment Utilized During Treatment: Gait belt Activity Tolerance: Patient tolerated treatment well Patient left: in chair;with call bell/phone within reach;with family/visitor present Nurse Communication: Mobility status PT Visit Diagnosis: Difficulty in walking, not elsewhere classified (R26.2)     Time: 1050-1120 PT Time Calculation (min) (ACUTE ONLY): 30 min  Charges:  $Gait Training: 23-37 mins                    G Codes:       [name], PT  [place] Pager [phone] Office [place] 04/16/2017, 4:32 PM

## 2017-04-16 NOTE — Progress Notes (Signed)
Internal Medicine Attending:   I saw and examined the patient. I reviewed the resident's note and I agree with the resident's findings and plan as documented in the resident's note.  66 year old man with cirrhosis here with a prolonged hospitalization for a life-threatening variceal bleed which has been managed with TIPS and BRTO. He had hypovolemic shock due to the bleed complicated by an NSTEMI, this was investigated yesterday with LHC which showed non-obstructive CAD amenable to medical management alone. I think aspirin and atenolol are fine given that his varices are now decompressed. His anasarca is improving with diuresis and will need continued lasix. Cirrhosis is stable with no encephalopathy, normal platelet count, continue on lactulose.

## 2017-04-16 NOTE — Care Management Note (Addendum)
Case Management Note  Patient Details  Name: WILLIAMSON CAVANAH MRN: [id number] Date of Birth: Apr 27, 1951  Subjective/Objective:                    Action/Plan: Patient's PCP is Dr Arelia Sneddon, [place], in the past Dr Arelia Sneddon will not sign home health orders , therefore home health agencies will not accept referral.  Explained above to patient. He has a 140 pound dog and does not feel that he needs home health at present anyway , he  does want walker and 3 in 1 .  Patient does not have home oxygen . Patient stated he ambulated on room air this am and saturation was 90%.  Qualifying oxygen saturation is 88% or below.   Will need saturation note documented if home oxygen needed  Expected Discharge Date:                  Expected Discharge Plan:  Home/Self Care  In-House Referral:     Discharge planning Services  CM Consult  Post Acute Care Choice:    Choice offered to:     DME Arranged:   walker , 3 in 1  DME Agency:   [facility]  HH Arranged:    [place] Agency:     Status of Service:  In process, will continue to follow  If discussed at Long Length of Stay Meetings, dates discussed:    Additional Comments:  Marilu Favre, RN 04/16/2017, 2:12 PM

## 2017-04-16 NOTE — Progress Notes (Signed)
   Subjective:  Patient states he is eager to get more activity and progress towards discharge. He still experiences some shortness of breath with exertion but is comfortable at rest. He denies chest pain.   Objective:  Vital signs in last 24 hours: Vitals:   04/16/17 0200 04/16/17 0400 04/16/17 0600 04/16/17 0815  BP: (!) 108/42 (!) 107/44 (!) 105/44 (!) 105/50  Pulse: 95 89 98 (!) 102  Resp: 15 16 15  (!) 23  Temp:  98.1 F (36.7 C) 98.1 F (36.7 C) 98.1 F (36.7 C)  TempSrc:  Oral Oral Oral  SpO2: 98% 95% 93% 98%  Weight:  97.7 kg (215 lb 6.2 oz)    Height:       Constitutional: NAD, lying in bed comfortably CV: RRR, no murmurs, rubs or gallops appreciated, pulses intact throughout. 3+ bil LE edema, nonpitting UE edema, warm extremities Resp: minimal bibasilar crackles, no increased work of breathing, no wheezing Abd: soft, NDNT, +BS  Assessment/Plan:  Principal Problem:   Hypervolemia Active Problems:   AVM (arteriovenous malformation) of colon   Sigmoid diverticulosis   Thrombocytopenia (HCC)   Cirrhosis of liver with ascites (HCC)   Acute combined systolic and diastolic heart failure (HCC)   Non-ST elevation (NSTEMI) myocardial infarction [facility])   Coronary artery disease   NSVT (nonsustained ventricular tachycardia) (HCC)  Anasarca  Pulmonary edema: Patient with resolution of his acute CHF 2/2 NSTEMI and massive fluid resuscitation as discussed below. He is still 7kg above admission weight with significant LE edema. Agree with cards that diuretics could be adjusted. Patient was saturating adequately on room air with rest; PT will ambulate him today to assess further oxygen requirement with exertion. --lasix 80mg  PO (home dose); will adjust as necessary --continue spironolactone 25mg  daily --follow bmets and replace electrolytes as necessary --continue encouraging activity as tolerated --supplemental oxygen as needed --daily weights, ins/outs --ambulate  qshift  NSTEMI 2/2 HypoTN and acute blood loss anemia  Acute combined congestive heart failure - resolved: Patient had L/RHC yesterday which showed diffuse but non-obstructive CAD; LVEF estimated to 55-65% with normal LV end diastolic pressure.  --ASA --will start starting lipid lowering therapy --start atenolol   Cirrhosis Portal HTN, s/p TIPS: --continue lactulose, pantoprazole   Diet: HH VTE ppx: heparin sq Code: FULL  Dispo: Anticipated discharge in approximately 4-5 day(s).   Alphonzo Grieve, MD 04/16/2017, 11:20 AM Pager [phone]

## 2017-04-17 DIAGNOSIS — K5730 Diverticulosis of large intestine without perforation or abscess without bleeding: Secondary | ICD-10-CM

## 2017-04-17 DIAGNOSIS — I471 Supraventricular tachycardia: Secondary | ICD-10-CM

## 2017-04-17 LAB — BASIC METABOLIC PANEL
Anion gap: 5 (ref 5–15)
BUN: 9 mg/dL (ref 6–20)
CALCIUM: 8.2 mg/dL — AB (ref 8.9–10.3)
CO2: 24 mmol/L (ref 22–32)
Chloride: 107 mmol/L (ref 101–111)
Creatinine, Ser: 1 mg/dL (ref 0.61–1.24)
GFR calc Af Amer: >60 mL/min (ref >60)
Glucose, Bld: 183 mg/dL — ABNORMAL HIGH (ref 65–99)
POTASSIUM: 4.5 mmol/L (ref 3.5–5.1)
SODIUM: 136 mmol/L (ref 135–145)

## 2017-04-17 MED ORDER — SPIRONOLACTONE 50 MG PO TABS: 50.0000 mg | ORAL_TABLET | Freq: Every day | ORAL | Status: DC

## 2017-04-17 MED ORDER — ATENOLOL 25 MG PO TABS: 25.0000 mg | ORAL_TABLET | Freq: Every day | ORAL | 0 refills | Status: DC

## 2017-04-17 MED ORDER — SPIRONOLACTONE 50 MG PO TABS: 50.0000 mg | ORAL_TABLET | Freq: Every day | ORAL | 0 refills | Status: DC

## 2017-04-17 MED ORDER — ATORVASTATIN CALCIUM 20 MG PO TABS: 20.0000 mg | ORAL_TABLET | Freq: Every day | ORAL | 0 refills | Status: AC

## 2017-04-17 MED ORDER — ASPIRIN 81 MG PO TBEC: 81.0000 mg | DELAYED_RELEASE_TABLET | Freq: Every day | ORAL | 0 refills | Status: AC

## 2017-04-17 NOTE — Discharge Instructions (Signed)
You were admitted to the hospital because of bleeding from abnormal veins in your esophagus and stomach because of you liver cirrhosis.  Because of you required lots of blood and fluids to keep you alive from the bleeding, you also went into heart failure and built up fluid in your lungs and legs.  You also had a heart attack because of the blood loss, and we found that you have some narrowing of the vessels in your heart.  Review your medication list carefully as there have been changes.  For your swelling, please use compression stockings and keep your feet elevated as much as possible.  Keep to a low salt diet and don't drink too much fluids.  Please follow-up with your PCP and cardiologist.  If you have more bleeding or dark black stools please come back to the ED.

## 2017-04-17 NOTE — Progress Notes (Signed)
Osie Bond to be D/C'd  per MD order. Discussed with the patient and all questions fully answered.  VSS, Skin clean, dry and intact without evidence of skin break down, no evidence of skin tears noted.  IV catheter discontinued intact. Site without signs and symptoms of complications. Dressing and pressure applied.  An After Visit Summary was printed and given to the patient. Patient received prescription.  D/c education completed with patient/family including follow up instructions, medication list, d/c activities limitations if indicated, with other d/c instructions as indicated by MD - patient able to verbalize understanding, all questions fully answered.   Patient instructed to return to ED, call 911, or call MD for any changes in condition.   Patient to be escorted via [place], and D/C home via private auto.

## 2017-04-17 NOTE — Progress Notes (Signed)
Internal Medicine Attending:   I saw and examined the patient. I reviewed the resident's note and I agree with the resident's findings and plan as documented in the resident's note.  Patient feels well today with no new complaints. He states that his lower extremity swelling is slowly improving and he is able to walk with the assistance of a walker. He denies any shortness of breath or lightheadedness or melena or bright red blood per rectum. Patient is a 66 year old male with a history of alcoholic cirrhosis who presented initially for GI bleeding with this hospital course complicated by hypovolemic shock, non-ST elevation MI, hypervolemia and pulmonary edema. Patient had a TIPS and BRTO while here for his esophageal varices and recurrent bleeding. He is now much improved and is stable for discharge home today. We'll discharge him on Lasix and spironolactone per cardiology recommendations as well as compression stockings. He will need to continue a PPI and lactulose for his alcoholic cirrhosis and GI bleeding. Will need follow-up with cardiology and his PCP as an outpatient.

## 2017-04-17 NOTE — Progress Notes (Signed)
[id number] Pt has been walking with PT and with his walker so did not walk. MI education completed with pt and significant other who voiced understanding. Reviewed NTG use, MI restrictions and gave CHF booklet. Discussed importance of daily weights and watching sodium. Gave low sodium handouts and discussed 2000 mg sodium restriction. Also discussed watching fluid intake. Gave walking instructions and discussed CRP 2. Will refer to [facility] as their preference. Pt eager to go home.Graylon Good RN BSN 04/17/2017 11:26 AM

## 2017-04-17 NOTE — Progress Notes (Signed)
Nutrition Follow-up  DOCUMENTATION CODES:   Not applicable  INTERVENTION:   -D/c Ensure Enlive po BID, each supplement provides 350 kcal and 20 grams of protein  NUTRITION DIAGNOSIS:   Inadequate oral intake related to acute illness as evidenced by other (see comment) (full liquid diet).  Resolved  GOAL:   Patient will meet greater than or equal to 90% of their needs  Met  MONITOR:   Diet advancement, PO intake, Supplement acceptance  REASON FOR ASSESSMENT:   Ventilator    ASSESSMENT:    66 y.o. male with PMH including but not limited to EtOH cirrhosis, portal gastropathy, and prior GI bleeds (3 admissions since Jan 2017, previously due to AVM and sigmoid diverticulosis). He was admitted 03/30/17 with recurrent GI bleed that started the day prior. S/P colonoscopy on 4/30. Developed respiratory failure and required intubation on 5/3.  5/7- extubated 5/12- transferred from ICU to floor 5/14- s/p cardiac cath  Pt in room with staff member at time of visit. Staff reports likely d/c to home today.   Diet advanced to heart healthy on 04/15/17. Appetite has improved; noted 100% meal completion. Per MAR, pt has been refusing Ensure supplements. RD will d/c in light of poor acceptance and increased meal intake.   Labs reviewed.   Diet Order:  Diet Heart Room service appropriate? Yes; Fluid consistency: Thin  Skin:  Reviewed, no issues  Last BM:  04/14/17  Height:   Ht Readings from Last 1 Encounters:  04/04/17 '5\' 11"'  (1.803 m)    Weight:   Wt Readings from Last 1 Encounters:  04/17/17 200 lb 9.6 oz (91 kg)    Ideal Body Weight:  78.2 kg  BMI:  Body mass index is 27.98 kg/m.  Estimated Nutritional Needs:   Kcal:  2000-2200  Protein:  100-120 gm  Fluid:  2-2.2 L  EDUCATION NEEDS:   No education needs identified at this time  Refael Fulop A. Jimmye Norman, RD, LDN, CDE Pager: [phone] After hours Pager: [phone]

## 2017-04-17 NOTE — Progress Notes (Signed)
Subjective:  Catheter is currently out and he is now able to walk.  Still requiring a walker.  Very mild shortness of breath when he walks.  No chest pain.  Blood pressure mildly up this morning but has been low at times.  Still significantly edematous but is walking.  Objective:  Vital Signs in the last 24 hours: BP 137/74   Pulse 83   Temp 98.3 F (36.8 C) (Oral)   Resp 18   Ht 5\' 11"  (1.803 m)   Wt 91 kg (200 lb 9.6 oz)   SpO2 95%   BMI 27.98 kg/m   Physical Exam: Pleasant male in no acute distress Lungs:  Clear Cardiac:  Regular rhythm, normal S1 and S2, no S3, 2/6 systolic murmur across precordium Extremities:  2+  edema present  Intake/Output from previous day: 05/15 0701 - 05/16 0700 In: 1080 [P.O.:1080] Out: 3400 [Urine:3400]  Weight Filed Weights   04/14/17 2104 04/16/17 0400 04/17/17 0518  Weight: 100.7 kg (222 lb 1.6 oz) 97.7 kg (215 lb 6.2 oz) 91 kg (200 lb 9.6 oz)    Lab Results: Basic Metabolic Panel:  Recent Labs  04/16/17 0131 04/17/17 0548  NA 135 136  K 3.9 4.5  CL 106 107  CO2 22 24  GLUCOSE 125* 183*  BUN 11 9  CREATININE 0.86 1.00   CBC:  Recent Labs  04/15/17 0724 04/16/17 0131  WBC 11.5* 10.4  HGB 12.0* 10.8*  HCT 38.1* 33.5*  MCV 89.9 88.9  PLT 141* 166    Assessment/Plan:  1.  Three-vessel coronary artery disease best treated medically 2.  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  Non-STEMI likely due to the stress of blood loss and bleeding  Recommendations:  He is still edematous and a lot of this may be due to his cirrhosis with hypoalbuminemia.  Would recommend compression stockings as well as increasing his spironolactone which should also help.  Continue diuresis at home.  I would need to see back in my office in 2 weeks.   Kerry Hough  MD [facility] Cardiology  04/17/2017, 9:04 AM

## 2017-04-17 NOTE — Progress Notes (Signed)
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 cardiac Cath on 5/14; PMHx: cirrhosis, GIB, CHF    PT Comments    Patient expected to d/c today. He has no concerns about functional mobility once he returns home and seems aware of his limitations. Ambulated 220 ft with supervision. Pt reported mild SOB, however on return to room Sp02 was at 100%. He is frequently getting up with mobility tech and making good progress towards goals. Is concerned about the possibility of HHPT because they have a large dog that can get aggressive with strangers; however pt would benefit from continued PT to increase functional mobility and independence.    Follow Up Recommendations  Home health PT;Supervision/Assistance - 24 hour     Equipment Recommendations  Rolling walker with 5" wheels;3in1 (PT)    Recommendations for Other Services OT consult     Precautions / Restrictions Precautions Precautions: Fall Precaution Comments: watch sats Restrictions Weight Bearing Restrictions: No    Mobility  Bed Mobility               General bed mobility comments: In chair on arrival  Transfers Overall transfer level: Modified independent Equipment used: Rolling walker (2 wheeled) Transfers: Sit to/from Stand Sit to Stand: Modified independent (Device/Increase time)            Ambulation/Gait Ambulation/Gait assistance: Supervision Ambulation Distance (Feet): 220 Feet Assistive device: Rolling walker (2 wheeled) Gait Pattern/deviations: Step-through pattern;Trunk flexed     General Gait Details: Cues for posture. Pt reports he has been getting up with out assist to ambulate down  the hallway. He walked the length of the hallway 6x yesterday. Today he has been getting up with mobility tech as he was made aware of the need for supervision with ambulation. Pt's SpO2 was 100 on return to room after ambulation.   Stairs            Wheelchair Mobility    Modified Rankin (Stroke Patients Only)       Balance Overall balance assessment: No apparent balance deficits (not formally assessed)   Sitting balance-Leahy Scale: Good                                      Cognition Arousal/Alertness: Awake/alert Behavior During Therapy: WFL for tasks assessed/performed Overall Cognitive Status: Within Functional Limits for tasks assessed                                        Exercises      General Comments        Pertinent Vitals/Pain Pain Score: 0-No pain Pain Intervention(s): Monitored during session    Home Living                      Prior Function            PT Goals (current goals can now be found in the care plan section) Acute Rehab PT Goals Patient Stated Goal: return home and play with 3yo grandson PT Goal Formulation: With patient/family  Time For Goal Achievement: 04/25/17 Potential to Achieve Goals: Good Progress towards PT goals: Progressing toward goals    Frequency    Min 3X/week      PT Plan Current plan remains appropriate    Co-evaluation              AM-PAC PT "6 Clicks" Daily Activity  Outcome Measure  Difficulty turning over in bed (including adjusting bedclothes, sheets and blankets)?: A Little Difficulty moving from lying on back to sitting on the side of the bed? : A Little Difficulty sitting down on and standing up from a chair with arms (e.g., wheelchair, bedside commode, etc,.)?: A Little Help needed moving to and from a bed to chair (including a wheelchair)?: A Little Help needed walking in hospital room?: None Help needed climbing 3-5 steps with a railing? : A  Little 6 Click Score: 19    End of Session Equipment Utilized During Treatment: Gait belt Activity Tolerance: Patient tolerated treatment well Patient left: in chair;with call bell/phone within reach Nurse Communication: Mobility status PT Visit Diagnosis: Difficulty in walking, not elsewhere classified (R26.2)     Time: 7092-9574 PT Time Calculation (min) (ACUTE ONLY): 12 min  Charges:  $Gait Training: 8-22 mins                    G Codes:      Benjiman Core, PTA Acute Rehab [place] 04/17/2017, 10:01 AM

## 2017-04-17 NOTE — Discharge Summary (Signed)
Name: John Warren MRN: [id number] DOB: December 26, 1950 66 y.o. PCP: Leonard Downing, MD  Date of Admission: 03/29/2017  6:22 PM Date of Discharge: 04/17/2017 Attending Physician: Aldine Contes, MD  Discharge Diagnosis:  Principal Problem:   Varices, esophageal ([place]) Active Problems:   AVM (arteriovenous malformation) of colon   Sigmoid diverticulosis   Thrombocytopenia ([place])   Cirrhosis of liver with ascites ([place])   Acute combined systolic and diastolic heart failure (HCC)   Non-ST elevation (NSTEMI) myocardial infarction (HCC)   Hypervolemia   Coronary artery disease   NSVT (nonsustained ventricular tachycardia) ([place])   Discharge Medications: Allergies as of 04/17/2017      Reactions   Penicillins Rash   Has patient had a PCN reaction causing immediate rash, facial/tongue/throat swelling, SOB or lightheadedness with hypotension: Yes Has patient had a PCN reaction causing severe rash involving mucus membranes or skin necrosis: No Has patient had a PCN reaction that required hospitalization: No Has patient had a PCN reaction occurring within the last 10 years: No If all of the above answers are "NO", then may proceed with Cephalosporin use. Had CTX before      Medication List    TAKE these medications   acetaminophen 500 MG tablet Commonly known as:  TYLENOL Take 500-1,000 mg by mouth every 8 (eight) hours as needed (for pain).   aspirin 81 MG EC tablet Take 1 tablet (81 mg total) by mouth daily. Start taking on:  04/18/2017   atenolol 25 MG tablet Commonly known as:  TENORMIN Take 1 tablet (25 mg total) by mouth daily.   atorvastatin 20 MG tablet Commonly known as:  LIPITOR Take 1 tablet (20 mg total) by mouth daily.   ferrous sulfate 325 (65 FE) MG tablet Take 1 tablet (325 mg total) by mouth 3 (three) times daily with meals. What changed:  when to take this   folic acid 1 MG tablet Commonly known as:  FOLVITE Take 1 tablet (1 mg total) by mouth  daily.   furosemide 80 MG tablet Commonly known as:  LASIX Take 80 mg by mouth daily.   lactulose 10 GM/15ML solution Commonly known as:  CHRONULAC Take 30 mLs (20 g total) by mouth daily.   nitroGLYCERIN 0.4 MG SL tablet Commonly known as:  NITROSTAT Place 0.4 mg under the tongue every 5 (five) minutes as needed for chest pain.   omeprazole 40 MG capsule Commonly known as:  PRILOSEC Take 1 capsule (40 mg total) by mouth every evening.   potassium chloride SA 20 MEQ tablet Commonly known as:  K-DUR,KLOR-CON Take 20 mEq by mouth every morning.   ranitidine 150 MG tablet Commonly known as:  ZANTAC Take 150 mg by mouth 2 (two) times daily.   rifaximin 550 MG Tabs tablet Commonly known as:  XIFAXAN Take 1 tablet (550 mg total) by mouth 2 (two) times daily.   spironolactone 50 MG tablet Commonly known as:  ALDACTONE Take 1 tablet (50 mg total) by mouth daily. What changed:  when to take this   thiamine 100 MG tablet Take 1 tablet (100 mg total) by mouth daily.   traMADol 50 MG tablet Commonly known as:  ULTRAM Take 50 mg by mouth every 6 (six) hours as needed (for pain).            Durable Medical Equipment        Start     Ordered   04/16/17 1436  For home use only DME Walker rolling  Once    Question:  Patient needs a walker to treat with the following condition  Answer:  CHF (congestive heart failure) ([place])   04/16/17 1435   04/16/17 1436  For home use only DME 3 n 1  Once     04/16/17 1435      Disposition and follow-up:   Mr.John Warren was discharged from [facility] in Stable condition.  At the hospital follow up visit please address:  1.  Variceal GI Bleeding.  Ask about clinical bleeding, check Hgb.  2.  CHF and Cirrhosis.  Assess volume status, manage diuretics, check electrolytes.  3.  Nonobstructive CAD.  Medical management.  Started on aspirin, statin and atenolol.  4.  Labs / imaging needed at time of follow-up: CBC,  CMP  5.  Pending labs/ test needing follow-up: lipid panel  Follow-up Appointments: Follow-up Information    Leonard Downing, MD Follow up in 6 day(s).   Specialty:  Family Medicine Why:  at [facility] information: [place] [address] [phone]        Jacolyn Reedy, MD. Schedule an appointment as soon as possible for a visit in 2 week(s).   Specialty:  Cardiology Contact information: [address] [place] [address] [place] Hospital Course by problem list: Principal Problem:   Varices, esophageal ([place]) Active Problems:   AVM (arteriovenous malformation) of colon   Sigmoid diverticulosis   Thrombocytopenia (HCC)   Cirrhosis of liver with ascites (HCC)   Acute combined systolic and diastolic heart failure (HCC)   Non-ST elevation (NSTEMI) myocardial infarction (HCC)   Hypervolemia   Coronary artery disease   NSVT (nonsustained ventricular tachycardia) ([place])   1. Variceal GI Bleeding due to Hepatic Cirrhosis Mr John Warren is a 66 year old man with history of alcoholic cirrhosis and GI bleeding who was admitted for melenic stools and hematochezia.  EGD revealed gastroesophageal varices which were not bleeding at the time.  Several days later he underwent colonoscopy and was found to have fresh blood in the colon and became hypotensive due to hemorrhagic shock.  He received 3U pRBCs for recurrent bleed presumed to be of variceal origin as well as volume resuscitation.  He then developed hypoxic respiratory failure due to pulmonary edema, was intubated and supported with pressors.  He was is the ICU for 10 days, volume status and hypoxia improving with diuresis and was successfully extubated and weaned off pressors.  He underwent TIPS and BATO for treatment and prevention of varices.  Following TIPS/BATO he had no further clinical bleeding and hemoglobin remained stable.  2. Volume Overload due to CHF  Exacerbation and Hepatic Cirrhosis/Hypoalbuminemia Due to need for blood transfusion and volume resuscitation in context of variceal bleeding, he entered iatrogenic volume overloaded state with pulmonary and peripheral edema.  He was diuresed with loop diuretics and volume status improved.  He was able to ambulate without desaturating on room air, though he still had 2+ pitting edema to the knees at discharge.  Discharged on prior to admission dose of oral lasix, increased spironolactone, and recommended compression stockings and low sodium diet.  3. NSTEMI and Non-Obstructive CAD Due to hemorrhagic shock and global hypoperfusion, Troponin peaked at 33.  He underwent cardiac catheterization and coronary angiography which revealed non-obstructive CAD.  Started on aspirin, atenolol, and statin for secondary prevention.  Literature shows statin use is safe in cirrhosis, moderate-intensity  dose of atorvastatin chosen on discharge due to uncertainty in dosing.   Discharge Vitals:   BP (!) 108/55 (BP Location: Left Arm)   Pulse 86   Temp 97.7 F (36.5 C) (Oral)   Resp 18   Ht 5\' 11"  (1.803 m)   Wt 200 lb 9.6 oz (91 kg)   SpO2 100%   BMI 27.98 kg/m   Pertinent Labs, Studies, and Procedures:   CBC Latest Ref Rng & Units 04/16/2017 04/15/2017 04/13/2017  WBC 4.0 - 10.5 K/uL 10.4 11.5(H) 11.2(H)  Hemoglobin 13.0 - 17.0 g/dL 10.8(L) 12.0(L) 10.8(L)  Hematocrit 39.0 - 52.0 % 33.5(L) 38.1(L) 34.5(L)  Platelets 150 - 400 K/uL 166 141(L) 115(L)   CMP Latest Ref Rng & Units 04/17/2017 04/16/2017 04/15/2017  Glucose 65 - 99 mg/dL 183(H) 125(H) 96  BUN 6 - 20 mg/dL 9 11 11   Creatinine 0.61 - 1.24 mg/dL 1.00 0.86 0.99  Sodium 135 - 145 mmol/L 136 135 137  Potassium 3.5 - 5.1 mmol/L 4.5 3.9 4.1  Chloride 101 - 111 mmol/L 107 106 102  CO2 22 - 32 mmol/L 24 22 24   Calcium 8.9 - 10.3 mg/dL 8.2(L) 7.7(L) 7.8(L)  Total Protein 6.5 - 8.1 g/dL - - -  Total Bilirubin 0.3 - 1.2 mg/dL - - -  Alkaline Phos 38 - 126  U/L - - -  AST 15 - 41 U/L - - -  ALT 17 - 63 U/L - - -    Cardiac Catheterization 04/15/2017 1. Diffuse but predominantly nonobstructive CAD. Only the distal LCx is 80%. 2. Normal LV function 3. Normal right heart pressures with low LV filling pressures. 4. Mild - moderate aortic stenosis. Mean gradient of 18 mm Hg. AVA 1.46 cm squared.   Plan: medical management. Will discontinue IV lasix.  Echocardiogram 04/05/2017 EF 45-50% (reduced from prior), anterior, apical, and inferoapical hypokinesis.  Findings could suggest LAD territory ischemia/infarct or Takatsubo cardiomyopathy. Elevated transvalvular gradients suggestive of at least moderate aortic valve stenosis.  Upper Endoscopy 04/04/2017 Grade II esophageal varices. - Type 1 isolated gastric varices (IGV1, varices located in the fundus), spurting blood. 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 - Normal duodenal bulb, first portion of the duodenum and second portion of the duodenum. - No specimens collected.  Colonoscopy 04/04/2017 - Preparation of the colon was fair. - Blood stained gloved finger found on digital rectal exam. - Blood in the entire examined colon. - Diverticulosis in the sigmoid colon and in the descending colon, without active bleeding. - Non-bleeding internal hemorrhoids. - No specimens collected.  Capsule Endoscopy 04/05/2017 Small, non-bleeding AVM  Discharge Instructions: Discharge Instructions    Amb Referral to Cardiac Rehabilitation    Complete by:  As directed    Diagnosis:  NSTEMI   Diet - low sodium heart healthy    Complete by:  As directed    Increase activity slowly    Complete by:  As directed     You were admitted to the hospital because of bleeding from abnormal veins in your esophagus and stomach because of you liver cirrhosis.  Because of you required lots of blood and fluids to keep you alive from the bleeding, you also went into heart failure and built up fluid in your lungs  and legs.  You also had a heart attack because of the blood loss, and we found that you have some narrowing of the vessels in your heart.  Review your medication list carefully as there have been changes.  For your swelling, please use compression stockings and keep your feet elevated as much as possible.  Keep to a low salt diet and don't drink too much fluids.  Please follow-up with your PCP and cardiologist.  If you have more bleeding or dark black stools please come back to the ED.  Signed: Minus Liberty, MD 04/17/2017, 2:06 PM   Pager: [phone]

## 2017-04-17 NOTE — Progress Notes (Addendum)
Subjective: No acute events overnight.  Walked up and down the hall with PT yesterday without dyspnea, hypoxia, or dizziness, eating and drinking well, eager to go home.  Legs still very swollen but getting a little better.  Objective:  Vital signs in last 24 hours: Vitals:   04/16/17 2052 04/16/17 2106 04/16/17 2149 04/17/17 0518  BP: (!) 94/51  (!) 95/45 (!) 102/56  Pulse: 93  95 83  Resp: 18     Temp: 98.2 F (36.8 C)   98.3 F (36.8 C)  TempSrc: Oral   Oral  SpO2: 97% 98%  95%  Weight:    200 lb 9.6 oz (91 kg)  Height:       Filed Weights   04/14/17 2104 04/16/17 0400 04/17/17 0518  Weight: 222 lb 1.6 oz (100.7 kg) 215 lb 6.2 oz (97.7 kg) 200 lb 9.6 oz (91 kg)    Intake/Output Summary (Last 24 hours) at 04/17/17 0651 Last data filed at 04/17/17 0257  Gross per 24 hour  Intake             1080 ml  Output             3400 ml  Net            -2320 ml   CBC Latest Ref Rng & Units 04/16/2017 04/15/2017 04/13/2017  WBC 4.0 - 10.5 K/uL 10.4 11.5(H) 11.2(H)  Hemoglobin 13.0 - 17.0 g/dL 10.8(L) 12.0(L) 10.8(L)  Hematocrit 39.0 - 52.0 % 33.5(L) 38.1(L) 34.5(L)  Platelets 150 - 400 K/uL 166 141(L) 115(L)   CMP Latest Ref Rng & Units 04/17/2017 04/16/2017 04/15/2017  Glucose 65 - 99 mg/dL 183(H) 125(H) 96  BUN 6 - 20 mg/dL 9 11 11   Creatinine 0.61 - 1.24 mg/dL 1.00 0.86 0.99  Sodium 135 - 145 mmol/L 136 135 137  Potassium 3.5 - 5.1 mmol/L 4.5 3.9 4.1  Chloride 101 - 111 mmol/L 107 106 102  CO2 22 - 32 mmol/L 24 22 24   Calcium 8.9 - 10.3 mg/dL 8.2(L) 7.7(L) 7.8(L)  Total Protein 6.5 - 8.1 g/dL - - -  Total Bilirubin 0.3 - 1.2 mg/dL - - -  Alkaline Phos 38 - 126 U/L - - -  AST 15 - 41 U/L - - -  ALT 17 - 63 U/L - - -   Physical Exam  Constitutional: He is oriented to person, place, and time. He appears well-developed and well-nourished. No distress.  Ambulating from bathroom to chair without assistance, dyspnea or instability  Cardiovascular: Normal rate and regular rhythm.    Distant heart sounds  Pulmonary/Chest: Effort normal and breath sounds normal. He has no rales.  Abdominal: Soft. There is no tenderness.  Musculoskeletal:  3+ pitting edema to knees  Neurological: He is alert and oriented to person, place, and time.  Skin:  Hyperpigmentation of R medial lower leg consistent with venous stasis  Psychiatric: He has a normal mood and affect. His behavior is normal.     Assessment/Plan:  Principal Problem:   Hypervolemia Active Problems:   AVM (arteriovenous malformation) of colon   Sigmoid diverticulosis   Thrombocytopenia (HCC)   Cirrhosis of liver with ascites (HCC)   Acute combined systolic and diastolic heart failure (HCC)   Non-ST elevation (NSTEMI) myocardial infarction [facility])   Coronary artery disease   NSVT (nonsustained ventricular tachycardia) ([place])   66 y.o. male with history of alcoholic cirrhosis admitted for GI bleeding.  His hospital course was complicated by massive re-bleeding requiring  volume resusciatation and transfusion, with secondary CHF exacerbation, hypervolemia, pulmonary edema, and NSTEMI.  He was intubated and on pressors for several days, and is now recovering well and nearing discharge.  He is no longer hypoxic, is maintaining BP without pressors, has had no further bleeding following TIPS and BRTO, and is nearing discharge.  #Hypervolemia #Lower Extremity Edema Improving, pulmonary edema resolved, near baseline weight.  Multifactorial with hypoalbuminemia secondary to cirrhosis, HFpEF, and iatrogenic from volume resuscitation. -continue home dose lasix 80 mg PO daily -increase spironolactone to 50 mg daily per cardiology recommendations -compression stockings  #GI Bleeding #Alcoholic Cirrhosis #Gastric Varices Resolved, Hgb stable, no further bleeding, s/p TIPS and BRTO. -Continue lactulose -Continue pantoprazole  #NSTEMI #HFpEF Resolved, secondary to global hypoperfusion from GI bleed.  Cath 5/14 with  nonobstructive CAD. -atenolol 12.5 mg BID -ASA 81 mg daily -start statin for secondary prevention on discharge   Fluids: none Diet: heart, fluid restrict 1500 mL DVT Prophylaxis: heparin Code Status: full  Dispo: Anticipated discharge in approximately 0-1 day(s).  He declines home health services.  Minus Liberty, MD 04/17/2017, 6:51 AM Pager: [phone]

## 2017-04-18 ENCOUNTER — Telehealth (HOSPITAL_COMMUNITY): Payer: Self-pay

## 2017-04-18 NOTE — Telephone Encounter (Signed)
Patient insurance is active and benefits verified. Patient insurance is [employer] medicare - $20.00 co-payment, no deductible, out of pocket $6700/$57.00 has been met, no co-insurance, no pre-authorization and no limit on visit. Passport/reference [id number].

## 2017-04-19 ENCOUNTER — Other Ambulatory Visit (HOSPITAL_COMMUNITY): Payer: Self-pay | Admitting: Interventional Radiology

## 2017-04-19 ENCOUNTER — Other Ambulatory Visit: Payer: Self-pay | Admitting: *Deleted

## 2017-04-19 DIAGNOSIS — I8511 Secondary esophageal varices with bleeding: Secondary | ICD-10-CM

## 2017-04-24 ENCOUNTER — Encounter (HOSPITAL_COMMUNITY): Payer: Self-pay | Admitting: Gastroenterology

## 2017-05-03 LAB — AMMONIA: Ammonia: 74 umol/L — ABNORMAL HIGH (ref <=47)

## 2017-05-04 LAB — COMPLETE METABOLIC PANEL WITH GFR
ALBUMIN: 2.9 g/dL — AB (ref 3.6–5.1)
ALK PHOS: 156 U/L — AB (ref 40–115)
ALT: 50 U/L — AB (ref 9–46)
AST: 78 U/L — ABNORMAL HIGH (ref 10–35)
BILIRUBIN TOTAL: 2.7 mg/dL — AB (ref 0.2–1.2)
BUN: 12 mg/dL (ref 7–25)
CO2: 21 mmol/L (ref 20–31)
CREATININE: 0.84 mg/dL (ref 0.70–1.25)
Calcium: 8.7 mg/dL (ref 8.6–10.3)
Chloride: 108 mmol/L (ref 98–110)
GFR, Est African American: >89 mL/min (ref >=60)
GFR, Est Non African American: >89 mL/min (ref >=60)
GLUCOSE: 97 mg/dL (ref 65–99)
Potassium: 5 mmol/L (ref 3.5–5.3)
SODIUM: 138 mmol/L (ref 135–146)
TOTAL PROTEIN: 5.7 g/dL — AB (ref 6.1–8.1)

## 2017-05-04 LAB — PROTIME-INR
INR: 1.1
Prothrombin Time: 11.5 s (ref 9.0–11.5)

## 2017-05-06 NOTE — Addendum Note (Signed)
Addendum  created 05/06/17 1448 by Vada Yellen, MD   Sign clinical note    

## 2017-05-07 ENCOUNTER — Ambulatory Visit
Admission: RE | Admit: 2017-05-07 | Discharge: 2017-05-07 | Disposition: A | Discharge status: Home / Self Care | Payer: Medicare Other | Source: Ambulatory Visit | Attending: Radiology | Admitting: Radiology

## 2017-05-07 ENCOUNTER — Ambulatory Visit
Admission: RE | Admit: 2017-05-07 | Discharge: 2017-05-07 | Disposition: A | Discharge status: Home / Self Care | Payer: Medicare Other | Source: Ambulatory Visit | Attending: Interventional Radiology | Admitting: Interventional Radiology

## 2017-05-07 DIAGNOSIS — I8511 Secondary esophageal varices with bleeding: Secondary | ICD-10-CM

## 2017-05-07 HISTORY — PX: IR RADIOLOGIST EVAL & MGMT: IMG5224

## 2017-05-07 NOTE — Progress Notes (Signed)
Patient ID: John Warren, male   DOB: Sep 03, 1951, 66 y.o.   MRN: [id number]       Chief Complaint: Patient was seen in consultation today for  Chief Complaint  Patient presents with  . Follow-up    1 mo follow up TIPS   at the request of Turpin,Pamela  Referring Physician(s): Turpin,Pamela  History of Present Illness: John Warren is a 66 y.o. male with a history of alcoholic cirrhosis who presented initially for GI bleeding with this hospital course complicated by hypovolemic shock, non-ST elevation MI, hypervolemia and pulmonary edema. Patient had a TIPS and BA/RTO05/01/2017. He is doing well. No longer needs  a walker. He denies any shortness of breath or lightheadedness or melena or bright red blood per rectum.   He is now much improved   today. He's taking his lactulose 15 mg twice a day. He notes some decreased energy especially morning. No side effects from the lactulose at this point.   Past Medical History:  Diagnosis Date  . Alcohol abuse   . Anemia   . AVM (arteriovenous malformation) of colon    a. By colonoscopy 07/2015.  Marland Kitchen Cirrhosis of liver ([place])   . Colon polyps   . COPD (chronic obstructive pulmonary disease) ([place])   . Diastolic CHF ([place])   . GERD (gastroesophageal reflux disease)   . GI bleed 01/2016  . Hypertension   . Hypokalemia   . Internal hemorrhoids   . Portal hypertensive gastropathy ([place])    a. By EGD 07/2015.  . Sigmoid diverticulosis   . SVT (supraventricular tachycardia) ([place])    a. 07/2015 in setting of severe hypokalemia.  . Symptomatic anemia    a. 07/2015 - felt multifactorial from cirrhosis and AVMs.  . Thrombocytopenia ([place])   . Tobacco use     Past Surgical History:  Procedure Laterality Date  . CATARACT EXTRACTION    . COLONOSCOPY N/A 07/15/2015   Procedure: COLONOSCOPY;  Surgeon: Wilford Corner, MD;  Location: [facility] ENDOSCOPY;  Service: Endoscopy;  Laterality: N/A;  . COLONOSCOPY WITH PROPOFOL N/A 04/04/2017   Procedure: COLONOSCOPY  WITH PROPOFOL;  Surgeon: Ronnette Juniper, MD;  Location: [place];  Service: Gastroenterology;  Laterality: N/A;  . ESOPHAGOGASTRODUODENOSCOPY N/A 07/15/2015   Procedure: ESOPHAGOGASTRODUODENOSCOPY (EGD);  Surgeon: Wilford Corner, MD;  Location: [facility] ENDOSCOPY;  Service: Endoscopy;  Laterality: N/A;  . ESOPHAGOGASTRODUODENOSCOPY N/A 12/30/2015   Procedure: ESOPHAGOGASTRODUODENOSCOPY (EGD);  Surgeon: Ronald Lobo, MD;  Location: [facility] ENDOSCOPY;  Service: Endoscopy;  Laterality: N/A;  . ESOPHAGOGASTRODUODENOSCOPY N/A 03/30/2017   Procedure: ESOPHAGOGASTRODUODENOSCOPY (EGD);  Surgeon: Laurence Spates, MD;  Location: [facility] ENDOSCOPY;  Service: Endoscopy;  Laterality: N/A;  . ESOPHAGOGASTRODUODENOSCOPY N/A 04/04/2017   Procedure: ESOPHAGOGASTRODUODENOSCOPY (EGD);  Surgeon: Arta Silence, MD;  Location: [facility] ENDOSCOPY;  Service: Endoscopy;  Laterality: N/A;  . ESOPHAGOGASTRODUODENOSCOPY (EGD) WITH PROPOFOL N/A 01/24/2016   Procedure: ESOPHAGOGASTRODUODENOSCOPY (EGD) WITH PROPOFOL;  Surgeon: Wonda Horner, MD;  Location: [facility] ENDOSCOPY;  Service: Endoscopy;  Laterality: N/A;  . GIVENS CAPSULE STUDY N/A 04/02/2017   Procedure: GIVENS CAPSULE STUDY;  Surgeon: Arta Silence, MD;  Location: [facility] ENDOSCOPY;  Service: Endoscopy;  Laterality: N/A;  . HERNIA REPAIR    . HOT HEMOSTASIS N/A 01/24/2016   Procedure: HOT HEMOSTASIS (ARGON PLASMA COAGULATION/BICAP);  Surgeon: Wonda Horner, MD;  Location: [facility] ENDOSCOPY;  Service: Endoscopy;  Laterality: N/A;  . IR ANGIOGRAM SELECTIVE EACH ADDITIONAL VESSEL  04/04/2017  . IR [place] ADDITIONAL VESSEL  04/04/2017  . IR EMBO ART  VEN HEMORR LYMPH EXTRAV  INC GUIDE ROADMAPPING  04/04/2017  . IR EMBO ART  VEN HEMORR LYMPH EXTRAV  INC GUIDE ROADMAPPING  04/04/2017  . IR TIPS  04/04/2017  . IR US GUIDE VASC ACCESS RIGHT  04/04/2017  . IR VENOGRAM RENAL UNI RIGHT  04/04/2017  . RADIOLOGY WITH ANESTHESIA N/A 04/04/2017   Procedure: RADIOLOGY WITH ANESTHESIA;  Surgeon: Radiologist,  Medication, MD;  Location: [place];  Service: Radiology;  Laterality: N/A;  . RIGHT/LEFT HEART CATH AND CORONARY ANGIOGRAPHY N/A 04/15/2017   Procedure: Right/Left Heart Cath and Coronary Angiography;  Surgeon: Martinique, Peter M, MD;  Location: [place] CV LAB;  Service: Cardiovascular;  Laterality: N/A;    Allergies: Penicillins  Medications: Prior to Admission medications   Medication Sig Start Date End Date Taking? Authorizing Provider  acetaminophen (TYLENOL) 500 MG tablet Take 500-1,000 mg by mouth every 8 (eight) hours as needed (for pain).   Yes [provider]  aspirin EC 81 MG EC tablet Take 1 tablet (81 mg total) by mouth daily. 04/18/17 05/18/17 Yes Minus Liberty, MD  atenolol (TENORMIN) 25 MG tablet Take 1 tablet (25 mg total) by mouth daily. 04/17/17 05/17/17 Yes Minus Liberty, MD  atorvastatin (LIPITOR) 20 MG tablet Take 1 tablet (20 mg total) by mouth daily. 04/17/17  Yes Minus Liberty, MD  ferrous sulfate 325 (65 FE) MG tablet Take 1 tablet (325 mg total) by mouth 3 (three) times daily with meals. Patient taking differently: Take 325 mg by mouth every evening.  07/17/15  Yes Dunn, Dayna N, PA-C  folic acid (FOLVITE) 1 MG tablet Take 1 tablet (1 mg total) by mouth daily. 01/02/16  Yes Thurnell Lose, MD  furosemide (LASIX) 80 MG tablet Take 80 mg by mouth daily.   Yes [provider]  lactulose (CHRONULAC) 10 GM/15ML solution Take 30 mLs (20 g total) by mouth daily. 01/26/16  Yes Florencia Reasons, MD  nitroGLYCERIN (NITROSTAT) 0.4 MG SL tablet Place 0.4 mg under the tongue every 5 (five) minutes as needed for chest pain.   Yes [provider]  omeprazole (PRILOSEC) 40 MG capsule Take 1 capsule (40 mg total) by mouth every evening. 01/02/16  Yes Thurnell Lose, MD  potassium chloride SA (K-DUR,KLOR-CON) 20 MEQ tablet Take 20 mEq by mouth every morning.    Yes [provider]  ranitidine (ZANTAC) 150 MG tablet Take 150 mg by mouth 2 (two)  times daily.   Yes [provider]  spironolactone (ALDACTONE) 50 MG tablet Take 1 tablet (50 mg total) by mouth daily. 04/17/17  Yes Minus Liberty, MD  thiamine 100 MG tablet Take 1 tablet (100 mg total) by mouth daily. 01/02/16  Yes Thurnell Lose, MD  traMADol (ULTRAM) 50 MG tablet Take 50 mg by mouth every 6 (six) hours as needed (for pain).   Yes [provider]  rifaximin (XIFAXAN) 550 MG TABS tablet Take 1 tablet (550 mg total) by mouth 2 (two) times daily. Patient not taking: Reported on 03/29/2017 01/26/16   Florencia Reasons, MD     Family History  Problem Relation Age of Onset  . Liver cancer Mother   . Diabetes Mellitus II Sister     Social History   Social History  . Marital status: Single    Spouse name: N/A  . Number of children: N/A  . Years of education: N/A   Social History Main Topics  . Smoking status: Former Smoker    Packs/day: 2.00  Years: 40.00    Types: Cigarettes    Quit date: 08/14/2008  . Smokeless tobacco: Never Used  . Alcohol use 0.0 oz/week     Comment: 1 pint whiskey daily several beer weekly    01/2016  " no alcohol in 3 weeks "  . Drug use: No  . Sexual activity: Not on file   Other Topics Concern  . Not on file   Social History Narrative  . No narrative on file    ECOG Status: 2 - Symptomatic, <50% confined to bed  Review of Systems: A 12 point ROS discussed and pertinent positives are indicated in the HPI above.  All other systems are negative.  Review of Systems  Vital Signs: BP (!) 108/52   Pulse 91   Temp 97.8 F (36.6 C) (Oral)   Resp 15   Ht 6' (1.829 m)   Wt 183 lb 9.6 oz (83.3 kg)   SpO2 100%   BMI 24.90 kg/m   Physical Exam  Mallampati Score:     Imaging:    Ct Abdomen Pelvis W Wo Contrast  Result Date: 04/10/2017 CLINICAL DATA:  66 year old male with a history of gastric varices and portal hypertension, status post balloon occluded trans venous obliteration of varices with TIPS. EXAM: CT  ABDOMEN AND PELVIS WITHOUT AND WITH CONTRAST TECHNIQUE: Multidetector CT imaging of the abdomen and pelvis was performed following the standard protocol before and following the bolus administration of intravenous contrast. CONTRAST:  158mL ISOVUE-300 IOPAMIDOL (ISOVUE-300) INJECTION 61% COMPARISON:  03/30/2017 FINDINGS: Lower chest: Pleural effusions bilateral lung bases. Associated atelectasis/ consolidation. Calcifications of the aortic valve. Native coronary calcifications. Bilateral gynecomastia Hepatobiliary: Interval changes of TIPS shunt which appears patent on the CT study. Similar configuration of the liver with nodular contour and decreasing volume with preferential enlargement of caudate. Differential enhancement of the posterior segment of the right liver, likely flow related given the new shunt. No evidence of pericholecystic fluid or inflammatory changes. High density material within the gallbladder either vicarious excretion of contrast or potentially biliary sludge/ stones. Pancreas: Unremarkable appearance of pancreas. Spleen: Unremarkable spleen. Adrenals/Urinary Tract: Unremarkable adrenal glands. No evidence of left-sided or right-sided hydronephrosis. No perinephric stranding on the left with the right. No nephrolithiasis. There is a focal region of differential enhancement on the lateral cortex of the right kidney which is new from the comparison. Unremarkable appearance of urinary bladder with catheter in position. Stomach/Bowel: Post treatment effects of embolization of the gastric varices with streak artifact at the level of the stomach extending into the esophageal varices system. No abnormally distended small bowel.  No transition point. Rectus seal. 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 No colonic wall thickening. Normal appendix. Lymphatic: Low-density free fluid within the pelvis. Mesenteric infiltration at the base of the mesenteric with trace free fluid  on the liver margin. Trace free fluid in the left upper quadrant adjacent to the spleen. No significant adenopathy. Body wall anasarca/ edema including evidence of scrotal swelling on the inferior images. Vasculature: Vascular calcifications of the abdominal aorta including mesenteric vessels and bilateral renal arteries. Proximal femoral vasculature is patent. Interval treatment changes of embolization of the portal to systemic shunt system with streak artifact in the gastric an esophageal varices. No evidence of persisting opacification of varices in the stomach. No evidence of nontarget embolization. Reproductive: Unremarkable appearance of the pelvic organs. Other: Abdominal wall anasarca/edema Musculoskeletal: No displaced fracture. Multilevel degenerative changes of the lumbar spine. Vacuum disc phenomenon at L4-L5.  IMPRESSION: Interval treatment changes of TIPS and embolization of gastric and esophageal varices. No evidence of persisting varices at the stomach or distal esophagus. Body wall and mesenteric anasarca/edema, with bilateral pleural effusions and small volume ascites in the pelvis. There is also evidence of scrotal edema, incompletely imaged. Focal differential enhancement of the lateral cortex of the right kidney, potentially flow related, or alternatively could represent a new renal infarction or renal infection. If there is concern for urinary tract infection, correlation with urinalysis may be useful. Cirrhotic liver changes. Differential geographic attenuation/enhancement of the posterior lobes of the right liver is favored to be a flow phenomenon given the new shunt. Aortic atherosclerosis and coronary artery disease. Electronically Signed   By: Corrie Mckusick D.O.   On: 04/10/2017 09:07   [place] Art/ven Flow Abd Pelv Doppler  Result Date: 05/07/2017 CLINICAL DATA:  Cirrhosis, portal venous hypertension, gastric varices, status post TIPS creation and gastric varix embolization 04/04/2017. EXAM:  DUPLEX ULTRASOUND OF LIVER AND TIPS SHUNT TECHNIQUE: Color and duplex Doppler ultrasound was performed to evaluate the hepatic in-flow and out-flow vessels. COMPARISON:  CT 04/09/2017 and previous FINDINGS: TIPS Shunt Velocities (all antegrade) Proximal:  121 Cm/sec Mid:  160 Distal:  124 cm/sec Portal Vein Velocities Main:  Hepatopetal, 61 cm/sec Right:  Hepatopetal, 121 cm/sec Left:  Hepatofugal, 23 cm/sec Hepatic Vein Velocities (all hepatofugal): Right:  124 cm/sec Middle:  32 cm/sec Left:  33 cm/sec Hepatic Artery Velocity:  106 cm/second Splenic Vein Velocity:  50 cm/second Varices:  None Ascites: None Other findings: None IMPRESSION: 1. Patent TIPS with reversal of flow in the left portal vein. 2. No ascites or visible varices. Electronically Signed   By: Lucrezia Europe M.D.   On: 05/07/2017 11:30   Dg Chest Port 1 View  Result Date: 04/11/2017 CLINICAL DATA:  Acute respiratory failure with hypoxia. EXAM: PORTABLE CHEST 1 VIEW COMPARISON:  Radiograph of Apr 09, 2017. FINDINGS: Stable cardiomediastinal silhouette. Atherosclerosis of thoracic aorta is noted. Right internal jugular venous catheter is unchanged with distal tip in expected position of SVC. No pneumothorax is noted. Increased bilateral diffuse lung opacities are noted concerning for edema or possibly inflammation. Probable mild bilateral pleural effusions are noted. Bony thorax is unremarkable. IMPRESSION: Aortic atherosclerosis. Worsening bilateral lung opacities most consistent with worsening edema with associated pleural effusions. Electronically Signed   By: Marijo Conception, M.D.   On: 04/11/2017 07:24   Dg Chest Port 1 View  Result Date: 04/09/2017 CLINICAL DATA:  Hypoxia. EXAM: PORTABLE CHEST 1 VIEW COMPARISON:  Earlier this day at 1836 hours FINDINGS: 1941 hour: Right internal jugular central venous catheter has been retracted, the tip projects over the region of the distal brachiocephalic vein. A persistent loop/ kink is seen proximally  in the right supraclavicular region. Diffuse bilateral interstitial opacities, right greater than left, with minimal improvement from recent prior. There are bilateral pleural effusions. Stable heart size and mediastinal contours. No pneumothorax. IMPRESSION: 1. Right internal jugular central venous catheter is been retracted, tip in the region the brachiocephalic vein. There is a persistent loop/kink in the supraclavicular region. 2. Persistent bilateral interstitial opacities likely pulmonary edema, mild improvement from recent prior may be technique related. Pleural effusions are again seen. Electronically Signed   By: Jeb Levering M.D.   On: 04/09/2017 20:07   Dg Chest Port 1 View  Result Date: 04/09/2017 CLINICAL DATA:  Central line placement EXAM: PORTABLE CHEST 1 VIEW COMPARISON:  Chest radiograph 04/05/2017 FINDINGS: There is a right  IJ central venous catheter with tip in the lower SVC. A portion of the proximal catheter is kinked, overlying the medial first rib. It is unclear whether this is internal or external. Diffuse bilateral opacities persist, suggesting severe pulmonary edema. Unchanged cardiomegaly and calcific aortic atherosclerosis. Small bilateral pleural effusions. IMPRESSION: 1. Right IJ central venous catheter tip in the lower SVC. Recommend visual inspection to determine whether the kinked portion is subcutaneous or external. 2. Diffuse pulmonary edema and small bilateral pleural effusions, consistent with CHF. Electronically Signed   By: Ulyses Jarred M.D.   On: 04/09/2017 18:55    Labs:  CBC:  Recent Labs  04/12/17 0408 04/13/17 0531 04/15/17 0724 04/16/17 0131  WBC 8.6 11.2* 11.5* 10.4  HGB 10.2* 10.8* 12.0* 10.8*  HCT 32.1* 34.5* 38.1* 33.5*  PLT 68* 115* 141* 166    COAGS:  Recent Labs  03/29/17 1855 03/31/17 0352 04/04/17 1530 05/03/17 1207  INR 1.34 1.32 1.49 1.1    BMP:  Recent Labs  04/15/17 0724 04/16/17 0131 04/17/17 0548 05/03/17 1207    NA 137 135 136 138  K 4.1 3.9 4.5 5.0  CL 102 106 107 108  CO2 24 22 24 21   GLUCOSE 96 125* 183* 97  BUN 11 11 9 12   CALCIUM 7.8* 7.7* 8.2* 8.7  CREATININE 0.99 0.86 1.00 0.84  GFRNONAA >60 >60 >60 >89  GFRAA >60 >60 >60 >89    LIVER FUNCTION TESTS:  Recent Labs  03/30/17 0228 04/01/17 0301 04/04/17 1918  04/11/17 0445 04/12/17 0408 04/13/17 0531 05/03/17 1207  BILITOT 1.1 0.7 3.3*  --   --   --   --  2.7*  AST 33 26 66*  --   --   --   --  78*  ALT 23 18 25   --   --   --   --  50*  ALKPHOS 70 52 63  --   --   --   --  156*  PROT 5.0* 4.2* 5.6*  --   --   --   --  5.7*  ALBUMIN 3.0* 2.3* 3.5  < > 2.0* 1.8* 2.0* 2.9*  < > = values in this interval not displayed.    Ref Range & Units 4d ago  Ammonia < OR = 47 umol/L 74      TUMOR MARKERS: No results for input(s): AFPTM, CEA, CA199, CHROMGRNA in the last 8760 hours.  Assessment and Plan: My impression is that this patient's done well post TIPS and BATO/BRTO. TIPS looks good on ultrasound. He is living at home and getting along well, with suggestion of some mild hepatic encephalopathy. His ammonia is up, and I think he  needs to increase his lactulose. We had a long discussion about the benefits of lactulose in the setting of cirrhosis post TIPS to avoid hepatic encephalopathy. He's not fond of the side effects however. I'd like to see him up to 30 mg 3 times a day, but he's reluctant to make any sudden increase this, so he'll slowly increases dosing as he can tolerate.  We reviewed the expectation is post TIPS and the BA/RTO.   He'll need endoscopic surveillance in about 3 months to confirm involution of the gastric varices.  He can also have a discussion with Dr. Michail Sermon at that time about long-term lactulose needs.  We'll plan to see him back at the 6 month mark for a protocol post BRTO CT and clinic visit.   Thank you for this  interesting consult.  I greatly enjoyed meeting John Warren and look forward to  participating in their care.  A copy of this report was sent to the requesting provider on this date.  Electronically Signed: Senora Lacson III, DAYNE Teala Daffron 05/07/2017, 11:33 AM   I spent a total of    40 Minutes in face to face in clinical consultation, greater than 50% of which was counseling/coordinating care for cirrhosis and portal venous hypertension post TIPS and BRTO.

## 2017-05-09 ENCOUNTER — Telehealth (HOSPITAL_COMMUNITY): Payer: Self-pay

## 2017-05-09 NOTE — Telephone Encounter (Signed)
I called and spoke with patient about scheduling and participating in cardiac rehab. Patient declined scheduling for cardiac rehab. I confirmed with patient that he is not interested. Referral canceled.

## 2017-05-17 ENCOUNTER — Encounter: Payer: Self-pay | Admitting: Interventional Radiology

## 2017-07-24 NOTE — Addendum Note (Signed)
Addendum  created 07/24/17 1414 by Albertha Ghee, MD   Sign clinical note

## 2017-08-02 ENCOUNTER — Other Ambulatory Visit: Payer: Self-pay | Admitting: Gastroenterology

## 2017-09-11 ENCOUNTER — Other Ambulatory Visit: Payer: Self-pay | Admitting: Gastroenterology

## 2017-09-17 ENCOUNTER — Encounter (HOSPITAL_COMMUNITY): Payer: Self-pay | Admitting: *Deleted

## 2017-09-17 NOTE — Progress Notes (Signed)
Spoke with pt for pre-op call. Pt denies cardiac history. Pt states that he is not diabetic. Pt had acute respiratory failure in May while in the hospital with esophageal varices bleed.   EKG - 04/05/17 - EPIC CXR - 04/11/17 -EPIC (1-view) Echo -04/05/17 - EPIC Cath - 04/15/17 - EPIC

## 2017-09-18 ENCOUNTER — Encounter (HOSPITAL_COMMUNITY)
Admission: RE | Disposition: A | Discharge status: Home / Self Care | Payer: Self-pay | Source: Ambulatory Visit | Attending: Gastroenterology

## 2017-09-18 ENCOUNTER — Ambulatory Visit (HOSPITAL_COMMUNITY): Payer: Medicare Other | Admitting: Anesthesiology

## 2017-09-18 ENCOUNTER — Encounter (HOSPITAL_COMMUNITY): Payer: Self-pay | Admitting: *Deleted

## 2017-09-18 ENCOUNTER — Ambulatory Visit (HOSPITAL_COMMUNITY)
Admission: RE | Admit: 2017-09-18 | Discharge: 2017-09-18 | Disposition: A | Discharge status: Home / Self Care | Payer: Medicare Other | Source: Ambulatory Visit | Attending: Gastroenterology | Admitting: Gastroenterology

## 2017-09-18 DIAGNOSIS — K7460 Unspecified cirrhosis of liver: Secondary | ICD-10-CM | POA: Diagnosis not present

## 2017-09-18 DIAGNOSIS — Z79899 Other long term (current) drug therapy: Secondary | ICD-10-CM | POA: Insufficient documentation

## 2017-09-18 DIAGNOSIS — I864 Gastric varices: Secondary | ICD-10-CM | POA: Insufficient documentation

## 2017-09-18 DIAGNOSIS — Z791 Long term (current) use of non-steroidal anti-inflammatories (NSAID): Secondary | ICD-10-CM | POA: Diagnosis not present

## 2017-09-18 DIAGNOSIS — I8500 Esophageal varices without bleeding: Secondary | ICD-10-CM | POA: Diagnosis not present

## 2017-09-18 DIAGNOSIS — K449 Diaphragmatic hernia without obstruction or gangrene: Secondary | ICD-10-CM | POA: Diagnosis not present

## 2017-09-18 HISTORY — DX: Unspecified osteoarthritis, unspecified site: M19.90

## 2017-09-18 HISTORY — DX: Personal history of other medical treatment: Z92.89

## 2017-09-18 HISTORY — DX: Headache: R51

## 2017-09-18 HISTORY — PX: ESOPHAGOGASTRODUODENOSCOPY (EGD) WITH PROPOFOL: SHX5813

## 2017-09-18 HISTORY — DX: Personal history of other diseases of the digestive system: Z87.19

## 2017-09-18 HISTORY — DX: Headache, unspecified: R51.9

## 2017-09-18 SURGERY — ESOPHAGOGASTRODUODENOSCOPY (EGD) WITH PROPOFOL
Anesthesia: Monitor Anesthesia Care

## 2017-09-18 MED ORDER — PHENYLEPHRINE HCL 10 MG/ML IJ SOLN
INTRAMUSCULAR | Status: DC | PRN
  Administered 2017-09-18: 120 ug via INTRAVENOUS

## 2017-09-18 MED ORDER — PROPOFOL 500 MG/50ML IV EMUL
INTRAVENOUS | Status: DC | PRN
  Administered 2017-09-18: 100 ug/kg/min via INTRAVENOUS

## 2017-09-18 MED ORDER — SODIUM CHLORIDE 0.9 % IV SOLN: INTRAVENOUS | Status: DC

## 2017-09-18 MED ORDER — LIDOCAINE HCL (CARDIAC) 20 MG/ML IV SOLN
INTRAVENOUS | Status: DC | PRN
  Administered 2017-09-18: 100 mg via INTRATRACHEAL

## 2017-09-18 MED ORDER — LACTATED RINGERS IV SOLN
INTRAVENOUS | Status: DC
  Administered 2017-09-18: 10:00:00 via INTRAVENOUS

## 2017-09-18 SURGICAL SUPPLY — 14 items

## 2017-09-18 NOTE — Anesthesia Preprocedure Evaluation (Signed)
Anesthesia Evaluation  Patient identified by MRN, date of birth, ID band Patient awake    Reviewed: Allergy & Precautions, NPO status , Patient's Chart, lab work & pertinent test results  Airway Mallampati: II  TM Distance: >3 FB Neck ROM: Full    Dental  (+) Dental Advisory Given,    Pulmonary former smoker,    breath sounds clear to auscultation       Cardiovascular hypertension,  Rhythm:Regular Rate:Normal     Neuro/Psych    GI/Hepatic   Endo/Other    Renal/GU      Musculoskeletal   Abdominal   Peds  Hematology   Anesthesia Other Findings   Reproductive/Obstetrics                             Anesthesia Physical Anesthesia Plan  ASA: III  Anesthesia Plan: MAC   Post-op Pain Management:    Induction: Intravenous  PONV Risk Score and Plan: Ondansetron and Dexamethasone  Airway Management Planned: Natural Airway and Nasal Cannula  Additional Equipment:   Intra-op Plan:   Post-operative Plan:   Informed Consent: I have reviewed the patients History and Physical, chart, labs and discussed the procedure including the risks, benefits and alternatives for the proposed anesthesia with the patient or authorized representative who has indicated his/her understanding and acceptance.   Dental advisory given  Plan Discussed with: CRNA and Anesthesiologist  Anesthesia Plan Comments:         Anesthesia Quick Evaluation

## 2017-09-18 NOTE — Discharge Instructions (Signed)

## 2017-09-18 NOTE — H&P (Signed)
Date of Initial H&P: 09/11/17  History reviewed, patient examined, no change in status, stable for surgery.

## 2017-09-18 NOTE — Transfer of Care (Signed)
Immediate Anesthesia Transfer of Care Note  Patient: John Warren  Procedure(s) Performed: ESOPHAGOGASTRODUODENOSCOPY (EGD) WITH PROPOFOL (N/A )  Patient Location: Endoscopy Unit  Anesthesia Type:MAC  Level of Consciousness: awake, alert  and oriented  Airway & Oxygen Therapy: Patient Spontanous Breathing and Patient connected to nasal cannula oxygen  Post-op Assessment: Report given to RN, Post -op Vital signs reviewed and stable and Patient moving all extremities X 4  Post vital signs: Reviewed and stable  Last Vitals:  Vitals:   09/18/17 0857  BP: 130/64  Resp: 14  Temp: 36.6 C  SpO2: 100%    Last Pain:  Vitals:   09/18/17 0857  TempSrc: Oral         Complications: No apparent anesthesia complications

## 2017-09-18 NOTE — Op Note (Signed)
[facility] Patient Name: John Warren Procedure Date : 09/18/2017 MRN: [id number] Attending MD: Lear Ng , MD Date of Birth: 1951-04-05 CSN: [id number] Age: 66 Admit Type: Outpatient Procedure:                Upper GI endoscopy Indications:              Gastric varices, ; Evaluate gastric varices s/p                            BRTO, Cirrhosis Providers:                Lear Ng, MD, Carollee Sires, Technician Referring MD:             Leonard Downing Medicines:                Propofol per Anesthesia, Monitored Anesthesia Care Complications:            No immediate complications. Estimated Blood Loss:     Estimated blood loss: none. Procedure:                Pre-Anesthesia Assessment:                           - Prior to the procedure, a History and Physical                            was performed, and patient medications and                            allergies were reviewed. The patient's tolerance of                            previous anesthesia was also reviewed. The risks                            and benefits of the procedure and the sedation                            options and risks were discussed with the patient.                            All questions were answered, and informed consent                            was obtained. Prior Anticoagulants: The patient has                            taken no previous anticoagulant or antiplatelet                            agents. ASA Grade Assessment: III - A patient with  severe systemic disease. After reviewing the risks                            and benefits, the patient was deemed in                            satisfactory condition to undergo the procedure.                           After obtaining informed consent, the endoscope was                            passed under direct vision. Throughout the                    procedure, the patient's blood pressure, pulse, and                            oxygen saturations were monitored continuously. The                            [id number] ([id number]) scope was introduced through the                            mouth, and advanced to the second part of duodenum.                            The upper GI endoscopy was accomplished without                            difficulty. The patient tolerated the procedure                            well. Scope In: Scope Out: Findings:      Grade I varices were found in the distal esophagus.      The exam of the esophagus was otherwise normal.      The Z-line was regular and was found 42 cm from the incisors.      There is no endoscopic evidence of varices in the stomach. BRTO material       noted in fundus at site of previous gastric varices that are not visible       now.      A medium-sized hiatal hernia was present.      The exam of the stomach was otherwise normal.      The examined duodenum was normal. Impression:               - Grade I esophageal varices.                           - Z-line regular, 42 cm from the incisors.                           - Medium-sized hiatal hernia.                           -  Normal examined duodenum.                           - No specimens collected. Moderate Sedation:      N/A - MAC procedure Recommendation:           - Patient has a contact number available for                            emergencies. The signs and symptoms of potential                            delayed complications were discussed with the                            patient. Return to normal activities tomorrow.                            Written discharge instructions were provided to the                            patient.                           - Low sodium diet.                           - Return to my office as previously scheduled.                           - Continue present  medications. Procedure Code(s):        --- Professional ---                           [phone], Esophagogastroduodenoscopy, flexible,                            transoral; diagnostic, including collection of                            specimen(s) by brushing or washing, when performed                            (separate procedure) Diagnosis Code(s):        --- Professional ---                           I86.4, Gastric varices                           I85.00, Esophageal varices without bleeding                           K44.9, Diaphragmatic hernia without obstruction or                            gangrene CPT copyright 2016 American Medical Association. All rights reserved. The codes documented  in this report are preliminary and upon coder review may  be revised to meet current compliance requirements. Lear Ng, MD 09/18/2017 10:55:52 AM This report has been signed electronically. Number of Addenda: 0

## 2017-09-18 NOTE — Anesthesia Postprocedure Evaluation (Signed)
Anesthesia Post Note  Patient: John Warren  Procedure(s) Performed: ESOPHAGOGASTRODUODENOSCOPY (EGD) WITH PROPOFOL (N/A )     Patient location during evaluation: Endoscopy Anesthesia Type: MAC Level of consciousness: awake, awake and alert and oriented Pain management: pain level controlled Vital Signs Assessment: post-procedure vital signs reviewed and stable Respiratory status: spontaneous breathing, nonlabored ventilation and respiratory function stable Cardiovascular status: blood pressure returned to baseline Anesthetic complications: no    Last Vitals:  Vitals:   09/18/17 1045 09/18/17 1100  BP: (!) 106/57 110/66  Resp: 20   Temp: 36.4 C   SpO2: 96%     Last Pain:  Vitals:   09/18/17 1045  TempSrc: Oral                 Terrica Duecker COKER

## 2017-09-18 NOTE — Interval H&P Note (Signed)
History and Physical Interval Note:  09/18/2017 10:09 AM  John Warren  has presented today for surgery, with the diagnosis of Cirrhosis  The various methods of treatment have been discussed with the patient and family. After consideration of risks, benefits and other options for treatment, the patient has consented to  Procedure(s): ESOPHAGOGASTRODUODENOSCOPY (EGD) WITH PROPOFOL (N/A) as a surgical intervention .  The patient's history has been reviewed, patient examined, no change in status, stable for surgery.  I have reviewed the patient's chart and labs.  Questions were answered to the patient's satisfaction.     [place] C.

## 2017-10-22 ENCOUNTER — Other Ambulatory Visit (HOSPITAL_COMMUNITY): Payer: Self-pay | Admitting: Interventional Radiology

## 2017-10-22 ENCOUNTER — Telehealth: Payer: Self-pay | Admitting: *Deleted

## 2017-10-22 DIAGNOSIS — K7030 Alcoholic cirrhosis of liver without ascites: Secondary | ICD-10-CM

## 2017-10-22 NOTE — Telephone Encounter (Signed)
Called John Warren to schedule f/u from [facility] and he states he cannot take on anymore financial debt and would not like to schedule the Ct and f/u with Dr. Vernard Gambles. He states he is very grateful for Dr. Vernard Gambles and should he have any problems he will contact our office.I encouraged him to apply for financial asst./vm

## 2018-01-03 ENCOUNTER — Other Ambulatory Visit: Payer: Self-pay

## 2018-01-03 ENCOUNTER — Observation Stay (HOSPITAL_COMMUNITY)
Admission: EM | Admit: 2018-01-03 | Discharge: 2018-01-04 | Disposition: A | Discharge status: Home / Self Care | Payer: Medicare Other | Attending: Family Medicine | Admitting: Family Medicine

## 2018-01-03 ENCOUNTER — Emergency Department (HOSPITAL_COMMUNITY): Payer: Medicare Other

## 2018-01-03 ENCOUNTER — Encounter (HOSPITAL_COMMUNITY): Payer: Self-pay | Admitting: Emergency Medicine

## 2018-01-03 DIAGNOSIS — I2510 Atherosclerotic heart disease of native coronary artery without angina pectoris: Secondary | ICD-10-CM | POA: Insufficient documentation

## 2018-01-03 DIAGNOSIS — K7460 Unspecified cirrhosis of liver: Secondary | ICD-10-CM | POA: Diagnosis not present

## 2018-01-03 DIAGNOSIS — D500 Iron deficiency anemia secondary to blood loss (chronic): Secondary | ICD-10-CM | POA: Insufficient documentation

## 2018-01-03 DIAGNOSIS — R4182 Altered mental status, unspecified: Secondary | ICD-10-CM

## 2018-01-03 DIAGNOSIS — I4581 Long QT syndrome: Secondary | ICD-10-CM | POA: Diagnosis not present

## 2018-01-03 DIAGNOSIS — Z880 Allergy status to penicillin: Secondary | ICD-10-CM | POA: Diagnosis not present

## 2018-01-03 DIAGNOSIS — Z87891 Personal history of nicotine dependence: Secondary | ICD-10-CM | POA: Insufficient documentation

## 2018-01-03 DIAGNOSIS — Q2733 Arteriovenous malformation of digestive system vessel: Secondary | ICD-10-CM | POA: Diagnosis not present

## 2018-01-03 DIAGNOSIS — K7290 Hepatic failure, unspecified without coma: Secondary | ICD-10-CM | POA: Diagnosis not present

## 2018-01-03 DIAGNOSIS — J449 Chronic obstructive pulmonary disease, unspecified: Secondary | ICD-10-CM | POA: Diagnosis not present

## 2018-01-03 DIAGNOSIS — Z23 Encounter for immunization: Secondary | ICD-10-CM | POA: Diagnosis not present

## 2018-01-03 DIAGNOSIS — I10 Essential (primary) hypertension: Secondary | ICD-10-CM | POA: Diagnosis not present

## 2018-01-03 DIAGNOSIS — F1021 Alcohol dependence, in remission: Secondary | ICD-10-CM | POA: Insufficient documentation

## 2018-01-03 DIAGNOSIS — R402412 Glasgow coma scale score 13-15, at arrival to emergency department: Secondary | ICD-10-CM | POA: Insufficient documentation

## 2018-01-03 DIAGNOSIS — I252 Old myocardial infarction: Secondary | ICD-10-CM | POA: Insufficient documentation

## 2018-01-03 DIAGNOSIS — Z79899 Other long term (current) drug therapy: Secondary | ICD-10-CM | POA: Insufficient documentation

## 2018-01-03 DIAGNOSIS — R7989 Other specified abnormal findings of blood chemistry: Secondary | ICD-10-CM

## 2018-01-03 DIAGNOSIS — K7682 Hepatic encephalopathy: Secondary | ICD-10-CM

## 2018-01-03 LAB — URINALYSIS, ROUTINE W REFLEX MICROSCOPIC
Bilirubin Urine: NEGATIVE
Hgb urine dipstick: NEGATIVE
KETONES UR: NEGATIVE mg/dL
Leukocytes, UA: NEGATIVE
Nitrite: NEGATIVE
PH: 7 (ref 5.0–8.0)
Protein, ur: NEGATIVE mg/dL
SPECIFIC GRAVITY, URINE: 1.017 (ref 1.005–1.030)

## 2018-01-03 LAB — COMPREHENSIVE METABOLIC PANEL
ALBUMIN: 3.3 g/dL — AB (ref 3.5–5.0)
ALK PHOS: 104 U/L (ref 38–126)
ALT: 20 U/L (ref 17–63)
AST: 36 U/L (ref 15–41)
Anion gap: 12 (ref 5–15)
BUN: 11 mg/dL (ref 6–20)
CALCIUM: 9 mg/dL (ref 8.9–10.3)
CO2: 16 mmol/L — AB (ref 22–32)
CREATININE: 0.87 mg/dL (ref 0.61–1.24)
Chloride: 112 mmol/L — ABNORMAL HIGH (ref 101–111)
GFR calc Af Amer: >60 mL/min (ref >60)
GFR calc non Af Amer: >60 mL/min (ref >60)
GLUCOSE: 121 mg/dL — AB (ref 65–99)
Potassium: 3.9 mmol/L (ref 3.5–5.1)
SODIUM: 140 mmol/L (ref 135–145)
Total Bilirubin: 2.3 mg/dL — ABNORMAL HIGH (ref 0.3–1.2)
Total Protein: 6.2 g/dL — ABNORMAL LOW (ref 6.5–8.1)

## 2018-01-03 LAB — CBC WITH DIFFERENTIAL/PLATELET
BASOS PCT: 0 %
Basophils Absolute: 0 10*3/uL (ref 0.0–0.1)
EOS ABS: 0.4 10*3/uL (ref 0.0–0.7)
Eosinophils Relative: 4 %
HCT: 46.9 % (ref 39.0–52.0)
HEMOGLOBIN: 16.3 g/dL (ref 13.0–17.0)
Lymphocytes Relative: 14 %
Lymphs Abs: 1.2 10*3/uL (ref 0.7–4.0)
MCH: 32.3 pg (ref 26.0–34.0)
MCHC: 34.8 g/dL (ref 30.0–36.0)
MCV: 92.9 fL (ref 78.0–100.0)
Monocytes Absolute: 0.8 10*3/uL (ref 0.1–1.0)
Monocytes Relative: 9 %
NEUTROS PCT: 73 %
Neutro Abs: 6.5 10*3/uL (ref 1.7–7.7)
Platelets: 86 10*3/uL — ABNORMAL LOW (ref 150–400)
RBC: 5.05 MIL/uL (ref 4.22–5.81)
RDW: 14.1 % (ref 11.5–15.5)
WBC: 8.9 10*3/uL (ref 4.0–10.5)

## 2018-01-03 LAB — LIPASE, BLOOD: LIPASE: 36 U/L (ref 11–51)

## 2018-01-03 LAB — PROTIME-INR
INR: 1.31
Prothrombin Time: 16.1 seconds — ABNORMAL HIGH (ref 11.4–15.2)

## 2018-01-03 LAB — AMMONIA: Ammonia: 141 umol/L — ABNORMAL HIGH (ref 9–35)

## 2018-01-03 LAB — I-STAT TROPONIN, ED: Troponin i, poc: 0 ng/mL (ref 0.00–0.08)

## 2018-01-03 LAB — I-STAT CG4 LACTIC ACID, ED
LACTIC ACID, VENOUS: 1.89 mmol/L (ref 0.5–1.9)
Lactic Acid, Venous: 2.57 mmol/L (ref 0.5–1.9)

## 2018-01-03 LAB — BRAIN NATRIURETIC PEPTIDE: B Natriuretic Peptide: 146.1 pg/mL — ABNORMAL HIGH (ref 0.0–100.0)

## 2018-01-03 MED ORDER — HYDROCODONE-ACETAMINOPHEN 5-325 MG PO TABS: 1.0000 | ORAL_TABLET | ORAL | Status: DC | PRN

## 2018-01-03 MED ORDER — ATORVASTATIN CALCIUM 20 MG PO TABS
20.0000 mg | ORAL_TABLET | Freq: Every day | ORAL | Status: DC
  Administered 2018-01-04: 20 mg via ORAL
  Filled 2018-01-03: qty 1

## 2018-01-03 MED ORDER — FERROUS SULFATE 325 (65 FE) MG PO TABS
325.0000 mg | ORAL_TABLET | Freq: Three times a day (TID) | ORAL | Status: DC
  Administered 2018-01-04 (×2): 325 mg via ORAL
  Filled 2018-01-03 (×2): qty 1

## 2018-01-03 MED ORDER — FUROSEMIDE 40 MG PO TABS
80.0000 mg | ORAL_TABLET | Freq: Every day | ORAL | Status: DC
  Administered 2018-01-04: 80 mg via ORAL
  Filled 2018-01-03: qty 2

## 2018-01-03 MED ORDER — SODIUM CHLORIDE 0.9% FLUSH: 3.0000 mL | INTRAVENOUS | Status: DC | PRN

## 2018-01-03 MED ORDER — VITAMIN B-1 100 MG PO TABS
100.0000 mg | ORAL_TABLET | Freq: Every day | ORAL | Status: DC
  Administered 2018-01-04: 100 mg via ORAL
  Filled 2018-01-03: qty 1

## 2018-01-03 MED ORDER — SODIUM CHLORIDE 0.9% FLUSH
3.0000 mL | Freq: Two times a day (BID) | INTRAVENOUS | Status: DC
  Administered 2018-01-04: 3 mL via INTRAVENOUS

## 2018-01-03 MED ORDER — IPRATROPIUM-ALBUTEROL 0.5-2.5 (3) MG/3ML IN SOLN: 3.0000 mL | Freq: Four times a day (QID) | RESPIRATORY_TRACT | Status: DC

## 2018-01-03 MED ORDER — PANTOPRAZOLE SODIUM 40 MG PO TBEC
40.0000 mg | DELAYED_RELEASE_TABLET | Freq: Every day | ORAL | Status: DC
  Administered 2018-01-04: 40 mg via ORAL
  Filled 2018-01-03: qty 1

## 2018-01-03 MED ORDER — ONDANSETRON HCL 4 MG PO TABS: 4.0000 mg | ORAL_TABLET | Freq: Four times a day (QID) | ORAL | Status: DC | PRN

## 2018-01-03 MED ORDER — LACTULOSE 10 GM/15ML PO SOLN
30.0000 g | Freq: Three times a day (TID) | ORAL | Status: DC
  Administered 2018-01-04 (×2): 30 g via ORAL
  Filled 2018-01-03 (×2): qty 45

## 2018-01-03 MED ORDER — ACETAMINOPHEN 500 MG PO TABS: 500.0000 mg | ORAL_TABLET | Freq: Three times a day (TID) | ORAL | Status: DC | PRN

## 2018-01-03 MED ORDER — IOPAMIDOL (ISOVUE-300) INJECTION 61%
INTRAVENOUS | Status: AC
Start: 2018-01-03 — End: 2018-01-04
  Filled 2018-01-03: qty 75

## 2018-01-03 MED ORDER — NITROGLYCERIN 0.4 MG SL SUBL: 0.4000 mg | SUBLINGUAL_TABLET | SUBLINGUAL | Status: DC | PRN

## 2018-01-03 MED ORDER — FOLIC ACID 1 MG PO TABS
1.0000 mg | ORAL_TABLET | Freq: Every day | ORAL | Status: DC
  Administered 2018-01-04: 1 mg via ORAL
  Filled 2018-01-03: qty 1

## 2018-01-03 MED ORDER — ZOLPIDEM TARTRATE 5 MG PO TABS: 5.0000 mg | ORAL_TABLET | Freq: Every evening | ORAL | Status: DC | PRN

## 2018-01-03 MED ORDER — SODIUM CHLORIDE 0.9 % IV SOLN: 250.0000 mL | INTRAVENOUS | Status: DC | PRN

## 2018-01-03 MED ORDER — TRAMADOL HCL 50 MG PO TABS: 50.0000 mg | ORAL_TABLET | Freq: Four times a day (QID) | ORAL | Status: DC | PRN

## 2018-01-03 MED ORDER — SPIRONOLACTONE 25 MG PO TABS
50.0000 mg | ORAL_TABLET | Freq: Every day | ORAL | Status: DC
  Administered 2018-01-04: 50 mg via ORAL
  Filled 2018-01-03: qty 2

## 2018-01-03 MED ORDER — ONDANSETRON HCL 4 MG/2ML IJ SOLN: 4.0000 mg | Freq: Four times a day (QID) | INTRAMUSCULAR | Status: DC | PRN

## 2018-01-03 MED ORDER — ASPIRIN EC 81 MG PO TBEC
81.0000 mg | DELAYED_RELEASE_TABLET | Freq: Every day | ORAL | Status: DC
  Administered 2018-01-04: 81 mg via ORAL
  Filled 2018-01-03: qty 1

## 2018-01-03 NOTE — ED Notes (Signed)
Patient transported to CT 

## 2018-01-03 NOTE — ED Notes (Signed)
Pt given decaf coffee, chicken broth, and a [place] sandwich.

## 2018-01-03 NOTE — ED Notes (Signed)
Patient transported to X-ray 

## 2018-01-03 NOTE — H&P (Signed)
[facility]                                                                                    Patient Demographics  John Warren, is a 67 y.o. male  CSN: [id number]  MRN: [id number]  DOB - 03-20-1951  Admit Date - 01/03/2018  Outpatient Primary MD for the patient is Leonard Downing, MD   With History of -  Past Medical History:  Diagnosis Date  . Alcohol abuse   . Anemia   . Arthritis   . AVM (arteriovenous malformation) of colon    a. By colonoscopy 07/2015.  Marland Kitchen Cirrhosis of liver ([place])   . Colon polyps   . COPD (chronic obstructive pulmonary disease) ([place])   . Diastolic CHF ([place])   . GERD (gastroesophageal reflux disease)   . GI bleed 01/2016  . Headache    cluster headaches  . History of blood transfusion   . History of hiatal hernia   . Hypertension   . Hypokalemia   . Internal hemorrhoids   . Portal hypertensive gastropathy ([place])    a. By EGD 07/2015.  . Sigmoid diverticulosis   . SVT (supraventricular tachycardia) ([place])    a. 07/2015 in setting of severe hypokalemia.  . Symptomatic anemia    a. 07/2015 - felt multifactorial from cirrhosis and AVMs.  . Thrombocytopenia ([place])   . Tobacco use       Past Surgical History:  Procedure Laterality Date  . CATARACT EXTRACTION    . COLONOSCOPY N/A 07/15/2015   Procedure: COLONOSCOPY;  Surgeon: Wilford Corner, MD;  Location: [facility] ENDOSCOPY;  Service: Endoscopy;  Laterality: N/A;  . COLONOSCOPY WITH PROPOFOL N/A 04/04/2017   Procedure: COLONOSCOPY WITH PROPOFOL;  Surgeon: Ronnette Juniper, MD;  Location: [place];  Service: Gastroenterology;  Laterality: N/A;  . ESOPHAGOGASTRODUODENOSCOPY N/A 07/15/2015   Procedure: ESOPHAGOGASTRODUODENOSCOPY (EGD);  Surgeon: Wilford Corner, MD;  Location: [facility] ENDOSCOPY;  Service: Endoscopy;  Laterality: N/A;  . ESOPHAGOGASTRODUODENOSCOPY N/A 12/30/2015   Procedure: ESOPHAGOGASTRODUODENOSCOPY (EGD);  Surgeon: Ronald Lobo, MD;  Location: [facility] ENDOSCOPY;  Service:  Endoscopy;  Laterality: N/A;  . ESOPHAGOGASTRODUODENOSCOPY N/A 03/30/2017   Procedure: ESOPHAGOGASTRODUODENOSCOPY (EGD);  Surgeon: Laurence Spates, MD;  Location: [facility] ENDOSCOPY;  Service: Endoscopy;  Laterality: N/A;  . ESOPHAGOGASTRODUODENOSCOPY N/A 04/04/2017   Procedure: ESOPHAGOGASTRODUODENOSCOPY (EGD);  Surgeon: Arta Silence, MD;  Location: [facility] ENDOSCOPY;  Service: Endoscopy;  Laterality: N/A;  . ESOPHAGOGASTRODUODENOSCOPY (EGD) WITH PROPOFOL N/A 01/24/2016   Procedure: ESOPHAGOGASTRODUODENOSCOPY (EGD) WITH PROPOFOL;  Surgeon: Wonda Horner, MD;  Location: [facility] ENDOSCOPY;  Service: Endoscopy;  Laterality: N/A;  . ESOPHAGOGASTRODUODENOSCOPY (EGD) WITH PROPOFOL N/A 09/18/2017   Procedure: ESOPHAGOGASTRODUODENOSCOPY (EGD) WITH PROPOFOL;  Surgeon: Wilford Corner, MD;  Location: [place];  Service: Endoscopy;  Laterality: N/A;  . GIVENS CAPSULE STUDY N/A 04/02/2017   Procedure: GIVENS CAPSULE STUDY;  Surgeon: Arta Silence, MD;  Location: [facility] ENDOSCOPY;  Service: Endoscopy;  Laterality: N/A;  . HERNIA REPAIR    . HOT HEMOSTASIS N/A 01/24/2016   Procedure: HOT HEMOSTASIS (ARGON PLASMA COAGULATION/BICAP);  Surgeon: Wonda Horner, MD;  Location: [facility] ENDOSCOPY;  Service: Endoscopy;  Laterality: N/A;  . IR ANGIOGRAM SELECTIVE EACH ADDITIONAL  VESSEL  04/04/2017  . IR [place] ADDITIONAL VESSEL  04/04/2017  . IR EMBO ART  VEN HEMORR LYMPH EXTRAV  INC GUIDE ROADMAPPING  04/04/2017  . IR EMBO ART  VEN HEMORR LYMPH EXTRAV  INC GUIDE ROADMAPPING  04/04/2017  . IR RADIOLOGIST EVAL & MGMT  05/07/2017  . IR TIPS  04/04/2017  . IR US GUIDE VASC ACCESS RIGHT  04/04/2017  . IR VENOGRAM RENAL UNI RIGHT  04/04/2017  . RADIOLOGY WITH ANESTHESIA N/A 04/04/2017   Procedure: RADIOLOGY WITH ANESTHESIA;  Surgeon: Radiologist, Medication, MD;  Location: [place];  Service: Radiology;  Laterality: N/A;  . RIGHT/LEFT HEART CATH AND CORONARY ANGIOGRAPHY N/A 04/15/2017   Procedure: Right/Left Heart Cath and Coronary  Angiography;  Surgeon: Martinique, Peter M, MD;  Location: [place] CV LAB;  Service: Cardiovascular;  Laterality: N/A;    in for   Chief Complaint  Patient presents with  . Altered Mental Status     HPI  John Warren  is a 67 y.o. male, with past medical history significant for liver cirrhosis, status post TIPS, AVM of the colon, anemia , history of COPD presenting with few days history of increasing confusion. CT of the head was negative but his ammonia level was elevated. Patient taking lactulose 30 g by mouth daily since he has a "violent"reaction to high doses of lactulose . Patient is pleasantly confused, denies any chest pains, headaches, dizziness of loss of consciousness.    Review of Systems    In addition to the HPI above,  No Fever-chills, No Headache, No changes with Vision or hearing, No problems swallowing food or Liquids, No Chest pain, Cough or Shortness of Breath, No Abdominal pain, No Nausea or Vommitting, Bowel movements are regular, No Blood in stool or Urine, No dysuria, No new skin rashes or bruises, No new joints pains-aches,  No new weakness, tingling, numbness in any extremity, No recent weight gain or loss, No polyuria, polydypsia or polyphagia, No significant Mental Stressors.  A full 10 point Review of Systems was done, except as stated above, all other Review of Systems were negative.   Social History Social History   Tobacco Use  . Smoking status: Former Smoker    Packs/day: 2.00    Years: 40.00    Pack years: 80.00    Types: Cigarettes    Last attempt to quit: 08/14/2008    Years since quitting: 9.3  . Smokeless tobacco: Never Used  Substance Use Topics  . Alcohol use: Yes    Alcohol/week: 0.0 oz    Comment: 1 pint whiskey daily several beer weekly    01/2016  " no alcohol in 3 weeks "     Family History Family History  Problem Relation Age of Onset  . Liver cancer Mother   . Diabetes Mellitus II Sister      Prior to Admission  medications   Medication Sig Start Date End Date Taking? Authorizing Provider  acetaminophen (TYLENOL) 500 MG tablet Take 500-1,000 mg by mouth every 8 (eight) hours as needed (for pain).   Yes [provider]  aspirin EC 81 MG tablet Take 81 mg by mouth daily.   Yes [provider]  atorvastatin (LIPITOR) 20 MG tablet Take 1 tablet (20 mg total) by mouth daily. 04/17/17  Yes Minus Liberty, MD  ferrous sulfate 325 (65 FE) MG tablet Take 1 tablet (325 mg total) by mouth 3 (three) times daily with meals. Patient taking differently: Take 325 mg by  mouth 2 (two) times daily with a meal.  07/17/15  Yes Dunn, Dayna N, PA-C  folic acid (FOLVITE) 1 MG tablet Take 1 tablet (1 mg total) by mouth daily. 01/02/16  Yes Thurnell Lose, MD  furosemide (LASIX) 80 MG tablet Take 80 mg by mouth daily.   Yes [provider]  lactulose (CHRONULAC) 10 GM/15ML solution Take 30 mLs (20 g total) by mouth daily. 01/26/16  Yes Florencia Reasons, MD  nitroGLYCERIN (NITROSTAT) 0.4 MG SL tablet Place 0.4 mg under the tongue every 5 (five) minutes as needed for chest pain.   Yes [provider]  omeprazole (PRILOSEC) 40 MG capsule Take 1 capsule (40 mg total) by mouth every evening. 01/02/16  Yes Thurnell Lose, MD  potassium chloride SA (K-DUR,KLOR-CON) 20 MEQ tablet Take 20 mEq by mouth every morning.    Yes [provider]  ranitidine (ZANTAC) 150 MG tablet Take 150 mg by mouth 2 (two) times daily.   Yes [provider]  spironolactone (ALDACTONE) 50 MG tablet Take 1 tablet (50 mg total) by mouth daily. 04/17/17  Yes Minus Liberty, MD  thiamine 100 MG tablet Take 1 tablet (100 mg total) by mouth daily. Patient taking differently: Take 250 mg by mouth daily.  01/02/16  Yes Thurnell Lose, MD  traMADol (ULTRAM) 50 MG tablet Take 50 mg by mouth every 6 (six) hours as needed (for pain).   Yes [provider]  atenolol (TENORMIN) 25 MG tablet Take 1 tablet (25 mg  total) by mouth daily. Patient not taking: Reported on 01/03/2018 04/17/17 05/17/17  Minus Liberty, MD  rifaximin (XIFAXAN) 550 MG TABS tablet Take 1 tablet (550 mg total) by mouth 2 (two) times daily. Patient not taking: Reported on 03/29/2017 01/26/16   Florencia Reasons, MD    Allergies  Allergen Reactions  . Penicillins Rash    Has patient had a PCN reaction causing immediate rash, facial/tongue/throat swelling, SOB or lightheadedness with hypotension: Yes Has patient had a PCN reaction causing severe rash involving mucus membranes or skin necrosis: No Has patient had a PCN reaction that required hospitalization: No Has patient had a PCN reaction occurring within the last 10 years: No If all of the above answers are "NO", then may proceed with Cephalosporin use. Had CTX before    Physical Exam  Vitals  Blood pressure (!) 147/71, pulse 100, temperature 97.9 F (36.6 C), temperature source Oral, resp. rate (!) 21, SpO2 98 %.   1. General well developed male, very pleasant  2. Normal affect and insight, Not Suicidal or Homicidal, pleasantly confused.  3. No F.N deficits, patient moving all extremities.  4. Ears and Eyes appear Normal, Conjunctivae clear, PERRLA. Moist Oral Mucosa.  5. Supple Neck, No JVD, No cervical lymphadenopathy appriciated, No Carotid Bruits.  6. Symmetrical Chest wall movement, Good air movement bilaterally, CTAB.  7. RRR, No Gallops, Rubs or Murmurs, No Parasternal Heave.  8. Positive Bowel Sounds, Abdomen Soft, Non tender, No organomegaly appriciated,No rebound -guarding or rigidity.  9.  No Cyanosis, Normal Skin Turgor, No Skin Rash or Bruise.  10. Good muscle tone,  bilateral lower extremity edema noted.    Data Review  CBC Recent Labs  Lab 01/03/18 1747  WBC 8.9  HGB 16.3  HCT 46.9  PLT 86*  MCV 92.9  MCH 32.3  MCHC 34.8  RDW 14.1  LYMPHSABS 1.2  MONOABS 0.8  EOSABS 0.4  BASOSABS 0.0    ------------------------------------------------------------------------------------------------------------------  Chemistries  Recent Labs  Lab 01/03/18 1747  NA 140  K 3.9  CL 112*  CO2 16*  GLUCOSE 121*  BUN 11  CREATININE 0.87  CALCIUM 9.0  AST 36  ALT 20  ALKPHOS 104  BILITOT 2.3*   ------------------------------------------------------------------------------------------------------------------ CrCl cannot be calculated (Unknown ideal weight.). ------------------------------------------------------------------------------------------------------------------ No results for input(s): TSH, T4TOTAL, T3FREE, THYROIDAB in the last 72 hours.  Invalid input(s): FREET3   Coagulation profile Recent Labs  Lab 01/03/18 1747  INR 1.31   ------------------------------------------------------------------------------------------------------------------- No results for input(s): DDIMER in the last 72 hours. -------------------------------------------------------------------------------------------------------------------  Cardiac Enzymes No results for input(s): CKMB, TROPONINI, MYOGLOBIN in the last 168 hours.  Invalid input(s): CK ------------------------------------------------------------------------------------------------------------------ Invalid input(s): POCBNP   ---------------------------------------------------------------------------------------------------------------  Urinalysis    Component Value Date/Time   COLORURINE YELLOW 01/03/2018 1843   APPEARANCEUR CLEAR 01/03/2018 1843   LABSPEC 1.017 01/03/2018 1843   PHURINE 7.0 01/03/2018 1843   GLUCOSEU >=500 (A) 01/03/2018 1843   HGBUR NEGATIVE 01/03/2018 1843   BILIRUBINUR NEGATIVE 01/03/2018 1843   KETONESUR NEGATIVE 01/03/2018 1843   PROTEINUR NEGATIVE 01/03/2018 1843   UROBILINOGEN 1.0 07/12/2015 1019   NITRITE NEGATIVE 01/03/2018 1843   LEUKOCYTESUR NEGATIVE 01/03/2018 1843     ----------------------------------------------------------------------------------------------------------------   Imaging results:   Dg Chest 2 View  Result Date: 01/03/2018 CLINICAL DATA:  Fatigue and AMS. Patient arrived via [facility]. Patient's wife called stating that patient was confused. She reports that he has a history of Cirrhosis and "gets like this sometimes" she believes it his ammonia. Hx of HTN and COPD. Former smoker. EXAM: CHEST  2 VIEW COMPARISON:  04/11/2017 FINDINGS: There has been significant improvement in aeration since the most recent exams. There are prominent interstitial markings throughout the lungs without focal consolidations. Within the right upper lobe, there is focal opacity raising the question of a pulmonary mass. Consider further evaluation with CT. There is midthoracic spondylosis. IMPRESSION: 1. Prominent interstitial markings and bronchitic changes. 2. Question of right upper lobe mass warranting further evaluation. CT of the chest is recommended. Intravenous contrast is recommended unless contraindicated. Electronically Signed   By: Nolon Nations M.D.   On: 01/03/2018 18:08   Ct Head Wo Contrast  Result Date: 01/03/2018 CLINICAL DATA:  Altered level of consciousness. EXAM: CT HEAD WITHOUT CONTRAST TECHNIQUE: Contiguous axial images were obtained from the base of the skull through the vertex without intravenous contrast. COMPARISON:  MRI 12/17/2008 FINDINGS: Brain: There is atrophy and chronic small vessel disease changes. No acute intracranial abnormality. Specifically, no hemorrhage, hydrocephalus, mass lesion, acute infarction, or significant intracranial injury. Vascular: No hyperdense vessel or unexpected calcification. Skull: No acute calvarial abnormality. Sinuses/Orbits: Visualized paranasal sinuses and mastoids clear. Orbital soft tissues unremarkable. Other: None IMPRESSION: No acute intracranial abnormality. Atrophy, chronic microvascular  disease. Electronically Signed   By: Rolm Baptise M.D.   On: 01/03/2018 18:39   Ct Chest Wo Contrast  Result Date: 01/03/2018 CLINICAL DATA:  Possible lung mass. EXAM: CT CHEST WITHOUT CONTRAST TECHNIQUE: Multidetector CT imaging of the chest was performed following the standard protocol without IV contrast. COMPARISON:  Radiograph of same day. FINDINGS: Cardiovascular: Atherosclerosis of thoracic aorta is noted without aneurysm formation. Normal cardiac size. No pericardial effusion. Coronary artery calcifications are noted. Mediastinum/Nodes: No enlarged mediastinal or axillary lymph nodes. Thyroid gland, trachea, and esophagus demonstrate no significant findings. Lungs/Pleura: No pneumothorax or pleural effusion is noted. Emphysematous disease is noted in the upper lobes bilaterally. Mild biapical scarring is noted. No acute pulmonary disease is noted. Upper Abdomen: No acute  abnormality. Musculoskeletal: No chest wall mass or suspicious bone lesions identified. IMPRESSION: Coronary artery calcifications are noted suggesting coronary artery disease. No acute pulmonary abnormality is noted. TIPS stent is seen in the liver. Aortic Atherosclerosis (ICD10-I70.0) and Emphysema (ICD10-J43.9). Electronically Signed   By: Marijo Conception, M.D.   On: 01/03/2018 20:42    My personal review of EKG: Rhythm NSR, Rate at 90 bpm with right after enlargement and nonspecific ST changes. Prolonged QT noted    Assessment & Plan  1. Hepatic encephalopathy, worsened : Ammonia level 141 . 2. Liver cirrhosis/alcoholism status post TIPS 3. History of COPD/colonic AVMs  Plan  Place in observation Increase lactulose to 30 g 3 times a day Monitor ammonia level in a.m.   DVT Prophylaxis SCDs  AM Labs Ordered, also please review Full Orders  Family Communication: Admission, patients condition and plan of care including tests being ordered have been discussed with the patient and wife who indicate understanding and  agree with the plan and Code Status.  Code Status full  Disposition Plan: Home  Time spent in minutes : 38  Condition GUARDED   @SIGNATURE @

## 2018-01-03 NOTE — ED Provider Notes (Signed)
[facility] EMERGENCY DEPARTMENT Provider Note   CSN: [id number] Arrival date & time: 01/03/18  1730     History   Chief Complaint Chief Complaint  Patient presents with  . Altered Mental Status    HPI John Warren is a 67 y.o. male.  The history is provided by the patient and medical records. No language interpreter was used.  Altered Mental Status   This is a new problem. The current episode started yesterday. The problem has not changed since onset.Associated symptoms include confusion and somnolence. Pertinent negatives include no unresponsiveness and no agitation. His past medical history is significant for liver disease, hypertension and COPD.  Cough  This is a chronic problem. The problem occurs constantly. The problem has not changed since onset.Pertinent negatives include no chest pain, no chills, no rhinorrhea, no shortness of breath and no wheezing. His past medical history is significant for COPD.    Past Medical History:  Diagnosis Date  . Alcohol abuse   . Anemia   . Arthritis   . AVM (arteriovenous malformation) of colon    a. By colonoscopy 07/2015.  Marland Kitchen Cirrhosis of liver ([place])   . Colon polyps   . COPD (chronic obstructive pulmonary disease) ([place])   . Diastolic CHF ([place])   . GERD (gastroesophageal reflux disease)   . GI bleed 01/2016  . Headache    cluster headaches  . History of blood transfusion   . History of hiatal hernia   . Hypertension   . Hypokalemia   . Internal hemorrhoids   . Portal hypertensive gastropathy ([place])    a. By EGD 07/2015.  . Sigmoid diverticulosis   . SVT (supraventricular tachycardia) ([place])    a. 07/2015 in setting of severe hypokalemia.  . Symptomatic anemia    a. 07/2015 - felt multifactorial from cirrhosis and AVMs.  . Thrombocytopenia ([place])   . Tobacco use     Patient Active Problem List   Diagnosis Date Noted  . Hypervolemia   . Coronary artery disease   . NSVT (nonsustained ventricular  tachycardia) ([place])   . Acute combined systolic and diastolic heart failure ([place]) 04/07/2017  . Non-ST elevation (NSTEMI) myocardial infarction ([place]) 04/07/2017  . Cirrhosis of liver with ascites ([place])   . Varices, esophageal ([place])   . Acute upper GI bleeding 12/29/2015  . AVM (arteriovenous malformation) of colon   . Internal hemorrhoids   . Sigmoid diverticulosis   . Alcohol use disorder   . Tobacco use disorder   . Thrombocytopenia ([place])   . COPD (chronic obstructive pulmonary disease) ([place]) 07/13/2015  . Iron deficiency anemia due to chronic blood loss 07/11/2015    Past Surgical History:  Procedure Laterality Date  . CATARACT EXTRACTION    . COLONOSCOPY N/A 07/15/2015   Procedure: COLONOSCOPY;  Surgeon: Wilford Corner, MD;  Location: [facility] ENDOSCOPY;  Service: Endoscopy;  Laterality: N/A;  . COLONOSCOPY WITH PROPOFOL N/A 04/04/2017   Procedure: COLONOSCOPY WITH PROPOFOL;  Surgeon: Ronnette Juniper, MD;  Location: [place];  Service: Gastroenterology;  Laterality: N/A;  . ESOPHAGOGASTRODUODENOSCOPY N/A 07/15/2015   Procedure: ESOPHAGOGASTRODUODENOSCOPY (EGD);  Surgeon: Wilford Corner, MD;  Location: [facility] ENDOSCOPY;  Service: Endoscopy;  Laterality: N/A;  . ESOPHAGOGASTRODUODENOSCOPY N/A 12/30/2015   Procedure: ESOPHAGOGASTRODUODENOSCOPY (EGD);  Surgeon: Ronald Lobo, MD;  Location[facility]COPY;  Service: Endoscopy;  Laterality: N/A;  . ESOPHAGOGASTRODUODENOSCOPY N/A 03/30/2017   Procedure: ESOPHAGOGASTRODUODENOSCOPY (EGD);  Surgeon: Laurence Spates, MD;  Location: [facility] ENDOSCOPY;  Service: Endoscopy;  Laterality: N/A;  .  ESOPHAGOGASTRODUODENOSCOPY N/A 04/04/2017   Procedure: ESOPHAGOGASTRODUODENOSCOPY (EGD);  Surgeon: Arta Silence, MD;  Location: [facility] ENDOSCOPY;  Service: Endoscopy;  Laterality: N/A;  . ESOPHAGOGASTRODUODENOSCOPY (EGD) WITH PROPOFOL N/A 01/24/2016   Procedure: ESOPHAGOGASTRODUODENOSCOPY (EGD) WITH PROPOFOL;  Surgeon: Wonda Horner, MD;  Location: [facility] ENDOSCOPY;  Service:  Endoscopy;  Laterality: N/A;  . ESOPHAGOGASTRODUODENOSCOPY (EGD) WITH PROPOFOL N/A 09/18/2017   Procedure: ESOPHAGOGASTRODUODENOSCOPY (EGD) WITH PROPOFOL;  Surgeon: Wilford Corner, MD;  Location: [name];  Service: Endoscopy;  Laterality: N/A;  . GIVENS CAPSULE STUDY N/A 04/02/2017   Procedure: GIVENS CAPSULE STUDY;  Surgeon: Arta Silence, MD;  Location: [facility] ENDOSCOPY;  Service: Endoscopy;  Laterality: N/A;  . HERNIA REPAIR    . HOT HEMOSTASIS N/A 01/24/2016   Procedure: HOT HEMOSTASIS (ARGON PLASMA COAGULATION/BICAP);  Surgeon: Wonda Horner, MD;  Location: [facility] ENDOSCOPY;  Service: Endoscopy;  Laterality: N/A;  . IR ANGIOGRAM SELECTIVE EACH ADDITIONAL VESSEL  04/04/2017  . IR [place] ADDITIONAL VESSEL  04/04/2017  . IR EMBO ART  VEN HEMORR LYMPH EXTRAV  INC GUIDE ROADMAPPING  04/04/2017  . IR EMBO ART  VEN HEMORR LYMPH EXTRAV  INC GUIDE ROADMAPPING  04/04/2017  . IR RADIOLOGIST EVAL & MGMT  05/07/2017  . IR TIPS  04/04/2017  . IR US GUIDE VASC ACCESS RIGHT  04/04/2017  . IR VENOGRAM RENAL UNI RIGHT  04/04/2017  . RADIOLOGY WITH ANESTHESIA N/A 04/04/2017   Procedure: RADIOLOGY WITH ANESTHESIA;  Surgeon: Radiologist, Medication, MD;  Location: [place];  Service: Radiology;  Laterality: N/A;  . RIGHT/LEFT HEART CATH AND CORONARY ANGIOGRAPHY N/A 04/15/2017   Procedure: Right/Left Heart Cath and Coronary Angiography;  Surgeon: Martinique, Peter M, MD;  Location: [place] CV LAB;  Service: Cardiovascular;  Laterality: N/A;       Home Medications    Prior to Admission medications   Medication Sig Start Date End Date Taking? Authorizing Provider  acetaminophen (TYLENOL) 500 MG tablet Take 500-1,000 mg by mouth every 8 (eight) hours as needed (for pain).    [provider]  atenolol (TENORMIN) 25 MG tablet Take 1 tablet (25 mg total) by mouth daily. 04/17/17 05/17/17  Minus Liberty, MD  atorvastatin (LIPITOR) 20 MG tablet Take 1 tablet (20 mg total) by mouth daily. 04/17/17    Minus Liberty, MD  ferrous sulfate 325 (65 FE) MG tablet Take 1 tablet (325 mg total) by mouth 3 (three) times daily with meals. Patient taking differently: Take 325 mg by mouth every evening.  07/17/15   Dunn, Nedra Hai, PA-C  folic acid (FOLVITE) 1 MG tablet Take 1 tablet (1 mg total) by mouth daily. 01/02/16   Thurnell Lose, MD  furosemide (LASIX) 80 MG tablet Take 80 mg by mouth daily.    [provider]  lactulose (CHRONULAC) 10 GM/15ML solution Take 30 mLs (20 g total) by mouth daily. 01/26/16   Florencia Reasons, MD  nitroGLYCERIN (NITROSTAT) 0.4 MG SL tablet Place 0.4 mg under the tongue every 5 (five) minutes as needed for chest pain.    [provider]  omeprazole (PRILOSEC) 40 MG capsule Take 1 capsule (40 mg total) by mouth every evening. 01/02/16   Thurnell Lose, MD  potassium chloride SA (K-DUR,KLOR-CON) 20 MEQ tablet Take 20 mEq by mouth every morning.     [provider]  ranitidine (ZANTAC) 150 MG tablet Take 150 mg by mouth 2 (two) times daily.    [provider]  rifaximin (XIFAXAN) 550 MG TABS tablet Take 1 tablet (  550 mg total) by mouth 2 (two) times daily. Patient not taking: Reported on 03/29/2017 01/26/16   Florencia Reasons, MD  spironolactone (ALDACTONE) 50 MG tablet Take 1 tablet (50 mg total) by mouth daily. 04/17/17   Minus Liberty, MD  thiamine 100 MG tablet Take 1 tablet (100 mg total) by mouth daily. 01/02/16   Thurnell Lose, MD  traMADol (ULTRAM) 50 MG tablet Take 50 mg by mouth every 6 (six) hours as needed (for pain).    [provider]    Family History Family History  Problem Relation Age of Onset  . Liver cancer Mother   . Diabetes Mellitus II Sister     Social History Social History   Tobacco Use  . Smoking status: Former Smoker    Packs/day: 2.00    Years: 40.00    Pack years: 80.00    Types: Cigarettes    Last attempt to quit: 08/14/2008    Years since quitting: 9.3  . Smokeless tobacco: Never Used    Substance Use Topics  . Alcohol use: Yes    Alcohol/week: 0.0 oz    Comment: 1 pint whiskey daily several beer weekly    01/2016  " no alcohol in 3 weeks "  . Drug use: No     Allergies   Penicillins   Review of Systems Review of Systems  Constitutional: Positive for fatigue. Negative for chills, diaphoresis and fever.  HENT: Negative for congestion and rhinorrhea.   Respiratory: Negative for cough, chest tightness, shortness of breath, wheezing and stridor.   Cardiovascular: Positive for leg swelling. Negative for chest pain and palpitations.  Gastrointestinal: Positive for nausea. Negative for abdominal pain, constipation, diarrhea and vomiting.  Genitourinary: Negative for dysuria and flank pain.  Musculoskeletal: Negative for back pain, neck pain and neck stiffness.  Skin: Negative for rash and wound.  Neurological: Positive for light-headedness.  Psychiatric/Behavioral: Positive for confusion. Negative for agitation.  All other systems reviewed and are negative.    Physical Exam Updated Vital Signs There were no vitals taken for this visit.  Physical Exam  Constitutional: He is oriented to person, place, and time. He appears well-developed and well-nourished. No distress.  HENT:  Head: Normocephalic and atraumatic.  Nose: Nose normal.  Mouth/Throat: Oropharynx is clear and moist. No oropharyngeal exudate.  Eyes: Conjunctivae and EOM are normal. Pupils are equal, round, and reactive to light.  Neck: Normal range of motion.  Cardiovascular: Normal rate and intact distal pulses.  No murmur heard. Pulmonary/Chest: Effort normal and breath sounds normal. No respiratory distress. He has no wheezes. He has no rales. He exhibits no tenderness.  Abdominal: He exhibits no distension and no mass. There is no tenderness.  Musculoskeletal: He exhibits edema. He exhibits no tenderness or deformity.  Neurological: He is alert and oriented to person, place, and time. He is not  disoriented. No cranial nerve deficit or sensory deficit. He exhibits normal muscle tone. He displays no seizure activity. Coordination normal. GCS eye subscore is 4. GCS verbal subscore is 5. GCS motor subscore is 6.  Skin: Skin is warm. Capillary refill takes less than 2 seconds. He is not diaphoretic. No erythema. No pallor.  Psychiatric: He has a normal mood and affect.  Nursing note and vitals reviewed.    ED Treatments / Results  Labs (all labs ordered are listed, but only abnormal results are displayed) Labs Reviewed  CBC WITH DIFFERENTIAL/PLATELET - Abnormal; Notable for the following components:      Result  Value   Platelets 86 (*)    All other components within normal limits  COMPREHENSIVE METABOLIC PANEL - Abnormal; Notable for the following components:   Chloride 112 (*)    CO2 16 (*)    Glucose, Bld 121 (*)    Total Protein 6.2 (*)    Albumin 3.3 (*)    Total Bilirubin 2.3 (*)    All other components within normal limits  AMMONIA - Abnormal; Notable for the following components:   Ammonia 141 (*)    All other components within normal limits  PROTIME-INR - Abnormal; Notable for the following components:   Prothrombin Time 16.1 (*)    All other components within normal limits  URINALYSIS, ROUTINE W REFLEX MICROSCOPIC - Abnormal; Notable for the following components:   Glucose, UA >=500 (*)    Bacteria, UA RARE (*)    Squamous Epithelial / LPF 0-5 (*)    All other components within normal limits  BRAIN NATRIURETIC PEPTIDE - Abnormal; Notable for the following components:   B Natriuretic Peptide 146.1 (*)    All other components within normal limits  I-STAT CG4 LACTIC ACID, ED - Abnormal; Notable for the following components:   Lactic Acid, Venous 2.57 (*)    All other components within normal limits  URINE CULTURE  LIPASE, BLOOD  PROTIME-INR  I-STAT TROPONIN, ED  I-STAT CG4 LACTIC ACID, ED  POC OCCULT BLOOD, ED    EKG  EKG  Interpretation  Date/Time:  Friday January 03 2018 17:35:19 EST Ventricular Rate:  98 PR Interval:  172 QRS Duration: 96 QT Interval:  394 QTC Calculation: 503 R Axis:   -10 Text Interpretation:  Normal sinus rhythm Right atrial enlargement Nonspecific ST abnormality Prolonged QT Abnormal ECG When compared to prior,  slightly longer Qtc.  No STEMI Confirmed by Antony Blackbird [phone]) on 01/03/2018 6:08:37 PM       Radiology Dg Chest 2 View  Result Date: 01/03/2018 CLINICAL DATA:  Fatigue and AMS. Patient arrived via [facility]. Patient's wife called stating that patient was confused. She reports that he has a history of Cirrhosis and "gets like this sometimes" she believes it his ammonia. Hx of HTN and COPD. Former smoker. EXAM: CHEST  2 VIEW COMPARISON:  04/11/2017 FINDINGS: There has been significant improvement in aeration since the most recent exams. There are prominent interstitial markings throughout the lungs without focal consolidations. Within the right upper lobe, there is focal opacity raising the question of a pulmonary mass. Consider further evaluation with CT. There is midthoracic spondylosis. IMPRESSION: 1. Prominent interstitial markings and bronchitic changes. 2. Question of right upper lobe mass warranting further evaluation. CT of the chest is recommended. Intravenous contrast is recommended unless contraindicated. Electronically Signed   By: Nolon Nations M.D.   On: 01/03/2018 18:08   Ct Head Wo Contrast  Result Date: 01/03/2018 CLINICAL DATA:  Altered level of consciousness. EXAM: CT HEAD WITHOUT CONTRAST TECHNIQUE: Contiguous axial images were obtained from the base of the skull through the vertex without intravenous contrast. COMPARISON:  MRI 12/17/2008 FINDINGS: Brain: There is atrophy and chronic small vessel disease changes. No acute intracranial abnormality. Specifically, no hemorrhage, hydrocephalus, mass lesion, acute infarction, or significant intracranial injury.  Vascular: No hyperdense vessel or unexpected calcification. Skull: No acute calvarial abnormality. Sinuses/Orbits: Visualized paranasal sinuses and mastoids clear. Orbital soft tissues unremarkable. Other: None IMPRESSION: No acute intracranial abnormality. Atrophy, chronic microvascular disease. Electronically Signed   By: Rolm Baptise M.D.   On: 01/03/2018 18:39  Ct Chest Wo Contrast  Result Date: 01/03/2018 CLINICAL DATA:  Possible lung mass. EXAM: CT CHEST WITHOUT CONTRAST TECHNIQUE: Multidetector CT imaging of the chest was performed following the standard protocol without IV contrast. COMPARISON:  Radiograph of same day. FINDINGS: Cardiovascular: Atherosclerosis of thoracic aorta is noted without aneurysm formation. Normal cardiac size. No pericardial effusion. Coronary artery calcifications are noted. Mediastinum/Nodes: No enlarged mediastinal or axillary lymph nodes. Thyroid gland, trachea, and esophagus demonstrate no significant findings. Lungs/Pleura: No pneumothorax or pleural effusion is noted. Emphysematous disease is noted in the upper lobes bilaterally. Mild biapical scarring is noted. No acute pulmonary disease is noted. Upper Abdomen: No acute abnormality. Musculoskeletal: No chest wall mass or suspicious bone lesions identified. IMPRESSION: Coronary artery calcifications are noted suggesting coronary artery disease. No acute pulmonary abnormality is noted. TIPS stent is seen in the liver. Aortic Atherosclerosis (ICD10-I70.0) and Emphysema (ICD10-J43.9). Electronically Signed   By: Marijo Conception, M.D.   On: 01/03/2018 20:42    Procedures Procedures (including critical care time)  Medications Ordered in ED Medications  iopamidol (ISOVUE-300) 61 % injection (not administered)  acetaminophen (TYLENOL) tablet 500-1,000 mg (not administered)  aspirin EC tablet 81 mg (not administered)  atorvastatin (LIPITOR) tablet 20 mg (not administered)  ferrous sulfate tablet 325 mg (not  administered)  folic acid (FOLVITE) tablet 1 mg (not administered)  furosemide (LASIX) tablet 80 mg (not administered)  lactulose (CHRONULAC) 10 GM/15ML solution 30 g (30 g Oral Given 01/04/18 0038)  pantoprazole (PROTONIX) EC tablet 40 mg (not administered)  thiamine (VITAMIN B-1) tablet 100 mg (not administered)  spironolactone (ALDACTONE) tablet 50 mg (not administered)  traMADol (ULTRAM) tablet 50 mg (not administered)  nitroGLYCERIN (NITROSTAT) SL tablet 0.4 mg (not administered)  sodium chloride flush (NS) 0.9 % injection 3 mL (not administered)  sodium chloride flush (NS) 0.9 % injection 3 mL (not administered)  0.9 %  sodium chloride infusion (not administered)  HYDROcodone-acetaminophen (NORCO/VICODIN) 5-325 MG per tablet 1-2 tablet (not administered)  ondansetron (ZOFRAN) tablet 4 mg (not administered)    Or  ondansetron (ZOFRAN) injection 4 mg (not administered)  zolpidem (AMBIEN) tablet 5 mg (not administered)  ipratropium-albuterol (DUONEB) 0.5-2.5 (3) MG/3ML nebulizer solution 3 mL (not administered)     Initial Impression / Assessment and Plan / ED Course  I have reviewed the triage vital signs and the nursing notes.  Pertinent labs & imaging results that were available during my care of the patient were reviewed by me and considered in my medical decision making (see chart for details).     John Warren is a 67 y.o. male with a past medical history significant for COPD, prior diverticulosis, cirrhosis with ascites and esophageal varices, CHF, CAD with prior NSTEMI, and multiple prior GI bleeds who presents with altered mental status, fatigue, and dark stools.  Patient reports that he noticed black bowel movement yesterday which was last p.m.  He reports feeling very tired and fatigued and feels like he is having a GI bleed.  He denies nausea or vomiting at this time as well as any pain.  He denies chest pain, palpitations, shortness of breath, nausea, vomiting, or abdominal  pain.  He denies recent trauma.  He denies any falls or head injuries.  He does report feeling lightheaded and confused.  He is alert and oriented on arrival but is slow with speech.  On exam, abdomen is nontender.  Lungs are clear.  Patient has significant edema in his lower extremities which he reports  is worsened over the last week.  He has no focal neurologic deficits on my initial evaluation.  Patient has normal extraocular movements and symmetric pupils.  Next   Patient will have workup to look for occult infection, electrolyte abnormalities, and GI bleed.  Type and screen will be sent.  On arrival, patient was slightly tachycardic.   Anticipate reassessment and likely admission after workup is completed.   Diagnostic laboratory testing results seen above.  Lactic acid is elevated however that improved on reassessment.  Urinalysis shows no infection troponin negative however patient was found to have elevated ammonia of greater than 140.  I suspect patient's symptoms of fatigue and altered mental status are likely due to hepatic encephalopathy and hyperammonemia.  Next  When patient was reassessed, family reports that they have been making changes with his lactulose amounts.  Patient reports that he had decreased lactulose intake because he was having diarrhea and fluid loss.  A discussion was held with family about their comfort with his safety going home with increasing his lactulose given the hepatic encephalopathy and patient does not feel safe going home given his difficulty ambulating, confusion, and difficulty carrying on a coherent conversation.  Patient's wife agreed with admission for treatment of hepatic encephalopathy and monitoring of his hydration status.  Hospitalist team will be called for admission for further management.   Final Clinical Impressions(s) / ED Diagnoses   Final diagnoses:  Altered mental status, unspecified altered mental status type  Hepatic  encephalopathy (HCC)  Increased ammonia level    ED Discharge Orders    None      Clinical Impression: 1. Altered mental status, unspecified altered mental status type   2. Hepatic encephalopathy ([place])   3. Increased ammonia level     Disposition: Admit  This note was prepared with assistance of Dragon voice recognition software. Occasional wrong-word or sound-a-like substitutions may have occurred due to the inherent limitations of voice recognition software.     Tegeler, Gwenyth Allegra, MD 01/04/18 [phone]

## 2018-01-03 NOTE — ED Notes (Addendum)
Patient's wife and daughter at bedside

## 2018-01-03 NOTE — ED Triage Notes (Signed)
Patient arrived via [facility]. Patient's wife called stating that patient was confused. She reports that he has a history of Cirrhosis and "gets like this sometimes" she believes it his ammonia

## 2018-01-03 NOTE — ED Notes (Signed)
Pt returned from CT °

## 2018-01-04 DIAGNOSIS — K7290 Hepatic failure, unspecified without coma: Secondary | ICD-10-CM | POA: Diagnosis not present

## 2018-01-04 LAB — BASIC METABOLIC PANEL
ANION GAP: 8 (ref 5–15)
BUN: 11 mg/dL (ref 6–20)
CALCIUM: 9.1 mg/dL (ref 8.9–10.3)
CO2: 18 mmol/L — AB (ref 22–32)
Chloride: 110 mmol/L (ref 101–111)
Creatinine, Ser: 0.98 mg/dL (ref 0.61–1.24)
GFR calc Af Amer: >60 mL/min (ref >60)
GLUCOSE: 213 mg/dL — AB (ref 65–99)
Potassium: 3.8 mmol/L (ref 3.5–5.1)
Sodium: 136 mmol/L (ref 135–145)

## 2018-01-04 LAB — PROTIME-INR
INR: 1.36
PROTHROMBIN TIME: 16.7 s — AB (ref 11.4–15.2)

## 2018-01-04 LAB — AMMONIA: Ammonia: 109 umol/L — ABNORMAL HIGH (ref 9–35)

## 2018-01-04 MED ORDER — PNEUMOCOCCAL VAC POLYVALENT 25 MCG/0.5ML IJ INJ
0.5000 mL | INJECTION | INTRAMUSCULAR | Status: AC
  Administered 2018-01-04: 0.5 mL via INTRAMUSCULAR
  Filled 2018-01-04: qty 0.5

## 2018-01-04 MED ORDER — LACTULOSE 10 GM/15ML PO SOLN: 30.0000 g | Freq: Three times a day (TID) | ORAL | 0 refills | Status: AC

## 2018-01-04 MED ORDER — SODIUM CHLORIDE 0.9 % IV BOLUS (SEPSIS)
500.0000 mL | Freq: Once | INTRAVENOUS | Status: AC
  Administered 2018-01-04: 500 mL via INTRAVENOUS

## 2018-01-04 MED ORDER — IPRATROPIUM-ALBUTEROL 0.5-2.5 (3) MG/3ML IN SOLN: 3.0000 mL | Freq: Four times a day (QID) | RESPIRATORY_TRACT | Status: DC | PRN

## 2018-01-04 MED ORDER — INFLUENZA VAC SPLIT HIGH-DOSE 0.5 ML IM SUSY
0.5000 mL | PREFILLED_SYRINGE | INTRAMUSCULAR | Status: DC
  Filled 2018-01-04: qty 0.5

## 2018-01-04 MED ORDER — INFLUENZA VAC SPLIT HIGH-DOSE 0.5 ML IM SUSY
0.5000 mL | PREFILLED_SYRINGE | Freq: Once | INTRAMUSCULAR | Status: AC
  Administered 2018-01-04: 0.5 mL via INTRAMUSCULAR

## 2018-01-04 NOTE — Discharge Summary (Signed)
Physician Discharge Summary  John Warren:[id number] DOB: December 17, 1950 DOA: 01/03/2018  PCP: Leonard Downing, MD  Admit date: 01/03/2018 Discharge date: 01/04/2018  Time spent: 35 minutes  Recommendations for Outpatient Follow-up:   1. See pcp in 7 days and review lactulose regimen 2. Discuss resuming beta blocker  Discharge Diagnoses:  Active Problems:   Hepatic encephalopathy ([place])   Discharge Condition: stable  Diet recommendation: heart healthy  Filed Weights   01/03/18 2347  Weight: 93.8 kg (206 lb 12.7 oz)    History of present illness:  John Warren  is a 67 y.o. male, with past medical history significant for liver cirrhosis, status post TIPS, AVM of the colon, anemia , history of COPD presenting with few days history of increasing confusion. CT of the head was negative but his ammonia level was elevated. Patient taking lactulose 30 g by mouth daily since he has a "violent"reaction to high doses of lactulose . Patient is pleasantly confused, denies any chest pains, headaches, dizziness of loss of consciousness. Beta blocker stopped due to BP issues.   Hospital Course:  Patient admitted for observation.  Frequency of the lactulose was given was increased.  Sedating medications were held.  Patient mental status increased during the course of his stay.  Wife reported he was back at baseline.  Procedures:  n/a  Consultations:  n/a  Discharge Exam: Vitals:   01/03/18 2347 01/04/18 0512  BP: (!) 145/74 (!) 113/56  Pulse: (!) 107 (!) 105  Resp: 20 18  Temp: 98 F (36.7 C) 97.6 F (36.4 C)  SpO2: 98% 99%    General: NAD, NCAT Cardiovascular: RRR, no MRG Respiratory: ctab, nl wob  Discharge Instructions   Discharge Instructions    Call MD for:  persistant dizziness or light-headedness   Complete by:  As directed    Call MD for:  persistant nausea and vomiting   Complete by:  As directed    Call MD for:  temperature >100.4   Complete by:  As  directed    Diet - low sodium heart healthy   Complete by:  As directed    Discharge instructions   Complete by:  As directed    See your doctor by Wednesday of next week.   Increase activity slowly   Complete by:  As directed      Allergies as of 01/04/2018      Reactions   Penicillins Rash   Has patient had a PCN reaction causing immediate rash, facial/tongue/throat swelling, SOB or lightheadedness with hypotension: Yes Has patient had a PCN reaction causing severe rash involving mucus membranes or skin necrosis: No Has patient had a PCN reaction that required hospitalization: No Has patient had a PCN reaction occurring within the last 10 years: No If all of the above answers are "NO", then may proceed with Cephalosporin use. Had CTX before      Medication List    STOP taking these medications   atenolol 25 MG tablet Commonly known as:  TENORMIN     TAKE these medications   acetaminophen 500 MG tablet Commonly known as:  TYLENOL Take 500-1,000 mg by mouth every 8 (eight) hours as needed (for pain).   aspirin EC 81 MG tablet Take 81 mg by mouth daily.   atorvastatin 20 MG tablet Commonly known as:  LIPITOR Take 1 tablet (20 mg total) by mouth daily.   ferrous sulfate 325 (65 FE) MG tablet Take 1 tablet (325 mg total) by mouth 3 (  three) times daily with meals. What changed:  when to take this   folic acid 1 MG tablet Commonly known as:  FOLVITE Take 1 tablet (1 mg total) by mouth daily.   furosemide 80 MG tablet Commonly known as:  LASIX Take 80 mg by mouth daily.   lactulose 10 GM/15ML solution Commonly known as:  CHRONULAC Take 45 mLs (30 g total) by mouth 3 (three) times daily. What changed:    how much to take  when to take this   nitroGLYCERIN 0.4 MG SL tablet Commonly known as:  NITROSTAT Place 0.4 mg under the tongue every 5 (five) minutes as needed for chest pain.   omeprazole 40 MG capsule Commonly known as:  PRILOSEC Take 1 capsule (40 mg  total) by mouth every evening.   potassium chloride SA 20 MEQ tablet Commonly known as:  K-DUR,KLOR-CON Take 20 mEq by mouth every morning.   ranitidine 150 MG tablet Commonly known as:  ZANTAC Take 150 mg by mouth 2 (two) times daily.   rifaximin 550 MG Tabs tablet Commonly known as:  XIFAXAN Take 1 tablet (550 mg total) by mouth 2 (two) times daily.   spironolactone 50 MG tablet Commonly known as:  ALDACTONE Take 1 tablet (50 mg total) by mouth daily.   thiamine 100 MG tablet Take 1 tablet (100 mg total) by mouth daily. What changed:  how much to take   traMADol 50 MG tablet Commonly known as:  ULTRAM Take 50 mg by mouth every 6 (six) hours as needed (for pain).      Allergies  Allergen Reactions  . Penicillins Rash    Has patient had a PCN reaction causing immediate rash, facial/tongue/throat swelling, SOB or lightheadedness with hypotension: Yes Has patient had a PCN reaction causing severe rash involving mucus membranes or skin necrosis: No Has patient had a PCN reaction that required hospitalization: No Has patient had a PCN reaction occurring within the last 10 years: No If all of the above answers are "NO", then may proceed with Cephalosporin use. Had CTX before      The results of significant diagnostics from this hospitalization (including imaging, microbiology, ancillary and laboratory) are listed below for reference.    Significant Diagnostic Studies: Dg Chest 2 View  Result Date: 01/03/2018 CLINICAL DATA:  Fatigue and AMS. Patient arrived via [facility]. Patient's wife called stating that patient was confused. She reports that he has a history of Cirrhosis and "gets like this sometimes" she believes it his ammonia. Hx of HTN and COPD. Former smoker. EXAM: CHEST  2 VIEW COMPARISON:  04/11/2017 FINDINGS: There has been significant improvement in aeration since the most recent exams. There are prominent interstitial markings throughout the lungs without focal  consolidations. Within the right upper lobe, there is focal opacity raising the question of a pulmonary mass. Consider further evaluation with CT. There is midthoracic spondylosis. IMPRESSION: 1. Prominent interstitial markings and bronchitic changes. 2. Question of right upper lobe mass warranting further evaluation. CT of the chest is recommended. Intravenous contrast is recommended unless contraindicated. Electronically Signed   By: Nolon Nations M.D.   On: 01/03/2018 18:08   Ct Head Wo Contrast  Result Date: 01/03/2018 CLINICAL DATA:  Altered level of consciousness. EXAM: CT HEAD WITHOUT CONTRAST TECHNIQUE: Contiguous axial images were obtained from the base of the skull through the vertex without intravenous contrast. COMPARISON:  MRI 12/17/2008 FINDINGS: Brain: There is atrophy and chronic small vessel disease changes. No acute intracranial abnormality. Specifically,  no hemorrhage, hydrocephalus, mass lesion, acute infarction, or significant intracranial injury. Vascular: No hyperdense vessel or unexpected calcification. Skull: No acute calvarial abnormality. Sinuses/Orbits: Visualized paranasal sinuses and mastoids clear. Orbital soft tissues unremarkable. Other: None IMPRESSION: No acute intracranial abnormality. Atrophy, chronic microvascular disease. Electronically Signed   By: Rolm Baptise M.D.   On: 01/03/2018 18:39   Ct Chest Wo Contrast  Result Date: 01/03/2018 CLINICAL DATA:  Possible lung mass. EXAM: CT CHEST WITHOUT CONTRAST TECHNIQUE: Multidetector CT imaging of the chest was performed following the standard protocol without IV contrast. COMPARISON:  Radiograph of same day. FINDINGS: Cardiovascular: Atherosclerosis of thoracic aorta is noted without aneurysm formation. Normal cardiac size. No pericardial effusion. Coronary artery calcifications are noted. Mediastinum/Nodes: No enlarged mediastinal or axillary lymph nodes. Thyroid gland, trachea, and esophagus demonstrate no significant  findings. Lungs/Pleura: No pneumothorax or pleural effusion is noted. Emphysematous disease is noted in the upper lobes bilaterally. Mild biapical scarring is noted. No acute pulmonary disease is noted. Upper Abdomen: No acute abnormality. Musculoskeletal: No chest wall mass or suspicious bone lesions identified. IMPRESSION: Coronary artery calcifications are noted suggesting coronary artery disease. No acute pulmonary abnormality is noted. TIPS stent is seen in the liver. Aortic Atherosclerosis (ICD10-I70.0) and Emphysema (ICD10-J43.9). Electronically Signed   By: Marijo Conception, M.D.   On: 01/03/2018 20:42    Microbiology: No results found for this or any previous visit (from the past 240 hour(s)).   Labs: Basic Metabolic Panel: Recent Labs  Lab 01/03/18 1747 01/04/18 0904  NA 140 136  K 3.9 3.8  CL 112* 110  CO2 16* 18*  GLUCOSE 121* 213*  BUN 11 11  CREATININE 0.87 0.98  CALCIUM 9.0 9.1   Liver Function Tests: Recent Labs  Lab 01/03/18 1747  AST 36  ALT 20  ALKPHOS 104  BILITOT 2.3*  PROT 6.2*  ALBUMIN 3.3*   Recent Labs  Lab 01/03/18 1747  LIPASE 36   Recent Labs  Lab 01/03/18 1747 01/04/18 0904  AMMONIA 141* 109*   CBC: Recent Labs  Lab 01/03/18 1747  WBC 8.9  NEUTROABS 6.5  HGB 16.3  HCT 46.9  MCV 92.9  PLT 86*   Cardiac Enzymes: No results for input(s): CKTOTAL, CKMB, CKMBINDEX, TROPONINI in the last 168 hours. BNP: BNP (last 3 results) Recent Labs    04/04/17 1530 01/03/18 1747  BNP 96.1 146.1*    ProBNP (last 3 results) No results for input(s): PROBNP in the last 8760 hours.  CBG: No results for input(s): GLUCAP in the last 168 hours.     Signed:  Elwin Mocha MD  FACP  [facility] 01/04/2018, 12:51 PM

## 2018-01-05 LAB — URINE CULTURE: Culture: NO GROWTH

## 2018-04-25 ENCOUNTER — Encounter (HOSPITAL_COMMUNITY): Payer: Self-pay | Admitting: Emergency Medicine

## 2018-04-25 ENCOUNTER — Emergency Department (HOSPITAL_COMMUNITY): Payer: Medicare Other

## 2018-04-25 ENCOUNTER — Observation Stay (HOSPITAL_COMMUNITY)
Admission: EM | Admit: 2018-04-25 | Discharge: 2018-04-26 | Disposition: A | Discharge status: Home / Self Care | Payer: Medicare Other | Attending: Family Medicine | Admitting: Family Medicine

## 2018-04-25 ENCOUNTER — Observation Stay (HOSPITAL_COMMUNITY): Payer: Medicare Other

## 2018-04-25 ENCOUNTER — Other Ambulatory Visit: Payer: Self-pay

## 2018-04-25 DIAGNOSIS — K219 Gastro-esophageal reflux disease without esophagitis: Secondary | ICD-10-CM | POA: Diagnosis not present

## 2018-04-25 DIAGNOSIS — R42 Dizziness and giddiness: Secondary | ICD-10-CM | POA: Diagnosis not present

## 2018-04-25 DIAGNOSIS — I252 Old myocardial infarction: Secondary | ICD-10-CM | POA: Diagnosis not present

## 2018-04-25 DIAGNOSIS — Z87891 Personal history of nicotine dependence: Secondary | ICD-10-CM | POA: Diagnosis not present

## 2018-04-25 DIAGNOSIS — D696 Thrombocytopenia, unspecified: Secondary | ICD-10-CM | POA: Diagnosis not present

## 2018-04-25 DIAGNOSIS — Z800 Family history of malignant neoplasm of digestive organs: Secondary | ICD-10-CM | POA: Insufficient documentation

## 2018-04-25 DIAGNOSIS — I471 Supraventricular tachycardia: Secondary | ICD-10-CM | POA: Insufficient documentation

## 2018-04-25 DIAGNOSIS — R2689 Other abnormalities of gait and mobility: Secondary | ICD-10-CM | POA: Diagnosis not present

## 2018-04-25 DIAGNOSIS — K3189 Other diseases of stomach and duodenum: Secondary | ICD-10-CM | POA: Diagnosis not present

## 2018-04-25 DIAGNOSIS — K766 Portal hypertension: Secondary | ICD-10-CM | POA: Insufficient documentation

## 2018-04-25 DIAGNOSIS — B9689 Other specified bacterial agents as the cause of diseases classified elsewhere: Secondary | ICD-10-CM | POA: Insufficient documentation

## 2018-04-25 DIAGNOSIS — I700 Atherosclerosis of aorta: Secondary | ICD-10-CM | POA: Insufficient documentation

## 2018-04-25 DIAGNOSIS — E785 Hyperlipidemia, unspecified: Secondary | ICD-10-CM | POA: Diagnosis not present

## 2018-04-25 DIAGNOSIS — K7682 Hepatic encephalopathy: Secondary | ICD-10-CM | POA: Diagnosis present

## 2018-04-25 DIAGNOSIS — Z7982 Long term (current) use of aspirin: Secondary | ICD-10-CM | POA: Insufficient documentation

## 2018-04-25 DIAGNOSIS — K7030 Alcoholic cirrhosis of liver without ascites: Secondary | ICD-10-CM | POA: Insufficient documentation

## 2018-04-25 DIAGNOSIS — I5042 Chronic combined systolic (congestive) and diastolic (congestive) heart failure: Secondary | ICD-10-CM | POA: Diagnosis not present

## 2018-04-25 DIAGNOSIS — K7290 Hepatic failure, unspecified without coma: Principal | ICD-10-CM | POA: Insufficient documentation

## 2018-04-25 DIAGNOSIS — Z79899 Other long term (current) drug therapy: Secondary | ICD-10-CM | POA: Insufficient documentation

## 2018-04-25 DIAGNOSIS — R4182 Altered mental status, unspecified: Secondary | ICD-10-CM

## 2018-04-25 DIAGNOSIS — D649 Anemia, unspecified: Secondary | ICD-10-CM | POA: Insufficient documentation

## 2018-04-25 DIAGNOSIS — I2510 Atherosclerotic heart disease of native coronary artery without angina pectoris: Secondary | ICD-10-CM | POA: Insufficient documentation

## 2018-04-25 DIAGNOSIS — J019 Acute sinusitis, unspecified: Secondary | ICD-10-CM | POA: Diagnosis not present

## 2018-04-25 DIAGNOSIS — I110 Hypertensive heart disease with heart failure: Secondary | ICD-10-CM | POA: Insufficient documentation

## 2018-04-25 DIAGNOSIS — I6782 Cerebral ischemia: Secondary | ICD-10-CM | POA: Diagnosis not present

## 2018-04-25 DIAGNOSIS — K449 Diaphragmatic hernia without obstruction or gangrene: Secondary | ICD-10-CM | POA: Insufficient documentation

## 2018-04-25 DIAGNOSIS — I8500 Esophageal varices without bleeding: Secondary | ICD-10-CM | POA: Diagnosis not present

## 2018-04-25 DIAGNOSIS — Z86010 Personal history of colonic polyps: Secondary | ICD-10-CM | POA: Insufficient documentation

## 2018-04-25 DIAGNOSIS — Z880 Allergy status to penicillin: Secondary | ICD-10-CM | POA: Diagnosis not present

## 2018-04-25 DIAGNOSIS — J449 Chronic obstructive pulmonary disease, unspecified: Secondary | ICD-10-CM | POA: Diagnosis not present

## 2018-04-25 LAB — URINALYSIS, ROUTINE W REFLEX MICROSCOPIC
Bilirubin Urine: NEGATIVE
Glucose, UA: NEGATIVE mg/dL
HGB URINE DIPSTICK: NEGATIVE
KETONES UR: NEGATIVE mg/dL
Leukocytes, UA: NEGATIVE
NITRITE: NEGATIVE
PROTEIN: NEGATIVE mg/dL
SPECIFIC GRAVITY, URINE: 1.026 (ref 1.005–1.030)
pH: 7 (ref 5.0–8.0)

## 2018-04-25 LAB — CREATININE, SERUM
CREATININE: 0.98 mg/dL (ref 0.61–1.24)
GFR calc non Af Amer: >60 mL/min (ref >60)

## 2018-04-25 LAB — COMPREHENSIVE METABOLIC PANEL
ALBUMIN: 3.2 g/dL — AB (ref 3.5–5.0)
ALK PHOS: 97 U/L (ref 38–126)
ALT: 24 U/L (ref 17–63)
ANION GAP: 8 (ref 5–15)
AST: 30 U/L (ref 15–41)
BUN: 27 mg/dL — ABNORMAL HIGH (ref 6–20)
CALCIUM: 9 mg/dL (ref 8.9–10.3)
CO2: 22 mmol/L (ref 22–32)
Chloride: 110 mmol/L (ref 101–111)
Creatinine, Ser: 1 mg/dL (ref 0.61–1.24)
GFR calc non Af Amer: >60 mL/min (ref >60)
GLUCOSE: 160 mg/dL — AB (ref 65–99)
POTASSIUM: 4.3 mmol/L (ref 3.5–5.1)
SODIUM: 140 mmol/L (ref 135–145)
Total Bilirubin: 1.4 mg/dL — ABNORMAL HIGH (ref 0.3–1.2)
Total Protein: 6.1 g/dL — ABNORMAL LOW (ref 6.5–8.1)

## 2018-04-25 LAB — RAPID URINE DRUG SCREEN, HOSP PERFORMED
Amphetamines: NOT DETECTED
BENZODIAZEPINES: NOT DETECTED
Barbiturates: NOT DETECTED
COCAINE: NOT DETECTED
OPIATES: NOT DETECTED
TETRAHYDROCANNABINOL: NOT DETECTED

## 2018-04-25 LAB — CBC
HCT: 31.5 % — ABNORMAL LOW (ref 39.0–52.0)
HEMATOCRIT: 37.8 % — AB (ref 39.0–52.0)
HEMOGLOBIN: 12.7 g/dL — AB (ref 13.0–17.0)
HEMOGLOBIN: 10.5 g/dL — AB (ref 13.0–17.0)
MCH: 31.4 pg (ref 26.0–34.0)
MCH: 31.3 pg (ref 26.0–34.0)
MCHC: 33.6 g/dL (ref 30.0–36.0)
MCHC: 33.3 g/dL (ref 30.0–36.0)
MCV: 93.3 fL (ref 78.0–100.0)
MCV: 93.8 fL (ref 78.0–100.0)
Platelets: 160 10*3/uL (ref 150–400)
Platelets: 130 10*3/uL — ABNORMAL LOW (ref 150–400)
RBC: 4.05 MIL/uL — AB (ref 4.22–5.81)
RBC: 3.36 MIL/uL — AB (ref 4.22–5.81)
RDW: 14 % (ref 11.5–15.5)
RDW: 14.2 % (ref 11.5–15.5)
WBC: 14.2 10*3/uL — ABNORMAL HIGH (ref 4.0–10.5)
WBC: 9.2 10*3/uL (ref 4.0–10.5)

## 2018-04-25 LAB — PHOSPHORUS: PHOSPHORUS: 3.3 mg/dL (ref 2.5–4.6)

## 2018-04-25 LAB — AMMONIA: Ammonia: 52 umol/L — ABNORMAL HIGH (ref 9–35)

## 2018-04-25 LAB — ETHANOL

## 2018-04-25 LAB — CBG MONITORING, ED: GLUCOSE-CAPILLARY: 142 mg/dL — AB (ref 65–99)

## 2018-04-25 LAB — MAGNESIUM: MAGNESIUM: 2 mg/dL (ref 1.7–2.4)

## 2018-04-25 MED ORDER — ONDANSETRON HCL 4 MG/2ML IJ SOLN: 4.0000 mg | Freq: Four times a day (QID) | INTRAMUSCULAR | Status: DC | PRN

## 2018-04-25 MED ORDER — RIFAXIMIN 550 MG PO TABS
550.0000 mg | ORAL_TABLET | Freq: Two times a day (BID) | ORAL | Status: DC
  Administered 2018-04-25 – 2018-04-26 (×2): 550 mg via ORAL
  Filled 2018-04-25 (×2): qty 1

## 2018-04-25 MED ORDER — FOLIC ACID 1 MG PO TABS
1.0000 mg | ORAL_TABLET | Freq: Every day | ORAL | Status: DC
  Administered 2018-04-25 – 2018-04-26 (×2): 1 mg via ORAL
  Filled 2018-04-25 (×2): qty 1

## 2018-04-25 MED ORDER — FERROUS SULFATE 325 (65 FE) MG PO TABS
325.0000 mg | ORAL_TABLET | Freq: Two times a day (BID) | ORAL | Status: DC
  Administered 2018-04-26: 325 mg via ORAL
  Filled 2018-04-25: qty 1

## 2018-04-25 MED ORDER — LACTULOSE 10 GM/15ML PO SOLN
30.0000 g | Freq: Three times a day (TID) | ORAL | Status: DC
  Administered 2018-04-26: 30 g via ORAL
  Filled 2018-04-25 (×2): qty 45

## 2018-04-25 MED ORDER — ASPIRIN EC 81 MG PO TBEC
81.0000 mg | DELAYED_RELEASE_TABLET | Freq: Every day | ORAL | Status: DC
  Administered 2018-04-25 – 2018-04-26 (×2): 81 mg via ORAL
  Filled 2018-04-25 (×2): qty 1

## 2018-04-25 MED ORDER — SODIUM CHLORIDE 0.9 % IV SOLN
INTRAVENOUS | Status: DC
  Administered 2018-04-25: 21:00:00 via INTRAVENOUS

## 2018-04-25 MED ORDER — MAGNESIUM CITRATE PO SOLN: 1.0000 | Freq: Once | ORAL | Status: DC | PRN

## 2018-04-25 MED ORDER — ONDANSETRON HCL 4 MG PO TABS: 4.0000 mg | ORAL_TABLET | Freq: Four times a day (QID) | ORAL | Status: DC | PRN

## 2018-04-25 MED ORDER — FAMOTIDINE 20 MG PO TABS
20.0000 mg | ORAL_TABLET | Freq: Every day | ORAL | Status: DC
  Administered 2018-04-25 – 2018-04-26 (×2): 20 mg via ORAL
  Filled 2018-04-25 (×2): qty 1

## 2018-04-25 MED ORDER — SENNOSIDES-DOCUSATE SODIUM 8.6-50 MG PO TABS: 1.0000 | ORAL_TABLET | Freq: Every evening | ORAL | Status: DC | PRN

## 2018-04-25 MED ORDER — ENOXAPARIN SODIUM 40 MG/0.4ML ~~LOC~~ SOLN
40.0000 mg | SUBCUTANEOUS | Status: DC
  Administered 2018-04-25: 40 mg via SUBCUTANEOUS
  Filled 2018-04-25: qty 0.4

## 2018-04-25 MED ORDER — VITAMIN B-1 100 MG PO TABS
250.0000 mg | ORAL_TABLET | Freq: Every day | ORAL | Status: DC
  Administered 2018-04-25 – 2018-04-26 (×2): 250 mg via ORAL
  Filled 2018-04-25 (×4): qty 3

## 2018-04-25 MED ORDER — ATORVASTATIN CALCIUM 20 MG PO TABS
20.0000 mg | ORAL_TABLET | Freq: Every day | ORAL | Status: DC
  Administered 2018-04-25 – 2018-04-26 (×2): 20 mg via ORAL
  Filled 2018-04-25 (×2): qty 1

## 2018-04-25 MED ORDER — NITROGLYCERIN 0.4 MG SL SUBL: 0.4000 mg | SUBLINGUAL_TABLET | SUBLINGUAL | Status: DC | PRN

## 2018-04-25 MED ORDER — ACETAMINOPHEN 500 MG PO TABS: 500.0000 mg | ORAL_TABLET | Freq: Three times a day (TID) | ORAL | Status: DC | PRN

## 2018-04-25 MED ORDER — FUROSEMIDE 80 MG PO TABS
80.0000 mg | ORAL_TABLET | Freq: Every day | ORAL | Status: DC
  Administered 2018-04-25 – 2018-04-26 (×2): 80 mg via ORAL
  Filled 2018-04-25 (×2): qty 1

## 2018-04-25 MED ORDER — SODIUM CHLORIDE 0.9 % IV BOLUS
1000.0000 mL | Freq: Once | INTRAVENOUS | Status: AC
  Administered 2018-04-25: 1000 mL via INTRAVENOUS

## 2018-04-25 MED ORDER — PANTOPRAZOLE SODIUM 40 MG PO TBEC
40.0000 mg | DELAYED_RELEASE_TABLET | Freq: Every day | ORAL | Status: DC
  Administered 2018-04-25 – 2018-04-26 (×2): 40 mg via ORAL
  Filled 2018-04-25 (×2): qty 1

## 2018-04-25 MED ORDER — POTASSIUM CHLORIDE CRYS ER 20 MEQ PO TBCR
20.0000 meq | EXTENDED_RELEASE_TABLET | Freq: Every morning | ORAL | Status: DC
  Administered 2018-04-26: 20 meq via ORAL
  Filled 2018-04-25: qty 1

## 2018-04-25 MED ORDER — LACTULOSE 10 GM/15ML PO SOLN
20.0000 g | Freq: Once | ORAL | Status: DC
  Filled 2018-04-25: qty 30

## 2018-04-25 MED ORDER — SPIRONOLACTONE 25 MG PO TABS
50.0000 mg | ORAL_TABLET | Freq: Every day | ORAL | Status: DC
  Administered 2018-04-25 – 2018-04-26 (×2): 50 mg via ORAL
  Filled 2018-04-25 (×2): qty 2

## 2018-04-25 MED ORDER — BISACODYL 5 MG PO TBEC: 5.0000 mg | DELAYED_RELEASE_TABLET | Freq: Every day | ORAL | Status: DC | PRN

## 2018-04-25 NOTE — H&P (Signed)
History and Physical   [facility] - [place] @ [place] Admission History and Physical [employer], D.O.    Patient Name: John Warren MR#: [id number] Date of Birth: Aug 07, 1951 Date of Admission: 04/25/2018  Referring MD/NP/PA: Dr. Gilford Raid Primary Care Physician: Leonard Downing, MD  Chief Complaint:  Chief Complaint  Patient presents with  . Altered Mental Status  Please note the entire history is obtained from the patient's emergency department chart, emergency department provider and the patient's family who is at the bedside. Patient's personal history is limited by altered mental status.   HPI: John Warren is a 67 y.o. male with a known history of liver cirrhosis presents to the emergency department for evaluation of AMS.  Patient was in a usual state of health until last night when wife noticed that the patient was increasingly confused similar to previous episodes of hyperammonemia.  Has been compliant with lactulose.  He has been prescribed rifaximin but has not started secondary to high copay.   Also complains of one week of nasal congestion and sinus pain associated with yellowish nasal discharged.   Patient denies fevers/chills, weakness, dizziness, chest pain, shortness of breath, N/V/C/D, abdominal pain, dysuria/frequency.   Otherwise there has been no change in status. Patient has been taking medication as prescribed and there has been no recent change in medication or diet.  No recent antibiotics.  There has been no recent illness, hospitalizations, travel or sick contacts.    EMS/ED Course: Patient received lactulose. Medical admission has been requested for further management of hepatic encephalopathy.   Review of Systems:  .Unable to obtain 2/2 AMS.  Past Medical History:  Diagnosis Date  . Alcohol abuse   . Anemia   . Arthritis   . AVM (arteriovenous malformation) of colon    a. By colonoscopy 07/2015.  Marland Kitchen Cirrhosis of liver ([place])    . Colon polyps   . COPD (chronic obstructive pulmonary disease) ([place])   . Diastolic CHF ([place])   . GERD (gastroesophageal reflux disease)   . GI bleed 01/2016  . Headache    cluster headaches  . History of blood transfusion   . History of hiatal hernia   . Hypertension   . Hypokalemia   . Internal hemorrhoids   . Portal hypertensive gastropathy ([place])    a. By EGD 07/2015.  . Sigmoid diverticulosis   . SVT (supraventricular tachycardia) ([place])    a. 07/2015 in setting of severe hypokalemia.  . Symptomatic anemia    a. 07/2015 - felt multifactorial from cirrhosis and AVMs.  . Thrombocytopenia ([place])   . Tobacco use     Past Surgical History:  Procedure Laterality Date  . CATARACT EXTRACTION    . COLONOSCOPY N/A 07/15/2015   Procedure: COLONOSCOPY;  Surgeon: Wilford Corner, MD;  Location: [facility] ENDOSCOPY;  Service: Endoscopy;  Laterality: N/A;  . COLONOSCOPY WITH PROPOFOL N/A 04/04/2017   Procedure: COLONOSCOPY WITH PROPOFOL;  Surgeon: Ronnette Juniper, MD;  Location: [place];  Service: Gastroenterology;  Laterality: N/A;  . ESOPHAGOGASTRODUODENOSCOPY N/A 07/15/2015   Procedure: ESOPHAGOGASTRODUODENOSCOPY (EGD);  Surgeon: Wilford Corner, MD;  Location[facility] ENDOSCOPY;  Service: Endoscopy;  Laterality: N/A;  . ESOPHAGOGASTRODUODENOSCOPY N/A 12/30/2015   Procedure: ESOPHAGOGASTRODUODENOSCOPY (EGD);  Surgeon: Ronald Lobo, MD;  Location[facility] ENDOSCOPY;  Service: Endoscopy;  Laterality: N/A;  . ESOPHAGOGASTRODUODENOSCOPY N/A 03/30/2017   Procedure: ESOPHAGOGASTRODUODENOSCOPY (EGD);  Surgeon: Laurence Spates, MD;  Location: [facility] ENDOSCOPY;  Service: Endoscopy;  Laterality: N/A;  . ESOPHAGOGASTRODUODENOSCOPY N/A 04/04/2017   Procedure:  ESOPHAGOGASTRODUODENOSCOPY (EGD);  Surgeon: Arta Silence, MD;  Location: [facility] ENDOSCOPY;  Service: Endoscopy;  Laterality: N/A;  . ESOPHAGOGASTRODUODENOSCOPY (EGD) WITH PROPOFOL N/A 01/24/2016   Procedure: ESOPHAGOGASTRODUODENOSCOPY (EGD) WITH PROPOFOL;  Surgeon: Wonda Horner, MD;  Location: [facility] ENDOSCOPY;  Service: Endoscopy;  Laterality: N/A;  . ESOPHAGOGASTRODUODENOSCOPY (EGD) WITH PROPOFOL N/A 09/18/2017   Procedure: ESOPHAGOGASTRODUODENOSCOPY (EGD) WITH PROPOFOL;  Surgeon: Wilford Corner, MD;  Location: [place];  Service: Endoscopy;  Laterality: N/A;  . GIVENS CAPSULE STUDY N/A 04/02/2017   Procedure: GIVENS CAPSULE STUDY;  Surgeon: Arta Silence, MD;  Location: [facility] ENDOSCOPY;  Service: Endoscopy;  Laterality: N/A;  . HERNIA REPAIR    . HOT HEMOSTASIS N/A 01/24/2016   Procedure: HOT HEMOSTASIS (ARGON PLASMA COAGULATION/BICAP);  Surgeon: Wonda Horner, MD;  Location: [facility] ENDOSCOPY;  Service: Endoscopy;  Laterality: N/A;  . IR ANGIOGRAM SELECTIVE EACH ADDITIONAL VESSEL  04/04/2017  . IR [place] ADDITIONAL VESSEL  04/04/2017  . IR EMBO ART  VEN HEMORR LYMPH EXTRAV  INC GUIDE ROADMAPPING  04/04/2017  . IR EMBO ART  VEN HEMORR LYMPH EXTRAV  INC GUIDE ROADMAPPING  04/04/2017  . IR RADIOLOGIST EVAL & MGMT  05/07/2017  . IR TIPS  04/04/2017  . IR US GUIDE VASC ACCESS RIGHT  04/04/2017  . IR VENOGRAM RENAL UNI RIGHT  04/04/2017  . RADIOLOGY WITH ANESTHESIA N/A 04/04/2017   Procedure: RADIOLOGY WITH ANESTHESIA;  Surgeon: Radiologist, Medication, MD;  Location: [place];  Service: Radiology;  Laterality: N/A;  . RIGHT/LEFT HEART CATH AND CORONARY ANGIOGRAPHY N/A 04/15/2017   Procedure: Right/Left Heart Cath and Coronary Angiography;  Surgeon: Martinique, Peter M, MD;  Location: [place] CV LAB;  Service: Cardiovascular;  Laterality: N/A;     reports that he quit smoking about 9 years ago. His smoking use included cigarettes. He has a 80.00 pack-year smoking history. He has never used smokeless tobacco. He reports that he drinks alcohol. He reports that he does not use drugs.  Allergies  Allergen Reactions  . Penicillins Rash    Has patient had a PCN reaction causing immediate rash, facial/tongue/throat swelling, SOB or lightheadedness with hypotension:  Yes Has patient had a PCN reaction causing severe rash involving mucus membranes or skin necrosis: No Has patient had a PCN reaction that required hospitalization: No Has patient had a PCN reaction occurring within the last 10 years: No If all of the above answers are "NO", then may proceed with Cephalosporin use. Had CTX before    Family History  Problem Relation Age of Onset  . Liver cancer Mother   . Diabetes Mellitus II Sister     Prior to Admission medications   Medication Sig Start Date End Date Taking? Authorizing Provider  acetaminophen (TYLENOL) 500 MG tablet Take 500-1,000 mg by mouth every 8 (eight) hours as needed (for pain).    [provider]  aspirin EC 81 MG tablet Take 81 mg by mouth daily.    [provider]  atorvastatin (LIPITOR) 20 MG tablet Take 1 tablet (20 mg total) by mouth daily. 04/17/17   Minus Liberty, MD  ferrous sulfate 325 (65 FE) MG tablet Take 1 tablet (325 mg total) by mouth 3 (three) times daily with meals. Patient taking differently: Take 325 mg by mouth 2 (two) times daily with a meal.  07/17/15   Dunn, Nedra Hai, PA-C  folic acid (FOLVITE) 1 MG tablet Take 1 tablet (1 mg total) by mouth daily. 01/02/16   Thurnell Lose, MD  furosemide (LASIX) 80 MG tablet Take 80 mg by mouth daily.    [provider]  lactulose (CHRONULAC) 10 GM/15ML solution Take 45 mLs (30 g total) by mouth 3 (three) times daily. 01/04/18   Elwin Mocha, MD  nitroGLYCERIN (NITROSTAT) 0.4 MG SL tablet Place 0.4 mg under the tongue every 5 (five) minutes as needed for chest pain.    [provider]  omeprazole (PRILOSEC) 40 MG capsule Take 1 capsule (40 mg total) by mouth every evening. 01/02/16   Thurnell Lose, MD  potassium chloride SA (K-DUR,KLOR-CON) 20 MEQ tablet Take 20 mEq by mouth every morning.     [provider]  ranitidine (ZANTAC) 150 MG tablet Take 150 mg by mouth 2 (two) times daily.    [provider]   rifaximin (XIFAXAN) 550 MG TABS tablet Take 1 tablet (550 mg total) by mouth 2 (two) times daily. Patient not taking: Reported on 03/29/2017 01/26/16   Florencia Reasons, MD  spironolactone (ALDACTONE) 50 MG tablet Take 1 tablet (50 mg total) by mouth daily. 04/17/17   Minus Liberty, MD  thiamine 100 MG tablet Take 1 tablet (100 mg total) by mouth daily. Patient taking differently: Take 250 mg by mouth daily.  01/02/16   Thurnell Lose, MD  traMADol (ULTRAM) 50 MG tablet Take 50 mg by mouth every 6 (six) hours as needed (for pain).    [provider]    Physical Exam: Vitals:   04/25/18 1652 04/25/18 1700 04/25/18 1730 04/25/18 1800  BP: 139/72 135/61 129/60 (!) 127/58  Pulse:  99 94 97  Resp: 20 (!) 23 14 17   Temp:      TempSrc:      SpO2:  100% 100% 100%    GENERAL: 67 y.o.-year-old male patient, well-developed, well-nourished lying in the bed in no acute distress.  Pleasant and cooperative.   HEENT: Head atraumatic, normocephalic. Pupils equal. Mucus membranes moist. Positive maxillary sinus tenderness bilaterally.  NECK: Supple. No JVD. CHEST: Normal breath sounds bilaterally. No wheezing, rales, rhonchi or crackles. No use of accessory muscles of respiration.  No reproducible chest wall tenderness.  CARDIOVASCULAR: S1, S2 normal. No murmurs, rubs, or gallops. Cap refill <2 seconds. Pulses intact distally.  ABDOMEN: Soft, nondistended, nontender. No rebound, guarding, rigidity. Normoactive bowel sounds present in all four quadrants.  EXTREMITIES: No pedal edema, cyanosis, or clubbing. No calf tenderness or Homan's sign.  NEUROLOGIC: The patient is alert and oriented x 3. Cranial nerves II through XII are grossly intact with no focal sensorimotor deficit. PSYCHIATRIC:  Normal affect, mood, thought content. SKIN: Warm, dry, and intact without obvious rash, lesion, or ulcer.    Labs on Admission:  CBC: Recent Labs  Lab 04/25/18 1533  WBC 14.2*  HGB 12.7*  HCT 37.8*   MCV 93.3  PLT 258   Basic Metabolic Panel: Recent Labs  Lab 04/25/18 1533  NA 140  K 4.3  CL 110  CO2 22  GLUCOSE 160*  BUN 27*  CREATININE 1.00  CALCIUM 9.0   GFR: CrCl cannot be calculated (Unknown ideal weight.). Liver Function Tests: Recent Labs  Lab 04/25/18 1533  AST 30  ALT 24  ALKPHOS 97  BILITOT 1.4*  PROT 6.1*  ALBUMIN 3.2*   No results for input(s): LIPASE, AMYLASE in the last 168 hours. Recent Labs  Lab 04/25/18 1533  AMMONIA 52*   Coagulation Profile: No results for input(s): INR, PROTIME in the last 168 hours. Cardiac Enzymes: No results for input(s):  CKTOTAL, CKMB, CKMBINDEX, TROPONINI in the last 168 hours. BNP (last 3 results) No results for input(s): PROBNP in the last 8760 hours. HbA1C: No results for input(s): HGBA1C in the last 72 hours. CBG: Recent Labs  Lab 04/25/18 1531  GLUCAP 142*   Lipid Profile: No results for input(s): CHOL, HDL, LDLCALC, TRIG, CHOLHDL, LDLDIRECT in the last 72 hours. Thyroid Function Tests: No results for input(s): TSH, T4TOTAL, FREET4, T3FREE, THYROIDAB in the last 72 hours. Anemia Panel: No results for input(s): VITAMINB12, FOLATE, FERRITIN, TIBC, IRON, RETICCTPCT in the last 72 hours. Urine analysis:    Component Value Date/Time   COLORURINE YELLOW 01/03/2018 1843   APPEARANCEUR CLEAR 01/03/2018 1843   LABSPEC 1.017 01/03/2018 1843   PHURINE 7.0 01/03/2018 1843   GLUCOSEU >=500 (A) 01/03/2018 1843   HGBUR NEGATIVE 01/03/2018 1843   BILIRUBINUR NEGATIVE 01/03/2018 1843   KETONESUR NEGATIVE 01/03/2018 1843   PROTEINUR NEGATIVE 01/03/2018 1843   UROBILINOGEN 1.0 07/12/2015 1019   NITRITE NEGATIVE 01/03/2018 1843   LEUKOCYTESUR NEGATIVE 01/03/2018 1843   Sepsis Labs: @LABRCNTIP (procalcitonin:4,lacticidven:4) )No results found for this or any previous visit (from the past 240 hour(s)).   Radiological Exams on Admission: Ct Head Wo Contrast  Result Date: 04/25/2018 CLINICAL DATA:  Dizziness and  confusion for 3 days. History of hypertension. EXAM: CT HEAD WITHOUT CONTRAST TECHNIQUE: Contiguous axial images were obtained from the base of the skull through the vertex without intravenous contrast. COMPARISON:  01/03/2018 FINDINGS: Brain: No evidence of acute infarction, hemorrhage, hydrocephalus, extra-axial collection or mass lesion/mass effect. Patchy areas of white matter hypoattenuation are noted consistent with mild chronic microvascular ischemic change, stable from the prior study. Vascular: No hyperdense vessel or unexpected calcification. Skull: Normal. Negative for fracture or focal lesion. Sinuses/Orbits: Moderate mucosal thickening lines the left maxillary sinus. Mild right ethmoid air cell mucosal thickening. Remaining sinuses and the mastoid air cells are clear. Other: None. IMPRESSION: 1. No acute intracranial abnormalities. 2. Mild chronic microvascular ischemic change. 3. Sinus mucosal thickening as detailed. Electronically Signed   By: Lajean Manes M.D.   On: 04/25/2018 16:27    EKG: Sinus tachy at 106 bpm with right atrial enlargement, PACS and nonspecific ST-T wave changes.   Assessment/Plan  This is a 67 y.o. male with a history of liver cirrhosis now being admitted with:  #. Hepatic encephalopathy  - Admit observation - Lactulose - Start rifaximin (had not been taking 2/2 cost) - Check CXR and UA rule out infection - Neurochecks - Fall precautions - NPO for now - Repeat ammonia in AM  #. History of HLD - Continue Lipitor  #. History of GERD - Continue Zantac, Prilosec  #. History of EtOH - Continue folic acid, thiamine  #. History of CHF - Continue aldactone, lasix/KCl  Social work consult regarding medications  Admission status: Obs IV Fluids: HL Diet/Nutrition: NPO Consults called: None  DVT Px: Lovenox, SCDs and early ambulation. Code Status: Full Code  Disposition Plan: To home in 1-2 days  All the records are reviewed and case discussed with  ED provider. Management plans discussed with the patient and/or family who express understanding and agree with plan of care.  [employer] D.O. on 04/25/2018 at 6:41 PM CC: Primary care physician; Leonard Downing, MD   04/25/2018, 6:41 PM

## 2018-04-25 NOTE — ED Notes (Signed)
Pt's family member went to get a snack and left pt waiting in waiting room. When family member returned pt was missing. Doctor, hospital.

## 2018-04-25 NOTE — ED Triage Notes (Addendum)
Patient states "my girlfriend and I think my ammonia levels are high." Patient A&O x 3, disoriented to day and month. Patient c/o feeling dizzy and confused. Hx cirrhosis.

## 2018-04-25 NOTE — ED Provider Notes (Signed)
[facility] EMERGENCY DEPARTMENT Provider Note   CSN: [id number] Arrival date & time: 04/25/18  1443     History   Chief Complaint Chief Complaint  Patient presents with  . Altered Mental Status    HPI John Warren is a 67 y.o. male.  Pt presents to the ED today with altered mental status.  The pt has a hx of liver cirrhosis with periodic episodes of hepatic encephalopathy.  Pt has been compliant with his lactulose.  Pt's significant other said he started getting confused last night.  Today was worse.  This is c/w previous hepatic encephalopathy episodes.          Past Medical History:  Diagnosis Date  . Alcohol abuse   . Anemia   . Arthritis   . AVM (arteriovenous malformation) of colon    a. By colonoscopy 07/2015.  Marland Kitchen Cirrhosis of liver ([place])   . Colon polyps   . COPD (chronic obstructive pulmonary disease) ([place])   . Diastolic CHF ([place])   . GERD (gastroesophageal reflux disease)   . GI bleed 01/2016  . Headache    cluster headaches  . History of blood transfusion   . History of hiatal hernia   . Hypertension   . Hypokalemia   . Internal hemorrhoids   . Portal hypertensive gastropathy ([place])    a. By EGD 07/2015.  . Sigmoid diverticulosis   . SVT (supraventricular tachycardia) ([place])    a. 07/2015 in setting of severe hypokalemia.  . Symptomatic anemia    a. 07/2015 - felt multifactorial from cirrhosis and AVMs.  . Thrombocytopenia ([place])   . Tobacco use     Patient Active Problem List   Diagnosis Date Noted  . Hepatic encephalopathy ([place]) 01/03/2018  . Hypervolemia   . Coronary artery disease   . NSVT (nonsustained ventricular tachycardia) ([place])   . Acute combined systolic and diastolic heart failure ([place]) 04/07/2017  . Non-ST elevation (NSTEMI) myocardial infarction ([name]) 04/07/2017  . Cirrhosis of liver with ascites ([place])   . Varices, esophageal ([place])   . Acute upper GI bleeding 12/29/2015  . AVM (arteriovenous malformation) of  colon   . Internal hemorrhoids   . Sigmoid diverticulosis   . Alcohol use disorder   . Tobacco use disorder   . Thrombocytopenia ([place])   . COPD (chronic obstructive pulmonary disease) ([place]) 07/13/2015  . Iron deficiency anemia due to chronic blood loss 07/11/2015    Past Surgical History:  Procedure Laterality Date  . CATARACT EXTRACTION    . COLONOSCOPY N/A 07/15/2015   Procedure: COLONOSCOPY;  Surgeon: Wilford Corner, MD;  Location: [facility] ENDOSCOPY;  Service: Endoscopy;  Laterality: N/A;  . COLONOSCOPY WITH PROPOFOL N/A 04/04/2017   Procedure: COLONOSCOPY WITH PROPOFOL;  Surgeon: Ronnette Juniper, MD;  Location: [facility];  Service: Gastroenterology;  Laterality: N/A;  . ESOPHAGOGASTRODUODENOSCOPY N/A 07/15/2015   Procedure: ESOPHAGOGASTRODUODENOSCOPY (EGD);  Surgeon: Wilford Corner, MD;  Location[facility] ENDOSCOPY;  Service: Endoscopy;  Laterality: N/A;  . ESOPHAGOGASTRODUODENOSCOPY N/A 12/30/2015   Procedure: ESOPHAGOGASTRODUODENOSCOPY (EGD);  Surgeon: Ronald Lobo, MD;  Location: [facility] ENDOSCOPY;  Service: Endoscopy;  Laterality: N/A;  . ESOPHAGOGASTRODUODENOSCOPY N/A 03/30/2017   Procedure: ESOPHAGOGASTRODUODENOSCOPY (EGD);  Surgeon: Laurence Spates, MD;  Location: [facility] ENDOSCOPY;  Service: Endoscopy;  Laterality: N/A;  . ESOPHAGOGASTRODUODENOSCOPY N/A 04/04/2017   Procedure: ESOPHAGOGASTRODUODENOSCOPY (EGD);  Surgeon: Arta Silence, MD;  Location: [facility] ENDOSCOPY;  Service: Endoscopy;  Laterality: N/A;  . ESOPHAGOGASTRODUODENOSCOPY (EGD) WITH PROPOFOL N/A 01/24/2016   Procedure: ESOPHAGOGASTRODUODENOSCOPY (EGD) WITH  PROPOFOL;  Surgeon: Wonda Horner, MD;  Location: [facility] ENDOSCOPY;  Service: Endoscopy;  Laterality: N/A;  . ESOPHAGOGASTRODUODENOSCOPY (EGD) WITH PROPOFOL N/A 09/18/2017   Procedure: ESOPHAGOGASTRODUODENOSCOPY (EGD) WITH PROPOFOL;  Surgeon: Wilford Corner, MD;  Location: [facility];  Service: Endoscopy;  Laterality: N/A;  . GIVENS CAPSULE STUDY N/A 04/02/2017   Procedure: GIVENS  CAPSULE STUDY;  Surgeon: Arta Silence, MD;  Location: [facility] ENDOSCOPY;  Service: Endoscopy;  Laterality: N/A;  . HERNIA REPAIR    . HOT HEMOSTASIS N/A 01/24/2016   Procedure: HOT HEMOSTASIS (ARGON PLASMA COAGULATION/BICAP);  Surgeon: Wonda Horner, MD;  Location: [facility] ENDOSCOPY;  Service: Endoscopy;  Laterality: N/A;  . IR ANGIOGRAM SELECTIVE EACH ADDITIONAL VESSEL  04/04/2017  . IR [place] ADDITIONAL VESSEL  04/04/2017  . IR EMBO ART  VEN HEMORR LYMPH EXTRAV  INC GUIDE ROADMAPPING  04/04/2017  . IR EMBO ART  VEN HEMORR LYMPH EXTRAV  INC GUIDE ROADMAPPING  04/04/2017  . IR RADIOLOGIST EVAL & MGMT  05/07/2017  . IR TIPS  04/04/2017  . IR US GUIDE VASC ACCESS RIGHT  04/04/2017  . IR VENOGRAM RENAL UNI RIGHT  04/04/2017  . RADIOLOGY WITH ANESTHESIA N/A 04/04/2017   Procedure: RADIOLOGY WITH ANESTHESIA;  Surgeon: Radiologist, Medication, MD;  Location: [place] [facility];  Service: Radiology;  Laterality: N/A;  . RIGHT/LEFT HEART CATH AND CORONARY ANGIOGRAPHY N/A 04/15/2017   Procedure: Right/Left Heart Cath and Coronary Angiography;  Surgeon: Martinique, Peter M, MD;  Location: [facility] CV LAB;  Service: Cardiovascular;  Laterality: N/A;        Home Medications    Prior to Admission medications   Medication Sig Start Date End Date Taking? Authorizing Provider  acetaminophen (TYLENOL) 500 MG tablet Take 500-1,000 mg by mouth every 8 (eight) hours as needed (for pain).    [provider]  aspirin EC 81 MG tablet Take 81 mg by mouth daily.    [provider]  atorvastatin (LIPITOR) 20 MG tablet Take 1 tablet (20 mg total) by mouth daily. 04/17/17   Minus Liberty, MD  ferrous sulfate 325 (65 FE) MG tablet Take 1 tablet (325 mg total) by mouth 3 (three) times daily with meals. Patient taking differently: Take 325 mg by mouth 2 (two) times daily with a meal.  07/17/15   Dunn, Nedra Hai, PA-C  folic acid (FOLVITE) 1 MG tablet Take 1 tablet (1 mg total) by mouth daily. 01/02/16   Thurnell Lose, MD  furosemide (LASIX) 80 MG tablet Take 80 mg by mouth daily.    [provider]  lactulose (CHRONULAC) 10 GM/15ML solution Take 45 mLs (30 g total) by mouth 3 (three) times daily. 01/04/18   Elwin Mocha, MD  nitroGLYCERIN (NITROSTAT) 0.4 MG SL tablet Place 0.4 mg under the tongue every 5 (five) minutes as needed for chest pain.    [provider]  omeprazole (PRILOSEC) 40 MG capsule Take 1 capsule (40 mg total) by mouth every evening. 01/02/16   Thurnell Lose, MD  potassium chloride SA (K-DUR,KLOR-CON) 20 MEQ tablet Take 20 mEq by mouth every morning.     [provider]  ranitidine (ZANTAC) 150 MG tablet Take 150 mg by mouth 2 (two) times daily.    [provider]  rifaximin (XIFAXAN) 550 MG TABS tablet Take 1 tablet (550 mg total) by mouth 2 (two) times daily. Patient not taking: Reported on 03/29/2017 01/26/16   Florencia Reasons, MD  spironolactone (ALDACTONE) 50 MG tablet Take 1 tablet (  50 mg total) by mouth daily. 04/17/17   Minus Liberty, MD  thiamine 100 MG tablet Take 1 tablet (100 mg total) by mouth daily. Patient taking differently: Take 250 mg by mouth daily.  01/02/16   Thurnell Lose, MD  traMADol (ULTRAM) 50 MG tablet Take 50 mg by mouth every 6 (six) hours as needed (for pain).    [provider]    Family History Family History  Problem Relation Age of Onset  . Liver cancer Mother   . Diabetes Mellitus II Sister     Social History Social History   Tobacco Use  . Smoking status: Former Smoker    Packs/day: 2.00    Years: 40.00    Pack years: 80.00    Types: Cigarettes    Last attempt to quit: 08/14/2008    Years since quitting: 9.7  . Smokeless tobacco: Never Used  Substance Use Topics  . Alcohol use: Yes    Alcohol/week: 0.0 oz    Comment: 1 pint whiskey daily several beer weekly    01/2016  " no alcohol in 3 weeks "  . Drug use: No     Allergies   Penicillins   Review of Systems Review of Systems    Neurological: Positive for dizziness, weakness and light-headedness.  All other systems reviewed and are negative.    Physical Exam Updated Vital Signs BP 135/61   Pulse 99   Temp 97.9 F (36.6 C) (Oral)   Resp (!) 23   SpO2 100%   Physical Exam  Constitutional: He appears well-developed and well-nourished.  HENT:  Head: Normocephalic and atraumatic.  Right Ear: External ear normal.  Left Ear: External ear normal.  Nose: Nose normal.  Mouth/Throat: Oropharynx is clear and moist.  Eyes: Pupils are equal, round, and reactive to light. Conjunctivae and EOM are normal.  Neck: Normal range of motion. Neck supple.  Cardiovascular: Regular rhythm, normal heart sounds and intact distal pulses. Tachycardia present.  Pulmonary/Chest: Effort normal and breath sounds normal.  Abdominal: Soft. Bowel sounds are normal.  Musculoskeletal: Normal range of motion.  Neurological: He is alert.  Pt is moving all 4 extremities, but is slow to respond.  Skin: Skin is warm. Capillary refill takes less than 2 seconds.  Psychiatric: He has a normal mood and affect.  Nursing note and vitals reviewed.    ED Treatments / Results  Labs (all labs ordered are listed, but only abnormal results are displayed) Labs Reviewed  COMPREHENSIVE METABOLIC PANEL - Abnormal; Notable for the following components:      Result Value   Glucose, Bld 160 (*)    BUN 27 (*)    Total Protein 6.1 (*)    Albumin 3.2 (*)    Total Bilirubin 1.4 (*)    All other components within normal limits  CBC - Abnormal; Notable for the following components:   WBC 14.2 (*)    RBC 4.05 (*)    Hemoglobin 12.7 (*)    HCT 37.8 (*)    All other components within normal limits  AMMONIA - Abnormal; Notable for the following components:   Ammonia 52 (*)    All other components within normal limits  CBG MONITORING, ED - Abnormal; Notable for the following components:   Glucose-Capillary 142 (*)    All other components within normal  limits  ETHANOL  RAPID URINE DRUG SCREEN, HOSP PERFORMED    EKG EKG Interpretation  Date/Time:  Friday Apr 25 2018 15:28:49 EDT Ventricular Rate:  106 PR Interval:  164 QRS Duration: 80 QT Interval:  332 QTC Calculation: 441 R Axis:   6 Text Interpretation:  Sinus tachycardia with Premature atrial complexes with Abberant conduction Right atrial enlargement Borderline ECG Since last tracing rate faster Confirmed by Isla Pence [phone]) on 04/25/2018 5:01:32 PM   Radiology Ct Head Wo Contrast  Result Date: 04/25/2018 CLINICAL DATA:  Dizziness and confusion for 3 days. History of hypertension. EXAM: CT HEAD WITHOUT CONTRAST TECHNIQUE: Contiguous axial images were obtained from the base of the skull through the vertex without intravenous contrast. COMPARISON:  01/03/2018 FINDINGS: Brain: No evidence of acute infarction, hemorrhage, hydrocephalus, extra-axial collection or mass lesion/mass effect. Patchy areas of white matter hypoattenuation are noted consistent with mild chronic microvascular ischemic change, stable from the prior study. Vascular: No hyperdense vessel or unexpected calcification. Skull: Normal. Negative for fracture or focal lesion. Sinuses/Orbits: Moderate mucosal thickening lines the left maxillary sinus. Mild right ethmoid air cell mucosal thickening. Remaining sinuses and the mastoid air cells are clear. Other: None. IMPRESSION: 1. No acute intracranial abnormalities. 2. Mild chronic microvascular ischemic change. 3. Sinus mucosal thickening as detailed. Electronically Signed   By: Lajean Manes M.D.   On: 04/25/2018 16:27    Procedures Procedures (including critical care time)  Medications Ordered in ED Medications  sodium chloride 0.9 % bolus 1,000 mL (has no administration in time range)  lactulose (CHRONULAC) 10 GM/15ML solution 20 g (has no administration in time range)     Initial Impression / Assessment and Plan / ED Course  I have reviewed the triage  vital signs and the nursing notes.  Pertinent labs & imaging results that were available during my care of the patient were reviewed by me and considered in my medical decision making (see chart for details).    Pt symptomatic from ammonia of 52.  He will be given lactulose here.  He was d/w Dr. Ara Kussmaul (triad) for admission.  Final Clinical Impressions(s) / ED Diagnoses   Final diagnoses:  Hepatic encephalopathy [facility])    ED Discharge Orders    None       Isla Pence, MD 04/25/18 [phone]

## 2018-04-25 NOTE — ED Provider Notes (Signed)
Patient placed in Quick Look pathway, seen and evaluated   Chief Complaint: confusion  HPI: Patient is a 67 year old male presents with confusion for the past few days.  Patient states he felt confused, occasionally will feel dizzy/lightheaded, has not had any syncopal episodes.  He states "my girlfriend and I think it is my ammonia level."   ROS: Positive for confusion and dizziness Negative for chest pain, dyspnea, vomiting, numbness, weakness or change in vision.   Physical Exam:   Gen: No distress  Neuro: Awake and Alert  Skin: Warm    Focused Exam: Alert. Clear speech. Slow to respond. Oriented to person and to city, oriented to year not to month or day of the week. CN III-XII grossly intact. 5/5 symmetric grip strength. Negative pronator drift.    Initiation of care has begun. The patient has been counseled on the process, plan, and necessity for staying for the completion/evaluation, and the remainder of the medical screening examination. Patient advised to inform staff should symptoms change/worsen or if he has any other concerns.   Discussed with RN- instructed patient not to go back to waiting room, pending bed assignment in acute care at this time.    Amaryllis Dyke, PA-C 04/25/18 1602    Sherwood Gambler, MD 04/28/18 1312

## 2018-04-26 DIAGNOSIS — J019 Acute sinusitis, unspecified: Secondary | ICD-10-CM | POA: Diagnosis not present

## 2018-04-26 DIAGNOSIS — I5022 Chronic systolic (congestive) heart failure: Secondary | ICD-10-CM

## 2018-04-26 DIAGNOSIS — B9689 Other specified bacterial agents as the cause of diseases classified elsewhere: Secondary | ICD-10-CM | POA: Diagnosis not present

## 2018-04-26 DIAGNOSIS — K7290 Hepatic failure, unspecified without coma: Secondary | ICD-10-CM | POA: Diagnosis not present

## 2018-04-26 LAB — CBC
HEMATOCRIT: 32.2 % — AB (ref 39.0–52.0)
Hemoglobin: 10.9 g/dL — ABNORMAL LOW (ref 13.0–17.0)
MCH: 31.5 pg (ref 26.0–34.0)
MCHC: 33.9 g/dL (ref 30.0–36.0)
MCV: 93.1 fL (ref 78.0–100.0)
PLATELETS: 140 10*3/uL — AB (ref 150–400)
RBC: 3.46 MIL/uL — ABNORMAL LOW (ref 4.22–5.81)
RDW: 14.2 % (ref 11.5–15.5)
WBC: 9.4 10*3/uL (ref 4.0–10.5)

## 2018-04-26 LAB — BASIC METABOLIC PANEL
Anion gap: 9 (ref 5–15)
BUN: 21 mg/dL — AB (ref 6–20)
CALCIUM: 8.5 mg/dL — AB (ref 8.9–10.3)
CO2: 21 mmol/L — ABNORMAL LOW (ref 22–32)
CREATININE: 1.08 mg/dL (ref 0.61–1.24)
Chloride: 112 mmol/L — ABNORMAL HIGH (ref 101–111)
GFR calc Af Amer: >60 mL/min (ref >60)
GLUCOSE: 126 mg/dL — AB (ref 65–99)
POTASSIUM: 3.6 mmol/L (ref 3.5–5.1)
SODIUM: 142 mmol/L (ref 135–145)

## 2018-04-26 LAB — HIV ANTIBODY (ROUTINE TESTING W REFLEX): HIV SCREEN 4TH GENERATION: NONREACTIVE

## 2018-04-26 MED ORDER — LACTULOSE 10 GM/15ML PO SOLN
30.0000 g | Freq: Once | ORAL | Status: AC
  Administered 2018-04-26: 30 g via ORAL
  Filled 2018-04-26: qty 45

## 2018-04-26 MED ORDER — CEFPODOXIME PROXETIL 200 MG PO TABS: 200.0000 mg | ORAL_TABLET | Freq: Two times a day (BID) | ORAL | 0 refills | Status: DC

## 2018-04-26 NOTE — Discharge Summary (Signed)
Physician Discharge Summary  John Warren GQQ:[id number] DOB: 1951-03-28 DOA: 04/25/2018  PCP: Leonard Downing, MD  Admit date: 04/25/2018 Discharge date: 04/26/2018  Admitted From: Home  Disposition:  Home   Recommendations for Outpatient Follow-up:  1. Follow up with PCP in 1-2 weeks 2. Please obtain BMP/CBC in one week  Home Health: None  Equipment/Devices: None  Discharge Condition: Fair  CODE STATUS: FULL Diet recommendation: Low sodium  Brief/Interim Summary: John Warren is a 67 y.o. male with a known history of liver cirrhosis presents to the emergency department for evaluation of AMS.  Patient was in a usual state of health until last night when wife noticed that the patient was increasingly confused similar to previous episodes of hyperammonemia.  Has been compliant with lactulose.  He has been prescribed rifaximin but has not started secondary to high copay.   Also complains of one week of nasal congestion and sinus pain associated with yellowish nasal discharged.        Hepatic encephalopathy Treated with IV fluids and lactulose.  His mentation returned to normal by morning, he felt back to his baseline, had a large bowel movement, and was discharged with instructions on how to take lactulose.   Acute bacterial rhinosinusitis The patient described onset of sinus congestion and discharge over a week ago, with some initial improvement followed by worsening again now with thick, yellowish, foul tasting consistency.  Started on cefpodoxime at discharge.  Chronic systolic CHF Appeared euvolemic.  Lasix and spironolactone were held overnight, resumed on discharge.   Discharge Diagnoses:  Active Problems:   Hepatic encephalopathy [facility])    Discharge Instructions  Discharge Instructions    Diet - low sodium heart healthy   Complete by:  As directed    Discharge instructions   Complete by:  As directed    From Dr. Loleta Books: You were admitted for sleepiness  and confusion from "hepatic encephalopathy".  (That's just the formal name for ammonia buildup leading to confusion). You were treated with IV fluids (because dehydration often is what *caused* the ammonia buildup) and more importantly, lactulose.  Continue your home lactulose If you get constipated, if you don't have more than 2 soft BMs per day, take extra lactulose.  If you are to the point that you are sleepy or confused or weak, take lactulose 30 g every hour until you have a large BM (up to 4 doses).  If you don't have a large BM after 4 doses, go to the ER.  For your fluid intake: Try to aim for between 1 and a half and 2 liters of fluid intake per day.  (Try taking a 2 liter bottle and make a mark just a little ways below the top, fill that with water to the line each day, and make sure you drink most of it each day).   For your sinus infection: Take the antibiotic: Cefpodoxime/Vantin 200 mg twice daily starting tonight, for 7 days  Follow up with your primary care in 1 week   Increase activity slowly   Complete by:  As directed      Allergies as of 04/26/2018      Reactions   Penicillins Rash   Has patient had a PCN reaction causing immediate rash, facial/tongue/throat swelling, SOB or lightheadedness with hypotension: Yes Has patient had a PCN reaction causing severe rash involving mucus membranes or skin necrosis: No Has patient had a PCN reaction that required hospitalization: No Has patient had a PCN reaction occurring  within the last 10 years: No If all of the above answers are "NO", then may proceed with Cephalosporin use. Had CTX before      Medication List    TAKE these medications   acetaminophen 500 MG tablet Commonly known as:  TYLENOL Take 500-1,000 mg by mouth every 8 (eight) hours as needed (for pain).   aspirin EC 81 MG tablet Take 81 mg by mouth daily.   atorvastatin 20 MG tablet Commonly known as:  LIPITOR Take 1 tablet (20 mg total) by mouth  daily.   cefpodoxime 200 MG tablet Commonly known as:  VANTIN Take 1 tablet (200 mg total) by mouth 2 (two) times daily.   ferrous sulfate 325 (65 FE) MG tablet Take 1 tablet (325 mg total) by mouth 3 (three) times daily with meals. What changed:  when to take this   folic acid 1 MG tablet Commonly known as:  FOLVITE Take 1 tablet (1 mg total) by mouth daily.   furosemide 80 MG tablet Commonly known as:  LASIX Take 80 mg by mouth daily.   lactulose 10 GM/15ML solution Commonly known as:  CHRONULAC Take 45 mLs (30 g total) by mouth 3 (three) times daily.   nitroGLYCERIN 0.4 MG SL tablet Commonly known as:  NITROSTAT Place 0.4 mg under the tongue every 5 (five) minutes as needed for chest pain.   omeprazole 40 MG capsule Commonly known as:  PRILOSEC Take 1 capsule (40 mg total) by mouth every evening.   potassium chloride SA 20 MEQ tablet Commonly known as:  K-DUR,KLOR-CON Take 20 mEq by mouth every morning.   ranitidine 150 MG tablet Commonly known as:  ZANTAC Take 150 mg by mouth 2 (two) times daily.   rifaximin 550 MG Tabs tablet Commonly known as:  XIFAXAN Take 1 tablet (550 mg total) by mouth 2 (two) times daily.   spironolactone 50 MG tablet Commonly known as:  ALDACTONE Take 1 tablet (50 mg total) by mouth daily.   thiamine 100 MG tablet Take 1 tablet (100 mg total) by mouth daily. What changed:  how much to take   traMADol 50 MG tablet Commonly known as:  ULTRAM Take 50 mg by mouth every 6 (six) hours as needed (for pain).       Allergies  Allergen Reactions  . Penicillins Rash    Has patient had a PCN reaction causing immediate rash, facial/tongue/throat swelling, SOB or lightheadedness with hypotension: Yes Has patient had a PCN reaction causing severe rash involving mucus membranes or skin necrosis: No Has patient had a PCN reaction that required hospitalization: No Has patient had a PCN reaction occurring within the last 10 years: No If all of  the above answers are "NO", then may proceed with Cephalosporin use. Had CTX before    Consultations:  None   Procedures/Studies: Dg Chest 1 View  Result Date: 04/25/2018 CLINICAL DATA:  Altered mental status EXAM: CHEST  1 VIEW COMPARISON:  01/03/2018 FINDINGS: Heart size and pulmonary vascularity are normal. Probable emphysematous changes in the lungs with scattered fibrosis. Fibrosis and pleural thickening in the apices, likely postinflammatory. No airspace disease or consolidation. No blunting of costophrenic angles. No pneumothorax. Mediastinal contours appear intact. Calcification of the aorta. Degenerative changes in the spine. IMPRESSION: Emphysematous changes and fibrosis in the lungs. No evidence of active pulmonary disease. Aortic atherosclerosis. Electronically Signed   By: Lucienne Capers M.D.   On: 04/25/2018 23:49   Ct Head Wo Contrast  Result Date: 04/25/2018 CLINICAL  DATA:  Dizziness and confusion for 3 days. History of hypertension. EXAM: CT HEAD WITHOUT CONTRAST TECHNIQUE: Contiguous axial images were obtained from the base of the skull through the vertex without intravenous contrast. COMPARISON:  01/03/2018 FINDINGS: Brain: No evidence of acute infarction, hemorrhage, hydrocephalus, extra-axial collection or mass lesion/mass effect. Patchy areas of white matter hypoattenuation are noted consistent with mild chronic microvascular ischemic change, stable from the prior study. Vascular: No hyperdense vessel or unexpected calcification. Skull: Normal. Negative for fracture or focal lesion. Sinuses/Orbits: Moderate mucosal thickening lines the left maxillary sinus. Mild right ethmoid air cell mucosal thickening. Remaining sinuses and the mastoid air cells are clear. Other: None. IMPRESSION: 1. No acute intracranial abnormalities. 2. Mild chronic microvascular ischemic change. 3. Sinus mucosal thickening as detailed. Electronically Signed   By: Lajean Manes M.D.   On: 04/25/2018 16:27        Subjective: Feels back to normal.  No confusion, sleepiness.  Has had a bowel movement, unremarkable.  No fever, vomiting.  Has persistent sinus congestion, sinus pain, and headache.  Discharge Exam: Vitals:   04/26/18 0536 04/26/18 1450  BP: 121/63 (!) 119/51  Pulse: 92 (!) 103  Resp: 18 18  Temp: 98.3 F (36.8 C)   SpO2: 100% 100%   Vitals:   04/25/18 2008 04/25/18 2039 04/26/18 0536 04/26/18 1450  BP:  131/62 121/63 (!) 119/51  Pulse: 100 (!) 101 92 (!) 103  Resp: 18 18 18 18   Temp:  98.5 F (36.9 C) 98.3 F (36.8 C)   TempSrc:  Oral Oral   SpO2: 100% 100% 100% 100%    General: Pt is alert, awake, not in acute distress HEENT: Thick yellowish purulent postnasal discharge.   Cardiovascular: RRR, S1/S2 +, no rubs, no gallops Respiratory: CTA bilaterally, no wheezing, no rhonchi Abdominal: Soft, NT, ND, bowel sounds + Extremities: minimal edema, no cyanosis    The results of significant diagnostics from this hospitalization (including imaging, microbiology, ancillary and laboratory) are listed below for reference.     Microbiology: No results found for this or any previous visit (from the past 240 hour(s)).   Labs: BNP (last 3 results) Recent Labs    01/03/18 1747  BNP 063.0*   Basic Metabolic Panel: Recent Labs  Lab 04/25/18 1533 04/25/18 2110 04/26/18 0315  NA 140  --  142  K 4.3  --  3.6  CL 110  --  112*  CO2 22  --  21*  GLUCOSE 160*  --  126*  BUN 27*  --  21*  CREATININE 1.00 0.98 1.08  CALCIUM 9.0  --  8.5*  MG 2.0  --   --   PHOS 3.3  --   --    Liver Function Tests: Recent Labs  Lab 04/25/18 1533  AST 30  ALT 24  ALKPHOS 97  BILITOT 1.4*  PROT 6.1*  ALBUMIN 3.2*   No results for input(s): LIPASE, AMYLASE in the last 168 hours. Recent Labs  Lab 04/25/18 1533  AMMONIA 52*   CBC: Recent Labs  Lab 04/25/18 1533 04/25/18 2110 04/26/18 0315  WBC 14.2* 9.2 9.4  HGB 12.7* 10.5* 10.9*  HCT 37.8* 31.5* 32.2*  MCV  93.3 93.8 93.1  PLT 160 130* 140*   Cardiac Enzymes: No results for input(s): CKTOTAL, CKMB, CKMBINDEX, TROPONINI in the last 168 hours. BNP: Invalid input(s): POCBNP CBG: Recent Labs  Lab 04/25/18 1531  GLUCAP 142*   D-Dimer No results for input(s): DDIMER in the last 72  hours. Hgb A1c No results for input(s): HGBA1C in the last 72 hours. Lipid Profile No results for input(s): CHOL, HDL, LDLCALC, TRIG, CHOLHDL, LDLDIRECT in the last 72 hours. Thyroid function studies No results for input(s): TSH, T4TOTAL, T3FREE, THYROIDAB in the last 72 hours.  Invalid input(s): FREET3 Anemia work up No results for input(s): VITAMINB12, FOLATE, FERRITIN, TIBC, IRON, RETICCTPCT in the last 72 hours. Urinalysis    Component Value Date/Time   COLORURINE YELLOW 04/25/2018 2215   [place] 04/25/2018 2215   LABSPEC 1.026 04/25/2018 2215   PHURINE 7.0 04/25/2018 2215   GLUCOSEU NEGATIVE 04/25/2018 2215   HGBUR NEGATIVE 04/25/2018 2215   BILIRUBINUR NEGATIVE 04/25/2018 2215   KETONESUR NEGATIVE 04/25/2018 2215   PROTEINUR NEGATIVE 04/25/2018 2215   UROBILINOGEN 1.0 07/12/2015 1019   NITRITE NEGATIVE 04/25/2018 2215   LEUKOCYTESUR NEGATIVE 04/25/2018 2215   Sepsis Labs Invalid input(s): PROCALCITONIN,  WBC,  LACTICIDVEN Microbiology No results found for this or any previous visit (from the past 240 hour(s)).   Time coordinating discharge: 76minutes       SIGNED:   Edwin Dada, MD  [facility] 04/26/2018, 2:26 PM

## 2018-04-26 NOTE — Progress Notes (Signed)
Patient arrived to the unit via bed from the emergency department.  Skin assessment complete.  Bruises to all four extremities.  IV intact to the left antecubital. Educated the patient on how to reach the staff on unit.  Lowered the bed, activated the bed alarm and placed the call light within reach. Will continue to monitor the patient

## 2018-04-26 NOTE — Progress Notes (Signed)
Pt given discharge instructions, prescriptions, and care notes. Pt verbalized understanding AEB no further questions or concerns at this time. IV was discontinued, no redness, pain, or swelling noted at this time. Telemetry discontinued and Centralized Telemetry was notified. Pt left the floor via wheelchair with staff in stable condition. 

## 2018-04-26 NOTE — Evaluation (Addendum)
Physical Therapy Evaluation & Discharge Patient Details Name: John Warren MRN: [id number] DOB: March 10, 1951 Today's Date: 04/26/2018   History of Present Illness  Pt is a 67 y.o. male admitted 04/25/18 for evaluation of AMS; worked up for hepatic encephalopathy. PMH includes HTN, COPD, CHF, arthritis, liver cirrhosis, previous episodes of hyperammonemia.    Clinical Impression  Patient evaluated by Physical Therapy with no further acute PT needs identified. PTA, pt indep and lives with wife. Today, pt indep with hallway ambulation; DGI of 20/24 indicates decreased fall risk with higher level balance activities. Pt encouraged to continue ambulating during hospital admission (RN aware); pt reports he will plan to use RW for this. Cognition appears [facility]; pt reports feeling back to baseline. All education has been completed and the patient has no further questions. PT is signing off. Thank you for this referral.    Follow Up Recommendations No PT follow up    Equipment Recommendations  None recommended by PT    Recommendations for Other Services       Precautions / Restrictions Precautions Precautions: Fall Restrictions Weight Bearing Restrictions: No      Mobility  Bed Mobility Overal bed mobility: Independent                Transfers Overall transfer level: Independent                  Ambulation/Gait Ambulation/Gait assistance: Independent Ambulation Distance (Feet): 600 Feet Assistive device: None Gait Pattern/deviations: Step-through pattern;Decreased stride length Gait velocity: Decreased Gait velocity interpretation: 1.31 - 2.62 ft/sec, indicative of limited community ambulator General Gait Details: Slow, controlled amb indep with no DME. Pt prefers he will plan to amb with RW if walking alone during hospital stay  Stairs Stairs: Yes Stairs assistance: Supervision Stair Management: One rail Right;Alternating pattern;Forwards Number of Stairs: 3     Wheelchair Mobility    Modified Rankin (Stroke Patients Only)       Balance Overall balance assessment: Needs assistance   Sitting balance-Leahy Scale: Good Sitting balance - Comments: Indep to remove SCDs sitting EOB     Standing balance-Leahy Scale: Good                   Standardized Balance Assessment Standardized Balance Assessment : Dynamic Gait Index   Dynamic Gait Index Level Surface: Normal Change in Gait Speed: Mild Impairment Gait with Horizontal Head Turns: Normal Gait with Vertical Head Turns: Mild Impairment Gait and Pivot Turn: Mild Impairment Step Over Obstacle: Normal Step Around Obstacles: Normal Steps: Mild Impairment Total Score: 20       Pertinent Vitals/Pain Pain Assessment: No/denies pain    Home Living Family/patient expects to be discharged to:: Private residence Living Arrangements: Spouse/significant other Available Help at Discharge: Family;Available 24 hours/day Type of Home: House Home Access: Stairs to enter Entrance Stairs-Rails: None("something I can hold onto") Entrance Stairs-Number of Steps: 3 Home Layout: One level Home Equipment: Walker - 2 wheels      Prior Function Level of Independence: Independent         Comments: Active outside, plays with grandson     Hand Dominance        Extremity/Trunk Assessment   Upper Extremity Assessment Upper Extremity Assessment: Overall WFL for tasks assessed    Lower Extremity Assessment Lower Extremity Assessment: Overall WFL for tasks assessed       Communication   Communication: No difficulties  Cognition Arousal/Alertness: Awake/alert Behavior During Therapy: WFL for tasks assessed/performed Overall Cognitive  Status: Within Functional Limits for tasks assessed                                 General Comments: Slower speech in general, but cognition [facility]      General Comments      Exercises     Assessment/Plan    PT Assessment  Patent does not need any further PT services  PT Problem List         PT Treatment Interventions      PT Goals (Current goals can be found in the Care Plan section)  Acute Rehab PT Goals PT Goal Formulation: All assessment and education complete, DC therapy    Frequency     Barriers to discharge        Co-evaluation               AM-PAC PT "6 Clicks" Daily Activity  Outcome Measure Difficulty turning over in bed (including adjusting bedclothes, sheets and blankets)?: None Difficulty moving from lying on back to sitting on the side of the bed? : None Difficulty sitting down on and standing up from a chair with arms (e.g., wheelchair, bedside commode, etc,.)?: None Help needed moving to and from a bed to chair (including a wheelchair)?: None Help needed walking in hospital room?: None Help needed climbing 3-5 steps with a railing? : A Little 6 Click Score: 23    End of Session Equipment Utilized During Treatment: Gait belt Activity Tolerance: Patient tolerated treatment well Patient left: in bed;with call bell/phone within reach Nurse Communication: Mobility status PT Visit Diagnosis: Other abnormalities of gait and mobility (R26.89)    Time: 5427-0623 PT Time Calculation (min) (ACUTE ONLY): 28 min   Charges:   PT Evaluation $PT Eval Moderate Complexity: 1 Mod PT Treatments $Gait Training: 8-22 mins   PT G Codes:       Mabeline Caras, PT, DPT Acute Rehab Services  Pager: [place] 04/26/2018, 10:06 AM

## 2018-04-26 NOTE — Progress Notes (Signed)
Patient refuses to take the full dose of lactulose ordered for tonight. Patient wanted to space the dose out at different times. Notified NP X. Blount of patient refusal and request for the dose to be administered at separate times.  No response from NP.  Patient refused the lactulose.  Will continue to monitor the patient and notify as needed.

## 2018-05-23 ENCOUNTER — Encounter: Payer: Self-pay | Admitting: Cardiology

## 2018-08-08 ENCOUNTER — Other Ambulatory Visit (HOSPITAL_COMMUNITY): Payer: Self-pay | Admitting: Gastroenterology

## 2018-08-08 DIAGNOSIS — K7460 Unspecified cirrhosis of liver: Secondary | ICD-10-CM

## 2018-08-13 ENCOUNTER — Ambulatory Visit (HOSPITAL_COMMUNITY): Payer: Medicare Other

## 2018-08-14 ENCOUNTER — Ambulatory Visit (HOSPITAL_COMMUNITY)
Admission: RE | Admit: 2018-08-14 | Discharge: 2018-08-14 | Disposition: A | Discharge status: Home / Self Care | Payer: Medicare Other | Source: Ambulatory Visit | Attending: Gastroenterology | Admitting: Gastroenterology

## 2018-08-14 ENCOUNTER — Other Ambulatory Visit (HOSPITAL_COMMUNITY): Payer: Self-pay | Admitting: Gastroenterology

## 2018-08-14 DIAGNOSIS — I10 Essential (primary) hypertension: Secondary | ICD-10-CM | POA: Diagnosis not present

## 2018-08-14 DIAGNOSIS — K7460 Unspecified cirrhosis of liver: Secondary | ICD-10-CM

## 2018-08-14 DIAGNOSIS — R188 Other ascites: Secondary | ICD-10-CM | POA: Insufficient documentation

## 2018-08-14 MED ORDER — LIDOCAINE HCL (PF) 2 % IJ SOLN
INTRAMUSCULAR | Status: AC
  Filled 2018-08-14: qty 20

## 2018-08-14 NOTE — Progress Notes (Signed)
Patient presented to [facility] IR for possible paracentesis.  Limited US Abdomen shows no ascites.  Images saved and will be dictated separately.  No procedure performed.   Brynda Greathouse, MS RD PA-C 9:08 AM

## 2018-09-04 ENCOUNTER — Other Ambulatory Visit: Payer: Self-pay | Admitting: Family Medicine

## 2018-09-04 DIAGNOSIS — R52 Pain, unspecified: Secondary | ICD-10-CM

## 2018-09-08 ENCOUNTER — Ambulatory Visit
Admission: RE | Admit: 2018-09-08 | Discharge: 2018-09-08 | Disposition: A | Discharge status: Home / Self Care | Payer: Medicare Other | Source: Ambulatory Visit | Attending: Family Medicine | Admitting: Family Medicine

## 2018-09-08 DIAGNOSIS — R52 Pain, unspecified: Secondary | ICD-10-CM

## 2018-09-11 ENCOUNTER — Ambulatory Visit
Admission: RE | Admit: 2018-09-11 | Discharge: 2018-09-11 | Disposition: A | Discharge status: Home / Self Care | Payer: Medicare Other | Source: Ambulatory Visit | Attending: Family Medicine | Admitting: Family Medicine

## 2018-09-11 DIAGNOSIS — R52 Pain, unspecified: Secondary | ICD-10-CM

## 2018-10-08 ENCOUNTER — Telehealth: Payer: Self-pay | Admitting: Cardiology

## 2018-10-15 NOTE — Telephone Encounter (Signed)
done

## 2018-11-12 ENCOUNTER — Ambulatory Visit: Payer: Medicare Other | Admitting: Diagnostic Neuroimaging

## 2018-11-12 ENCOUNTER — Encounter: Payer: Self-pay | Admitting: Diagnostic Neuroimaging

## 2018-11-12 VITALS — BP 141/76 | HR 103 | Ht 72.0 in | Wt 211.6 lb

## 2018-11-12 DIAGNOSIS — G629 Polyneuropathy, unspecified: Secondary | ICD-10-CM | POA: Diagnosis not present

## 2018-11-12 DIAGNOSIS — G621 Alcoholic polyneuropathy: Secondary | ICD-10-CM | POA: Diagnosis not present

## 2018-11-12 MED ORDER — GABAPENTIN 100 MG PO CAPS: 100.0000 mg | ORAL_CAPSULE | Freq: Three times a day (TID) | ORAL | 6 refills | Status: DC

## 2018-11-12 NOTE — Progress Notes (Signed)
[facility]  PATIENT: John Warren DOB: Oct 24, 1951  REFERRING CLINICIAN: Gwyneth Revels HISTORY FROM: patient  REASON FOR VISIT: new consult   HISTORICAL  CHIEF COMPLAINT:  Chief Complaint  Patient presents with  . Neuropathic pain    rm 6, New pt,  sig other- Debbie, "legs and feet extremely painful at all times"    HISTORY OF PRESENT ILLNESS:   67 year old male here for evaluation of lower extremity pain.  Patient has long history of alcohol abuse for the past 40 years.  He developed liver cirrhosis, hyperammonemia, hepatic encephalopathy.  He has had lower extremity swelling and edema.  These were diagnosed around 2016.  Around that time he stopped drinking alcohol.  However he started to develop increasing pain, burning, tingling in his toes, feet, legs up to his thighs.  He is having some numbness and tingling in his fingers as well.    REVIEW OF SYSTEMS: Full 14 system review of systems performed and negative with exception of: Pain cramps aching muscles easy bruising shortness of breath swelling in legs.  ALLERGIES: Allergies  Allergen Reactions  . Penicillins Rash    Has patient had a PCN reaction causing immediate rash, facial/tongue/throat swelling, SOB or lightheadedness with hypotension: Yes Has patient had a PCN reaction causing severe rash involving mucus membranes or skin necrosis: No Has patient had a PCN reaction that required hospitalization: No Has patient had a PCN reaction occurring within the last 10 years: No If all of the above answers are "NO", then may proceed with Cephalosporin use. Had CTX before    HOME MEDICATIONS: Outpatient Medications Prior to Visit  Medication Sig Dispense Refill  . acetaminophen (TYLENOL) 500 MG tablet Take 500-1,000 mg by mouth every 8 (eight) hours as needed (for pain).    Marland Kitchen aspirin EC 81 MG tablet Take 81 mg by mouth daily.    Marland Kitchen atorvastatin (LIPITOR) 20 MG tablet Take 1 tablet (20 mg total) by mouth  daily. 30 tablet 0  . famotidine (PEPCID) 20 MG tablet 20 mg daily.  1  . ferrous sulfate 325 (65 FE) MG tablet Take 1 tablet (325 mg total) by mouth 3 (three) times daily with meals. (Patient taking differently: Take 325 mg by mouth 2 (two) times daily with a meal. ) 90 tablet 1  . folic acid (FOLVITE) 1 MG tablet Take 1 tablet (1 mg total) by mouth daily. 30 tablet 0  . furosemide (LASIX) 80 MG tablet Take 80 mg by mouth daily.    Marland Kitchen lactulose (CHRONULAC) 10 GM/15ML solution Take 45 mLs (30 g total) by mouth 3 (three) times daily. 240 mL 0  . nortriptyline (PAMELOR) 10 MG capsule 10 mg at bedtime.  0  . potassium chloride SA (K-DUR,KLOR-CON) 20 MEQ tablet Take 20 mEq by mouth every morning.     . rifaximin (XIFAXAN) 550 MG TABS tablet Take 1 tablet (550 mg total) by mouth 2 (two) times daily. 42 tablet 0  . spironolactone (ALDACTONE) 50 MG tablet Take 1 tablet (50 mg total) by mouth daily. 30 tablet 0  . thiamine 100 MG tablet Take 1 tablet (100 mg total) by mouth daily. (Patient taking differently: Take 125 mg by mouth daily. ) 30 tablet 0  . traMADol (ULTRAM) 50 MG tablet Take 50 mg by mouth every 6 (six) hours as needed (for pain).    Marland Kitchen omeprazole (PRILOSEC) 40 MG capsule Take 1 capsule (40 mg total) by mouth every evening. 30 capsule 0  .  nitroGLYCERIN (NITROSTAT) 0.4 MG SL tablet Place 0.4 mg under the tongue every 5 (five) minutes as needed for chest pain.    . cefpodoxime (VANTIN) 200 MG tablet Take 1 tablet (200 mg total) by mouth 2 (two) times daily. 14 tablet 0  . ranitidine (ZANTAC) 150 MG tablet Take 150 mg by mouth 2 (two) times daily.     No facility-administered medications prior to visit.     PAST MEDICAL HISTORY: Past Medical History:  Diagnosis Date  . Alcohol abuse   . Anemia   . Arthritis   . AVM (arteriovenous malformation) of colon    a. By colonoscopy 07/2015.  Marland Kitchen Cirrhosis of liver ([place])   . Colon polyps   . COPD (chronic obstructive pulmonary disease) ([place])   .  Diastolic CHF ([place])   . GERD (gastroesophageal reflux disease)   . GI bleed 01/2016  . Headache    cluster headaches  . History of blood transfusion   . History of hiatal hernia   . Hypertension   . Hypokalemia   . Internal hemorrhoids   . Portal hypertensive gastropathy ([place])    a. By EGD 07/2015.  . Sigmoid diverticulosis   . SVT (supraventricular tachycardia) ([place])    a. 07/2015 in setting of severe hypokalemia.  . Symptomatic anemia    a. 07/2015 - felt multifactorial from cirrhosis and AVMs.  . Thrombocytopenia ([place])   . Tobacco use     PAST SURGICAL HISTORY: Past Surgical History:  Procedure Laterality Date  . CATARACT EXTRACTION    . COLONOSCOPY N/A 07/15/2015   Procedure: COLONOSCOPY;  Surgeon: Wilford Corner, MD;  Location[facility] ENDOSCOPY;  Service: Endoscopy;  Laterality: N/A;  . COLONOSCOPY WITH PROPOFOL N/A 04/04/2017   Procedure: COLONOSCOPY WITH PROPOFOL;  Surgeon: Ronnette Juniper, MD;  Location: [place];  Service: Gastroenterology;  Laterality: N/A;  . ESOPHAGOGASTRODUODENOSCOPY N/A 07/15/2015   Procedure: ESOPHAGOGASTRODUODENOSCOPY (EGD);  Surgeon: Wilford Corner, MD;  Location: [facility] ENDOSCOPY;  Service: Endoscopy;  Laterality: N/A;  . ESOPHAGOGASTRODUODENOSCOPY N/A 12/30/2015   Procedure: ESOPHAGOGASTRODUODENOSCOPY (EGD);  Surgeon: Ronald Lobo, MD;  Location: [facility] ENDOSCOPY;  Service: Endoscopy;  Laterality: N/A;  . ESOPHAGOGASTRODUODENOSCOPY N/A 03/30/2017   Procedure: ESOPHAGOGASTRODUODENOSCOPY (EGD);  Surgeon: Laurence Spates, MD;  Location: [facility] ENDOSCOPY;  Service: Endoscopy;  Laterality: N/A;  . ESOPHAGOGASTRODUODENOSCOPY N/A 04/04/2017   Procedure: ESOPHAGOGASTRODUODENOSCOPY (EGD);  Surgeon: Arta Silence, MD;  Location: [facility] ENDOSCOPY;  Service: Endoscopy;  Laterality: N/A;  . ESOPHAGOGASTRODUODENOSCOPY (EGD) WITH PROPOFOL N/A 01/24/2016   Procedure: ESOPHAGOGASTRODUODENOSCOPY (EGD) WITH PROPOFOL;  Surgeon: Wonda Horner, MD;  Location: [facility] ENDOSCOPY;  Service:  Endoscopy;  Laterality: N/A;  . ESOPHAGOGASTRODUODENOSCOPY (EGD) WITH PROPOFOL N/A 09/18/2017   Procedure: ESOPHAGOGASTRODUODENOSCOPY (EGD) WITH PROPOFOL;  Surgeon: Wilford Corner, MD;  Location: [place];  Service: Endoscopy;  Laterality: N/A;  . GIVENS CAPSULE STUDY N/A 04/02/2017   Procedure: GIVENS CAPSULE STUDY;  Surgeon: Arta Silence, MD;  Location: [facility] ENDOSCOPY;  Service: Endoscopy;  Laterality: N/A;  . HERNIA REPAIR    . HOT HEMOSTASIS N/A 01/24/2016   Procedure: HOT HEMOSTASIS (ARGON PLASMA COAGULATION/BICAP);  Surgeon: Wonda Horner, MD;  Location: [facility] ENDOSCOPY;  Service: Endoscopy;  Laterality: N/A;  . IR ANGIOGRAM SELECTIVE EACH ADDITIONAL VESSEL  04/04/2017  . IR Bud ADDITIONAL VESSEL  04/04/2017  . IR EMBO ART  VEN HEMORR LYMPH EXTRAV  INC GUIDE ROADMAPPING  04/04/2017  . IR EMBO ART  VEN HEMORR LYMPH EXTRAV  INC GUIDE ROADMAPPING  04/04/2017  . IR RADIOLOGIST EVAL &  MGMT  05/07/2017  . IR TIPS  04/04/2017  . IR US GUIDE VASC ACCESS RIGHT  04/04/2017  . IR VENOGRAM RENAL UNI RIGHT  04/04/2017  . RADIOLOGY WITH ANESTHESIA N/A 04/04/2017   Procedure: RADIOLOGY WITH ANESTHESIA;  Surgeon: Radiologist, Medication, MD;  Location: [place];  Service: Radiology;  Laterality: N/A;  . RIGHT/LEFT HEART CATH AND CORONARY ANGIOGRAPHY N/A 04/15/2017   Procedure: Right/Left Heart Cath and Coronary Angiography;  Surgeon: Martinique, Peter M, MD;  Location: [place] [facility];  Service: Cardiovascular;  Laterality: N/A;    FAMILY HISTORY: Family History  Problem Relation Age of Onset  . Liver cancer Mother   . Diabetes Mellitus II Sister     SOCIAL HISTORY: Social History   Socioeconomic History  . Marital status: Single    Spouse name: Not on file  . Number of children: 0  . Years of education: 29  . Highest education level: Not on file  Occupational History    Comment: retired  Scientific laboratory technician  . Financial resource strain: Not on file  . Food insecurity:    Worry: Not on  file    Inability: Not on file  . Transportation needs:    Medical: Not on file    Non-medical: Not on file  Tobacco Use  . Smoking status: Former Smoker    Packs/day: 2.00    Years: 40.00    Pack years: 80.00    Types: Cigarettes    Last attempt to quit: 08/14/2008    Years since quitting: 10.2  . Smokeless tobacco: Never Used  Substance and Sexual Activity  . Alcohol use: Yes    Alcohol/week: 0.0 standard drinks    Comment: 11/12/18 quit 2016, 1 pint whiskey daily several beer weekly    01/2016  " no alcohol in 3 weeks "  . Drug use: No    Comment: quit 2016  . Sexual activity: Not on file  Lifestyle  . Physical activity:    Days per week: Not on file    Minutes per session: Not on file  . Stress: Not on file  Relationships  . Social connections:    Talks on phone: Not on file    Gets together: Not on file    Attends religious service: Not on file    Active member of club or organization: Not on file    Attends meetings of clubs or organizations: Not on file    Relationship status: Not on file  . Intimate partner violence:    Fear of current or ex partner: Not on file    Emotionally abused: Not on file    Physically abused: Not on file    Forced sexual activity: Not on file  Other Topics Concern  . Not on file  Social History Narrative   Lives with sig other, Debbie   Caffeine coffee, 2 cups daily     PHYSICAL EXAM  GENERAL EXAM/CONSTITUTIONAL: Vitals:  Vitals:   11/12/18 0901  BP: (!) 141/76  Pulse: (!) 103  Weight: 211 lb 9.6 oz (96 kg)  Height: 6' (1.829 m)     Body mass index is 28.7 kg/m. Wt Readings from Last 3 Encounters:  11/12/18 211 lb 9.6 oz (96 kg)  01/03/18 206 lb 12.7 oz (93.8 kg)  09/18/17 203 lb 3.2 oz (92.2 kg)     Patient is in no distress; well developed, nourished and groomed; neck is supple  CARDIOVASCULAR:  Examination of carotid arteries is normal; no carotid bruits  Regular rate and rhythm; SYSTOLIC HEART  MURMUR  Examination of peripheral vascular system by observation and palpation is normal  EYES:  Ophthalmoscopic exam of optic discs and posterior segments is normal; no papilledema or hemorrhages  Visual Acuity Screening   Right eye Left eye Both eyes  Without correction: 20/100 20/40   With correction:        MUSCULOSKELETAL:  Gait, strength, tone, movements noted in Neurologic exam below  NEUROLOGIC: MENTAL STATUS:  No flowsheet data found.  awake, alert, oriented to person, place and time  recent and remote memory intact  normal attention and concentration  language fluent, comprehension intact, naming intact  fund of knowledge appropriate  CRANIAL NERVE:   2nd - no papilledema on fundoscopic exam  2nd, 3rd, 4th, 6th - pupils equal and reactive to light, visual fields full to confrontation, extraocular muscles intact, no nystagmus  5th - facial sensation symmetric  7th - facial strength symmetric  8th - hearing intact  9th - palate elevates symmetrically, uvula midline  11th - shoulder shrug symmetric  12th - tongue protrusion midline  MOTOR:   normal bulk and tone, full strength in the BUE, BLE; EXCEPT BILATERAL HF 3, KNEE EXT / FLEX 4  SENSORY:   normal and symmetric to light touch; ABSENT VIB AT TOES, ANKLES AND KNEES  COORDINATION:   finger-nose-finger, fine finger movements normal  REFLEXES:   deep tendon reflexes --> BUE 1; KNEES 2; ANKLES TRACE  GAIT/STATION:   ANTALGIC GAIT; UNSTEADY     DIAGNOSTIC DATA (LABS, IMAGING, TESTING) - I reviewed patient records, labs, notes, testing and imaging myself where available.  Lab Results  Component Value Date   WBC 9.4 04/26/2018   HGB 10.9 (L) 04/26/2018   HCT 32.2 (L) 04/26/2018   MCV 93.1 04/26/2018   PLT 140 (L) 04/26/2018      Component Value Date/Time   NA 142 04/26/2018 0315   K 3.6 04/26/2018 0315   CL 112 (H) 04/26/2018 0315   CO2 21 (L) 04/26/2018 0315   GLUCOSE 126  (H) 04/26/2018 0315   BUN 21 (H) 04/26/2018 0315   CREATININE 1.08 04/26/2018 0315   CREATININE 0.84 05/03/2017 1207   CALCIUM 8.5 (L) 04/26/2018 0315   PROT 6.1 (L) 04/25/2018 1533   ALBUMIN 3.2 (L) 04/25/2018 1533   AST 30 04/25/2018 1533   ALT 24 04/25/2018 1533   ALKPHOS 97 04/25/2018 1533   BILITOT 1.4 (H) 04/25/2018 1533   GFRNONAA >60 04/26/2018 0315   GFRNONAA >89 05/03/2017 1207   GFRAA >60 04/26/2018 0315   GFRAA >89 05/03/2017 1207   Lab Results  Component Value Date   CHOL 134 07/14/2015   HDL 16 (L) 07/14/2015   LDLCALC 99 07/14/2015   TRIG 93 07/14/2015   CHOLHDL 8.4 07/14/2015   Lab Results  Component Value Date   HGBA1C 5.6 01/20/2016   Lab Results  Component Value Date   OITGPQDI26 415 07/11/2015   Lab Results  Component Value Date   TSH 1.984 07/11/2015       ASSESSMENT AND PLAN  67 y.o. 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  Ddx: neuropathy (likely due to chronic alcohol abuse) + venous stasis edema in BLE (due to cirrhosis and hypoalbuminemia); ? Lumbar spine dz  1. Neuropathy   2. Alcoholic peripheral neuropathy (HCC)     PLAN:  - neuropathy labs - gabapentin 168m three times a day for neuropathic  pain - follow up EMG/NCS tomorrow (ordered by Dr. Arelia Sneddon) - may consider MRI lumbar spine in future  Orders Placed This Encounter  Procedures  . Vitamin B12  . TSH  . Hemoglobin A1c  . Multiple Myeloma Panel (SPEP&IFE w/QIG)  . CBC with Differential/Platelet  . Comprehensive metabolic panel   Meds ordered this encounter  Medications  . gabapentin (NEURONTIN) 100 MG capsule    Sig: Take 1 capsule (100 mg total) by mouth 3 (three) times daily.    Dispense:  90 capsule    Refill:  6   Return in about 6 months (around 05/14/2019).    Penni Bombard, MD 74/71/8550, 1:58 AM Certified in Neurology, Neurophysiology and Neuroimaging  [facility] [address], [place], [place] [address] [phone]

## 2018-11-12 NOTE — Patient Instructions (Signed)
-   neuropathy labs  - gabapentin 100mg  three times a day for pain  - follow up EMG/NCS tomorrow (ordered by Dr. Arelia Sneddon)

## 2018-11-13 ENCOUNTER — Ambulatory Visit (INDEPENDENT_AMBULATORY_CARE_PROVIDER_SITE_OTHER): Payer: Medicare Other | Admitting: Diagnostic Neuroimaging

## 2018-11-13 ENCOUNTER — Encounter (INDEPENDENT_AMBULATORY_CARE_PROVIDER_SITE_OTHER): Payer: Medicare Other | Admitting: Diagnostic Neuroimaging

## 2018-11-13 DIAGNOSIS — Z0289 Encounter for other administrative examinations: Secondary | ICD-10-CM

## 2018-11-13 DIAGNOSIS — M79604 Pain in right leg: Secondary | ICD-10-CM | POA: Diagnosis not present

## 2018-11-13 DIAGNOSIS — M79605 Pain in left leg: Secondary | ICD-10-CM | POA: Diagnosis not present

## 2018-11-13 NOTE — Progress Notes (Signed)
Cardiology Office Note:    Date:  11/14/2018   ID:  John Warren, DOB 10/05/1951, MRN [id number]  PCP:  Leonard Downing, MD  Cardiologist:  Shirlee More, MD    Referring MD: Leonard Downing, *   Dr. Michail Sermon, please do a lipid profile and proBNP with his next labs in your office  ASSESSMENT:    1. Coronary artery disease involving native coronary artery of native heart with other form of angina pectoris ([place])   2. Nonrheumatic aortic valve stenosis   3. Dyslipidemia    PLAN:    In order of problems listed above:  1. Stable CAD, [place] Heart Association class I not a candidate for revascularization continue medical treatment which includes low-dose aspirin and lipid-lowering therapy. 2. Stable mild aortic stenosis not responsible for his edema or shortness of breath plan repeat echocardiogram in 1 year 3. Stable continue with statin His greatest problem is liver disease he appears to have edema due to cirrhosis and diuretics are managed by his gastroenterologist of caution and be cautious with gabapentin that can cause intense sodium retention  Next appointment: 6 months   Medication Adjustments/Labs and Tests Ordered: Current medicines are reviewed at length with the patient today.  Concerns regarding medicines are outlined above.  Orders Placed This Encounter  Procedures  . EKG 12-Lead   No orders of the defined types were placed in this encounter.   Chief Complaint  Patient presents with  . Follow-up  . Coronary Artery Disease  . Aortic Stenosis    History of Present Illness:    John Warren is a 67 y.o. male with a hx of CAD EF 45-50% mild AS hepatic cirrhosis and GI bleed with Type 2 MI May 2018  last seen by Dr. Wynonia Lawman 6 months ago.  I reviewed Dr. Thurman Coyer records.  Coronary angiography was performed 04/05/2017 showed less than 50% left main proximal LAD and diagonal stenosis 60% proximal left circumflex 80% mid left circumflex 50% proximal  right coronary artery ejection fraction 50%.  His diagnoses include cirrhosis and secondary thrombocytopenia along with iron deficiency anemia and COPD. Echocardiogram 05/23/2018  EF 55% mild LVH aortic stenosis with a valve area 1.47 cm peak gradient 28 mean gradient 16 mmHg.  At the time of heart catheterization is measured mean gradient was 18 valve area 1.46 correlated very nicely with echocardiogram Compliance with diet, lifestyle and medications: Yes most importantly is abstain from alcohol for the last 3 years no longer smokes  He has marked peripheral edema related to liver disease and tells me he often takes 2-3 furosemide a day recently was put on gabapentin for pain control and I warned him that it can cause intense sodium restriction presently is only taking 100 mg at bedtime.  He is short of breath with activities walking outside the home has edema but no orthopnea no chest pain palpitation or syncope.  Labs are drawn predominantly through his GI physician and I will prompt Dr. Neldon Labella to check a proBNP level with his next labs his echocardiogram done in June this year did not show findings of heart failure aortic stenosis is very mild and would not cause sodium retention and shortness of breath like this.  Obviously because of his liver disease he is not a candidate for bypass surgery and would be at nearly prohibitive risk to consider cardiac interventions with continuous dual antiplatelet therapy Past Medical History:  Diagnosis Date  . Alcohol abuse   . Anemia   .  Arthritis   . AVM (arteriovenous malformation) of colon    a. By colonoscopy 07/2015.  Marland Kitchen Cirrhosis of liver ([place])   . Colon polyps   . COPD (chronic obstructive pulmonary disease) ([place])   . Diastolic CHF ([place])   . GERD (gastroesophageal reflux disease)   . GI bleed 01/2016  . Headache    cluster headaches  . History of blood transfusion   . History of hiatal hernia   . Hypertension   . Hypokalemia   . Internal  hemorrhoids   . Portal hypertensive gastropathy ([place])    a. By EGD 07/2015.  . Sigmoid diverticulosis   . SVT (supraventricular tachycardia) ([place])    a. 07/2015 in setting of severe hypokalemia.  . Symptomatic anemia    a. 07/2015 - felt multifactorial from cirrhosis and AVMs.  . Thrombocytopenia ([place])   . Tobacco use     Past Surgical History:  Procedure Laterality Date  . CATARACT EXTRACTION    . COLONOSCOPY N/A 07/15/2015   Procedure: COLONOSCOPY;  Surgeon: Wilford Corner, MD;  Location[facility] END[facility]COPY;  Service: Endoscopy;  Laterality: N/A;  . COLONOSCOPY WITH PROPOFOL N/A 04/04/2017   Procedure: COLONOSCOPY WITH PROPOFOL;  Surgeon: Ronnette Juniper, MD;  Location: [place];  Service: Gastroenterology;  Laterality: N/A;  . ESOPHAGOGASTRODUODENOSCOPY N/A 07/15/2015   Procedure: ESOPHAGOGASTRODUODENOSCOPY (EGD);  Surgeon: Wilford Corner, MD;  Location: [facility] ENDOSCOPY;  Service: Endoscopy;  Laterality: N/A;  . ESOPHAGOGASTRODUODENOSCOPY N/A 12/30/2015   Procedure: ESOPHAGOGASTRODUODENOSCOPY (EGD);  Surgeon: Ronald Lobo, MD;  Location: [facility] ENDOSCOPY;  Service: Endoscopy;  Laterality: N/A;  . ESOPHAGOGASTRODUODENOSCOPY N/A 03/30/2017   Procedure: ESOPHAGOGASTRODUODENOSCOPY (EGD);  Surgeon: Laurence Spates, MD;  Location: [facility] ENDOSCOPY;  Service: Endoscopy;  Laterality: N/A;  . ESOPHAGOGASTRODUODENOSCOPY N/A 04/04/2017   Procedure: ESOPHAGOGASTRODUODENOSCOPY (EGD);  Surgeon: Arta Silence, MD;  Location: [facility] ENDOSCOPY;  Service: Endoscopy;  Laterality: N/A;  . ESOPHAGOGASTRODUODENOSCOPY (EGD) WITH PROPOFOL N/A 01/24/2016   Procedure: ESOPHAGOGASTRODUODENOSCOPY (EGD) WITH PROPOFOL;  Surgeon: Wonda Horner, MD;  Location: [facility] ENDOSCOPY;  Service: Endoscopy;  Laterality: N/A;  . ESOPHAGOGASTRODUODENOSCOPY (EGD) WITH PROPOFOL N/A 09/18/2017   Procedure: ESOPHAGOGASTRODUODENOSCOPY (EGD) WITH PROPOFOL;  Surgeon: Wilford Corner, MD;  Location: [place];  Service: Endoscopy;  Laterality: N/A;  .  GIVENS CAPSULE STUDY N/A 04/02/2017   Procedure: GIVENS CAPSULE STUDY;  Surgeon: Arta Silence, MD;  Location: [facility] ENDOSCOPY;  Service: Endoscopy;  Laterality: N/A;  . HERNIA REPAIR    . HOT HEMOSTASIS N/A 01/24/2016   Procedure: HOT HEMOSTASIS (ARGON PLASMA COAGULATION/BICAP);  Surgeon: Wonda Horner, MD;  Location: [facility] ENDOSCOPY;  Service: Endoscopy;  Laterality: N/A;  . IR ANGIOGRAM SELECTIVE EACH ADDITIONAL VESSEL  04/04/2017  . IR [place] ADDITIONAL VESSEL  04/04/2017  . IR EMBO ART  VEN HEMORR LYMPH EXTRAV  INC GUIDE ROADMAPPING  04/04/2017  . IR EMBO ART  VEN HEMORR LYMPH EXTRAV  INC GUIDE ROADMAPPING  04/04/2017  . IR RADIOLOGIST EVAL & MGMT  05/07/2017  . IR TIPS  04/04/2017  . IR US GUIDE VASC ACCESS RIGHT  04/04/2017  . IR VENOGRAM RENAL UNI RIGHT  04/04/2017  . RADIOLOGY WITH ANESTHESIA N/A 04/04/2017   Procedure: RADIOLOGY WITH ANESTHESIA;  Surgeon: Radiologist, Medication, MD;  Location: [place];  Service: Radiology;  Laterality: N/A;  . RIGHT/LEFT HEART CATH AND CORONARY ANGIOGRAPHY N/A 04/15/2017   Procedure: Right/Left Heart Cath and Coronary Angiography;  Surgeon: Martinique, Peter M, MD;  Location: [place] CV [facility];  Service: Cardiovascular;  Laterality: N/A;    Current  Medications: Current Meds  Medication Sig  . acetaminophen (TYLENOL) 500 MG tablet Take 500-1,000 mg by mouth every 8 (eight) hours as needed (for pain).  Marland Kitchen aspirin EC 81 MG tablet Take 81 mg by mouth daily.  Marland Kitchen atorvastatin (LIPITOR) 20 MG tablet Take 1 tablet (20 mg total) by mouth daily.  . famotidine (PEPCID) 20 MG tablet 20 mg as needed.   . ferrous sulfate 325 (65 FE) MG tablet Take 1 tablet (325 mg total) by mouth 3 (three) times daily with meals. (Patient taking differently: Take 325 mg by mouth 2 (two) times daily with a meal. )  . folic acid (FOLVITE) 1 MG tablet Take 1 tablet (1 mg total) by mouth daily.  . furosemide (LASIX) 80 MG tablet Take 80 mg by mouth daily.  Marland Kitchen gabapentin (NEURONTIN) 100 MG  capsule Take 1 capsule (100 mg total) by mouth 3 (three) times daily.  Marland Kitchen lactulose (CHRONULAC) 10 GM/15ML solution Take 45 mLs (30 g total) by mouth 3 (three) times daily.  . nitroGLYCERIN (NITROSTAT) 0.4 MG SL tablet Place 0.4 mg under the tongue every 5 (five) minutes as needed for chest pain.  . nortriptyline (PAMELOR) 10 MG capsule 10 mg at bedtime.  Marland Kitchen omeprazole (PRILOSEC) 40 MG capsule Take 40 mg by mouth daily.  . potassium chloride SA (K-DUR,KLOR-CON) 20 MEQ tablet Take 20 mEq by mouth every morning.   . ranitidine (ZANTAC) 150 MG capsule Take 150 mg by mouth every evening.  . rifaximin (XIFAXAN) 550 MG TABS tablet Take 1 tablet (550 mg total) by mouth 2 (two) times daily.  Marland Kitchen spironolactone (ALDACTONE) 50 MG tablet Take 1 tablet (50 mg total) by mouth daily.  Marland Kitchen thiamine 100 MG tablet Take 1 tablet (100 mg total) by mouth daily. (Patient taking differently: Take 125 mg by mouth daily. )  . traMADol (ULTRAM) 50 MG tablet Take 50 mg by mouth every 6 (six) hours as needed (for pain).     Allergies:   Penicillins   Social History   Socioeconomic History  . Marital status: Single    Spouse name: Not on file  . Number of children: 0  . Years of education: 67  . Highest education level: Not on file  Occupational History    Comment: retired  Scientific laboratory technician  . Financial resource strain: Not on file  . Food insecurity:    Worry: Not on file    Inability: Not on file  . Transportation needs:    Medical: Not on file    Non-medical: Not on file  Tobacco Use  . Smoking status: Former Smoker    Packs/day: 2.00    Years: 40.00    Pack years: 80.00    Types: Cigarettes    Last attempt to quit: 08/14/2008    Years since quitting: 10.2  . Smokeless tobacco: Never Used  Substance and Sexual Activity  . Alcohol use: Yes    Alcohol/week: 0.0 standard drinks    Comment: 11/12/18 quit 2016, 1 pint whiskey daily several beer weekly    01/2016  " no alcohol in 3 weeks "  . Drug use: No     Comment: quit 2016  . Sexual activity: Not on file  Lifestyle  . Physical activity:    Days per week: Not on file    Minutes per session: Not on file  . Stress: Not on file  Relationships  . Social connections:    Talks on phone: Not on file    Gets  together: Not on file    Attends religious service: Not on file    Active member of club or organization: Not on file    Attends meetings of clubs or organizations: Not on file    Relationship status: Not on file  Other Topics Concern  . Not on file  Social History Narrative   Lives with sig other, Debbie   Caffeine coffee, 2 cups daily     Family History: The patient's  family history includes Diabetes Mellitus II in his sister; Liver cancer in his mother. ROS:   Please see the history of present illness.    All other systems reviewed and are negative.  EKGs/Labs/Other Studies Reviewed:    The following studies were reviewed today:  EKG:  EKG ordered today.  The ekg ordered today demonstrates sinus rhythm and remains normal Recent Labs: 01/03/2018: B Natriuretic Peptide 146.1 04/25/2018: Magnesium 2.0 11/12/2018: ALT 32; BUN 10; Creatinine, Ser 1.00; Hemoglobin 14.3; Platelets 143; Potassium 5.3; Sodium 134; TSH 2.900  Recent Lipid Panel    Component Value Date/Time   CHOL 134 07/14/2015 0516   TRIG 93 07/14/2015 0516   HDL 16 (L) 07/14/2015 0516   CHOLHDL 8.4 07/14/2015 0516   VLDL 19 07/14/2015 0516   LDLCALC 99 07/14/2015 0516    Physical Exam:    VS:  BP 120/64 (BP Location: Right Arm, Patient Position: Sitting, Cuff Size: Normal)   Pulse (!) 103   Ht 6' (1.829 m)   Wt 207 lb (93.9 kg)   SpO2 99%   BMI 28.07 kg/m     Wt Readings from Last 3 Encounters:  11/14/18 207 lb (93.9 kg)  11/12/18 211 lb 9.6 oz (96 kg)  01/03/18 206 lb 12.7 oz (93.8 kg)     GEN: looks chronically ill in no acute distress HEENT: Normal NECK: No JVD; No carotid bruits LYMPHATICS: No lymphadenopathy CARDIAC: 1/6 SEM no S#  localized, mid peak S2 normal RRR, rubs, gallops RESPIRATORY:  Clear to auscultation without rales, wheezing or rhonchi  ABDOMEN: Soft, non-tender, non-distended MUSCULOSKELETAL.4+ lower extremity bilateral edema; No deformity  SKIN: Warm and dry NEUROLOGIC:  Alert and oriented x 3 PSYCHIATRIC:  Normal affect    Signed, Shirlee More, MD  11/14/2018 9:20 AM    [place]

## 2018-11-14 ENCOUNTER — Encounter: Payer: Self-pay | Admitting: Cardiology

## 2018-11-14 ENCOUNTER — Ambulatory Visit: Payer: Medicare Other | Admitting: Cardiology

## 2018-11-14 VITALS — BP 120/64 | HR 103 | Ht 72.0 in | Wt 207.0 lb

## 2018-11-14 DIAGNOSIS — I350 Nonrheumatic aortic (valve) stenosis: Secondary | ICD-10-CM

## 2018-11-14 DIAGNOSIS — I25118 Atherosclerotic heart disease of native coronary artery with other forms of angina pectoris: Secondary | ICD-10-CM | POA: Diagnosis not present

## 2018-11-14 DIAGNOSIS — E785 Hyperlipidemia, unspecified: Secondary | ICD-10-CM

## 2018-11-14 NOTE — Patient Instructions (Signed)

## 2018-11-17 LAB — COMPREHENSIVE METABOLIC PANEL
ALBUMIN: 3.9 g/dL (ref 3.6–4.8)
ALT: 32 IU/L (ref 0–44)
AST: 35 IU/L (ref 0–40)
Albumin/Globulin Ratio: 1.4 (ref 1.2–2.2)
Alkaline Phosphatase: 105 IU/L (ref 39–117)
BUN / CREAT RATIO: 10 (ref 10–24)
BUN: 10 mg/dL (ref 8–27)
Bilirubin Total: 1 mg/dL (ref 0.0–1.2)
CO2: 24 mmol/L (ref 20–29)
Calcium: 9.8 mg/dL (ref 8.6–10.2)
Chloride: 97 mmol/L (ref 96–106)
Creatinine, Ser: 1 mg/dL (ref 0.76–1.27)
GFR calc Af Amer: 88 mL/min/{1.73_m2} (ref >59)
GFR calc non Af Amer: 76 mL/min/{1.73_m2} (ref >59)
GLOBULIN, TOTAL: 2.8 g/dL (ref 1.5–4.5)
GLUCOSE: 215 mg/dL — AB (ref 65–99)
Potassium: 5.3 mmol/L — ABNORMAL HIGH (ref 3.5–5.2)
Sodium: 134 mmol/L (ref 134–144)
Total Protein: 6.7 g/dL (ref 6.0–8.5)

## 2018-11-17 LAB — MULTIPLE MYELOMA PANEL, SERUM
Albumin SerPl Elph-Mcnc: 3.4 g/dL (ref 2.9–4.4)
Albumin/Glob SerPl: 1.1 (ref 0.7–1.7)
Alpha 1: 0.3 g/dL (ref 0.0–0.4)
Alpha2 Glob SerPl Elph-Mcnc: 0.7 g/dL (ref 0.4–1.0)
B-Globulin SerPl Elph-Mcnc: 0.9 g/dL (ref 0.7–1.3)
GAMMA GLOB SERPL ELPH-MCNC: 1.4 g/dL (ref 0.4–1.8)
Globulin, Total: 3.3 g/dL (ref 2.2–3.9)
IGM (IMMUNOGLOBULIN M), SRM: 98 mg/dL (ref 20–172)
IgA/Immunoglobulin A, Serum: 351 mg/dL (ref 61–437)
IgG (Immunoglobin G), Serum: 1312 mg/dL (ref 700–1600)

## 2018-11-17 LAB — HEMOGLOBIN A1C
Est. average glucose Bld gHb Est-mCnc: 154 mg/dL
HEMOGLOBIN A1C: 7 % — AB (ref 4.8–5.6)

## 2018-11-17 LAB — CBC WITH DIFFERENTIAL/PLATELET
Basophils Absolute: 0.1 10*3/uL (ref 0.0–0.2)
Basos: 1 %
EOS (ABSOLUTE): 0.3 10*3/uL (ref 0.0–0.4)
Eos: 4 %
HEMATOCRIT: 42 % (ref 37.5–51.0)
HEMOGLOBIN: 14.3 g/dL (ref 13.0–17.7)
IMMATURE GRANULOCYTES: 0 %
Immature Grans (Abs): 0 10*3/uL (ref 0.0–0.1)
LYMPHS: 19 %
Lymphocytes Absolute: 1.5 10*3/uL (ref 0.7–3.1)
MCH: 31.9 pg (ref 26.6–33.0)
MCHC: 34 g/dL (ref 31.5–35.7)
MCV: 94 fL (ref 79–97)
Monocytes Absolute: 1.1 10*3/uL — ABNORMAL HIGH (ref 0.1–0.9)
Monocytes: 13 %
NEUTROS ABS: 5.2 10*3/uL (ref 1.4–7.0)
NEUTROS PCT: 63 %
PLATELETS: 143 10*3/uL — AB (ref 150–450)
RBC: 4.48 x10E6/uL (ref 4.14–5.80)
RDW: 13.3 % (ref 12.3–15.4)
WBC: 8.2 10*3/uL (ref 3.4–10.8)

## 2018-11-17 LAB — TSH: TSH: 2.9 u[IU]/mL (ref 0.450–4.500)

## 2018-11-17 LAB — VITAMIN B12: VITAMIN B 12: 636 pg/mL (ref 232–1245)

## 2018-11-18 ENCOUNTER — Telehealth: Payer: Self-pay | Admitting: Cardiology

## 2018-11-18 ENCOUNTER — Telehealth: Payer: Self-pay | Admitting: *Deleted

## 2018-11-18 NOTE — Telephone Encounter (Signed)
Will have Dr Bettina Gavia sign order and will fax to PCP-Dr Arelia Sneddon.

## 2018-11-18 NOTE — Telephone Encounter (Signed)
[facility] called, Dr. Bettina Gavia asked them to draw lipid panel on patient, they are not primary. Primary is Dr. Arelia Sneddon, so note needs to go to him.

## 2018-11-18 NOTE — Telephone Encounter (Signed)
Spoke with patient and reviewed his lab results. Advised him that Dr Leta Baptist advised he is anemic and diabetic. He needs to see his PCP for management. The patient asked for lab results to be sent to PCP. He stated he would call today to get an appointment. He verbalized understanding, appreciation of call.  Labs faxed to Dr Claris Gower, PCP for review.

## 2018-11-18 NOTE — Procedures (Signed)
[facility]  NCS (NERVE CONDUCTION STUDY) WITH EMG (ELECTROMYOGRAPHY) REPORT   STUDY DATE: 11/13/18 PATIENT NAME: John Warren DOB: 1951/07/24 MRN: [id number]  ORDERING CLINICIAN: Gwyneth Revels, MD  TECHNOLOGIST: Oneita Jolly ELECTROMYOGRAPHER: Earlean Polka. Tanijah Morais, MD  CLINICAL INFORMATION: 67 year old male with lower extremity pain.  FINDINGS: NERVE CONDUCTION STUDY:  Left ulnar motor response has prolonged distal latency, normal amplitude, slow conduction velocity.  Bilateral peroneal and bilateral tibial motor responses could not be obtained.  Bilateral peroneal motor responses recording over tibialis anterior muscles has normal distal latencies, decreased amplitudes and slow conduction velocity in the right and normal conduction velocity in the left.  Left ulnar and left radial sensory response has prolonged peak latency and decreased amplitude.  Bilateral sural and superficial peroneal sensory responses cannot be obtained.   NEEDLE ELECTROMYOGRAPHY:  Left tibialis anterior and vastus medialis muscles are normal.   IMPRESSION:   Abnormal study demonstrating: - Severe length dependent axonal sensorimotor polyneuropathy.    INTERPRETING PHYSICIAN:  Penni Bombard, MD Certified in Neurology, Neurophysiology and Neuroimaging  [facility] [address] [place], [place] [address] [phone]   [facility]    Nerve / Sites Muscle Latency Ref. Amplitude Ref. Rel Amp Segments Distance Velocity Ref. Area    ms ms mV mV %  cm m/s m/s mVms  L Ulnar - ADM     Wrist ADM 3.4 ?3.3 7.6 ?6.0 100 Wrist - ADM 7   28.9     B.Elbow ADM 8.7  6.1  80.5 B.Elbow - Wrist 22 41 ?49 24.4     A.Elbow ADM 11.2  6.5  106 A.Elbow - B.Elbow 10 40 ?49 25.7         A.Elbow - Wrist      L Peroneal - EDB     Ankle EDB NR ?6.5 NR ?2.0 NR Ankle - EDB 9   NR     Fib head EDB NR  NR  NR Fib head - Ankle 33 NR ?44 NR  R Peroneal - EDB     Ankle EDB  NR ?6.5 NR ?2.0 NR Ankle - EDB 9   NR     Fib head EDB NR  NR  NR Fib head - Ankle 33 NR ?44 NR  L Tibial - AH     Ankle AH NR ?5.8 NR ?4.0 NR Ankle - AH 9   NR     Pop fossa AH NR  NR  NR Pop fossa - Ankle 40 NR ?41 NR  R Tibial - AH     Ankle AH NR ?5.8 NR ?4.0 NR Ankle - AH 9   NR     Pop fossa AH NR  NR  NR Pop fossa - Ankle 40 NR ?41 NR  L Peroneal - Tib Ant     Fib Head Tib Ant 3.4 ?4.7 1.3 ?3.0 100 Fib Head - Tib Ant 10   7.2     Pop fossa Tib Ant 5.6  1.4  110 Pop fossa - Fib Head 10 46 ?44 9.4  R Peroneal - Tib Ant     Fib Head Tib Ant 3.2 ?4.7 2.8 ?3.0 100 Fib Head - Tib Ant 10   27.9     Pop fossa Tib Ant 5.7  2.0  72.4 Pop fossa - Fib Head 10 39 ?44 17.0                   SNC  Nerve / Sites Rec. Site Peak Lat Ref.  Amp Ref. Segments Distance    ms ms V V  cm  L Radial - Anatomical snuff box (Forearm)     Forearm Wrist 3.2 ?2.9 8 ?15 Forearm - Wrist 10  L Sural - Ankle (Calf)     Calf Ankle NR ?4.4 NR ?6 Calf - Ankle 14  R Sural - Ankle (Calf)     Calf Ankle NR ?4.4 NR ?6 Calf - Ankle 14  R Superficial peroneal - Ankle     Lat leg Ankle NR ?4.4 NR ?6 Lat leg - Ankle 14  L Superficial peroneal - Ankle     Lat leg Ankle NR ?4.4 NR ?6 Lat leg - Ankle 14  L Ulnar - Orthodromic, (Dig V, Mid palm)     Dig V Wrist 3.3 ?3.1 4 ?5 Dig V - Wrist 6                 F  Wave    Nerve F Lat Ref.   ms ms  L Ulnar - ADM 38.9 ?32.0       EMG full       EMG Summary Table    Spontaneous MUAP Recruitment  Muscle IA Fib PSW Fasc Other Amp Dur. Poly Pattern  L. Vastus medialis Normal None None None _______ Normal Normal Normal Normal  L. Tibialis anterior Normal None None None _______ Normal Normal Normal Normal

## 2018-11-29 ENCOUNTER — Inpatient Hospital Stay (HOSPITAL_COMMUNITY)
Admission: EM | Admit: 2018-11-29 | Discharge: 2018-12-01 | DRG: 442 | Disposition: A | Discharge status: Home Health | Payer: Medicare Other | Attending: Internal Medicine | Admitting: Internal Medicine

## 2018-11-29 ENCOUNTER — Inpatient Hospital Stay (HOSPITAL_COMMUNITY): Payer: Medicare Other

## 2018-11-29 ENCOUNTER — Other Ambulatory Visit: Payer: Self-pay

## 2018-11-29 ENCOUNTER — Emergency Department (HOSPITAL_COMMUNITY): Payer: Medicare Other

## 2018-11-29 ENCOUNTER — Encounter (HOSPITAL_COMMUNITY): Payer: Self-pay | Admitting: Emergency Medicine

## 2018-11-29 DIAGNOSIS — R278 Other lack of coordination: Secondary | ICD-10-CM | POA: Diagnosis present

## 2018-11-29 DIAGNOSIS — Z880 Allergy status to penicillin: Secondary | ICD-10-CM

## 2018-11-29 DIAGNOSIS — L03115 Cellulitis of right lower limb: Secondary | ICD-10-CM | POA: Diagnosis present

## 2018-11-29 DIAGNOSIS — E785 Hyperlipidemia, unspecified: Secondary | ICD-10-CM | POA: Diagnosis not present

## 2018-11-29 DIAGNOSIS — E119 Type 2 diabetes mellitus without complications: Secondary | ICD-10-CM | POA: Diagnosis present

## 2018-11-29 DIAGNOSIS — K7210 Chronic hepatic failure without coma: Principal | ICD-10-CM | POA: Diagnosis present

## 2018-11-29 DIAGNOSIS — I2510 Atherosclerotic heart disease of native coronary artery without angina pectoris: Secondary | ICD-10-CM | POA: Diagnosis not present

## 2018-11-29 DIAGNOSIS — Z800 Family history of malignant neoplasm of digestive organs: Secondary | ICD-10-CM | POA: Diagnosis not present

## 2018-11-29 DIAGNOSIS — J449 Chronic obstructive pulmonary disease, unspecified: Secondary | ICD-10-CM | POA: Diagnosis present

## 2018-11-29 DIAGNOSIS — Z8719 Personal history of other diseases of the digestive system: Secondary | ICD-10-CM | POA: Diagnosis not present

## 2018-11-29 DIAGNOSIS — I110 Hypertensive heart disease with heart failure: Secondary | ICD-10-CM | POA: Diagnosis present

## 2018-11-29 DIAGNOSIS — Z87891 Personal history of nicotine dependence: Secondary | ICD-10-CM

## 2018-11-29 DIAGNOSIS — Z79899 Other long term (current) drug therapy: Secondary | ICD-10-CM

## 2018-11-29 DIAGNOSIS — K3189 Other diseases of stomach and duodenum: Secondary | ICD-10-CM | POA: Diagnosis not present

## 2018-11-29 DIAGNOSIS — I5022 Chronic systolic (congestive) heart failure: Secondary | ICD-10-CM | POA: Diagnosis present

## 2018-11-29 DIAGNOSIS — K7682 Hepatic encephalopathy: Secondary | ICD-10-CM | POA: Diagnosis present

## 2018-11-29 DIAGNOSIS — K5520 Angiodysplasia of colon without hemorrhage: Secondary | ICD-10-CM | POA: Diagnosis not present

## 2018-11-29 DIAGNOSIS — K5730 Diverticulosis of large intestine without perforation or abscess without bleeding: Secondary | ICD-10-CM | POA: Diagnosis not present

## 2018-11-29 DIAGNOSIS — Z9849 Cataract extraction status, unspecified eye: Secondary | ICD-10-CM | POA: Diagnosis not present

## 2018-11-29 DIAGNOSIS — K219 Gastro-esophageal reflux disease without esophagitis: Secondary | ICD-10-CM | POA: Diagnosis not present

## 2018-11-29 DIAGNOSIS — I5042 Chronic combined systolic (congestive) and diastolic (congestive) heart failure: Secondary | ICD-10-CM | POA: Diagnosis not present

## 2018-11-29 DIAGNOSIS — K7030 Alcoholic cirrhosis of liver without ascites: Secondary | ICD-10-CM | POA: Diagnosis not present

## 2018-11-29 DIAGNOSIS — I350 Nonrheumatic aortic (valve) stenosis: Secondary | ICD-10-CM | POA: Diagnosis present

## 2018-11-29 DIAGNOSIS — K766 Portal hypertension: Secondary | ICD-10-CM | POA: Diagnosis not present

## 2018-11-29 DIAGNOSIS — K5900 Constipation, unspecified: Secondary | ICD-10-CM | POA: Diagnosis present

## 2018-11-29 DIAGNOSIS — M1990 Unspecified osteoarthritis, unspecified site: Secondary | ICD-10-CM | POA: Diagnosis present

## 2018-11-29 DIAGNOSIS — Z7982 Long term (current) use of aspirin: Secondary | ICD-10-CM

## 2018-11-29 DIAGNOSIS — K7290 Hepatic failure, unspecified without coma: Secondary | ICD-10-CM | POA: Diagnosis present

## 2018-11-29 DIAGNOSIS — L0390 Cellulitis, unspecified: Secondary | ICD-10-CM

## 2018-11-29 DIAGNOSIS — Z833 Family history of diabetes mellitus: Secondary | ICD-10-CM

## 2018-11-29 DIAGNOSIS — R4182 Altered mental status, unspecified: Secondary | ICD-10-CM | POA: Diagnosis present

## 2018-11-29 LAB — COMPREHENSIVE METABOLIC PANEL
ALT: 23 U/L (ref 0–44)
AST: 27 U/L (ref 15–41)
Albumin: 3.2 g/dL — ABNORMAL LOW (ref 3.5–5.0)
Alkaline Phosphatase: 84 U/L (ref 38–126)
Anion gap: 10 (ref 5–15)
BUN: 16 mg/dL (ref 8–23)
CO2: 21 mmol/L — ABNORMAL LOW (ref 22–32)
Calcium: 9.2 mg/dL (ref 8.9–10.3)
Chloride: 102 mmol/L (ref 98–111)
Creatinine, Ser: 1.15 mg/dL (ref 0.61–1.24)
GFR calc Af Amer: >60 mL/min (ref >60)
GFR calc non Af Amer: >60 mL/min (ref >60)
GLUCOSE: 288 mg/dL — AB (ref 70–99)
Potassium: 4.4 mmol/L (ref 3.5–5.1)
Sodium: 133 mmol/L — ABNORMAL LOW (ref 135–145)
Total Bilirubin: 1.3 mg/dL — ABNORMAL HIGH (ref 0.3–1.2)
Total Protein: 6.6 g/dL (ref 6.5–8.1)

## 2018-11-29 LAB — CBC WITH DIFFERENTIAL/PLATELET
Abs Immature Granulocytes: 0.04 10*3/uL (ref 0.00–0.07)
Basophils Absolute: 0.1 10*3/uL (ref 0.0–0.1)
Basophils Relative: 1 %
Eosinophils Absolute: 0.4 10*3/uL (ref 0.0–0.5)
Eosinophils Relative: 4 %
HEMATOCRIT: 42 % (ref 39.0–52.0)
HEMOGLOBIN: 14.1 g/dL (ref 13.0–17.0)
Immature Granulocytes: 0 %
LYMPHS PCT: 14 %
Lymphs Abs: 1.4 10*3/uL (ref 0.7–4.0)
MCH: 31.6 pg (ref 26.0–34.0)
MCHC: 33.6 g/dL (ref 30.0–36.0)
MCV: 94.2 fL (ref 80.0–100.0)
Monocytes Absolute: 1.1 10*3/uL — ABNORMAL HIGH (ref 0.1–1.0)
Monocytes Relative: 12 %
Neutro Abs: 6.9 10*3/uL (ref 1.7–7.7)
Neutrophils Relative %: 69 %
Platelets: 162 10*3/uL (ref 150–400)
RBC: 4.46 MIL/uL (ref 4.22–5.81)
RDW: 13.4 % (ref 11.5–15.5)
WBC: 9.9 10*3/uL (ref 4.0–10.5)
nRBC: 0 % (ref 0.0–0.2)

## 2018-11-29 LAB — I-STAT CG4 LACTIC ACID, ED
LACTIC ACID, VENOUS: 2.02 mmol/L — AB (ref 0.5–1.9)
LACTIC ACID, VENOUS: 1.5 mmol/L (ref 0.5–1.9)

## 2018-11-29 LAB — PROTIME-INR
INR: 1.09
Prothrombin Time: 14 seconds (ref 11.4–15.2)

## 2018-11-29 LAB — GLUCOSE, CAPILLARY
Glucose-Capillary: 227 mg/dL — ABNORMAL HIGH (ref 70–99)
Glucose-Capillary: 155 mg/dL — ABNORMAL HIGH (ref 70–99)

## 2018-11-29 LAB — AMMONIA: AMMONIA: 119 umol/L — AB (ref 9–35)

## 2018-11-29 LAB — ETHANOL: Alcohol, Ethyl (B): <10 mg/dL (ref <10)

## 2018-11-29 MED ORDER — SODIUM CHLORIDE 0.9% FLUSH
3.0000 mL | Freq: Two times a day (BID) | INTRAVENOUS | Status: DC
  Administered 2018-11-29 – 2018-12-01 (×4): 3 mL via INTRAVENOUS

## 2018-11-29 MED ORDER — LACTATED RINGERS IV SOLN
INTRAVENOUS | Status: DC
  Administered 2018-11-29: 13:00:00 via INTRAVENOUS

## 2018-11-29 MED ORDER — INSULIN ASPART 100 UNIT/ML ~~LOC~~ SOLN
0.0000 [IU] | Freq: Three times a day (TID) | SUBCUTANEOUS | Status: DC
  Administered 2018-11-30: 3 [IU] via SUBCUTANEOUS
  Administered 2018-11-30: 2 [IU] via SUBCUTANEOUS
  Administered 2018-11-30: 7 [IU] via SUBCUTANEOUS
  Administered 2018-12-01: 10 [IU] via SUBCUTANEOUS
  Administered 2018-12-01: 2 [IU] via SUBCUTANEOUS
  Administered 2018-12-01: 7 [IU] via SUBCUTANEOUS

## 2018-11-29 MED ORDER — ASPIRIN EC 81 MG PO TBEC
81.0000 mg | DELAYED_RELEASE_TABLET | Freq: Every day | ORAL | Status: DC
  Administered 2018-11-30 – 2018-12-01 (×2): 81 mg via ORAL
  Filled 2018-11-29 (×3): qty 1

## 2018-11-29 MED ORDER — INSULIN ASPART 100 UNIT/ML ~~LOC~~ SOLN
0.0000 [IU] | Freq: Every day | SUBCUTANEOUS | Status: DC
  Administered 2018-11-30: 5 [IU] via SUBCUTANEOUS

## 2018-11-29 MED ORDER — HEPARIN SODIUM (PORCINE) 5000 UNIT/ML IJ SOLN
5000.0000 [IU] | Freq: Three times a day (TID) | INTRAMUSCULAR | Status: DC
  Administered 2018-11-29 – 2018-12-01 (×6): 5000 [IU] via SUBCUTANEOUS
  Filled 2018-11-29 (×6): qty 1

## 2018-11-29 MED ORDER — FUROSEMIDE 20 MG PO TABS
80.0000 mg | ORAL_TABLET | Freq: Every day | ORAL | Status: DC
  Administered 2018-11-30 – 2018-12-01 (×2): 80 mg via ORAL
  Filled 2018-11-29 (×2): qty 4

## 2018-11-29 MED ORDER — CEPHALEXIN 250 MG PO CAPS
500.0000 mg | ORAL_CAPSULE | Freq: Two times a day (BID) | ORAL | Status: DC
  Administered 2018-11-29 – 2018-12-01 (×4): 500 mg via ORAL
  Filled 2018-11-29 (×5): qty 2

## 2018-11-29 MED ORDER — ATORVASTATIN CALCIUM 10 MG PO TABS
20.0000 mg | ORAL_TABLET | Freq: Every day | ORAL | Status: DC
  Administered 2018-11-30 – 2018-12-01 (×2): 20 mg via ORAL
  Filled 2018-11-29 (×2): qty 2

## 2018-11-29 MED ORDER — SODIUM CHLORIDE 0.9 % IV BOLUS
500.0000 mL | Freq: Once | INTRAVENOUS | Status: AC
  Administered 2018-11-29: 500 mL via INTRAVENOUS

## 2018-11-29 MED ORDER — SPIRONOLACTONE 50 MG PO TABS
50.0000 mg | ORAL_TABLET | Freq: Every day | ORAL | Status: DC
  Administered 2018-11-30 – 2018-12-01 (×2): 50 mg via ORAL
  Filled 2018-11-29 (×2): qty 1
  Filled 2018-11-29 (×2): qty 2

## 2018-11-29 MED ORDER — FERROUS SULFATE 325 (65 FE) MG PO TABS
325.0000 mg | ORAL_TABLET | Freq: Two times a day (BID) | ORAL | Status: DC
  Administered 2018-11-30 – 2018-12-01 (×4): 325 mg via ORAL
  Filled 2018-11-29 (×4): qty 1

## 2018-11-29 MED ORDER — PANTOPRAZOLE SODIUM 40 MG PO TBEC
80.0000 mg | DELAYED_RELEASE_TABLET | Freq: Every day | ORAL | Status: DC
  Administered 2018-11-30 – 2018-12-01 (×2): 80 mg via ORAL
  Filled 2018-11-29 (×2): qty 2

## 2018-11-29 MED ORDER — RIFAXIMIN 550 MG PO TABS
550.0000 mg | ORAL_TABLET | Freq: Every day | ORAL | Status: DC
  Filled 2018-11-29: qty 1

## 2018-11-29 MED ORDER — FOLIC ACID 1 MG PO TABS
1.0000 mg | ORAL_TABLET | Freq: Every day | ORAL | Status: DC
  Administered 2018-11-29 – 2018-12-01 (×3): 1 mg via ORAL
  Filled 2018-11-29 (×3): qty 1

## 2018-11-29 MED ORDER — LACTULOSE 10 GM/15ML PO SOLN
10.0000 g | Freq: Once | ORAL | Status: AC
  Administered 2018-11-29: 10 g via ORAL
  Filled 2018-11-29: qty 15

## 2018-11-29 MED ORDER — LACTULOSE 10 GM/15ML PO SOLN
30.0000 g | Freq: Three times a day (TID) | ORAL | Status: DC
  Administered 2018-11-29 – 2018-11-30 (×2): 30 g via ORAL
  Filled 2018-11-29 (×2): qty 45

## 2018-11-29 MED ORDER — VITAMIN B-1 100 MG PO TABS
100.0000 mg | ORAL_TABLET | Freq: Every day | ORAL | Status: DC
  Administered 2018-11-29 – 2018-12-01 (×3): 100 mg via ORAL
  Filled 2018-11-29 (×3): qty 1

## 2018-11-29 NOTE — ED Notes (Signed)
Patient is resting comfortably. Family updated on plan of care , no complaints at this time

## 2018-11-29 NOTE — Progress Notes (Signed)
John Warren is a 67 y.o. male patient admitted from ED awake, alert - oriented  X 4 - no acute distress noted.  VSS - Blood pressure 130/65, pulse 92, temperature (!) 97.3 F (36.3 C), temperature source Axillary, resp. rate 13, weight 88.1 kg, SpO2 100 %.    IV in place, occlusive dsg intact without redness.  Orientation to room, and floor completed with information packet given to patient/family.  Patient declined safety video at this time.  Admission INP armband ID verified with patient/family, and in place.   SR up x 2, fall assessment complete, with patient and family able to verbalize understanding of risk associated with falls, and verbalized understanding to call nsg before up out of bed.  Call light within reach, patient able to voice, and demonstrate understanding.      Will cont to eval and treat per MD orders.  Valorie Roosevelt, RN 11/29/2018 8:52 PM

## 2018-11-29 NOTE — ED Notes (Signed)
Condom Cath placed on pt. 

## 2018-11-29 NOTE — ED Notes (Signed)
Patient transported to CT 

## 2018-11-29 NOTE — Plan of Care (Signed)
Pt afebrile

## 2018-11-29 NOTE — ED Provider Notes (Signed)
Emergency Department Provider Note   I have reviewed the triage vital signs and the nursing notes.   HISTORY  Chief Complaint Altered Mental Status   HPI John Warren is a 67 y.o. male with PMH of alcohol abuse, liver cirrhosis, and hepatic encephalopathy presents to the emergency department for evaluation of progressively worsening altered mental status.  Family reports that patient has become more altered since Christmas.  He has a history of liver cirrhosis and hepatic encephalopathy.  Patient denies any headache, weakness, chest pain, abdominal pain.  EMS was called to the house today when sleepiness did not improve.  Unknown if the patient continues to drink alcohol or if he is compliant with his home medications.   Level 5 caveat applies with encephalopathy.    Past Medical History:  Diagnosis Date  . Alcohol abuse   . Anemia   . Arthritis   . AVM (arteriovenous malformation) of colon    a. By colonoscopy 07/2015.  Marland Kitchen Cirrhosis of liver ([place])   . Colon polyps   . COPD (chronic obstructive pulmonary disease) ([place])   . Diastolic CHF ([place])   . GERD (gastroesophageal reflux disease)   . GI bleed 01/2016  . Headache    cluster headaches  . History of blood transfusion   . History of hiatal hernia   . Hypertension   . Hypokalemia   . Internal hemorrhoids   . Portal hypertensive gastropathy ([place])    a. By EGD 07/2015.  . Sigmoid diverticulosis   . SVT (supraventricular tachycardia) ([place])    a. 07/2015 in setting of severe hypokalemia.  . Symptomatic anemia    a. 07/2015 - felt multifactorial from cirrhosis and AVMs.  . Thrombocytopenia ([place])   . Tobacco use     Patient Active Problem List   Diagnosis Date Noted  . Aortic stenosis 11/14/2018  . Dyslipidemia 11/14/2018  . Hepatic encephalopathy ([place]) 01/03/2018  . Hypervolemia   . Coronary artery disease   . NSVT (nonsustained ventricular tachycardia) ([place])   . Acute combined systolic and diastolic heart failure  ([place]) 04/07/2017  . Non-ST elevation (NSTEMI) myocardial infarction ([place]) 04/07/2017  . Cirrhosis of liver with ascites ([place])   . Varices, esophageal ([place])   . Acute upper GI bleeding 12/29/2015  . AVM (arteriovenous malformation) of colon   . Internal hemorrhoids   . Sigmoid diverticulosis   . Alcohol use disorder   . Tobacco use disorder   . Thrombocytopenia ([place])   . COPD (chronic obstructive pulmonary disease) ([place]) 07/13/2015  . Iron deficiency anemia due to chronic blood loss 07/11/2015    Past Surgical History:  Procedure Laterality Date  . CATARACT EXTRACTION    . COLONOSCOPY N/A 07/15/2015   Procedure: COLONOSCOPY;  Surgeon: Wilford Corner, MD;  Location[facility]COPY;  Service: Endoscopy;  Laterality: N/A;  . COLONOSCOPY WITH PROPOFOL N/A 04/04/2017   Procedure: COLONOSCOPY WITH PROPOFOL;  Surgeon: Ronnette Juniper, MD;  Location: [place];  Service: Gastroenterology;  Laterality: N/A;  . ESOPHAGOGASTRODUODENOSCOPY N/A 07/15/2015   Procedure: ESOPHAGOGASTRODUODENOSCOPY (EGD);  Surgeon: Wilford Corner, MD;  Location: [place] ENDOSCOPY;  Service: Endoscopy;  Laterality: N/A;  . ESOPHAGOGASTRODUODENOSCOPY N/A 12/30/2015   Procedure: ESOPHAGOGASTRODUODENOSCOPY (EGD);  Surgeon: Ronald Lobo, MD;  Location: [facility] ENDOSCOPY;  Service: Endoscopy;  Laterality: N/A;  . ESOPHAGOGASTRODUODENOSCOPY N/A 03/30/2017   Procedure: ESOPHAGOGASTRODUODENOSCOPY (EGD);  Surgeon: Laurence Spates, MD;  Location: [facility] ENDOSCOPY;  Service: Endoscopy;  Laterality: N/A;  . ESOPHAGOGASTRODUODENOSCOPY N/A 04/04/2017   Procedure: ESOPHAGOGASTRODUODENOSCOPY (EGD);  Surgeon:  Arta Silence, MD;  Location: [place];  Service: Endoscopy;  Laterality: N/A;  . ESOPHAGOGASTRODUODENOSCOPY (EGD) WITH PROPOFOL N/A 01/24/2016   Procedure: ESOPHAGOGASTRODUODENOSCOPY (EGD) WITH PROPOFOL;  Surgeon: Wonda Horner, MD;  Location: [facility] ENDOSCOPY;  Service: Endoscopy;  Laterality: N/A;  . ESOPHAGOGASTRODUODENOSCOPY (EGD) WITH PROPOFOL  N/A 09/18/2017   Procedure: ESOPHAGOGASTRODUODENOSCOPY (EGD) WITH PROPOFOL;  Surgeon: Wilford Corner, MD;  Location: [place];  Service: Endoscopy;  Laterality: N/A;  . GIVENS CAPSULE STUDY N/A 04/02/2017   Procedure: GIVENS CAPSULE STUDY;  Surgeon: Arta Silence, MD;  Location: [facility] ENDOSCOPY;  Service: Endoscopy;  Laterality: N/A;  . HERNIA REPAIR    . HOT HEMOSTASIS N/A 01/24/2016   Procedure: HOT HEMOSTASIS (ARGON PLASMA COAGULATION/BICAP);  Surgeon: Wonda Horner, MD;  Location: [facility] ENDOSCOPY;  Service: Endoscopy;  Laterality: N/A;  . IR ANGIOGRAM SELECTIVE EACH ADDITIONAL VESSEL  04/04/2017  . IR [place] ADDITIONAL VESSEL  04/04/2017  . IR EMBO ART  VEN HEMORR LYMPH EXTRAV  INC GUIDE ROADMAPPING  04/04/2017  . IR EMBO ART  VEN HEMORR LYMPH EXTRAV  INC GUIDE ROADMAPPING  04/04/2017  . IR RADIOLOGIST EVAL & MGMT  05/07/2017  . IR TIPS  04/04/2017  . IR US GUIDE VASC ACCESS RIGHT  04/04/2017  . IR VENOGRAM RENAL UNI RIGHT  04/04/2017  . RADIOLOGY WITH ANESTHESIA N/A 04/04/2017   Procedure: RADIOLOGY WITH ANESTHESIA;  Surgeon: Radiologist, Medication, MD;  Location: [place];  Service: Radiology;  Laterality: N/A;  . RIGHT/LEFT HEART CATH AND CORONARY ANGIOGRAPHY N/A 04/15/2017   Procedure: Right/Left Heart Cath and Coronary Angiography;  Surgeon: Martinique, Peter M, MD;  Location: [place] CV LAB;  Service: Cardiovascular;  Laterality: N/A;   Allergies Penicillins  Family History  Problem Relation Age of Onset  . Liver cancer Mother   . Diabetes Mellitus II Sister     Social History Social History   Tobacco Use  . Smoking status: Former Smoker    Packs/day: 2.00    Years: 40.00    Pack years: 80.00    Types: Cigarettes    Last attempt to quit: 08/14/2008    Years since quitting: 10.2  . Smokeless tobacco: Never Used  Substance Use Topics  . Alcohol use: Yes    Alcohol/week: 0.0 standard drinks    Comment: 11/12/18 quit 2016, 1 pint whiskey daily several beer weekly     01/2016  " no alcohol in 3 weeks "  . Drug use: No    Comment: quit 2016    Review of Systems  Constitutional: No fever/chills Eyes: No visual changes. ENT: No sore throat. Cardiovascular: Denies chest pain. Respiratory: Denies shortness of breath. Gastrointestinal: No abdominal pain.  No nausea, no vomiting.  No diarrhea.  No constipation. Genitourinary: Negative for dysuria. Musculoskeletal: Negative for back pain. Skin: Negative for rash. Neurological: Negative for headaches, focal weakness or numbness.  10-point ROS otherwise negative.  ____________________________________________   PHYSICAL EXAM:  VITAL SIGNS: ED Triage Vitals  Enc Vitals Group     BP 11/29/18 1047 123/76     Pulse Rate 11/29/18 1047 97     Resp 11/29/18 1047 16     Temp 11/29/18 1047 (!) 97.3 F (36.3 C)     Temp Source 11/29/18 1047 Axillary     SpO2 11/29/18 1047 100 %     Pain Score 11/29/18 1046 0   Constitutional: Drowsy but responds to commands and questions. No acute distress.  Eyes: Conjunctivae are normal.  Head: Atraumatic. Nose: No  congestion/rhinnorhea. Mouth/Throat: Mucous membranes are moist. Neck: No stridor.  Cardiovascular: Normal rate, regular rhythm. Good peripheral circulation. Grossly normal heart sounds.   Respiratory: Normal respiratory effort.  No retractions. Lungs CTAB. Gastrointestinal: Soft and nontender. No distention.  Musculoskeletal: No lower extremity tenderness. No gross deformities of extremities. Neurologic:  Normal speech and language. No gross focal neurologic deficits are appreciated.  Skin:  Skin is warm, dry and intact. No rash noted.  ____________________________________________   LABS (all labs ordered are listed, but only abnormal results are displayed)  Labs Reviewed  COMPREHENSIVE METABOLIC PANEL - Abnormal; Notable for the following components:      Result Value   Sodium 133 (*)    CO2 21 (*)    Glucose, Bld 288 (*)    Albumin 3.2 (*)     Total Bilirubin 1.3 (*)    All other components within normal limits  CBC WITH DIFFERENTIAL/PLATELET - Abnormal; Notable for the following components:   Monocytes Absolute 1.1 (*)    All other components within normal limits  AMMONIA - Abnormal; Notable for the following components:   Ammonia 119 (*)    All other components within normal limits  I-STAT CG4 LACTIC ACID, ED - Abnormal; Notable for the following components:   Lactic Acid, Venous 2.02 (*)    All other components within normal limits  CULTURE, BLOOD (ROUTINE X 2)  CULTURE, BLOOD (ROUTINE X 2)  PROTIME-INR  ETHANOL  URINALYSIS, ROUTINE W REFLEX MICROSCOPIC  I-STAT CG4 LACTIC ACID, ED   ____________________________________________  EKG   EKG Interpretation  Date/Time:  Saturday November 29 2018 10:51:57 EST Ventricular Rate:  97 PR Interval:    QRS Duration: 97 QT Interval:  386 QTC Calculation: 491 R Axis:   -27 Text Interpretation:  Sinus rhythm Biatrial enlargement Borderline left axis deviation Borderline prolonged QT interval No STEMI.  Confirmed by Nanda Quinton [phone]) on 11/29/2018 11:17:15 AM       ____________________________________________  RADIOLOGY  Dg Chest Port 1 View  Result Date: 11/29/2018 CLINICAL DATA:  67 year old male with history of altered mental status. EXAM: PORTABLE CHEST 1 VIEW COMPARISON:  Chest x-ray 04/25/2018. FINDINGS: Mild diffuse peribronchial cuffing and interstitial prominence, most evident throughout the mid to lower lungs bilaterally. Low lung volumes. No consolidative airspace disease. Emphysematous changes are again noted. No pleural effusions. No pneumothorax. No pulmonary nodule or mass noted. Pulmonary vasculature and the cardiomediastinal silhouette are within normal limits. Aortic atherosclerosis. IMPRESSION: 1. The appearance of the chest could suggest interstitial lung disease, as above. Alternatively, the possibility of acute bronchitis with developing multilobar  bronchopneumonia could be considered. Further evaluation with high-resolution chest CT would provide additional information. 2. Emphysema. 3. Aortic atherosclerosis. Electronically Signed   By: Vinnie Langton M.D.   On: 11/29/2018 11:38    ____________________________________________   PROCEDURES  Procedure(s) performed:   Procedures  None  ____________________________________________   INITIAL IMPRESSION / ASSESSMENT AND PLAN / ED COURSE  Pertinent labs & imaging results that were available during my care of the patient were reviewed by me and considered in my medical decision making (see chart for details).  Patient with history of hepatic encephalopathy presents with progressively worsening somnolence.  Afebrile here.  Normal blood pressures.  Patient is drowsy but responds to verbal commands and answers questions with brief responses.  Clinically I suspect this is hepatic encephalopathy but plan for infectious work-up as well including chest x-ray.  No focal neurological deficits to suspect stroke.  No CT imaging at  this time but will follow closely.  12:15 PM Patient significant other now at bedside.  She is able to provide additional history.  She states that she could tell he was getting slightly more confused over the past several days but yesterday was up and walking around.  He was eating normally.  He has been taking his lactulose as prescribed but has not had a bowel movement in the last 2 days.  This morning she had trouble getting him to stand and felt like his ammonia had risen again.  He has not been complaining of shortness of breath, cough, fevers.  Very low suspicion that the chest x-ray as described represents a multifocal pneumonia.  I suspect his mild lactic acidosis is related to dehydration rather than sepsis.  No leukocytosis or fever.  Patient's ammonia is elevated which seems to explain his encephalopathic presentation.  Will not order CT imaging of the head or  chest at this time.  Plan for IV fluids and lactulose here. Doubt sepsis.   Discussed patient's case with IM teaching service to request admission. Patient and family (if present) updated with plan. Care transferred to medicine service.  I reviewed all nursing notes, vitals, pertinent old records, EKGs, labs, imaging (as available).  ____________________________________________  FINAL CLINICAL IMPRESSION(S) / ED DIAGNOSES  Final diagnoses:  Hepatic encephalopathy ([place])     MEDICATIONS GIVEN DURING THIS VISIT:  Medications  lactated ringers infusion ( Intravenous New Bag/Given 11/29/18 1303)  sodium chloride 0.9 % bolus 500 mL (0 mLs Intravenous Stopped 11/29/18 1222)  lactulose (CHRONULAC) 10 GM/15ML solution 10 g (10 g Oral Given 11/29/18 1303)    Note:  This document was prepared using Dragon voice recognition software and may include unintentional dictation errors.  Nanda Quinton, MD Emergency Medicine    Dariona Postma, Wonda Olds, MD 11/29/18 [phone]

## 2018-11-29 NOTE — ED Notes (Signed)
Returned from ct scan 

## 2018-11-29 NOTE — H&P (Addendum)
Date: 11/29/2018               Patient Name:  John Warren MRN: [id number]  DOB: [birth year] Age / Sex: 67 y.o., male   PCP: Leonard Downing, MD         Medical Service: Internal Medicine Teaching Service         Attending Physician: Dr. Evette Doffing, Mallie Mussel, *    First Contact: Dr. Edsel Petrin Pager: [phone]  Second Contact: Dr. Frederico Hamman Pager: [phone]       After Hours (After 5p/  First Contact Pager: [phone]  weekends / holidays): Second Contact Pager: [phone]   Chief Complaint: AMS  History of Present Illness: John Warren is a 67 y/o gentleman with PMHx alcoholic cirrhosis s/p TIPS, hepatic encephalopathy, HFrEF, COPD presents for altered mental status. Patient was drowsy but altered on exam without any family at bedside, and significant other was not reachable by phone so history is mostly obtained per chart review.  Significant other noted he has progressively become more confused and sleepy over the last few days. He has been eating normally and taking lactulose as prescribed, but has not had a BM in the last 2 days.  He denies any pain anywhere or shortness of breath.   Meds:  Current Meds  Medication Sig  . acetaminophen (TYLENOL) 500 MG tablet Take 500-1,000 mg by mouth every 8 (eight) hours as needed (for pain).  Marland Kitchen aspirin EC 81 MG tablet Take 81 mg by mouth daily.  Marland Kitchen atorvastatin (LIPITOR) 20 MG tablet Take 1 tablet (20 mg total) by mouth daily.  . famotidine (PEPCID) 20 MG tablet Take 20 mg by mouth daily.   . ferrous sulfate 325 (65 FE) MG tablet Take 1 tablet (325 mg total) by mouth 3 (three) times daily with meals. (Patient taking differently: Take 325 mg by mouth 2 (two) times daily with a meal. )  . folic acid (FOLVITE) 1 MG tablet Take 1 tablet (1 mg total) by mouth daily.  . furosemide (LASIX) 80 MG tablet Take 80 mg by mouth daily.  Marland Kitchen gabapentin (NEURONTIN) 100 MG capsule Take 1 capsule (100 mg total) by mouth 3 (three) times daily.  Marland Kitchen lactulose  (CHRONULAC) 10 GM/15ML solution Take 45 mLs (30 g total) by mouth 3 (three) times daily.  . nitroGLYCERIN (NITROSTAT) 0.4 MG SL tablet Place 0.4 mg under the tongue every 5 (five) minutes as needed for chest pain.  . nortriptyline (PAMELOR) 10 MG capsule 10 mg at bedtime.  Marland Kitchen omeprazole (PRILOSEC) 40 MG capsule Take 40 mg by mouth daily.  . potassium chloride SA (K-DUR,KLOR-CON) 20 MEQ tablet Take 20 mEq by mouth every morning.   . rifaximin (XIFAXAN) 550 MG TABS tablet Take 1 tablet (550 mg total) by mouth 2 (two) times daily. (Patient taking differently: Take 550 mg by mouth daily. )  . spironolactone (ALDACTONE) 50 MG tablet Take 1 tablet (50 mg total) by mouth daily.  Marland Kitchen thiamine 100 MG tablet Take 1 tablet (100 mg total) by mouth daily. (Patient taking differently: Take 125 mg by mouth daily. )  . traMADol (ULTRAM) 50 MG tablet Take 50 mg by mouth every 6 (six) hours as needed (for pain).     Allergies: Allergies as of 11/29/2018 - Review Complete 11/29/2018  Allergen Reaction Noted  . Penicillins Rash 07/11/2015   Past Medical History:  Diagnosis Date  . Alcohol abuse   . Anemia   . Arthritis   . AVM (  arteriovenous malformation) of colon    a. By colonoscopy 07/2015.  Marland Kitchen Cirrhosis of liver ([place])   . Colon polyps   . COPD (chronic obstructive pulmonary disease) ([place])   . Diastolic CHF ([place])   . GERD (gastroesophageal reflux disease)   . GI bleed 01/2016  . Headache    cluster headaches  . History of blood transfusion   . History of hiatal hernia   . Hypertension   . Hypokalemia   . Internal hemorrhoids   . Portal hypertensive gastropathy ([place])    a. By EGD 07/2015.  . Sigmoid diverticulosis   . SVT (supraventricular tachycardia) ([place])    a. 07/2015 in setting of severe hypokalemia.  . Symptomatic anemia    a. 07/2015 - felt multifactorial from cirrhosis and AVMs.  . Thrombocytopenia ([place])   . Tobacco use     Family History:  Family History  Problem Relation Age of  Onset  . Liver cancer Mother   . Diabetes Mellitus II Sister      Social History: 40 pack year smoking history, quit 10 years ago; former heavy EtOH use but has abstained for the last 3 years per record review   Review of Systems: A complete ROS limited due to patient's AMS and no family present at time of admission   Physical Exam: Blood pressure 112/63, pulse 90, temperature (!) 97.3 F (36.3 C), temperature source Axillary, resp. rate 12, weight 88.1 kg, SpO2 98 %. General: drowsy but arousable to voice; no acute distress HEENT: [place]/AT; conjunctiva clear; moist mucous membranes Neck: supple; no thyromegaly CV: RRR; SEM heard best at RUSB Pulm: normal respiratory effort with scattered rhonchi that clear with cough  Abd: BS+; abdomen is soft, non-distended, non-tender; no fluid wave Neuro: A&Ox2; follows basic commands; moving all extremities  Ext: Bilateral lower extremities with chronic venous stasis changes; right medial lower extremity with increased warmth, erythema and induration   EKG: personally reviewed my interpretation is Normal sinus without evidence of acute ischemia  CXR: personally reviewed my interpretation is no evidence of effusion or pneumothorax; no focal consolidation but diffuse haziness throughout   Assessment & Plan by Problem: Active Problems:   Hepatic encephalopathy ([place])  1. Hepatic encephalopathy  - Grade II; likely precipitated by RLE cellulitis; will treat with Keflex and broaden based on further infectious work-up - blood cx and U/A pending - obtaining high resolution chest CT to rule out concurrent pneumonia given findings on CXR; he is afebrile and saturating well on room air, but does have some scattered rhonchi on exam and may be aspiration risk in the setting of AMS - treating with home Lactulose 30 mg TID and Rifaximin   2. RLE non-purulent cellulitis - U/S ruled out underlying abscess  - treat with Keflex 500 mg q 12   3. Alcoholic  cirrhosis - MELD score is 9; Child class B - LFTs normal  - no ascites on exam - EGD 1 year ago with grade 1 varices  - blood pressure stable  - continue home Spironolactone 50 mg - Protonix 80 mg daily   4. HFrEF - chronic, stable - no signs of acute exacerbation  - continue home Lasix 80 mg  - strict I&Os; daily weights   Diet: NPO  DVT ppx: heparin CODE: FULL   Dispo: Admit patient to Inpatient with expected length of stay greater than 2 midnights.  SignedDelice Bison, DO 11/29/2018, 4:44 PM  Pager: [phone]

## 2018-11-29 NOTE — ED Triage Notes (Signed)
Pt here from home with c/o aloc progressive since christmas , pt has history of cirrhosis

## 2018-11-30 DIAGNOSIS — L03115 Cellulitis of right lower limb: Secondary | ICD-10-CM | POA: Diagnosis present

## 2018-11-30 DIAGNOSIS — K7210 Chronic hepatic failure without coma: Secondary | ICD-10-CM | POA: Diagnosis present

## 2018-11-30 DIAGNOSIS — K7200 Acute and subacute hepatic failure without coma: Secondary | ICD-10-CM | POA: Diagnosis not present

## 2018-11-30 DIAGNOSIS — I872 Venous insufficiency (chronic) (peripheral): Secondary | ICD-10-CM | POA: Diagnosis not present

## 2018-11-30 DIAGNOSIS — I5022 Chronic systolic (congestive) heart failure: Secondary | ICD-10-CM | POA: Diagnosis present

## 2018-11-30 DIAGNOSIS — R4182 Altered mental status, unspecified: Secondary | ICD-10-CM | POA: Diagnosis not present

## 2018-11-30 DIAGNOSIS — K7030 Alcoholic cirrhosis of liver without ascites: Secondary | ICD-10-CM

## 2018-11-30 DIAGNOSIS — Z79899 Other long term (current) drug therapy: Secondary | ICD-10-CM

## 2018-11-30 LAB — GLUCOSE, CAPILLARY
GLUCOSE-CAPILLARY: 397 mg/dL — AB (ref 70–99)
Glucose-Capillary: 190 mg/dL — ABNORMAL HIGH (ref 70–99)
Glucose-Capillary: 242 mg/dL — ABNORMAL HIGH (ref 70–99)
Glucose-Capillary: 327 mg/dL — ABNORMAL HIGH (ref 70–99)
Glucose-Capillary: 324 mg/dL — ABNORMAL HIGH (ref 70–99)

## 2018-11-30 LAB — COMPREHENSIVE METABOLIC PANEL
ALT: 21 U/L (ref 0–44)
AST: 24 U/L (ref 15–41)
Albumin: 2.9 g/dL — ABNORMAL LOW (ref 3.5–5.0)
Alkaline Phosphatase: 81 U/L (ref 38–126)
Anion gap: 10 (ref 5–15)
BUN: 12 mg/dL (ref 8–23)
CO2: 20 mmol/L — ABNORMAL LOW (ref 22–32)
Calcium: 8.7 mg/dL — ABNORMAL LOW (ref 8.9–10.3)
Chloride: 105 mmol/L (ref 98–111)
Creatinine, Ser: 0.91 mg/dL (ref 0.61–1.24)
GFR calc Af Amer: >60 mL/min (ref >60)
GFR calc non Af Amer: >60 mL/min (ref >60)
Glucose, Bld: 197 mg/dL — ABNORMAL HIGH (ref 70–99)
Potassium: 4.1 mmol/L (ref 3.5–5.1)
Sodium: 135 mmol/L (ref 135–145)
Total Bilirubin: 1.6 mg/dL — ABNORMAL HIGH (ref 0.3–1.2)
Total Protein: 7 g/dL (ref 6.5–8.1)

## 2018-11-30 LAB — URINALYSIS, ROUTINE W REFLEX MICROSCOPIC
Bacteria, UA: NONE SEEN
Bilirubin Urine: NEGATIVE
Glucose, UA: >=500 mg/dL — AB
Hgb urine dipstick: NEGATIVE
KETONES UR: 5 mg/dL — AB
Leukocytes, UA: NEGATIVE
Nitrite: NEGATIVE
Protein, ur: NEGATIVE mg/dL
Specific Gravity, Urine: 1.024 (ref 1.005–1.030)
pH: 7 (ref 5.0–8.0)

## 2018-11-30 MED ORDER — LACTULOSE 10 GM/15ML PO SOLN
30.0000 g | ORAL | Status: DC
  Administered 2018-11-30 (×3): 30 g via ORAL
  Filled 2018-11-30 (×4): qty 45

## 2018-11-30 MED ORDER — LACTULOSE 10 GM/15ML PO SOLN
30.0000 g | Freq: Three times a day (TID) | ORAL | Status: DC
  Administered 2018-12-01 (×2): 30 g via ORAL
  Filled 2018-11-30 (×2): qty 45

## 2018-11-30 MED ORDER — LACTULOSE ENEMA
300.0000 mL | Freq: Once | ORAL | Status: DC
  Filled 2018-11-30: qty 300

## 2018-11-30 NOTE — Progress Notes (Signed)
   Subjective: [name] was seen and evaluated at beside on morning rounds with significant other at bedside. He was still drowsy but much more alert compared to yesterday. He denies any headaches, shortness of breath, chest pain, n/v, or abdominal pain. He has not had a bowel movement since Wednesday. His significant other notes he was acting normal Friday night, but was quite difficult to arouse by the following morning which is when she called EMS.  He has been compliant with Lactulose at home, but has noticed decreased frequency in his bowel movements since he started Rifaximin.   Objective:  Vital signs in last 24 hours: Vitals:   11/29/18 1730 11/29/18 2053 11/29/18 2053 11/30/18 0502  BP: 130/65  139/73 135/65  Pulse: 92  99 (!) 106  Resp: 13  18 18   Temp:   98.1 F (36.7 C) 98.7 F (37.1 C)  TempSrc:   Oral Oral  SpO2: 100%  99% 95%  Weight:  89.8 kg    Height:  6\' 3"  (1.905 m)     General: awake and more alert compared to yesterday, but still drowsy appearing and confused compared to baseline. Asterixis present Abd: BS+; abdomen is soft, non-distended, non-tender; no fluid wave  Ext: BLE with chronic venous stasis changes; right medial lower extremity with improved erythema and swelling since yesterday   Assessment/Plan:  Principal Problem:   Hepatic encephalopathy (HCC) Active Problems:   Alcoholic cirrhosis (HCC)   Chronic systolic heart failure (HCC)   Nonpurulent cellulitis of right lower extremity  1. Hepatic encephalopathy  - now Grade I; likely precipitated by combination of constipation and RLE cellulitis  - blood cx show no growth at 24 hrs and U/A is negative - his mental status has improved with abx, but he still has not had a bowel movement - will increase lactulose to 30 q 2 hrs with goal of 3 bowel movements/day  - PT eval   2. RLE non-purulent cellulitis - improved today on exam - continue Keflex 500 mg q 12  3. Alcoholic cirrhosis  - remains  hemodynamically stable - normal LFTs   - no ascites   4. HFrEF - chronic, stable - continue home Lasix 80 mg and Spironolactone 50 mg  - strict I&Os and daily weights   Dispo: Anticipated discharge in approximately 1-2 day(s).   Modena Nunnery D, DO 11/30/2018, 1:10 PM Pager: [phone]

## 2018-11-30 NOTE — Progress Notes (Signed)
Paged MD at approximately 1330 and confirmed to continue to give Lactulose every 2 hours until patient has 3 BM.   Confirmed with MD Donne Hazel at [facility] not to give ordered lactulose enema given patient's second large BM at approximately 1800.

## 2018-11-30 NOTE — Progress Notes (Signed)
Internal Medicine Teaching Service Attending:   I saw and examined the patient. I reviewed the resident's note and I agree with the resident's findings and plan as documented in the resident's note.  Principal Problem:   Hepatic encephalopathy (HCC) Active Problems:   Chronic liver failure (HCC)   Chronic systolic heart failure (HCC)   Nonpurulent cellulitis of right lower extremity  Hospital day #2 for this 67 year old man living with chronic liver failure admitted with acute hepatic encephalopathy.  He has shown some good improvement since initial presentation.  This morning his mental status is slow, a little drowsy, he has small asterixis of the left hand, most consistent with persistent grade 1 encephalopathy.  Cause of this decompensation is most likely due to both a nonpurulent cellulitis of his right lower extremity as well as inadequate bowel movements on lactulose.  We are going to escalate our dosing of lactulose to every 2 hours until he has bowel movements, goal of 3 bowel movements today.  Can use suppository if necessary.  Okay to hold rifaximin given that his wife thinks he is not tolerating it well.  We are continuing Keflex for his right lower extremity cellulitis, infectious work-up was otherwise reassuring.  Liver function is stable with meld score around 8.  PT and OT consultations pending.  May be ready for discharge in 1-2days pending improvement in his mental status.  Dr. Lynnae January will take over as attending physician tomorrow.  Lalla Brothers, MD FACP

## 2018-12-01 DIAGNOSIS — K7210 Chronic hepatic failure without coma: Secondary | ICD-10-CM | POA: Diagnosis not present

## 2018-12-01 DIAGNOSIS — R4182 Altered mental status, unspecified: Secondary | ICD-10-CM | POA: Diagnosis not present

## 2018-12-01 LAB — COMPREHENSIVE METABOLIC PANEL
ALBUMIN: 3 g/dL — AB (ref 3.5–5.0)
ALT: 20 U/L (ref 0–44)
ANION GAP: 11 (ref 5–15)
AST: 23 U/L (ref 15–41)
Alkaline Phosphatase: 81 U/L (ref 38–126)
BUN: 13 mg/dL (ref 8–23)
CO2: 20 mmol/L — ABNORMAL LOW (ref 22–32)
Calcium: 9 mg/dL (ref 8.9–10.3)
Chloride: 101 mmol/L (ref 98–111)
Creatinine, Ser: 0.91 mg/dL (ref 0.61–1.24)
GFR calc Af Amer: >60 mL/min (ref >60)
GFR calc non Af Amer: >60 mL/min (ref >60)
GLUCOSE: 236 mg/dL — AB (ref 70–99)
Potassium: 3.8 mmol/L (ref 3.5–5.1)
Sodium: 132 mmol/L — ABNORMAL LOW (ref 135–145)
Total Bilirubin: 1.6 mg/dL — ABNORMAL HIGH (ref 0.3–1.2)
Total Protein: 6.4 g/dL — ABNORMAL LOW (ref 6.5–8.1)

## 2018-12-01 LAB — GLUCOSE, CAPILLARY
Glucose-Capillary: 193 mg/dL — ABNORMAL HIGH (ref 70–99)
Glucose-Capillary: 311 mg/dL — ABNORMAL HIGH (ref 70–99)
Glucose-Capillary: 520 mg/dL (ref 70–99)
Glucose-Capillary: 327 mg/dL — ABNORMAL HIGH (ref 70–99)

## 2018-12-01 MED ORDER — METFORMIN HCL 500 MG PO TABS: 500.0000 mg | ORAL_TABLET | Freq: Every day | ORAL | 2 refills | Status: DC

## 2018-12-01 MED ORDER — CEPHALEXIN 500 MG PO CAPS: 500.0000 mg | ORAL_CAPSULE | Freq: Two times a day (BID) | ORAL | 0 refills | Status: AC

## 2018-12-01 MED ORDER — LACTULOSE ENEMA
300.0000 mL | Freq: Once | ORAL | Status: AC
  Administered 2018-12-01: 300 mL via RECTAL
  Filled 2018-12-01: qty 300

## 2018-12-01 MED ORDER — LACTULOSE 10 GM/15ML PO SOLN
30.0000 g | Freq: Once | ORAL | Status: AC
  Administered 2018-12-01: 30 g via ORAL
  Filled 2018-12-01: qty 45

## 2018-12-01 NOTE — Progress Notes (Signed)
Paged for patient stating he is ready to leave and "will be going home tonight." Discussed patient's case with day team. He is stable and having bowel movements, while not at his goal baseline of two per day, he is safe for discharge. No altered mental status and is accompanied by his wife at bedside. Discharge and strict return precautions discussed with patient and his wife, and they are in agreement.   Molli Hazard A, DO 12/01/2018, 9:50 PM Pager: [phone]

## 2018-12-01 NOTE — Evaluation (Signed)
Physical Therapy Evaluation Patient Details Name: John Warren MRN: [id number] DOB: November 13, 1951 Today's Date: 12/01/2018   History of Present Illness  67yo male presenting with AMS, admitted for hepatic encephalopathy and R LE cellulitis. PMH alcohol abuse, HTN, COPD, CHF, HTN, SVT, chronic liver failure   Clinical Impression   Patient received in bed, A&Ox4 and very pleasant, motivated to participate in therapy this morning; wife present and observed majority of session. Able to complete bed mobility with S and VC for management of catheter line, otherwise able to perform functional transfers with RW and Min guard, gait approximately 165f with RW and S. He was left up in the recliner with all needs met and family and visitor present, he politely declines OP PT but requests HHPT instead, educated on qualifications for [place] continues to request this service. He will continue to benefit from skilled PT services in the acute setting as well as skilled HHPT moving forward.     Follow Up Recommendations Home health PT;Other (comment)(declining OP PT )    Equipment Recommendations  Other (comment)(has all necessary DME )    Recommendations for Other Services       Precautions / Restrictions Precautions Precautions: None Restrictions Weight Bearing Restrictions: No      Mobility  Bed Mobility Overal bed mobility: Needs Assistance Bed Mobility: Supine to Sit     Supine to sit: Supervision     General bed mobility comments: for safety and management of catheter line   Transfers Overall transfer level: Needs assistance Equipment used: Rolling walker (2 wheeled) Transfers: Sit to/from Stand Sit to Stand: Min guard         General transfer comment: light min guard for safety, no unsteadiness or LOB noted, good hand placement   Ambulation/Gait Ambulation/Gait assistance: Supervision Gait Distance (Feet): 100 Feet Assistive device: Rolling walker (2 wheeled) Gait  Pattern/deviations: Step-through pattern;Trendelenburg;Trunk flexed Gait velocity: decreased    General Gait Details: reduced gait speed, appropriate use of AD, no significant unsteadiness or LOB noted during gait   Stairs            Wheelchair Mobility    Modified Rankin (Stroke Patients Only)       Balance Overall balance assessment: Mild deficits observed, not formally tested                                           Pertinent Vitals/Pain Pain Assessment: No/denies pain    Home Living Family/patient expects to be discharged to:: Private residence Living Arrangements: Spouse/significant other Available Help at Discharge: Family;Available 24 hours/day Type of Home: House Home Access: Stairs to enter Entrance Stairs-Rails: None Entrance Stairs-Number of Steps: 3 in front no railing but can hold to "post", 6 in back with railing Home Layout: One level Home Equipment: Walker - 2 wheels;Toilet riser Additional Comments: per him and wife, has multiple pieces of equipment at home (RW, 3 in 1, etc) but does not use any of it     Prior Function Level of Independence: Independent         Comments: Active outside, plays with grandson     Hand Dominance        Extremity/Trunk Assessment   Upper Extremity Assessment Upper Extremity Assessment: Overall WFL for tasks assessed    Lower Extremity Assessment Lower Extremity Assessment: Overall WFL for tasks assessed    Cervical / Trunk  Assessment Cervical / Trunk Assessment: Normal  Communication   Communication: No difficulties  Cognition Arousal/Alertness: Awake/alert Behavior During Therapy: WFL for tasks assessed/performed Overall Cognitive Status: Within Functional Limits for tasks assessed                                        General Comments      Exercises     Assessment/Plan    PT Assessment Patient needs continued PT services  PT Problem List Decreased  strength;Decreased knowledge of precautions;Decreased mobility;Decreased knowledge of use of DME;Decreased activity tolerance;Decreased coordination;Decreased safety awareness       PT Treatment Interventions DME instruction;Functional mobility training;Balance training;Patient/family education;Gait training;Therapeutic activities;Neuromuscular re-education;Stair training;Therapeutic exercise    PT Goals (Current goals can be found in the Care Plan section)  Acute Rehab PT Goals Patient Stated Goal: go home, keep being active  PT Goal Formulation: With patient/family Time For Goal Achievement: 12/15/18 Potential to Achieve Goals: Good    Frequency Min 3X/week   Barriers to discharge        Co-evaluation               AM-PAC PT "6 Clicks" Mobility  Outcome Measure Help needed turning from your back to your side while in a flat bed without using bedrails?: None Help needed moving from lying on your back to sitting on the side of a flat bed without using bedrails?: None Help needed moving to and from a bed to a chair (including a wheelchair)?: None Help needed standing up from a chair using your arms (e.g., wheelchair or bedside chair)?: A Little Help needed to walk in hospital room?: A Little Help needed climbing 3-5 steps with a railing? : A Little 6 Click Score: 21    End of Session Equipment Utilized During Treatment: Gait belt Activity Tolerance: Patient tolerated treatment well Patient left: in chair;with call bell/phone within reach;with family/visitor present   PT Visit Diagnosis: Muscle weakness (generalized) (M62.81);Other abnormalities of gait and mobility (R26.89)    Time: 9379-0240 PT Time Calculation (min) (ACUTE ONLY): 20 min   Charges:   PT Evaluation $PT Eval Moderate Complexity: 1 Mod          John Warren PT, DPT, CBIS  Supplemental Physical Therapist [place]    Pager [phone] Acute Rehab Office [phone]

## 2018-12-01 NOTE — Progress Notes (Signed)
   Subjective: John Warren was seen and evaluated at bedside on morning rounds. His significant other accompanied him at bedside. He continues to feel better, but says he still feels tired. He had three large bowel movements yesterday. He denies headaches, n/v, abdominal pain.   Objective:  Vital signs in last 24 hours: Vitals:   11/30/18 1338 11/30/18 2104 11/30/18 2104 12/01/18 0506  BP: 129/62 (!) 159/74 (!) 159/74 (!) 144/74  Pulse: (!) 106 (!) 111 (!) 110 (!) 102  Resp: 17 18 18    Temp: 99.3 F (37.4 C) 98.2 F (36.8 C) 98.2 F (36.8 C) 98.6 F (37 C)  TempSrc: Oral Oral Oral Oral  SpO2: 98% 98% 98% 98%  Weight:      Height:       General: awake, A&Ox3; basic calculation intact which is improved from yesterday; conversing normal; no asterixis.  Abd: BS+; abdomen is soft, non-distended, non-tender; no fluid wave Ext: BLE with chronic venous stasis changes; right medial lower extremity with continued improvement in erythema and swelling   Assessment/Plan:  Principal Problem:   Hepatic encephalopathy (HCC) Active Problems:   Chronic liver failure (HCC)   Chronic systolic heart failure (HCC)   Nonpurulent cellulitis of right lower extremity  1. Hepatic encephalopathy  - resolved; likely precipitated by insufficient bowel movements at home and RLE cellulitis  - other infectious work-up negative - had three bowel movements yesterday; have adjusted Lactulose back to TID  - if he continues to have bowel movements on home lactulose dosing he can likely go home today  - PT eval recommending home health services which we will arrange   2. RLE non-purulent cellulitis - continues to improve clinically  - treating with Keflex 500 mg q 12 for a total of 5 days   3. Alcoholic cirrhosis  - remains hemodynamically stable - normal LFTs   - no ascites on exam   4. HFrEF - chronic, stable - continue home Lasix 80 mg and Spironolactone 50 mg  - strict I&Os and daily weights   5.  DM type II  - A1C on 11/12/18  - required SSI to control blood sugars while inpatient  - will start Metformin at discharge  Dispo: Anticipated discharge in approximately 0-1 day(s).   Modena Nunnery D, DO 12/01/2018, 10:21 AM Pager: [phone]

## 2018-12-01 NOTE — Progress Notes (Signed)
pts blood sugar 520 pt is asymptomatic, Dr Alfonse Spruce notified verbal order to give 10 Units and recheck blood sugar in 2 hrs.

## 2018-12-01 NOTE — Discharge Summary (Signed)
Name: John Warren MRN: [id number] DOB: 02/07/1951 67 y.o. PCP: Leonard Downing, MD  Date of Admission: 11/29/2018 10:42 AM Date of Discharge: 12/01/2018 Attending Physician: Bartholomew Crews, MD   Discharge Diagnosis: 1. Hepatic encephalopathy  2. Chronic liver failure 3. Chronic systolic heart failure 4. Nonpurulent cellulitis of right lower extremity  5. Type II DM   Discharge Medications: Allergies as of 12/01/2018      Reactions   Penicillins Rash   Has patient had a PCN reaction causing immediate rash, facial/tongue/throat swelling, SOB or lightheadedness with hypotension: Yes Has patient had a PCN reaction causing severe rash involving mucus membranes or skin necrosis: No Has patient had a PCN reaction that required hospitalization: No Has patient had a PCN reaction occurring within the last 10 years: No If all of the above answers are "NO", then may proceed with Cephalosporin use. Had CTX before      Medication List    STOP taking these medications   rifaximin 550 MG Tabs tablet Commonly known as:  XIFAXAN     TAKE these medications   acetaminophen 500 MG tablet Commonly known as:  TYLENOL Take 500-1,000 mg by mouth every 8 (eight) hours as needed (for pain).   aspirin EC 81 MG tablet Take 81 mg by mouth daily.   atorvastatin 20 MG tablet Commonly known as:  LIPITOR Take 1 tablet (20 mg total) by mouth daily.   famotidine 20 MG tablet Commonly known as:  PEPCID Take 20 mg by mouth daily.   ferrous sulfate 325 (65 FE) MG tablet Take 1 tablet (325 mg total) by mouth 3 (three) times daily with meals. What changed:  when to take this   folic acid 1 MG tablet Commonly known as:  FOLVITE Take 1 tablet (1 mg total) by mouth daily.   furosemide 80 MG tablet Commonly known as:  LASIX Take 80 mg by mouth daily.   gabapentin 100 MG capsule Commonly known as:  NEURONTIN Take 1 capsule (100 mg total) by mouth 3 (three) times daily.     lactulose 10 GM/15ML solution Commonly known as:  CHRONULAC Take 45 mLs (30 g total) by mouth 3 (three) times daily.   nitroGLYCERIN 0.4 MG SL tablet Commonly known as:  NITROSTAT Place 0.4 mg under the tongue every 5 (five) minutes as needed for chest pain.   nortriptyline 10 MG capsule Commonly known as:  PAMELOR 10 mg at bedtime.   omeprazole 40 MG capsule Commonly known as:  PRILOSEC Take 40 mg by mouth daily.   potassium chloride SA 20 MEQ tablet Commonly known as:  K-DUR,KLOR-CON Take 20 mEq by mouth every morning.   spironolactone 50 MG tablet Commonly known as:  ALDACTONE Take 1 tablet (50 mg total) by mouth daily.   thiamine 100 MG tablet Take 1 tablet (100 mg total) by mouth daily. What changed:  how much to take   traMADol 50 MG tablet Commonly known as:  ULTRAM Take 50 mg by mouth every 6 (six) hours as needed (for pain).     ASK your doctor about these medications   cephALEXin 500 MG capsule Commonly known as:  KEFLEX Take 1 capsule (500 mg total) by mouth every 12 (twelve) hours for 3 days. Ask about: Should I take this medication?       Disposition and follow-up:   JohnJohn Warren was discharged from [facility] in Stable condition.  At the hospital follow up visit please address:  1.  Hepatic encephalopathy: precipitated by insufficient bowel movements and RLE cellulitis. Rifaximin was discontinued; patient and his wife insisted his constipation began after starting this medication. Please ensure he is having adequate bowel movements at home and adjust lactulose dosing as needed.  John Warren was adamant about leaving prior to advised discharge; ideally we would have liked him to stay until he was having 3 bowel movements/day, but he left after having a bowel movement late afternoon on day of discharge.  - RLE non-purulent cellulitis: treated with  Keflex 500 mg BID for total of 5 days.  - Type II DM: A1C of 7 on 12/11. His CBGs were  difficult to control inpatient due to increased lactulose. Will need to start treatment for DM with PCP.   2.  Labs / imaging needed at time of follow-up: CMP, CBC  3.  Pending labs/ test needing follow-up: none   Follow-up Appointments: Follow-up Information    Leonard Downing, MD. Schedule an appointment as soon as possible for a visit in 1 week(s).   Specialty:  Family Medicine Contact information: [address] [place] [address] [phone]           Hospital Course by problem list: John Warren is a 67 y/o gentleman with PMHx alcoholic cirrhosis s/p TIPS, hepatic encephalopathy, HFrEF, COPD presents for altered mental status due to grade II encephalopathy precipitated by inadequate bowel movements at home, in addition to a right lower extremity cellulitis   1. Hepatic encephalopathy: improved over the next 2 days after receiving antibiotics and increased doses of lactulose. His Rifaximin was discontinued, as he endorsed decreased bowel movements for the last few weeks since starting the medication. He was discharged on his home dosing of Lactulose and was advised his goal should be three loose bowel movements/day. Unfortunately, he left prior to ensuring his home dose was adequate for this goal. Will need close outpatient monitoring and titrating Lactulose if indicated, as he it at increased risk for re-admission if he is not able to maintain adequate bowel movements   2. Right lower extremity non-purulent cellulitis: He had a soft tissue ultrasound done to rule out underlying abscess. He was started on Keflex 500 mg BID for a 5 days course. Clinically improved throughout admission.   3. Alcoholic cirrhosis s/p TIPS: patient remained hemodynamically stable throughout admission with normal LFTs. He did not have any ascites.   4. HFrEF: John Warren did not exhibit signs or symptoms of acute exacerbation. He was continued on his home Lasix 80 mg and Spironolactone 50 mg.    5. Type II DM: A1C on 11/12/18 was 7. He required SSI to control his blood sugars while inpatient. The increased lactulose dosing likely increased his requirements . Will need to be started on therapy by PCP. Considered starting on Metformin. However, it is contraindicated in patients with liver disease.   6. Pulmonary nodule: Mr. Jungwirth had a high resolution chest CT due to findings on CXR to ensure he did not have acute pneumonia contributing to his precipitated hepatic encephalopathy. CT showed the following IMPRESSION: 1. No findings to suggest interstitial lung disease. 2. Diffuse bronchial wall thickening with moderate centrilobular and paraseptal emphysema; imaging findings compatible with the reported clinical history of COPD. 3. 6 mm subpleural nodule in the posterior aspect of the left upper lobe abutting the major fissure, stable compared to prior study from 01/03/2018. This is nonspecific but statistically likely a benign subpleural lymph node. Future chest CT in February  2021 is recommended ability to ensure continued stability.  Discharge Vitals:   BP (!) 149/71 (BP Location: Left Arm)   Pulse (!) 106   Temp 98.5 F (36.9 C) (Oral)   Resp 18   Ht 6\' 3"  (1.905 m)   Wt 89.8 kg   SpO2 98%   BMI 24.75 kg/m   Pertinent Labs, Studies, and Procedures:  CMP Latest Ref Rng & Units 12/01/2018 11/30/2018 11/29/2018  Glucose 70 - 99 mg/dL 236(H) 197(H) 288(H)  BUN 8 - 23 mg/dL 13 12 16   Creatinine 0.61 - 1.24 mg/dL 0.91 0.91 1.15  Sodium 135 - 145 mmol/L 132(L) 135 133(L)  Potassium 3.5 - 5.1 mmol/L 3.8 4.1 4.4  Chloride 98 - 111 mmol/L 101 105 102  CO2 22 - 32 mmol/L 20(L) 20(L) 21(L)  Calcium 8.9 - 10.3 mg/dL 9.0 8.7(L) 9.2  Total Protein 6.5 - 8.1 g/dL 6.4(L) 7.0 6.6  Total Bilirubin 0.3 - 1.2 mg/dL 1.6(H) 1.6(H) 1.3(H)  Alkaline Phos 38 - 126 U/L 81 81 84  AST 15 - 41 U/L 23 24 27   ALT 0 - 44 U/L 20 21 23     Discharge Instructions: Discharge Instructions    (HEART  FAILURE PATIENTS) Call MD:  Anytime you have any of the following symptoms: 1) 3 pound weight gain in 24 hours or 5 pounds in 1 week 2) shortness of breath, with or without a dry hacking cough 3) swelling in the hands, feet or stomach 4) if you have to sleep on extra pillows at night in order to breathe.   Complete by:  As directed    Call MD for:  difficulty breathing, headache or visual disturbances   Complete by:  As directed    Call MD for:  extreme fatigue   Complete by:  As directed    Call MD for:  persistant dizziness or light-headedness   Complete by:  As directed    Call MD for:  persistant nausea and vomiting   Complete by:  As directed    Call MD for:  severe uncontrolled pain   Complete by:  As directed    Call MD for:  temperature >100.4   Complete by:  As directed    Diet - low sodium heart healthy   Complete by:  As directed    Discharge instructions   Complete by:  As directed    Mr. Christoffersen, it was a pleasure taking care of you. You were treated for hepatic encephalopathy that was caused from not having enough bowel movements at home, as well as an infection in your right leg.  We have stopped the Xifaxan. Continue taking your lactulose three times daily. Your goal is to have 3 bowel movements/day. Please call your doctor if you notice you are not having them this frequently.  For your leg infection, you will continue the antibiotics for an additional 3 days.  We have started you on a medication called Metformin that you will take every day with breakfast to help control your blood sugars.  Please make an appointment to follow-up with your primary care doctor within the next week.  Take care! Dr. Koleen Distance   Increase activity slowly   Complete by:  As directed       Signed: Delice Bison, DO 12/09/2018, 9:37 PM   Pager: [phone]

## 2018-12-01 NOTE — Care Management Note (Signed)
Case Management Note  Patient Details  Name: IZAIAS KRUPKA MRN: [id number] Date of Birth: September 08, 1951  Subjective/Objective:     Pt admitted with hepatic encephalopathy. He is from home with spouse.                Action/Plan: Pt has orders for [facility] services. CM provided choice. Pt and wife are refusing [facility] services. Pt also with orders for a walker. Both he and wife are refusing this also. They state he has DME at home.  Wife is able to provide supervision at home and transportation to home.  Expected Discharge Date:                  Expected Discharge Plan:  [place]  In-House Referral:     Discharge planning Services  CM Consult  Post Acute Care Choice:  Durable Medical Equipment Choice offered to:     DME Arranged:    DME Agency:     HH Arranged:  Patient Refused [place] Agency:     Status of Service:  Completed, signed off  If discussed at [place] of Stay Meetings, dates discussed:    Additional Comments:  Pollie Friar, RN 12/01/2018, 3:47 PM

## 2018-12-01 NOTE — Progress Notes (Signed)
Pt discharged to home with wife. Pt alert and oriented X4 with no c/o of pain or discomfort. Gave d/c summary to pt and wife after going over new medication and follow up appt. Pt and wife stated understood all instruction and direction.

## 2018-12-04 LAB — CULTURE, BLOOD (ROUTINE X 2)
Culture: NO GROWTH
Culture: NO GROWTH
Special Requests: ADEQUATE

## 2018-12-26 ENCOUNTER — Ambulatory Visit (HOSPITAL_BASED_OUTPATIENT_CLINIC_OR_DEPARTMENT_OTHER): Payer: Medicare Other

## 2018-12-26 ENCOUNTER — Other Ambulatory Visit: Payer: Self-pay

## 2018-12-26 ENCOUNTER — Encounter (HOSPITAL_COMMUNITY): Payer: Self-pay

## 2018-12-26 ENCOUNTER — Observation Stay (HOSPITAL_COMMUNITY)
Admission: EM | Admit: 2018-12-26 | Discharge: 2018-12-27 | Disposition: A | Discharge status: Home / Self Care | Payer: Medicare Other | Attending: Internal Medicine | Admitting: Internal Medicine

## 2018-12-26 ENCOUNTER — Emergency Department (HOSPITAL_COMMUNITY): Payer: Medicare Other

## 2018-12-26 DIAGNOSIS — I252 Old myocardial infarction: Secondary | ICD-10-CM | POA: Diagnosis not present

## 2018-12-26 DIAGNOSIS — R072 Precordial pain: Secondary | ICD-10-CM | POA: Diagnosis not present

## 2018-12-26 DIAGNOSIS — I110 Hypertensive heart disease with heart failure: Secondary | ICD-10-CM | POA: Diagnosis not present

## 2018-12-26 DIAGNOSIS — J42 Unspecified chronic bronchitis: Secondary | ICD-10-CM

## 2018-12-26 DIAGNOSIS — K219 Gastro-esophageal reflux disease without esophagitis: Secondary | ICD-10-CM | POA: Diagnosis not present

## 2018-12-26 DIAGNOSIS — R079 Chest pain, unspecified: Secondary | ICD-10-CM | POA: Diagnosis present

## 2018-12-26 DIAGNOSIS — R188 Other ascites: Secondary | ICD-10-CM | POA: Diagnosis present

## 2018-12-26 DIAGNOSIS — I080 Rheumatic disorders of both mitral and aortic valves: Secondary | ICD-10-CM | POA: Insufficient documentation

## 2018-12-26 DIAGNOSIS — Z79899 Other long term (current) drug therapy: Secondary | ICD-10-CM | POA: Insufficient documentation

## 2018-12-26 DIAGNOSIS — Z87891 Personal history of nicotine dependence: Secondary | ICD-10-CM | POA: Insufficient documentation

## 2018-12-26 DIAGNOSIS — K7031 Alcoholic cirrhosis of liver with ascites: Secondary | ICD-10-CM | POA: Insufficient documentation

## 2018-12-26 DIAGNOSIS — I491 Atrial premature depolarization: Secondary | ICD-10-CM | POA: Insufficient documentation

## 2018-12-26 DIAGNOSIS — Z7982 Long term (current) use of aspirin: Secondary | ICD-10-CM | POA: Diagnosis not present

## 2018-12-26 DIAGNOSIS — I350 Nonrheumatic aortic (valve) stenosis: Secondary | ICD-10-CM

## 2018-12-26 DIAGNOSIS — J449 Chronic obstructive pulmonary disease, unspecified: Secondary | ICD-10-CM | POA: Diagnosis not present

## 2018-12-26 DIAGNOSIS — D500 Iron deficiency anemia secondary to blood loss (chronic): Secondary | ICD-10-CM | POA: Diagnosis present

## 2018-12-26 DIAGNOSIS — K7210 Chronic hepatic failure without coma: Secondary | ICD-10-CM | POA: Diagnosis not present

## 2018-12-26 DIAGNOSIS — K7290 Hepatic failure, unspecified without coma: Secondary | ICD-10-CM | POA: Insufficient documentation

## 2018-12-26 DIAGNOSIS — I2510 Atherosclerotic heart disease of native coronary artery without angina pectoris: Secondary | ICD-10-CM | POA: Diagnosis present

## 2018-12-26 DIAGNOSIS — I25119 Atherosclerotic heart disease of native coronary artery with unspecified angina pectoris: Secondary | ICD-10-CM | POA: Insufficient documentation

## 2018-12-26 DIAGNOSIS — Z880 Allergy status to penicillin: Secondary | ICD-10-CM | POA: Insufficient documentation

## 2018-12-26 DIAGNOSIS — D696 Thrombocytopenia, unspecified: Secondary | ICD-10-CM | POA: Insufficient documentation

## 2018-12-26 DIAGNOSIS — K7460 Unspecified cirrhosis of liver: Secondary | ICD-10-CM | POA: Diagnosis present

## 2018-12-26 DIAGNOSIS — I25118 Atherosclerotic heart disease of native coronary artery with other forms of angina pectoris: Secondary | ICD-10-CM

## 2018-12-26 DIAGNOSIS — I209 Angina pectoris, unspecified: Secondary | ICD-10-CM

## 2018-12-26 DIAGNOSIS — I5042 Chronic combined systolic (congestive) and diastolic (congestive) heart failure: Secondary | ICD-10-CM | POA: Insufficient documentation

## 2018-12-26 LAB — CBC
HCT: 32.7 % — ABNORMAL LOW (ref 39.0–52.0)
Hemoglobin: 10.6 g/dL — ABNORMAL LOW (ref 13.0–17.0)
MCH: 32.4 pg (ref 26.0–34.0)
MCHC: 32.4 g/dL (ref 30.0–36.0)
MCV: 100 fL (ref 80.0–100.0)
PLATELETS: 149 10*3/uL — AB (ref 150–400)
RBC: 3.27 MIL/uL — ABNORMAL LOW (ref 4.22–5.81)
RDW: 14.1 % (ref 11.5–15.5)
WBC: 8.2 10*3/uL (ref 4.0–10.5)
nRBC: 0 % (ref 0.0–0.2)

## 2018-12-26 LAB — BASIC METABOLIC PANEL
ANION GAP: 10 (ref 5–15)
BUN: 14 mg/dL (ref 8–23)
CO2: 19 mmol/L — ABNORMAL LOW (ref 22–32)
Calcium: 9.2 mg/dL (ref 8.9–10.3)
Chloride: 105 mmol/L (ref 98–111)
Creatinine, Ser: 1.12 mg/dL (ref 0.61–1.24)
GFR calc Af Amer: >60 mL/min (ref >60)
GLUCOSE: 208 mg/dL — AB (ref 70–99)
Potassium: 4.9 mmol/L (ref 3.5–5.1)
Sodium: 134 mmol/L — ABNORMAL LOW (ref 135–145)

## 2018-12-26 LAB — I-STAT TROPONIN, ED
Troponin i, poc: 0.02 ng/mL (ref 0.00–0.08)
Troponin i, poc: 0.02 ng/mL (ref 0.00–0.08)

## 2018-12-26 LAB — ECHOCARDIOGRAM COMPLETE
Height: 72 in
Weight: 3360 oz

## 2018-12-26 LAB — TROPONIN I
Troponin I: 0.04 ng/mL (ref <0.03)
Troponin I: 0.04 ng/mL (ref <0.03)
Troponin I: 0.04 ng/mL (ref <0.03)
Troponin I: 0.04 ng/mL (ref <0.03)

## 2018-12-26 MED ORDER — SODIUM CHLORIDE 0.9% FLUSH
3.0000 mL | Freq: Once | INTRAVENOUS | Status: AC
  Administered 2018-12-26: 3 mL via INTRAVENOUS

## 2018-12-26 MED ORDER — FOLIC ACID 1 MG PO TABS
1.0000 mg | ORAL_TABLET | Freq: Every day | ORAL | Status: DC
  Administered 2018-12-26 – 2018-12-27 (×2): 1 mg via ORAL
  Filled 2018-12-26 (×2): qty 1

## 2018-12-26 MED ORDER — ATORVASTATIN CALCIUM 10 MG PO TABS
20.0000 mg | ORAL_TABLET | Freq: Every day | ORAL | Status: DC
  Administered 2018-12-26 – 2018-12-27 (×2): 20 mg via ORAL
  Filled 2018-12-26 (×2): qty 2

## 2018-12-26 MED ORDER — ACETAMINOPHEN 325 MG PO TABS: 650.0000 mg | ORAL_TABLET | ORAL | Status: DC | PRN

## 2018-12-26 MED ORDER — NITROGLYCERIN 0.4 MG SL SUBL
0.4000 mg | SUBLINGUAL_TABLET | SUBLINGUAL | Status: DC | PRN
  Administered 2018-12-27: 0.4 mg via SUBLINGUAL
  Filled 2018-12-26: qty 1

## 2018-12-26 MED ORDER — FAMOTIDINE 20 MG PO TABS
20.0000 mg | ORAL_TABLET | Freq: Every day | ORAL | Status: DC
  Administered 2018-12-26 – 2018-12-27 (×2): 20 mg via ORAL
  Filled 2018-12-26 (×2): qty 1

## 2018-12-26 MED ORDER — ASPIRIN EC 81 MG PO TBEC
81.0000 mg | DELAYED_RELEASE_TABLET | Freq: Every day | ORAL | Status: DC
  Administered 2018-12-26 – 2018-12-27 (×2): 81 mg via ORAL
  Filled 2018-12-26 (×2): qty 1

## 2018-12-26 MED ORDER — FERROUS SULFATE 325 (65 FE) MG PO TABS
325.0000 mg | ORAL_TABLET | Freq: Two times a day (BID) | ORAL | Status: DC
  Administered 2018-12-26 – 2018-12-27 (×4): 325 mg via ORAL
  Filled 2018-12-26 (×4): qty 1

## 2018-12-26 MED ORDER — FUROSEMIDE 80 MG PO TABS
80.0000 mg | ORAL_TABLET | Freq: Every day | ORAL | Status: DC
  Administered 2018-12-26 – 2018-12-27 (×2): 80 mg via ORAL
  Filled 2018-12-26 (×2): qty 1

## 2018-12-26 MED ORDER — GABAPENTIN 100 MG PO CAPS
100.0000 mg | ORAL_CAPSULE | Freq: Every day | ORAL | Status: DC
  Administered 2018-12-26: 100 mg via ORAL
  Filled 2018-12-26: qty 1

## 2018-12-26 MED ORDER — LACTULOSE 10 GM/15ML PO SOLN
30.0000 g | Freq: Three times a day (TID) | ORAL | Status: DC
  Administered 2018-12-26 – 2018-12-27 (×5): 30 g via ORAL
  Filled 2018-12-26 (×5): qty 45

## 2018-12-26 MED ORDER — PANTOPRAZOLE SODIUM 40 MG PO TBEC
40.0000 mg | DELAYED_RELEASE_TABLET | Freq: Every day | ORAL | Status: DC
  Administered 2018-12-26 – 2018-12-27 (×2): 40 mg via ORAL
  Filled 2018-12-26 (×2): qty 1

## 2018-12-26 MED ORDER — MORPHINE SULFATE (PF) 2 MG/ML IV SOLN
2.0000 mg | INTRAVENOUS | Status: DC | PRN
  Administered 2018-12-26: 2 mg via INTRAVENOUS
  Filled 2018-12-26: qty 1

## 2018-12-26 MED ORDER — NORTRIPTYLINE HCL 10 MG PO CAPS
10.0000 mg | ORAL_CAPSULE | Freq: Every day | ORAL | Status: DC
  Administered 2018-12-26 – 2018-12-27 (×2): 10 mg via ORAL
  Filled 2018-12-26 (×2): qty 1

## 2018-12-26 MED ORDER — HYOSCYAMINE SULFATE 0.125 MG SL SUBL
0.2500 mg | SUBLINGUAL_TABLET | Freq: Once | SUBLINGUAL | Status: AC
  Administered 2018-12-26: 0.25 mg via SUBLINGUAL
  Filled 2018-12-26: qty 2

## 2018-12-26 MED ORDER — ONDANSETRON HCL 4 MG/2ML IJ SOLN: 4.0000 mg | Freq: Four times a day (QID) | INTRAMUSCULAR | Status: DC | PRN

## 2018-12-26 NOTE — ED Notes (Signed)
Trop 0.04 called to this RN by lab, Pt has already gone to [facility], Rod Holler RN notified and MD paged.

## 2018-12-26 NOTE — ED Triage Notes (Signed)
Pt states that over the past three hours he has been having L sided CP with radiation to L arm, SOB, no nausea, denies dizziness or diaphoresis. Pt took two nitro with relief that are probably expired.

## 2018-12-26 NOTE — Progress Notes (Signed)
RN unable to give 1100 hyoscyamine because pt is not available. Oncoming RN aware.

## 2018-12-26 NOTE — ED Provider Notes (Signed)
[facility] EMERGENCY DEPARTMENT Provider Note   CSN: [id number] Arrival date & time: 12/26/18  0316     History   Chief Complaint Chief Complaint  Patient presents with  . Chest Pain    HPI CODEY BURLING is a 68 y.o. male.  HPI 68 year old male with history of alcoholic liver cirrhosis, diastolic CHF, coronary artery disease that is being medically managed comes in with chief complaint of chest pain.  Patient reports that he woke up in the middle the night around 1:00 and then again at 2 AM with chest pain.  His chest pain was left-sided.  The second occasion had his chest pain radiating down his left upper extremity.  On both occasions he took nitroglycerin and got complete pain resolution.  Patient also states that yesterday he had exertional shortness of breath that was worse than usual.  At the moment patient is chest pain-free.  He denies any associated nausea, shortness of breath or diaphoresis.  Past Medical History:  Diagnosis Date  . Alcohol abuse   . Anemia   . Arthritis   . AVM (arteriovenous malformation) of colon    a. By colonoscopy 07/2015.  Marland Kitchen Cirrhosis of liver ([place])   . Colon polyps   . COPD (chronic obstructive pulmonary disease) ([place])   . Diastolic CHF ([place])   . GERD (gastroesophageal reflux disease)   . GI bleed 01/2016  . Headache    cluster headaches  . History of blood transfusion   . History of hiatal hernia   . Hypertension   . Hypokalemia   . Internal hemorrhoids   . Portal hypertensive gastropathy ([place])    a. By EGD 07/2015.  . Sigmoid diverticulosis   . SVT (supraventricular tachycardia) ([place])    a. 07/2015 in setting of severe hypokalemia.  . Symptomatic anemia    a. 07/2015 - felt multifactorial from cirrhosis and AVMs.  . Thrombocytopenia ([place])   . Tobacco use     Patient Active Problem List   Diagnosis Date Noted  . Chronic liver failure ([place]) 11/30/2018  . Chronic systolic heart failure ([place]) 11/30/2018  .  Nonpurulent cellulitis of right lower extremity 11/30/2018  . Aortic stenosis 11/14/2018  . Dyslipidemia 11/14/2018  . Hepatic encephalopathy ([place]) 01/03/2018  . Hypervolemia   . Coronary artery disease   . NSVT (nonsustained ventricular tachycardia) ([place])   . Acute combined systolic and diastolic heart failure ([place]) 04/07/2017  . Non-ST elevation (NSTEMI) myocardial infarction ([place]) 04/07/2017  . Cirrhosis of liver with ascites ([place])   . Varices, esophageal ([place])   . Acute upper GI bleeding 12/29/2015  . AVM (arteriovenous malformation) of colon   . Internal hemorrhoids   . Sigmoid diverticulosis   . Alcohol use disorder   . Tobacco use disorder   . Thrombocytopenia ([place])   . COPD (chronic obstructive pulmonary disease) ([place]) 07/13/2015  . Iron deficiency anemia due to chronic blood loss 07/11/2015    Past Surgical History:  Procedure Laterality Date  . CATARACT EXTRACTION    . COLONOSCOPY N/A 07/15/2015   Procedure: COLONOSCOPY;  Surgeon: Wilford Corner, MD;  Location: [facility] ENDOSCOPY;  Service: Endoscopy;  Laterality: N/A;  . COLONOSCOPY WITH PROPOFOL N/A 04/04/2017   Procedure: COLONOSCOPY WITH PROPOFOL;  Surgeon: Ronnette Juniper, MD;  Location: [facility];  Service: Gastroenterology;  Laterality: N/A;  . ESOPHAGOGASTRODUODENOSCOPY N/A 07/15/2015   Procedure: ESOPHAGOGASTRODUODENOSCOPY (EGD);  Surgeon: Wilford Corner, MD;  Location: [facility] ENDOSCOPY;  Service: Endoscopy;  Laterality: N/A;  . ESOPHAGOGASTRODUODENOSCOPY  N/A 12/30/2015   Procedure: ESOPHAGOGASTRODUODENOSCOPY (EGD);  Surgeon: Ronald Lobo, MD;  Location: [facility] ENDOSCOPY;  Service: Endoscopy;  Laterality: N/A;  . ESOPHAGOGASTRODUODENOSCOPY N/A 03/30/2017   Procedure: ESOPHAGOGASTRODUODENOSCOPY (EGD);  Surgeon: Laurence Spates, MD;  Location: [facility] ENDOSCOPY;  Service: Endoscopy;  Laterality: N/A;  . ESOPHAGOGASTRODUODENOSCOPY N/A 04/04/2017   Procedure: ESOPHAGOGASTRODUODENOSCOPY (EGD);  Surgeon: Arta Silence, MD;  Location: [facility]  ENDOSCOPY;  Service: Endoscopy;  Laterality: N/A;  . ESOPHAGOGASTRODUODENOSCOPY (EGD) WITH PROPOFOL N/A 01/24/2016   Procedure: ESOPHAGOGASTRODUODENOSCOPY (EGD) WITH PROPOFOL;  Surgeon: Wonda Horner, MD;  Location: [facility] ENDOSCOPY;  Service: Endoscopy;  Laterality: N/A;  . ESOPHAGOGASTRODUODENOSCOPY (EGD) WITH PROPOFOL N/A 09/18/2017   Procedure: ESOPHAGOGASTRODUODENOSCOPY (EGD) WITH PROPOFOL;  Surgeon: Wilford Corner, MD;  Location: [facility];  Service: Endoscopy;  Laterality: N/A;  . GIVENS CAPSULE STUDY N/A 04/02/2017   Procedure: GIVENS CAPSULE STUDY;  Surgeon: Arta Silence, MD;  Location: [facility] ENDOSCOPY;  Service: Endoscopy;  Laterality: N/A;  . HERNIA REPAIR    . HOT HEMOSTASIS N/A 01/24/2016   Procedure: HOT HEMOSTASIS (ARGON PLASMA COAGULATION/BICAP);  Surgeon: Wonda Horner, MD;  Location: [facility] ENDOSCOPY;  Service: Endoscopy;  Laterality: N/A;  . IR ANGIOGRAM SELECTIVE EACH ADDITIONAL VESSEL  04/04/2017  . IR [place] ADDITIONAL VESSEL  04/04/2017  . IR EMBO ART  VEN HEMORR LYMPH EXTRAV  INC GUIDE ROADMAPPING  04/04/2017  . IR EMBO ART  VEN HEMORR LYMPH EXTRAV  INC GUIDE ROADMAPPING  04/04/2017  . IR RADIOLOGIST EVAL & MGMT  05/07/2017  . IR TIPS  04/04/2017  . IR US GUIDE VASC ACCESS RIGHT  04/04/2017  . IR VENOGRAM RENAL UNI RIGHT  04/04/2017  . RADIOLOGY WITH ANESTHESIA N/A 04/04/2017   Procedure: RADIOLOGY WITH ANESTHESIA;  Surgeon: Radiologist, Medication, MD;  Location: [facility];  Service: Radiology;  Laterality: N/A;  . RIGHT/LEFT HEART CATH AND CORONARY ANGIOGRAPHY N/A 04/15/2017   Procedure: Right/Left Heart Cath and Coronary Angiography;  Surgeon: Martinique, Peter M, MD;  Location: [facility] CV LAB;  Service: Cardiovascular;  Laterality: N/A;        Home Medications    Prior to Admission medications   Medication Sig Start Date End Date Taking? Authorizing Provider  acetaminophen (TYLENOL) 500 MG tablet Take 500-1,000 mg by mouth every 8 (eight) hours as needed (for pain).    Yes [provider]  aspirin EC 81 MG tablet Take 81 mg by mouth daily.   Yes [provider]  atorvastatin (LIPITOR) 20 MG tablet Take 1 tablet (20 mg total) by mouth daily. 04/17/17  Yes Minus Liberty, MD  famotidine (PEPCID) 20 MG tablet Take 20 mg by mouth daily.  09/29/18  Yes [provider]  ferrous sulfate 325 (65 FE) MG tablet Take 1 tablet (325 mg total) by mouth 3 (three) times daily with meals. Patient taking differently: Take 325 mg by mouth 2 (two) times daily with a meal.  07/17/15  Yes Dunn, Dayna N, PA-C  folic acid (FOLVITE) 1 MG tablet Take 1 tablet (1 mg total) by mouth daily. 01/02/16  Yes Thurnell Lose, MD  furosemide (LASIX) 80 MG tablet Take 80 mg by mouth daily.   Yes [provider]  gabapentin (NEURONTIN) 100 MG capsule Take 1 capsule (100 mg total) by mouth 3 (three) times daily. Patient taking differently: Take 100 mg by mouth at bedtime.  11/12/18  Yes Penumalli, Earlean Polka, MD  lactulose (CHRONULAC) 10 GM/15ML solution Take 45 mLs (30 g total) by mouth 3 (three) times daily.  01/04/18  Yes Elwin Mocha, MD  nitroGLYCERIN (NITROSTAT) 0.4 MG SL tablet Place 0.4 mg under the tongue every 5 (five) minutes as needed for chest pain.   Yes [provider]  nortriptyline (PAMELOR) 10 MG capsule Take 10 mg by mouth daily.  10/26/18  Yes [provider]  omeprazole (PRILOSEC) 40 MG capsule Take 40 mg by mouth daily.   Yes [provider]  spironolactone (ALDACTONE) 50 MG tablet Take 1 tablet (50 mg total) by mouth daily. Patient not taking: Reported on 12/26/2018 04/17/17   Minus Liberty, MD  thiamine 100 MG tablet Take 1 tablet (100 mg total) by mouth daily. Patient not taking: Reported on 12/26/2018 01/02/16   Thurnell Lose, MD    Family History Family History  Problem Relation Age of Onset  . Liver cancer Mother   . Diabetes Mellitus II Sister     Social History Social History   Tobacco  Use  . Smoking status: Former Smoker    Packs/day: 2.00    Years: 40.00    Pack years: 80.00    Types: Cigarettes    Last attempt to quit: 08/14/2008    Years since quitting: 10.3  . Smokeless tobacco: Never Used  Substance Use Topics  . Alcohol use: Yes    Alcohol/week: 0.0 standard drinks    Comment: 11/12/18 quit 2016, 1 pint whiskey daily several beer weekly    01/2016  " no alcohol in 3 weeks "  . Drug use: No    Comment: quit 2016     Allergies   Penicillins   Review of Systems Review of Systems  Constitutional: Positive for activity change.  Respiratory: Positive for shortness of breath.   Cardiovascular: Positive for chest pain.  Allergic/Immunologic: Negative for immunocompromised state.  Hematological: Does not bruise/bleed easily.  All other systems reviewed and are negative.    Physical Exam Updated Vital Signs BP (!) 107/55   Pulse (!) 108   Temp 98.1 F (36.7 C) (Oral)   Resp 13   Ht 6' (1.829 m)   Wt 95.3 kg   SpO2 97%   BMI 28.48 kg/m   Physical Exam Vitals signs and nursing note reviewed.  Constitutional:      Appearance: He is well-developed.  HENT:     Head: Atraumatic.  Neck:     Musculoskeletal: Neck supple.  Cardiovascular:     Rate and Rhythm: Tachycardia present.  Pulmonary:     Effort: Pulmonary effort is normal.  Musculoskeletal:     Right lower leg: He exhibits tenderness. Edema present.     Left lower leg: He exhibits tenderness. Edema present.  Skin:    General: Skin is warm.  Neurological:     Mental Status: He is alert and oriented to person, place, and time.      ED Treatments / Results  Labs (all labs ordered are listed, but only abnormal results are displayed) Labs Reviewed  BASIC METABOLIC PANEL - Abnormal; Notable for the following components:      Result Value   Sodium 134 (*)    CO2 19 (*)    Glucose, Bld 208 (*)    All other components within normal limits  CBC - Abnormal; Notable for the following  components:   RBC 3.27 (*)    Hemoglobin 10.6 (*)    HCT 32.7 (*)    Platelets 149 (*)    All other components within normal limits  TROPONIN I  I-STAT TROPONIN, ED  I-STAT TROPONIN, ED    EKG EKG Interpretation  Date/Time:  Friday December 26 2018 03:25:24 EST Ventricular Rate:  119 PR Interval:  154 QRS Duration: 76 QT Interval:  336 QTC Calculation: 472 R Axis:   -14 Text Interpretation:  Sinus tachycardia Right atrial enlargement Borderline ECG No acute changes No significant change since last tracing Confirmed by Varney Biles [phone]) on 12/26/2018 5:42:02 AM   Radiology Dg Chest 2 View  Result Date: 12/26/2018 CLINICAL DATA:  Chest pain and shortness of breath for few hours. History of CHF and COPD. EXAM: CHEST - 2 VIEW COMPARISON:  Chest radiograph November 21, 2018 FINDINGS: Cardiomediastinal silhouette is normal. Calcified aortic arch. Similar chronic interstitial changes and increased lung volumes without pleural effusion or focal consolidation. No pneumothorax. Osteopenia. Degenerative change of the thoracic spine. Coil embolic material projecting at GE junction. IMPRESSION: 1. No acute cardiopulmonary process. 2.  Emphysema (ICD10-J43.9).  Aortic Atherosclerosis (ICD10-I70.0). Electronically Signed   By: Elon Alas M.D.   On: 12/26/2018 03:46    Procedures Procedures (including critical care time)  Medications Ordered in ED Medications  sodium chloride flush (NS) 0.9 % injection 3 mL (3 mLs Intravenous Given 12/26/18 0647)     Initial Impression / Assessment and Plan / ED Course  I have reviewed the triage vital signs and the nursing notes.  Pertinent labs & imaging results that were available during my care of the patient were reviewed by me and considered in my medical decision making (see chart for details).    Patient comes in with chief complaint of chest pain. He has hx of CAD - medically managed. CP was typical. Currently chest pain free. He is  tachycardic- not sure why. No clinical concerns for infection. Pt has had neg DVT workup - and we dont think he has PE at this time.  Final Clinical Impressions(s) / ED Diagnoses   Final diagnoses:  Angina pectoris [facility])    ED Discharge Orders    None       Varney Biles, MD 12/26/18 [phone]

## 2018-12-26 NOTE — H&P (Signed)
History and Physical    [name] HKV:[id number] DOB: October 18, 1951 DOA: 12/26/2018  PCP: Leonard Downing, MD  Patient coming from: home   I have personally briefly reviewed patient's old medical records in [facility]  everywhere.   Chief Complaint: chest pain   HPI: [facility] is a 68 y.o. male with medical history significant of alcoholic Warren disease, history of hepatic encephalopathy status post TIPS procedure, nonobstructive coronary artery disease, COPD, history of GI bleed, hypertension who presents to the emergency room with 1 episode of left precordial chest pain.  According to the patient, it started last night when he was turning on his left side, on his left lateral chest and radiating to the left upper arm, moderate in intensity, achy and sharp,Intermittent, on a second episode he came to the ER.  He did have old nitroglycerin about 70 years old from last time he had a cardiac problem and took that nitroglycerin with improvement.  He had some shortness of breath worse than usual yesterday but denies any other symptoms.  By the time he is in the emergency room, he is chest pain-free.  Denies any cough, cold, flulike symptoms.  Denies any trauma. ED Course: Hemodynamically stable in the emergency room.  Electrolytes are normal.  Initial troponins are nonischemic.  Twelve-lead EKG is nonischemic.  Patient currently chest pain-free.Chest x-ray is essentially normal.  Due to significant history, patient was assessed for admission and to rule out acute coronary syndrome.  Review of Systems: As per HPI otherwise 10 point review of systems negative.    Past Medical History:  Diagnosis Date  . Alcohol abuse   . Anemia   . Arthritis   . AVM (arteriovenous malformation) of colon    a. By colonoscopy 07/2015.  [facility] ([place])   . Colon polyps   . COPD (chronic obstructive pulmonary disease) ([place])   . Diastolic CHF ([facility])   . GERD (gastroesophageal reflux disease)     . GI bleed 01/2016  . Headache    cluster headaches  . History of blood transfusion   . History of hiatal hernia   . Hypertension   . Hypokalemia   . Internal hemorrhoids   . Portal hypertensive gastropathy ([facility])    a. By EGD 07/2015.  . Sigmoid diverticulosis   . SVT (supraventricular tachycardia) ([facility])    a. 07/2015 in setting of severe hypokalemia.  . Symptomatic anemia    a. 07/2015 - felt multifactorial from cirrhosis and AVMs.  . Thrombocytopenia ([facility])   . Tobacco use     Past Surgical History:  Procedure Laterality Date  . CATARACT EXTRACTION    . COLONOSCOPY N/A 07/15/2015   Procedure: COLONOSCOPY;  Surgeon: Wilford Corner, MD;  Location: [facility];  Service: [facility];  Laterality: N/A;  . COLONOSCOPY WITH PROPOFOL N/A 04/04/2017   Procedure: COLONOSCOPY WITH PROPOFOL;  Surgeon: Ronnette Juniper, MD;  Location: [facility];  Service: Gastroenterology;  Laterality: N/A;  . ESOPHAGOGASTRODUODENOSCOPY N/A 07/15/2015   Procedure: ESOPHAGOGASTRODUODENOSCOPY (EGD);  Surgeon: Wilford Corner, MD;  Location: [facility];  Service: Warren;  Laterality: N/A;  . ESOPHAGOGASTRODUODENOSCOPY N/A 12/30/2015   Procedure: ESOPHAGOGASTRODUODENOSCOPY (EGD);  Surgeon: Ronald Lobo, MD;  Location: [facility];  Service: [facility];  Laterality: N/A;  . ESOPHAGOGASTRODUODENOSCOPY N/A 03/30/2017   Procedure: ESOPHAGOGASTRODUODENOSCOPY (EGD);  Surgeon: Laurence Spates, MD;  Location: [facility];  Service: [facility];  Laterality: N/A;  . ESOPHAGOGASTRODUODENOSCOPY N/A 04/04/2017   Procedure: ESOPHAGOGASTRODUODENOSCOPY (EGD);  Surgeon: Arta Silence, MD;  Location: [facility];  Service: [facility];  Laterality: N/A;  . ESOPHAGOGASTRODUODENOSCOPY (EGD) WITH PROPOFOL N/A 01/24/2016   Procedure: ESOPHAGOGASTRODUODENOSCOPY (EGD) WITH PROPOFOL;  Surgeon: Wonda Horner, MD;  Location: [facility];  Service: Warren;  Laterality: N/A;  . ESOPHAGOGASTRODUODENOSCOPY (EGD) WITH PROPOFOL N/A 09/18/2017    Procedure: ESOPHAGOGASTRODUODENOSCOPY (EGD) WITH PROPOFOL;  Surgeon: Wilford Corner, MD;  Location: [place];  Service: Warren;  Laterality: N/A;  . GIVENS CAPSULE STUDY N/A 04/02/2017   Procedure: GIVENS CAPSULE STUDY;  Surgeon: Arta Silence, MD;  Location: [facility];  Service: [facility];  Laterality: N/A;  . HERNIA REPAIR    . HOT HEMOSTASIS N/A 01/24/2016   Procedure: HOT HEMOSTASIS (ARGON PLASMA COAGULATION/BICAP);  Surgeon: Wonda Horner, MD;  Location: [facility];  Service: [facility];  Laterality: N/A;  . IR ANGIOGRAM SELECTIVE EACH ADDITIONAL VESSEL  04/04/2017  . IR [place] ADDITIONAL VESSEL  04/04/2017  . IR EMBO ART  VEN HEMORR LYMPH EXTRAV  Warren GUIDE ROADMAPPING  04/04/2017  . IR EMBO ART  VEN HEMORR LYMPH EXTRAV  Warren GUIDE ROADMAPPING  04/04/2017  . IR RADIOLOGIST EVAL & MGMT  05/07/2017  . IR TIPS  04/04/2017  . IR US GUIDE VASC ACCESS RIGHT  04/04/2017  . IR VENOGRAM RENAL UNI RIGHT  04/04/2017  . RADIOLOGY WITH ANESTHESIA N/A 04/04/2017   Procedure: RADIOLOGY WITH ANESTHESIA;  Surgeon: Radiologist, Medication, MD;  Location: [facility];  Service: Radiology;  Laterality: N/A;  . RIGHT/LEFT HEART CATH AND CORONARY ANGIOGRAPHY N/A 04/15/2017   Procedure: Right/Left Heart Cath and Coronary Angiography;  Surgeon: Martinique, Peter M, MD;  Location: [facility];  Service: Cardiovascular;  Laterality: N/A;     reports that he quit smoking about 10 years ago. His smoking use included cigarettes. He has a 80.00 pack-year smoking history. He has never used smokeless tobacco. He reports current alcohol use. He reports that he does not use drugs.  Allergies  Allergen Reactions  . Penicillins Rash    Has patient had a PCN reaction causing immediate rash, facial/tongue/throat swelling, SOB or lightheadedness with hypotension: Yes Has patient had a PCN reaction causing severe rash involving mucus membranes or skin necrosis: No Has patient had a PCN reaction that required  hospitalization: No Has patient had a PCN reaction occurring within the last 10 years: No If all of the above answers are "NO", then may proceed with Cephalosporin use. Had CTX before    Family History  Problem Relation Age of Onset  . Warren cancer Mother   . Diabetes Mellitus II Sister      Prior to Admission medications   Medication Sig Start Date End Date Taking? Authorizing Provider  acetaminophen (TYLENOL) 500 MG tablet Take 500-1,000 mg by mouth every 8 (eight) hours as needed (for pain).   Yes [provider]  aspirin EC 81 MG tablet Take 81 mg by mouth daily.   Yes [provider]  atorvastatin (LIPITOR) 20 MG tablet Take 1 tablet (20 mg total) by mouth daily. 04/17/17  Yes Minus Liberty, MD  famotidine (PEPCID) 20 MG tablet Take 20 mg by mouth daily.  09/29/18  Yes [provider]  ferrous sulfate 325 (65 FE) MG tablet Take 1 tablet (325 mg total) by mouth 3 (three) times daily with meals. Patient taking differently: Take 325 mg by mouth 2 (two) times daily with a meal.  07/17/15  Yes Dunn, Dayna N, PA-C  folic acid (FOLVITE) 1 MG tablet Take 1 tablet (1 mg total)  by mouth daily. 01/02/16  Yes Thurnell Lose, MD  furosemide (LASIX) 80 MG tablet Take 80 mg by mouth daily.   Yes [provider]  gabapentin (NEURONTIN) 100 MG capsule Take 1 capsule (100 mg total) by mouth 3 (three) times daily. Patient taking differently: Take 100 mg by mouth at bedtime.  11/12/18  Yes Penumalli, Earlean Polka, MD  lactulose (CHRONULAC) 10 GM/15ML solution Take 45 mLs (30 g total) by mouth 3 (three) times daily. 01/04/18  Yes Elwin Mocha, MD  nitroGLYCERIN (NITROSTAT) 0.4 MG SL tablet Place 0.4 mg under the tongue every 5 (five) minutes as needed for chest pain.   Yes [provider]  nortriptyline (PAMELOR) 10 MG capsule Take 10 mg by mouth daily.  10/26/18  Yes [provider]  omeprazole (PRILOSEC) 40 MG capsule Take 40 mg by mouth daily.    Yes [provider]    Physical Exam: Vitals:   12/26/18 0715 12/26/18 0727 12/26/18 0730 12/26/18 0800  BP: (!) 107/55  117/61 (!) 109/53  Pulse: (!) 108  (!) 110 (!) 107  Resp: 13  10 13   Temp:      TempSrc:      SpO2: 97%  98% 96%  Weight:  95.3 kg    Height:  6' (1.829 m)      Constitutional: NAD, calm, comfortable Vitals:   12/26/18 0715 12/26/18 0727 12/26/18 0730 12/26/18 0800  BP: (!) 107/55  117/61 (!) 109/53  Pulse: (!) 108  (!) 110 (!) 107  Resp: 13  10 13   Temp:      TempSrc:      SpO2: 97%  98% 96%  Weight:  95.3 kg    Height:  6' (1.829 m)     Eyes: PERRL, lids and conjunctivae normal, patient is comfortable on room air. ENMT: Mucous membranes are moist. Posterior pharynx clear of any exudate or lesions.Normal dentition.  Neck: normal, supple, no masses, no thyromegaly Respiratory: clear to auscultation bilaterally, no wheezing, no crackles. Normal respiratory effort. No accessory muscle use.  Cardiovascular: Regular rate and rhythm, no murmurs / rubs / gallops. No extremity edema. 2+ pedal pulses. No carotid bruits.  No reproducible tenderness. Abdomen: no tenderness, no masses palpated. No Hepatosplenomegaly. Bowel sounds positive.  Musculoskeletal: no clubbing / cyanosis. No joint deformity upper and lower extremities. Good ROM, no contractures. Normal muscle tone.  Skin: no rashes, lesions, ulcers. No induration Neurologic: CN 2-12 grossly intact. Sensation intact, DTR normal. Strength 5/5 in all 4.  No tremors. Psychiatric: Normal judgment and insight. Alert and oriented x 3. Normal mood.     Labs on Admission: I have personally reviewed following labs and imaging studies  CBC: Recent Labs  Lab 12/26/18 0326  WBC 8.2  HGB 10.6*  HCT 32.7*  MCV 100.0  PLT 921*   Basic Metabolic Panel: Recent Labs  Lab 12/26/18 0326  NA 134*  K 4.9  CL 105  CO2 19*  GLUCOSE 208*  BUN 14  CREATININE 1.12  CALCIUM 9.2   GFR: Estimated  Creatinine Clearance: 74.6 mL/min (by C-G formula based on SCr of 1.12 mg/dL). Warren Function Tests: No results for input(s): AST, ALT, ALKPHOS, BILITOT, PROT, ALBUMIN in the last 168 hours. No results for input(s): LIPASE, AMYLASE in the last 168 hours. No results for input(s): AMMONIA in the last 168 hours. Coagulation Profile: No results for input(s): INR, PROTIME in the last 168 hours. Cardiac Enzymes: No results for input(s): CKTOTAL, CKMB, CKMBINDEX, TROPONINI  in the last 168 hours. BNP (last 3 results) No results for input(s): PROBNP in the last 8760 hours. HbA1C: No results for input(s): HGBA1C in the last 72 hours. CBG: No results for input(s): GLUCAP in the last 168 hours. Lipid Profile: No results for input(s): CHOL, HDL, LDLCALC, TRIG, CHOLHDL, LDLDIRECT in the last 72 hours. Thyroid Function Tests: No results for input(s): TSH, T4TOTAL, FREET4, T3FREE, THYROIDAB in the last 72 hours. Anemia Panel: No results for input(s): VITAMINB12, FOLATE, FERRITIN, TIBC, IRON, RETICCTPCT in the last 72 hours. Urine analysis:    Component Value Date/Time   COLORURINE YELLOW 11/29/2018 [place] 11/29/2018 2359   LABSPEC 1.024 11/29/2018 2359   PHURINE 7.0 11/29/2018 2359   GLUCOSEU >=500 (A) 11/29/2018 2359   HGBUR NEGATIVE 11/29/2018 2359   BILIRUBINUR NEGATIVE 11/29/2018 2359   KETONESUR 5 (A) 11/29/2018 2359   PROTEINUR NEGATIVE 11/29/2018 2359   UROBILINOGEN 1.0 07/12/2015 1019   NITRITE NEGATIVE 11/29/2018 2359   LEUKOCYTESUR NEGATIVE 11/29/2018 2359    Radiological Exams on Admission: Dg Chest 2 View  Result Date: 12/26/2018 CLINICAL DATA:  Chest pain and shortness of breath for few hours. History of CHF and COPD. EXAM: CHEST - 2 VIEW COMPARISON:  Chest radiograph November 21, 2018 FINDINGS: Cardiomediastinal silhouette is normal. Calcified aortic arch. Similar chronic interstitial changes and increased lung volumes without pleural effusion or focal  consolidation. No pneumothorax. Osteopenia. Degenerative change of the thoracic spine. Coil embolic material projecting at GE junction. IMPRESSION: 1. No acute cardiopulmonary process. 2.  Emphysema (ICD10-J43.9).  Aortic Atherosclerosis (ICD10-I70.0). Electronically Signed   By: Elon Alas M.D.   On: 12/26/2018 03:46    EKG: Independently reviewed. Sinus tachycardia, no John-T wave changes.  Comparable to old EKG.  Assessment/Plan Principal Problem:   Chest pain Active Problems:   Iron deficiency anemia due to chronic blood loss   COPD (chronic obstructive pulmonary disease) ([facility])   Cirrhosis of Warren with ascites ([facility])   Coronary artery disease   Chronic Warren failure ([facility])   Chest pain: Suspect atypical chest pain.  However, given multiple risk factors and heart score more than 6, monitor in the hospital.  Will keep on telemetry.  Cycle troponins and EKG.  Will order echocardiogram today.  If any abnormal troponins are echocardiogram will consult cardiology. He is on aspirin at home, will resume.  Morphine as needed for pain.  Oxygen therapy as needed to keep saturations more than 90%.  Currently chest pain-free, will treat with nitroglycerin if recurrence of chest pain.  Alcoholic Warren cirrhosis status post TIPS, chronic Warren disease with history of hepatic encephalopathy: Patient currently neurologically stable.  He is on lactulose at home, does not have as much bowel movement as needed.  We discussed in details.  They are going to take lactulose 3 or 4 times a day to have 3 or 4 loose bowel movement a day.  Apparently, he was advised to stop rifaximin as that was causing questionable constipation.  COPD: Fairly stable.  Bronchodilator as needed.  Nonobstructive coronary artery disease:  As above.  Rule out acute coronary syndrome.  Continue aspirin, statin.   DVT prophylaxis: SCDs Code Status: Full code Family Communication: Significant others at the bedside Disposition  Plan: Home Consults called: None Admission status: Observation cardiac telemetry.   Barb Merino MD [facility] Pager [phone]  If 7PM-7AM, please contact night-coverage [email or web address] Password [facility]  12/26/2018, 8:17 AM

## 2018-12-26 NOTE — Consult Note (Addendum)
Cardiology Consultation:   Patient ID: JAMEIL WHITMOYER MRN: [id number]; DOB: [birth year]  Admit date: 12/26/2018 Date of Consult: 12/26/2018  Primary Care Provider: Leonard Downing, MD Primary Cardiologist: Dr. Wynonia Lawman Dr. Bettina Gavia  Primary Electrophysiologist:  None   Patient Profile:   Mr. Mcintyre is a 68 y.o. male with a hx of CAD, aortic stenosis, HTN, DLD, hepatic cirrhosis s/p TIPS procedure, h/o GI bleed with Type 2 MI May in 2018 and chronic thrombocytopenia who is being seen today for evaluation of CP, at the request of Dr. Sloan Leiter, Internal Medicine.   History of Present Illness:   Mr. Eoff is a 68 y.o. male with a hx of CAD, aortic stenosis, HTN, DLD, hepatic cirrhosis status post TIPS procedure, h/o GI bleed with Type 2 MI May in 2018 and chronic thrombocytopenia who is being seen today for evaluation of CP, at the request of Dr. Sloan Leiter, Internal Medicine.  He has been followed by Dr. Wynonia Lawman but recently seen by Dr. Bettina Gavia 11/14/18.   Per records, coronary angiography was performed 04/05/2017 and showed less than 50% left main proximal LAD and diagonal stenosis 60% proximal left circumflex 80% mid left circumflex 50% proximal right coronary artery ejection fraction 50%.  Medical management was recommended. Echocardiogram 05/23/2018 showed normal  EF 55% mild LVH aortic stenosis with a valve area 1.47 cm peak gradient 28 mean gradient 16 mmHg.  At the time of heart catheterization his measured mean gradient was 18. Valve area was 1.46.   He presented to the [facility] ED today w/ CC of chest pain. He was in his usual state of health until early this morning. He developed an episode of left sided CP around 1AM that woke him from his sleep. Chest pressure. Tight. Also felt like indigestion. Last meal was dinner which consisted of cube steak, mashed potatoes and gravy around 7PM. He tried Pepcid w/o relief. Took SL nitro and eased off after 10 min but later returned again around 2AM, this time  radiating down his left arm. His 2nd episode also eased off w/ SL nitro and his wife urged him to come to the ED. He is currently CP free. No recurrent pain since 2 AM this morning. Troponin is mildly elevated at 0.04 x 2. Echo done today shows normal LVEF at 55-60% with normal wall motion. There has been mild progression of his aortic stenosis, now moderate w/ mean gradient at 32 mmHg. Valve area 1.36 cm2.  BP stable but soft 105/51. Tele shows sinus tach in the 110s. hgb 10.6. Plt ct 149.   Past Medical History:  Diagnosis Date  . Alcohol abuse   . Anemia   . Arthritis   . AVM (arteriovenous malformation) of colon    a. By colonoscopy 07/2015.  Marland Kitchen Cirrhosis of liver ([place])   . Colon polyps   . COPD (chronic obstructive pulmonary disease) ([place])   . Diastolic CHF ([place])   . GERD (gastroesophageal reflux disease)   . GI bleed 01/2016  . Headache    cluster headaches  . History of blood transfusion   . History of hiatal hernia   . Hypertension   . Hypokalemia   . Internal hemorrhoids   . Portal hypertensive gastropathy ([place])    a. By EGD 07/2015.  . Sigmoid diverticulosis   . SVT (supraventricular tachycardia) ([place])    a. 07/2015 in setting of severe hypokalemia.  . Symptomatic anemia    a. 07/2015 - felt multifactorial from cirrhosis and AVMs.  . Thrombocytopenia (  [place])   . Tobacco use     Past Surgical History:  Procedure Laterality Date  . CATARACT EXTRACTION    . COLONOSCOPY N/A 07/15/2015   Procedure: COLONOSCOPY;  Surgeon: Wilford Corner, MD;  Location: [facility] ENDOSCOPY;  Service: Endoscopy;  Laterality: N/A;  . COLONOSCOPY WITH PROPOFOL N/A 04/04/2017   Procedure: COLONOSCOPY WITH PROPOFOL;  Surgeon: Ronnette Juniper, MD;  Location: [place];  Service: Gastroenterology;  Laterality: N/A;  . ESOPHAGOGASTRODUODENOSCOPY N/A 07/15/2015   Procedure: ESOPHAGOGASTRODUODENOSCOPY (EGD);  Surgeon: Wilford Corner, MD;  Location: [facility] ENDOSCOPY;  Service: Endoscopy;  Laterality: N/A;  .  ESOPHAGOGASTRODUODENOSCOPY N/A 12/30/2015   Procedure: ESOPHAGOGASTRODUODENOSCOPY (EGD);  Surgeon: Ronald Lobo, MD;  Location: [facility] ENDOSCOPY;  Service: Endoscopy;  Laterality: N/A;  . ESOPHAGOGASTRODUODENOSCOPY N/A 03/30/2017   Procedure: ESOPHAGOGASTRODUODENOSCOPY (EGD);  Surgeon: Laurence Spates, MD;  Location: [facility] ENDOSCOPY;  Service: Endoscopy;  Laterality: N/A;  . ESOPHAGOGASTRODUODENOSCOPY N/A 04/04/2017   Procedure: ESOPHAGOGASTRODUODENOSCOPY (EGD);  Surgeon: Arta Silence, MD;  Location: [facility] ENDOSCOPY;  Service: Endoscopy;  Laterality: N/A;  . ESOPHAGOGASTRODUODENOSCOPY (EGD) WITH PROPOFOL N/A 01/24/2016   Procedure: ESOPHAGOGASTRODUODENOSCOPY (EGD) WITH PROPOFOL;  Surgeon: Wonda Horner, MD;  Location: [facility] ENDOSCOPY;  Service: Endoscopy;  Laterality: N/A;  . ESOPHAGOGASTRODUODENOSCOPY (EGD) WITH PROPOFOL N/A 09/18/2017   Procedure: ESOPHAGOGASTRODUODENOSCOPY (EGD) WITH PROPOFOL;  Surgeon: Wilford Corner, MD;  Location: [place];  Service: Endoscopy;  Laterality: N/A;  . GIVENS CAPSULE STUDY N/A 04/02/2017   Procedure: GIVENS CAPSULE STUDY;  Surgeon: Arta Silence, MD;  Location: [facility] ENDOSCOPY;  Service: Endoscopy;  Laterality: N/A;  . HERNIA REPAIR    . HOT HEMOSTASIS N/A 01/24/2016   Procedure: HOT HEMOSTASIS (ARGON PLASMA COAGULATION/BICAP);  Surgeon: Wonda Horner, MD;  Location: [facility] ENDOSCOPY;  Service: Endoscopy;  Laterality: N/A;  . IR ANGIOGRAM SELECTIVE EACH ADDITIONAL VESSEL  04/04/2017  . IR [place] ADDITIONAL VESSEL  04/04/2017  . IR EMBO ART  VEN HEMORR LYMPH EXTRAV  INC GUIDE ROADMAPPING  04/04/2017  . IR EMBO ART  VEN HEMORR LYMPH EXTRAV  INC GUIDE ROADMAPPING  04/04/2017  . IR RADIOLOGIST EVAL & MGMT  05/07/2017  . IR TIPS  04/04/2017  . IR US GUIDE VASC ACCESS RIGHT  04/04/2017  . IR VENOGRAM RENAL UNI RIGHT  04/04/2017  . RADIOLOGY WITH ANESTHESIA N/A 04/04/2017   Procedure: RADIOLOGY WITH ANESTHESIA;  Surgeon: Radiologist, Medication, MD;  Location: [place];  Service:  Radiology;  Laterality: N/A;  . RIGHT/LEFT HEART CATH AND CORONARY ANGIOGRAPHY N/A 04/15/2017   Procedure: Right/Left Heart Cath and Coronary Angiography;  Surgeon: Martinique, Bryelle Spiewak M, MD;  Location: [place] CV LAB;  Service: Cardiovascular;  Laterality: N/A;     Home Medications:  Prior to Admission medications   Medication Sig Start Date End Date Taking? Authorizing Provider  acetaminophen (TYLENOL) 500 MG tablet Take 500-1,000 mg by mouth every 8 (eight) hours as needed (for pain).   Yes [provider]  aspirin EC 81 MG tablet Take 81 mg by mouth daily.   Yes [provider]  atorvastatin (LIPITOR) 20 MG tablet Take 1 tablet (20 mg total) by mouth daily. 04/17/17  Yes Minus Liberty, MD  famotidine (PEPCID) 20 MG tablet Take 20 mg by mouth daily.  09/29/18  Yes [provider]  ferrous sulfate 325 (65 FE) MG tablet Take 1 tablet (325 mg total) by mouth 3 (three) times daily with meals. Patient taking differently: Take 325 mg by mouth 2 (two) times daily with a meal.  07/17/15  Yes Dunn,  Dayna N, PA-C  folic acid (FOLVITE) 1 MG tablet Take 1 tablet (1 mg total) by mouth daily. 01/02/16  Yes Thurnell Lose, MD  furosemide (LASIX) 80 MG tablet Take 80 mg by mouth daily.   Yes [provider]  gabapentin (NEURONTIN) 100 MG capsule Take 1 capsule (100 mg total) by mouth 3 (three) times daily. Patient taking differently: Take 100 mg by mouth at bedtime.  11/12/18  Yes Penumalli, Earlean Polka, MD  lactulose (CHRONULAC) 10 GM/15ML solution Take 45 mLs (30 g total) by mouth 3 (three) times daily. 01/04/18  Yes Elwin Mocha, MD  nitroGLYCERIN (NITROSTAT) 0.4 MG SL tablet Place 0.4 mg under the tongue every 5 (five) minutes as needed for chest pain.   Yes [provider]  nortriptyline (PAMELOR) 10 MG capsule Take 10 mg by mouth daily.  10/26/18  Yes [provider]  omeprazole (PRILOSEC) 40 MG capsule Take 40 mg by mouth daily.   Yes [provider]    Inpatient Medications: Scheduled Meds: . aspirin EC  81 mg Oral Daily  . atorvastatin  20 mg Oral Daily  . famotidine  20 mg Oral Daily  . ferrous sulfate  325 mg Oral BID WC  . folic acid  1 mg Oral Daily  . furosemide  80 mg Oral Daily  . gabapentin  100 mg Oral QHS  . lactulose  30 g Oral TID  . nortriptyline  10 mg Oral Daily  . pantoprazole  40 mg Oral Daily   Continuous Infusions:  PRN Meds: acetaminophen, morphine injection, nitroGLYCERIN, ondansetron (ZOFRAN) IV  Allergies:    Allergies  Allergen Reactions  . Penicillins Rash    Has patient had a PCN reaction causing immediate rash, facial/tongue/throat swelling, SOB or lightheadedness with hypotension: Yes Has patient had a PCN reaction causing severe rash involving mucus membranes or skin necrosis: No Has patient had a PCN reaction that required hospitalization: No Has patient had a PCN reaction occurring within the last 10 years: No If all of the above answers are "NO", then may proceed with Cephalosporin use. Had CTX before    Social History:   Social History   Socioeconomic History  . Marital status: Single    Spouse name: Not on file  . Number of children: 0  . Years of education: 62  . Highest education level: Not on file  Occupational History    Comment: retired  Scientific laboratory technician  . Financial resource strain: Not on file  . Food insecurity:    Worry: Not on file    Inability: Not on file  . Transportation needs:    Medical: Not on file    Non-medical: Not on file  Tobacco Use  . Smoking status: Former Smoker    Packs/day: 2.00    Years: 40.00    Pack years: 80.00    Types: Cigarettes    Last attempt to quit: 08/14/2008    Years since quitting: 10.3  . Smokeless tobacco: Never Used  Substance and Sexual Activity  . Alcohol use: Yes    Alcohol/week: 0.0 standard drinks    Comment: 11/12/18 quit 2016, 1 pint whiskey daily several beer weekly    01/2016  " no alcohol in 3 weeks "   . Drug use: No    Comment: quit 2016  . Sexual activity: Not on file  Lifestyle  . Physical activity:    Days per week: Not on file    Minutes per session: Not  on file  . Stress: Not on file  Relationships  . Social connections:    Talks on phone: Not on file    Gets together: Not on file    Attends religious service: Not on file    Active member of club or organization: Not on file    Attends meetings of clubs or organizations: Not on file    Relationship status: Not on file  . Intimate partner violence:    Fear of current or ex partner: Not on file    Emotionally abused: Not on file    Physically abused: Not on file    Forced sexual activity: Not on file  Other Topics Concern  . Not on file  Social History Narrative   Lives with sig other, Debbie   Caffeine coffee, 2 cups daily    Family History:    Family History  Problem Relation Age of Onset  . Liver cancer Mother   . Diabetes Mellitus II Sister      ROS:  Please see the history of present illness.   All other ROS reviewed and negative.     Physical Exam/Data:   Vitals:   12/26/18 0800 12/26/18 0815 12/26/18 0859 12/26/18 1100  BP: (!) 109/53 (!) 113/56 (!) 112/59 (!) 105/51  Pulse: (!) 107 (!) 108 (!) 113 (!) 119  Resp: 13 13 18    Temp:   98 F (36.7 C) 98.6 F (37 C)  TempSrc:   Oral Oral  SpO2: 96% 96% 98% 100%  Weight:      Height:        Intake/Output Summary (Last 24 hours) at 12/26/2018 1537 Last data filed at 12/26/2018 1235 Gross per 24 hour  Intake 480 ml  Output -  Net 480 ml   Last 3 Weights 12/26/2018 11/29/2018 11/29/2018  Weight (lbs) 210 lb 198 lb 194 lb 4.8 oz  Weight (kg) 95.255 kg 89.812 kg 88.134 kg     Body mass index is 28.48 kg/m.  General:  Elderly WM, in no acute distress HEENT: normal Lymph: no adenopathy Neck: no JVD Endocrine:  No thryomegaly Vascular: No carotid bruits; FA pulses 2+ bilaterally without bruits  Cardiac:  normal S1, S2; RRR; no murmur  Lungs:   clear to auscultation bilaterally, no wheezing, rhonchi or rales  Abd: mildly distended non tender Ext: 1+ bilateral LEE Musculoskeletal:  No deformities, BUE and BLE strength normal and equal Skin: warm and dry  Neuro:  CNs 2-12 intact, no focal abnormalities noted Psych:  Normal affect   EKG:  The EKG was personally reviewed and demonstrates:  Sinus tachy 110s Telemetry:  Telemetry was personally reviewed and demonstrates: sinus tach 110s  Relevant CV Studies: [facility] 04/15/17 Procedures   Right/Left Heart Cath and Coronary Angiography  Conclusion     Prox RCA lesion, 50 %stenosed.  Mid RCA lesion, 30 %stenosed.  Ost LM lesion, 30 %stenosed.  Prox LAD lesion, 35 %stenosed.  Prox LAD to Mid LAD lesion, 35 %stenosed.  1st Diag lesion, 40 %stenosed.  Ost Ramus to Ramus lesion, 30 %stenosed.  Prox Cx lesion, 60 %stenosed.  Mid Cx lesion, 80 %stenosed.  The left ventricular systolic function is normal.  LV end diastolic pressure is normal.  The left ventricular ejection fraction is 55-65% by visual estimate.  There is mild aortic valve stenosis.  LV end diastolic pressure is normal.   1. Diffuse but predominantly nonobstructive CAD. Only the distal LCx is 80%. 2. Normal LV function 3. Normal right  heart pressures with low LV filling pressures. 4. Mild - moderate aortic stenosis. Mean gradient of 18 mm Hg. AVA 1.46 cm squared.    Coronary Diagrams   Diagnostic  Dominance: Right    Intervention     2D Echo 12/26/18 Study Conclusions  - Left ventricle: The cavity size was normal. There was mild focal   basal hypertrophy of the septum. Systolic function was normal.   The estimated ejection fraction was in the range of 55% to 60%.   Wall motion was normal; there were no regional wall motion   abnormalities. Doppler parameters are consistent with abnormal   left ventricular relaxation (grade 1 diastolic dysfunction). - Aortic valve: Valve mobility was  restricted. There was moderate   stenosis. - Mitral valve: Calcified annulus. - Left atrium: The atrium was moderately dilated.  Impressions:  - Normal LV systolic function; mild diastolic dysfunction;   calcified aortic valve with moderate AS; moderate LAE.   Laboratory Data:  Chemistry Recent Labs  Lab 12/26/18 0326  NA 134*  K 4.9  CL 105  CO2 19*  GLUCOSE 208*  BUN 14  CREATININE 1.12  CALCIUM 9.2  GFRNONAA >60  GFRAA >60  ANIONGAP 10    No results for input(s): PROT, ALBUMIN, AST, ALT, ALKPHOS, BILITOT in the last 168 hours. Hematology Recent Labs  Lab 12/26/18 0326  WBC 8.2  RBC 3.27*  HGB 10.6*  HCT 32.7*  MCV 100.0  MCH 32.4  MCHC 32.4  RDW 14.1  PLT 149*   Cardiac Enzymes Recent Labs  Lab 12/26/18 0628 12/26/18 0907  TROPONINI 0.04* 0.04*    Recent Labs  Lab 12/26/18 0331 12/26/18 0637  TROPIPOC 0.02 0.02    BNPNo results for input(s): BNP, PROBNP in the last 168 hours.  DDimer No results for input(s): DDIMER in the last 168 hours.  Radiology/Studies:  Dg Chest 2 View  Result Date: 12/26/2018 CLINICAL DATA:  Chest pain and shortness of breath for few hours. History of CHF and COPD. EXAM: CHEST - 2 VIEW COMPARISON:  Chest radiograph November 21, 2018 FINDINGS: Cardiomediastinal silhouette is normal. Calcified aortic arch. Similar chronic interstitial changes and increased lung volumes without pleural effusion or focal consolidation. No pneumothorax. Osteopenia. Degenerative change of the thoracic spine. Coil embolic material projecting at GE junction. IMPRESSION: 1. No acute cardiopulmonary process. 2.  Emphysema (ICD10-J43.9).  Aortic Atherosclerosis (ICD10-I70.0). Electronically Signed   By: Elon Alas M.D.   On: 12/26/2018 03:46    Assessment and Plan:   Mr. Shreffler is a 68 y.o. male with a hx of CAD, aortic stenosis, HTN, DLD, hepatic cirrhosis status post TIPS procedure, h/o GI bleed with Type 2 MI May in 2018 and chronic  thrombocytopenia who is being seen today for evaluation of CP, at the request of Dr. Sloan Leiter, Internal Medicine.    1. Chest Pain: Etiology includes possible coronary ischemia vs GI related CP. He describes 2 episodes of nocturnal chest pain awakening him from his sleep early morning hours at 1 and 2 AM.  Described as chest pressure but also felt like indigestion.  Second episode had radiation to the left arm.  No relief with Pepcid but he did have relief with sublingual nitroglycerin.  He does have known coronary disease as outlined above in HPI.  He is currently chest pain-free.  No chest pain since 2 AM.  Troponin I is mildly elevated with flat trend at 0.042.  EKG shows sinus tachycardia in the 110s.  He denies any  associated dyspnea.  Chest x-ray negative for acute cardiopulmonary process.  Given his hepatic cirrhosis, chronic anemia and chronic thrombocytopenia, he is not a suitable candidate for PCI given the need for dual antiplatelet therapy.  Would recommend continued medical management for CAD and can consider trial of PPI for possible reflux. Recommend voiding however LA nitrates given his aortic stenosis. MD to assess and will provide further recommendations.   2. CAD: cath done in 2018 showed diffuse but predominantly nonobstructive CAD. Only the distal LCx had 80% stenosis. Medical therapy elected at that time.  While his recent chest pain may be secondary to coronary ischemia, his troponin level is mildly elevated but flat @ 0.04 x 3, not the typical rise and fall trend seen with big infarcts. Given his hepatic cirrhosis, chronic anemia and chronic thrombocytopenia, he is not a suitable candidate for PCI given the need for dual antiplatelet therapy.  Would recommend continued medical management for CAD. His soft BP and aortic stenosis however may limit agressive use of antianginals. Continue statin therapy.   3. Aortic Stenosis: echo done today. There has been mild progression of his aortic  stenosis, now moderate w/ mean gradient at 32 mmHg. Valve area 1.36 cm2. EF normal.   4. Cirrhosis: s/p TIPS procedure.   5. Chronic Thrombocytopenia: plt ct 149.      For questions or updates, please contact [place] Please consult [email or web address] for contact info under     Signed, Lyda Jester, PA-C  12/26/2018 3:37 PM  Patient seen and examined and history reviewed. Agree with above findings and plan. 68 yo WM known to me from prior cardiac cath. History of cirrhosis with esophageal varices and prior TIPS procedure. History of CAD mostly nonobstructive by cardiac cath in 2028 except for an 80% distal LCx lesion. He has done well until last night awoke with left chest pain radiating to left arm. No other associated symptoms. States sl Ntg helped but tablets 68 years old. No further chest pain. He has never had this type of chest pain before.   On exam he is in no distress VSS Lungs are clear.  CV RRR with gr 3/6 AS murmur. Abdomen soft NT No edema.  Ecg is without acute change- personally reviewed. Troponin 0.04 with completely flat trend.  Hgb 10.6. plts 149 K  Impresson:  Chest pain. No objective evidence of ischemia and pain has resolved. Given history of cirrhosis and higher bleeding risk with anticoagulation/antiplatelet therapy I would not recommend further invasive evaluation unless he has clear cut ischemia. The only change I would make at this point would be to give him a fresh prescription for sl Ntg. He is OK for discharge from our standpoint.  Aortic stenosis. Moderate. Compared to prior Echo EF has improved. He is not symptomatic from his AS. Not likely a candidate for TAVR regardless.   Egbert Seidel Martinique, [place] 12/26/2018 4:38 PM

## 2018-12-27 DIAGNOSIS — R072 Precordial pain: Secondary | ICD-10-CM | POA: Diagnosis not present

## 2018-12-27 DIAGNOSIS — I208 Other forms of angina pectoris: Secondary | ICD-10-CM | POA: Diagnosis not present

## 2018-12-27 DIAGNOSIS — K7031 Alcoholic cirrhosis of liver with ascites: Secondary | ICD-10-CM | POA: Diagnosis not present

## 2018-12-27 DIAGNOSIS — D500 Iron deficiency anemia secondary to blood loss (chronic): Secondary | ICD-10-CM | POA: Diagnosis not present

## 2018-12-27 DIAGNOSIS — J449 Chronic obstructive pulmonary disease, unspecified: Secondary | ICD-10-CM | POA: Diagnosis not present

## 2018-12-27 DIAGNOSIS — J42 Unspecified chronic bronchitis: Secondary | ICD-10-CM | POA: Diagnosis not present

## 2018-12-27 DIAGNOSIS — K7210 Chronic hepatic failure without coma: Secondary | ICD-10-CM | POA: Diagnosis not present

## 2018-12-27 MED ORDER — GABAPENTIN 100 MG PO CAPS: 100.0000 mg | ORAL_CAPSULE | Freq: Every day | ORAL | Status: AC

## 2018-12-27 MED ORDER — FERROUS SULFATE 325 (65 FE) MG PO TABS: 325.0000 mg | ORAL_TABLET | Freq: Two times a day (BID) | ORAL | Status: AC

## 2018-12-27 NOTE — Progress Notes (Signed)
Progress Note  Patient Name: John Warren Date of Encounter: 12/27/2018  Primary Cardiologist: Bettina Gavia  Subjective   Some SSCP this am relieved with SL nitro BP soft   Inpatient Medications    Scheduled Meds: . aspirin EC  81 mg Oral Daily  . atorvastatin  20 mg Oral Daily  . famotidine  20 mg Oral Daily  . ferrous sulfate  325 mg Oral BID WC  . folic acid  1 mg Oral Daily  . furosemide  80 mg Oral Daily  . gabapentin  100 mg Oral QHS  . lactulose  30 g Oral TID  . nortriptyline  10 mg Oral Daily  . pantoprazole  40 mg Oral Daily   Continuous Infusions:  PRN Meds: acetaminophen, morphine injection, nitroGLYCERIN, ondansetron (ZOFRAN) IV   Vital Signs    Vitals:   12/27/18 0400 12/27/18 0920 12/27/18 0927 12/27/18 0935  BP: (!) 114/48 (!) 100/52 (!) 88/49 113/62  Pulse: (!) 110     Resp: 19     Temp: 98.2 F (36.8 C)     TempSrc: Oral     SpO2: 97%   100%  Weight:      Height:        Intake/Output Summary (Last 24 hours) at 12/27/2018 0941 Last data filed at 12/27/2018 0908 Gross per 24 hour  Intake 960 ml  Output 1450 ml  Net -490 ml   Last 3 Weights 12/26/2018 11/29/2018 11/29/2018  Weight (lbs) 210 lb 198 lb 194 lb 4.8 oz  Weight (kg) 95.255 kg 89.812 kg 88.134 kg      Telemetry    NSR 12/27/2018  - Personally Reviewed  ECG    ST rate 113 LAD no acute ST changes  - Personally Reviewed  Physical Exam  Chronically ill white male  GEN: No acute distress.   Neck: No JVD Cardiac: RRR, AS murmurs, rubs, or gallops.  Respiratory: Clear to auscultation bilaterally. GI: Soft, nontender, non-distended  MS: No edema; No deformity. Neuro:  Nonfocal  Psych: Normal affect   Labs    Chemistry Recent Labs  Lab 12/26/18 0326  NA 134*  K 4.9  CL 105  CO2 19*  GLUCOSE 208*  BUN 14  CREATININE 1.12  CALCIUM 9.2  GFRNONAA >60  GFRAA >60  ANIONGAP 10     Hematology Recent Labs  Lab 12/26/18 0326  WBC 8.2  RBC 3.27*  HGB 10.6*  HCT 32.7*   MCV 100.0  MCH 32.4  MCHC 32.4  RDW 14.1  PLT 149*    Cardiac Enzymes Recent Labs  Lab 12/26/18 0628 12/26/18 0907 12/26/18 1514 12/26/18 2209  TROPONINI 0.04* 0.04* 0.04* 0.04*    Recent Labs  Lab 12/26/18 0331 12/26/18 0637  TROPIPOC 0.02 0.02     BNPNo results for input(s): BNP, PROBNP in the last 168 hours.   DDimer No results for input(s): DDIMER in the last 168 hours.   Radiology    Dg Chest 2 View  Result Date: 12/26/2018 CLINICAL DATA:  Chest pain and shortness of breath for few hours. History of CHF and COPD. EXAM: CHEST - 2 VIEW COMPARISON:  Chest radiograph November 21, 2018 FINDINGS: Cardiomediastinal silhouette is normal. Calcified aortic arch. Similar chronic interstitial changes and increased lung volumes without pleural effusion or focal consolidation. No pneumothorax. Osteopenia. Degenerative change of the thoracic spine. Coil embolic material projecting at GE junction. IMPRESSION: 1. No acute cardiopulmonary process. 2.  Emphysema (ICD10-J43.9).  Aortic Atherosclerosis (ICD10-I70.0). Electronically Signed  By: Elon Alas M.D.   On: 12/26/2018 03:46    Cardiac Studies   TTE EF 55-60% no new RWMAls  Moderate AS mean gradient 32 mmHg   Patient Profile     John Warren is a 68 y.o.malewith a hx of CAD, aortic stenosis, HTN, DLD, hepatic cirrhosis s/p TIPS procedure, h/o GI bleed with Type 2 MI May in 2018 and chronic thrombocytopenia who is being seen today for evaluation of CP, at the request of Dr. Sloan Leiter, Internal Medicine.   Assessment & Plan    Chest Pain:  R/O no acute ECG changes see note from Dr Martinique Last cath 04/15/17 moderate disease In LAD/RCA with distal 80% circumflex Rx medically. Cirrhosis with high bleeding risk invasive cath not Recommended continue medical Rx  AS: moderate by TTE f/u TTE in a year would only consider TAVR in future not SAVR       For questions or updates, please contact [facility] Please consult  [email or web address] for contact info under        Signed, Jenkins Rouge, MD  12/27/2018, 9:41 AM

## 2018-12-27 NOTE — Progress Notes (Signed)
SATURATION QUALIFICATIONS: (This note is used to comply with regulatory documentation for home oxygen)    Patient Saturations on Room Air while Ambulating = 97%

## 2018-12-27 NOTE — Progress Notes (Signed)
Patient c/o CP 8/10 NTG SL x1 given. EKG done. Started oxygen 2L. CP resolved with NTG. Dr Lucianne Lei text paged. Cardiology informed during rounding.

## 2018-12-27 NOTE — Discharge Summary (Signed)
Physician Discharge Summary  John Warren WUJ:[id number] DOB: [birth year] DOA: 12/26/2018  PCP: Leonard Downing, MD  Admit date: 12/26/2018 Discharge date: 12/27/2018  Admitted From: home Discharge disposition: home   Recommendations for Outpatient Follow-Up:   1. May need to change PO iron to Periodic IV Fe to avoid constipation 2. Cbc, bmp 1 week   Discharge Diagnosis:   Principal Problem:   Chest pain Active Problems:   Iron deficiency anemia due to chronic blood loss   COPD (chronic obstructive pulmonary disease) (HCC)   Cirrhosis of liver with ascites (HCC)   Coronary artery disease   Chronic liver failure ([place])    Discharge Condition: Improved.  Diet recommendation: Low sodium, heart healthy  Wound care: None.  Code status: Full.   History of Present Illness:   John Warren is a 68 y.o. male with medical history significant of alcoholic liver disease, history of hepatic encephalopathy status post TIPS procedure, nonobstructive coronary artery disease, COPD, history of GI bleed, hypertension who presents to the emergency room with 1 episode of left precordial chest pain.  According to the patient, it started last night when he was turning on his left side, on his left lateral chest and radiating to the left upper arm, moderate in intensity, achy and sharp,Intermittent, on a second episode he came to the ER.  He did have old nitroglycerin about 70 years old from last time he had a cardiac problem and took that nitroglycerin with improvement.  He had some shortness of breath worse than usual yesterday but denies any other symptoms.  By the time he is in the emergency room, he is chest pain-free.  Denies any cough, cold, flulike symptoms.  Denies any trauma. ED Course: Hemodynamically stable in the emergency room.  Electrolytes are normal.  Initial troponins are nonischemic.  Twelve-lead EKG is nonischemic.  Patient currently chest pain-free.Chest x-ray is  essentially normal.  Due to significant history, patient was assessed for admission and to rule out acute coronary syndrome.   Hospital Course by Problem:   Chest pain: -atypical chest pain.  -cards consult: no acute ECG changes; Last cath 04/15/17 moderate disease In LAD/RCA with distal 80% circumflex Rx medically. Cirrhosis with high bleeding risk invasive cath not Recommended continue medical Rx AS: moderate by TTE f/u TTE in a year would only consider TAVR in future not SAVR  Alcoholic liver cirrhosis status post TIPS, chronic liver disease with history of hepatic encephalopathy: Patient currently neurologically stable.  He is on lactulose at home, does not have as much bowel movement as needed.   -  Apparently, he was advised to stop rifaximin as that was causing questionable constipation. -titration of lactulose  COPD: Fairly stable.  Bronchodilator as needed.  Anemia- ? Chronic disease/Fe def -outpatient work up -denies blood in stools     Medical Consultants:   cards   Discharge Exam:   Vitals:   12/27/18 1232 12/27/18 1700  BP: (!) 98/51 (!) 110/52  Pulse: (!) 113 (!) 114  Resp: 18 18  Temp: 97.8 F (36.6 C) 98 F (36.7 C)  SpO2: 98% 100%   Vitals:   12/27/18 0927 12/27/18 0935 12/27/18 1232 12/27/18 1700  BP: (!) 88/49 113/62 (!) 98/51 (!) 110/52  Pulse:   (!) 113 (!) 114  Resp:   18 18  Temp:   97.8 F (36.6 C) 98 F (36.7 C)  TempSrc:   Oral Oral  SpO2:  100% 98% 100%  Weight:      Height:        General exam: Appears calm and comfortable. Anxious to go home- per wife at bedside this is his baseline    The results of significant diagnostics from this hospitalization (including imaging, microbiology, ancillary and laboratory) are listed below for reference.     Procedures and Diagnostic Studies:   Dg Chest 2 View  Result Date: 12/26/2018 CLINICAL DATA:  Chest pain and shortness of breath for few hours. History of CHF and COPD. EXAM:  CHEST - 2 VIEW COMPARISON:  Chest radiograph November 21, 2018 FINDINGS: Cardiomediastinal silhouette is normal. Calcified aortic arch. Similar chronic interstitial changes and increased lung volumes without pleural effusion or focal consolidation. No pneumothorax. Osteopenia. Degenerative change of the thoracic spine. Coil embolic material projecting at GE junction. IMPRESSION: 1. No acute cardiopulmonary process. 2.  Emphysema (ICD10-J43.9).  Aortic Atherosclerosis (ICD10-I70.0). Electronically Signed   By: Elon Alas M.D.   On: 12/26/2018 03:46     Labs:   Basic Metabolic Panel: Recent Labs  Lab 12/26/18 0326  NA 134*  K 4.9  CL 105  CO2 19*  GLUCOSE 208*  BUN 14  CREATININE 1.12  CALCIUM 9.2   GFR Estimated Creatinine Clearance: 74.6 mL/min (by C-G formula based on SCr of 1.12 mg/dL). Liver Function Tests: No results for input(s): AST, ALT, ALKPHOS, BILITOT, PROT, ALBUMIN in the last 168 hours. No results for input(s): LIPASE, AMYLASE in the last 168 hours. No results for input(s): AMMONIA in the last 168 hours. Coagulation profile No results for input(s): INR, PROTIME in the last 168 hours.  CBC: Recent Labs  Lab 12/26/18 0326  WBC 8.2  HGB 10.6*  HCT 32.7*  MCV 100.0  PLT 149*   Cardiac Enzymes: Recent Labs  Lab 12/26/18 0628 12/26/18 0907 12/26/18 1514 12/26/18 2209  TROPONINI 0.04* 0.04* 0.04* 0.04*   BNP: Invalid input(s): POCBNP CBG: No results for input(s): GLUCAP in the last 168 hours. D-Dimer No results for input(s): DDIMER in the last 72 hours. Hgb A1c No results for input(s): HGBA1C in the last 72 hours. Lipid Profile No results for input(s): CHOL, HDL, LDLCALC, TRIG, CHOLHDL, LDLDIRECT in the last 72 hours. Thyroid function studies No results for input(s): TSH, T4TOTAL, T3FREE, THYROIDAB in the last 72 hours.  Invalid input(s): FREET3 Anemia work up No results for input(s): VITAMINB12, FOLATE, FERRITIN, TIBC, IRON, RETICCTPCT in the  last 72 hours. Microbiology No results found for this or any previous visit (from the past 240 hour(s)).   Discharge Instructions:   Discharge Instructions    Diet - low sodium heart healthy   Complete by:  As directed    Discharge instructions   Complete by:  As directed    Lactulose needs to be titrated to have 3-4 bowel movements/day (this is how your body rids itself of ammonia)   Increase activity slowly   Complete by:  As directed      Allergies as of 12/27/2018      Reactions   Penicillins Rash   Has patient had a PCN reaction causing immediate rash, facial/tongue/throat swelling, SOB or lightheadedness with hypotension: Yes Has patient had a PCN reaction causing severe rash involving mucus membranes or skin necrosis: No Has patient had a PCN reaction that required hospitalization: No Has patient had a PCN reaction occurring within the last 10 years: No If all of the above answers are "NO", then may proceed with Cephalosporin use. Had CTX before  Medication List    TAKE these medications   acetaminophen 500 MG tablet Commonly known as:  TYLENOL Take 500-1,000 mg by mouth every 8 (eight) hours as needed (for pain).   aspirin EC 81 MG tablet Take 81 mg by mouth daily.   atorvastatin 20 MG tablet Commonly known as:  LIPITOR Take 1 tablet (20 mg total) by mouth daily.   famotidine 20 MG tablet Commonly known as:  PEPCID Take 20 mg by mouth daily.   ferrous sulfate 325 (65 FE) MG tablet Take 1 tablet (325 mg total) by mouth 2 (two) times daily with a meal.   folic acid 1 MG tablet Commonly known as:  FOLVITE Take 1 tablet (1 mg total) by mouth daily.   furosemide 80 MG tablet Commonly known as:  LASIX Take 80 mg by mouth daily.   gabapentin 100 MG capsule Commonly known as:  NEURONTIN Take 1 capsule (100 mg total) by mouth at bedtime.   lactulose 10 GM/15ML solution Commonly known as:  CHRONULAC Take 45 mLs (30 g total) by mouth 3 (three) times  daily.   nitroGLYCERIN 0.4 MG SL tablet Commonly known as:  NITROSTAT Place 0.4 mg under the tongue every 5 (five) minutes as needed for chest pain.   nortriptyline 10 MG capsule Commonly known as:  PAMELOR Take 10 mg by mouth daily.   omeprazole 40 MG capsule Commonly known as:  PRILOSEC Take 40 mg by mouth daily.      Follow-up Information    Leonard Downing, MD Follow up in 1 week(s).   Specialty:  Family Medicine Why:  CBC Contact information: [place]  [phone]            Time coordinating discharge: 25 min  Signed:  Geradine Girt DO  [facility] 12/27/2018, 5:50 PM

## 2019-01-12 ENCOUNTER — Telehealth: Payer: Self-pay | Admitting: Diagnostic Neuroimaging

## 2019-01-12 NOTE — Telephone Encounter (Signed)
John Warren (on DPR) called to inform us that the pt has deceased.

## 2019-01-12 NOTE — Telephone Encounter (Signed)
Please inform [facility] records and update [facility]. -VRP

## 2019-01-27 IMAGING — CT CT ABD-PEL WO/W CM
3 of 7 series · 8 of 46 positions shown, 15 images · IV contrast (APPLIED)
Comparison: 03/30/2017

CLINICAL DATA: 66-year-old male with a history of gastric varices
and portal hypertension, status post balloon occluded trans venous
obliteration of varices with TIPS.

EXAM:
CT ABDOMEN AND PELVIS WITHOUT AND WITH CONTRAST
TECHNIQUE: Multidetector CT imaging of the abdomen and pelvis was performed
following the standard protocol before and following the bolus
administration of intravenous contrast.
CONTRAST:  100mL [id number] IOPAMIDOL (XJD14O-2UU) INJECTION 61%

[Series 5: venous phase 5.0 i30f 2 · axial · portal-venous · 0.84mm/px · z∈[+1202,+1512]mm · 4 of 104 slices shown, 9 images]
[im 21/104  soft-tissue]
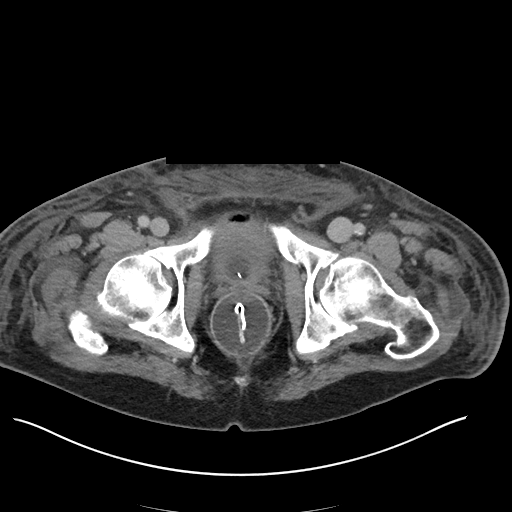
[im 21/104  lung]
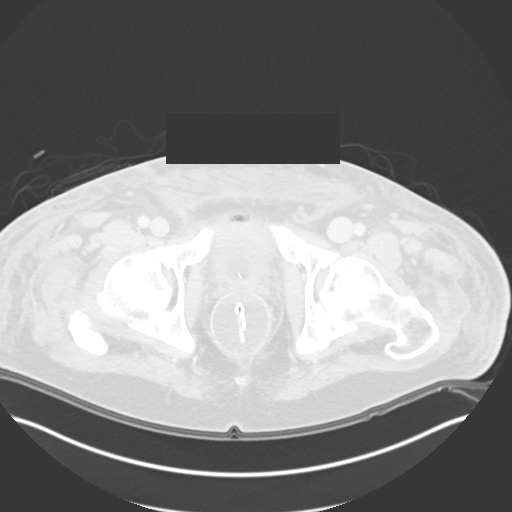
[im 21/104  bone]
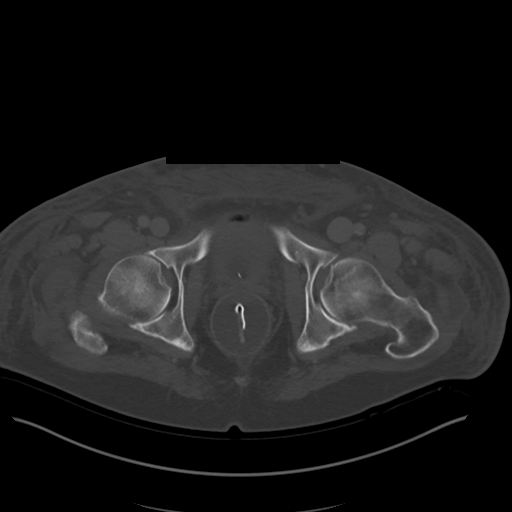
[im 42/104  soft-tissue]
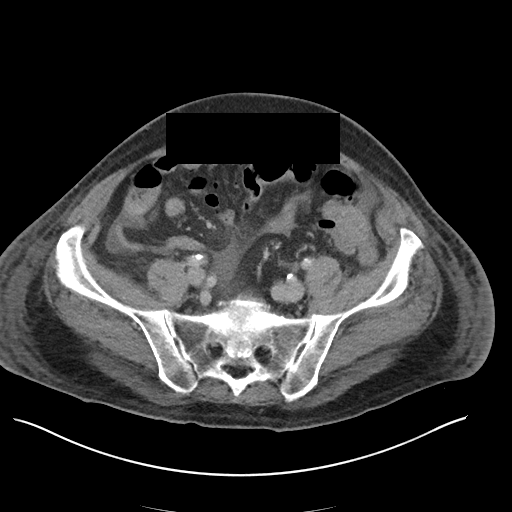
[im 42/104  lung]
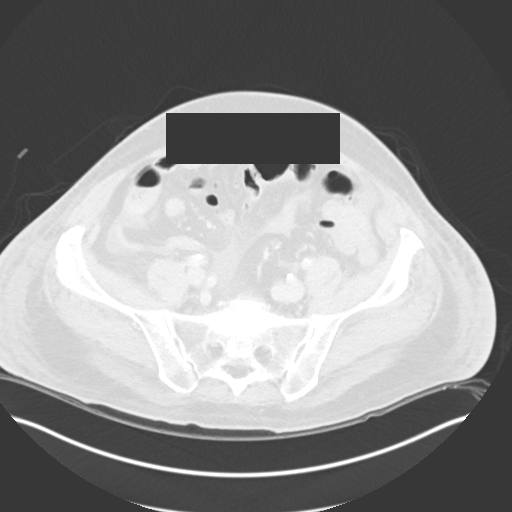
[im 62/104  soft-tissue]
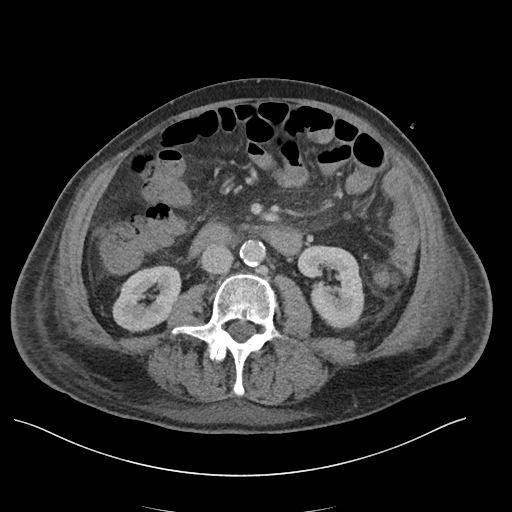
[im 62/104  lung]
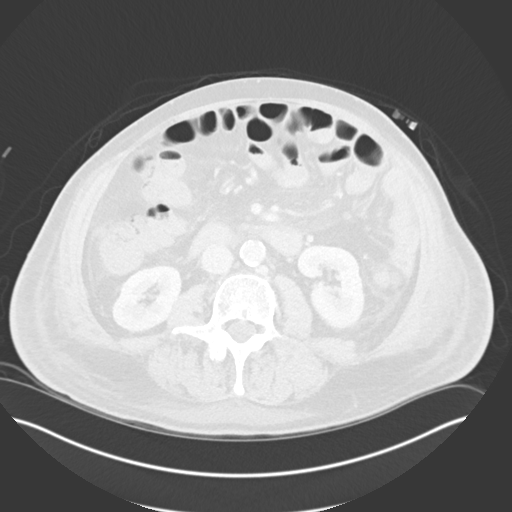
[im 83/104  soft-tissue]
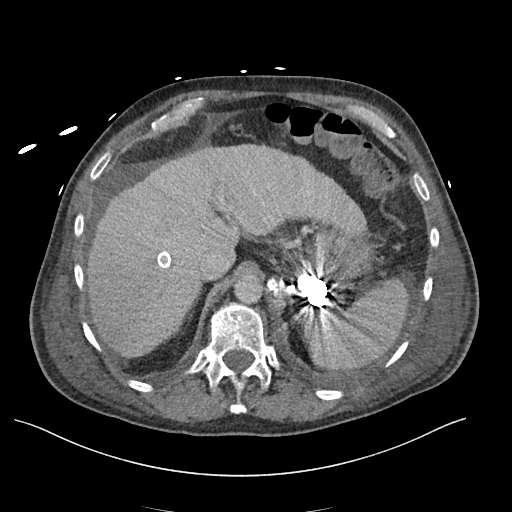
[im 83/104  lung]
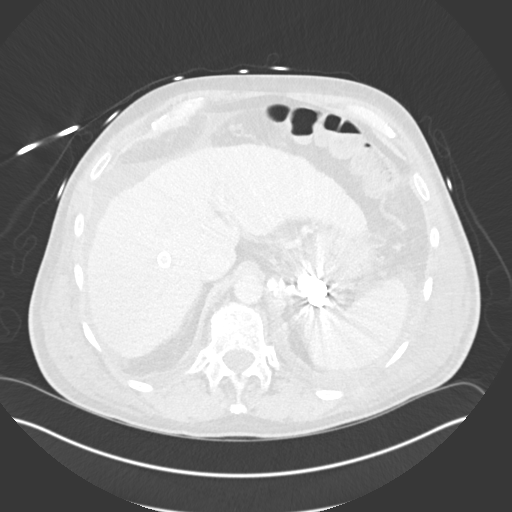

[Series 9: coronal venous · coronal · portal-venous · 0.89mm/px · 3 of 95 slices shown, 4 images]
[im 32/95  soft-tissue]
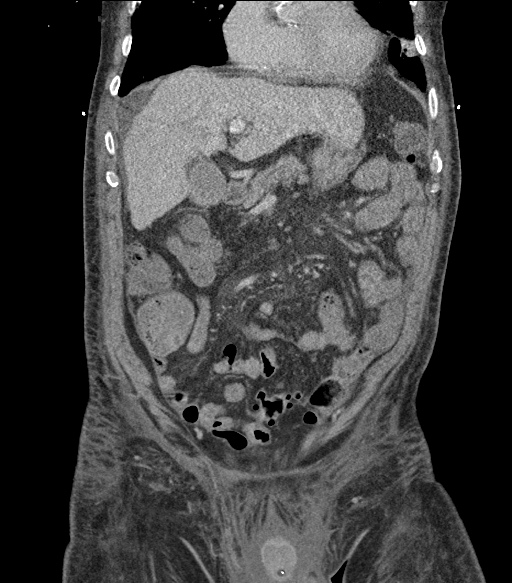
[im 42/95  soft-tissue]
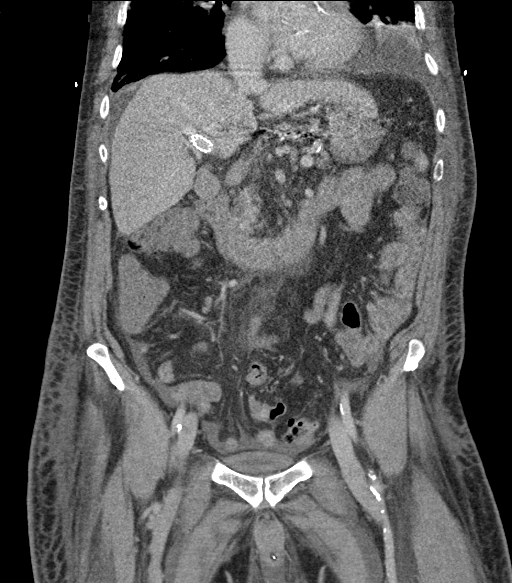
[im 42/95  bone]
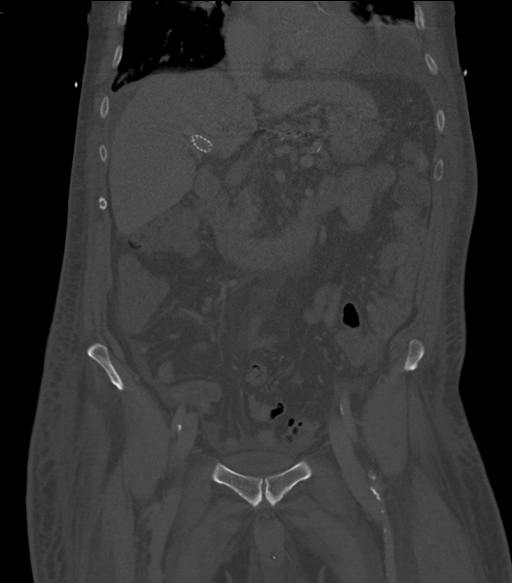
[im 53/95  soft-tissue]
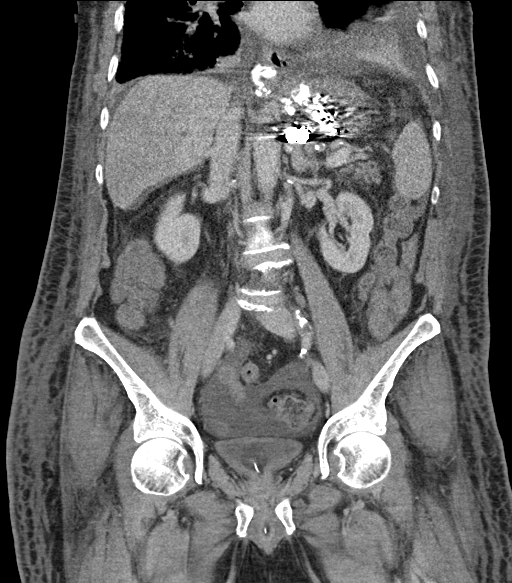

[Series 10: sagittal venous · sagittal · portal-venous · 0.82mm/px · 1 of 123 slices shown, 2 images]
[im 41/123  soft-tissue]
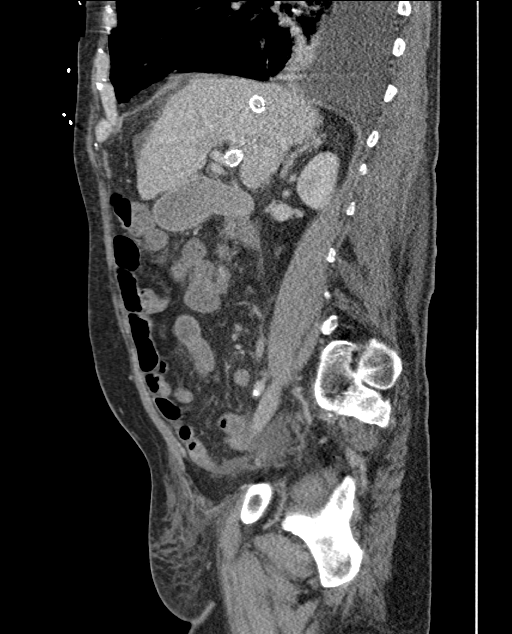
[im 41/123  bone]
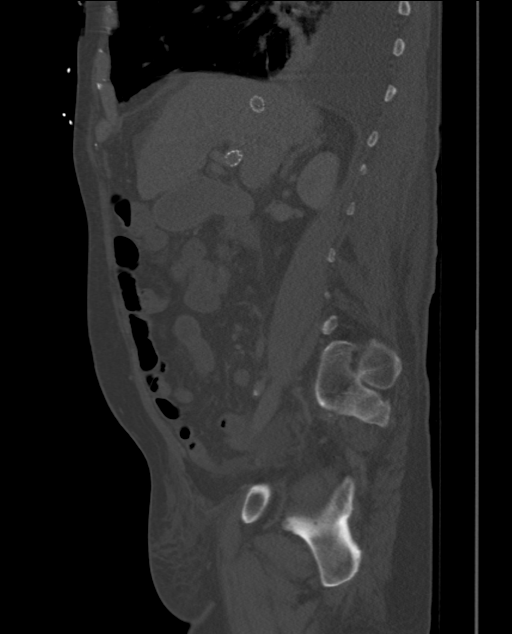

[8 of 46 positions shown; findings below may reference images not displayed]

FINDINGS: Lower chest: Pleural effusions bilateral lung bases. Associated
atelectasis/ consolidation.

Calcifications of the aortic valve. Native coronary calcifications.
Bilateral gynecomastia

Hepatobiliary: Interval changes of TIPS shunt which appears patent
on the CT study.

Similar configuration of the liver with nodular contour and
decreasing volume with preferential enlargement of caudate.
Differential enhancement of the posterior segment of the right
liver, likely flow related given the new shunt.

No evidence of pericholecystic fluid or inflammatory changes. High
density material within the gallbladder either vicarious excretion
of contrast or potentially biliary sludge/ stones.

Pancreas: Unremarkable appearance of pancreas.

Spleen: Unremarkable spleen.

Adrenals/Urinary Tract: Unremarkable adrenal glands.

No evidence of left-sided or right-sided hydronephrosis. No
perinephric stranding on the left with the right. No
nephrolithiasis.

There is a focal region of differential enhancement on the lateral
cortex of the right kidney which is new from the comparison.

Unremarkable appearance of urinary bladder with catheter in
position.

Stomach/Bowel: Post treatment effects of embolization of the gastric
varices with streak artifact at the level of the stomach extending
into the esophageal varices system.

No abnormally distended small bowel.  No transition point.

Rectus seal. Diverticular disease without evidence of focal
inflammatory changes. No colonic wall thickening. Normal appendix.

Lymphatic:

Low-density free fluid within the pelvis. Mesenteric infiltration at
the base of the mesenteric with trace free fluid on the liver
margin. Trace free fluid in the left upper quadrant adjacent to the
spleen. No significant adenopathy.

Body wall anasarca/ edema including evidence of scrotal swelling on
the inferior images.

Vasculature: Vascular calcifications of the abdominal aorta
including mesenteric vessels and bilateral renal arteries. Proximal
femoral vasculature is patent.

Interval treatment changes of embolization of the portal to systemic
shunt system with streak artifact in the gastric an esophageal
varices.

No evidence of persisting opacification of varices in the stomach.
No evidence of nontarget embolization.

Reproductive: Unremarkable appearance of the pelvic organs.

Other: Abdominal wall anasarca/edema

Musculoskeletal: No displaced fracture. Multilevel degenerative
changes of the lumbar spine. Vacuum disc phenomenon at L4-L5.
IMPRESSION: Interval treatment changes of TIPS and embolization of gastric and
esophageal varices. No evidence of persisting varices at the stomach
or distal esophagus.

Body wall and mesenteric anasarca/edema, with bilateral pleural
effusions and small volume ascites in the pelvis. There is also
evidence of scrotal edema, incompletely imaged.

Focal differential enhancement of the lateral cortex of the right
kidney, potentially flow related, or alternatively could represent a
new renal infarction or renal infection. If there is concern for
urinary tract infection, correlation with urinalysis may be useful.

Cirrhotic liver changes. Differential geographic
attenuation/enhancement of the posterior lobes of the right liver is
favored to be a flow phenomenon given the new shunt.

Aortic atherosclerosis and coronary artery disease.

## 2019-02-01 DEATH — deceased

## 2019-05-19 ENCOUNTER — Ambulatory Visit: Payer: Medicare Other | Admitting: Diagnostic Neuroimaging
